# Patient Record
Sex: Female | Born: 1945
Health system: Southern US, Community
[De-identification: ages and names within clinical notes are randomized; demographics above are authoritative.]

## PROBLEM LIST (undated history)

## (undated) DIAGNOSIS — I10 Essential (primary) hypertension: Secondary | ICD-10-CM

## (undated) DIAGNOSIS — Z923 Personal history of irradiation: Secondary | ICD-10-CM

## (undated) DIAGNOSIS — K219 Gastro-esophageal reflux disease without esophagitis: Secondary | ICD-10-CM

## (undated) DIAGNOSIS — T783XXA Angioneurotic edema, initial encounter: Secondary | ICD-10-CM

## (undated) DIAGNOSIS — R7309 Other abnormal glucose: Secondary | ICD-10-CM

## (undated) DIAGNOSIS — I6529 Occlusion and stenosis of unspecified carotid artery: Secondary | ICD-10-CM

## (undated) DIAGNOSIS — C801 Malignant (primary) neoplasm, unspecified: Secondary | ICD-10-CM

## (undated) DIAGNOSIS — I739 Peripheral vascular disease, unspecified: Secondary | ICD-10-CM

## (undated) DIAGNOSIS — Z853 Personal history of malignant neoplasm of breast: Secondary | ICD-10-CM

## (undated) DIAGNOSIS — E785 Hyperlipidemia, unspecified: Secondary | ICD-10-CM

## (undated) DIAGNOSIS — N912 Amenorrhea, unspecified: Secondary | ICD-10-CM

## (undated) DIAGNOSIS — N649 Disorder of breast, unspecified: Secondary | ICD-10-CM

## (undated) DIAGNOSIS — N189 Chronic kidney disease, unspecified: Secondary | ICD-10-CM

## (undated) DIAGNOSIS — Z8679 Personal history of other diseases of the circulatory system: Secondary | ICD-10-CM

## (undated) DIAGNOSIS — J189 Pneumonia, unspecified organism: Secondary | ICD-10-CM

## (undated) DIAGNOSIS — R7303 Prediabetes: Secondary | ICD-10-CM

## (undated) DIAGNOSIS — C50919 Malignant neoplasm of unspecified site of unspecified female breast: Secondary | ICD-10-CM

## (undated) HISTORY — DX: Personal history of malignant neoplasm of breast: Z85.3

## (undated) HISTORY — DX: Other abnormal glucose: R73.09

## (undated) HISTORY — DX: Occlusion and stenosis of unspecified carotid artery: I65.29

## (undated) HISTORY — DX: Disorder of breast, unspecified: N64.9

## (undated) HISTORY — DX: Personal history of other diseases of the circulatory system: Z86.79

## (undated) HISTORY — DX: Amenorrhea, unspecified: N91.2

## (undated) HISTORY — DX: Angioneurotic edema, initial encounter: T78.3XXA

## (undated) HISTORY — DX: Hyperlipidemia, unspecified: E78.5

## (undated) HISTORY — PX: OTHER SURGICAL HISTORY: SHX169

## (undated) HISTORY — DX: Essential (primary) hypertension: I10

---

## 1970-04-09 HISTORY — PX: APPENDECTOMY: SHX54

## 1999-01-25 ENCOUNTER — Other Ambulatory Visit: Admission: RE | Admit: 1999-01-25 | Discharge: 1999-01-25 | Payer: Self-pay | Admitting: Obstetrics & Gynecology

## 2001-02-04 ENCOUNTER — Other Ambulatory Visit: Admission: RE | Admit: 2001-02-04 | Discharge: 2001-02-04 | Payer: Self-pay | Admitting: Obstetrics & Gynecology

## 2002-04-22 ENCOUNTER — Other Ambulatory Visit: Admission: RE | Admit: 2002-04-22 | Discharge: 2002-04-22 | Payer: Self-pay | Admitting: Obstetrics & Gynecology

## 2003-02-15 ENCOUNTER — Ambulatory Visit (HOSPITAL_COMMUNITY): Admission: RE | Admit: 2003-02-15 | Discharge: 2003-02-15 | Payer: Self-pay | Admitting: Gastroenterology

## 2003-04-10 HISTORY — PX: COLONOSCOPY: SHX174

## 2003-07-19 ENCOUNTER — Other Ambulatory Visit: Admission: RE | Admit: 2003-07-19 | Discharge: 2003-07-19 | Payer: Self-pay | Admitting: Obstetrics & Gynecology

## 2004-08-10 ENCOUNTER — Ambulatory Visit: Payer: Self-pay | Admitting: Internal Medicine

## 2004-08-11 ENCOUNTER — Ambulatory Visit: Payer: Self-pay | Admitting: Internal Medicine

## 2004-08-18 ENCOUNTER — Ambulatory Visit: Payer: Self-pay

## 2004-08-22 ENCOUNTER — Ambulatory Visit: Payer: Self-pay

## 2005-02-20 ENCOUNTER — Ambulatory Visit: Payer: Self-pay

## 2005-04-09 DIAGNOSIS — C801 Malignant (primary) neoplasm, unspecified: Secondary | ICD-10-CM

## 2005-04-09 DIAGNOSIS — Z923 Personal history of irradiation: Secondary | ICD-10-CM

## 2005-04-09 HISTORY — DX: Personal history of irradiation: Z92.3

## 2005-04-09 HISTORY — DX: Malignant (primary) neoplasm, unspecified: C80.1

## 2005-04-24 ENCOUNTER — Encounter: Admission: RE | Admit: 2005-04-24 | Discharge: 2005-04-24 | Payer: Self-pay | Admitting: Obstetrics and Gynecology

## 2005-08-21 ENCOUNTER — Ambulatory Visit: Payer: Self-pay

## 2005-11-30 ENCOUNTER — Encounter: Admission: RE | Admit: 2005-11-30 | Discharge: 2005-11-30 | Payer: Self-pay | Admitting: General Surgery

## 2005-11-30 ENCOUNTER — Encounter (INDEPENDENT_AMBULATORY_CARE_PROVIDER_SITE_OTHER): Payer: Self-pay | Admitting: Specialist

## 2005-12-03 ENCOUNTER — Encounter (INDEPENDENT_AMBULATORY_CARE_PROVIDER_SITE_OTHER): Payer: Self-pay | Admitting: *Deleted

## 2005-12-03 ENCOUNTER — Encounter (INDEPENDENT_AMBULATORY_CARE_PROVIDER_SITE_OTHER): Payer: Self-pay | Admitting: Diagnostic Radiology

## 2005-12-03 ENCOUNTER — Encounter: Admission: RE | Admit: 2005-12-03 | Discharge: 2005-12-03 | Payer: Self-pay | Admitting: General Surgery

## 2005-12-06 HISTORY — PX: BREAST BIOPSY: SHX20

## 2005-12-12 ENCOUNTER — Encounter: Admission: RE | Admit: 2005-12-12 | Discharge: 2005-12-12 | Payer: Self-pay | Admitting: General Surgery

## 2005-12-19 ENCOUNTER — Encounter: Admission: RE | Admit: 2005-12-19 | Discharge: 2005-12-19 | Payer: Self-pay | Admitting: General Surgery

## 2005-12-20 ENCOUNTER — Encounter: Admission: RE | Admit: 2005-12-20 | Discharge: 2005-12-20 | Payer: Self-pay | Admitting: General Surgery

## 2005-12-20 ENCOUNTER — Encounter (INDEPENDENT_AMBULATORY_CARE_PROVIDER_SITE_OTHER): Payer: Self-pay | Admitting: Specialist

## 2005-12-24 ENCOUNTER — Encounter (INDEPENDENT_AMBULATORY_CARE_PROVIDER_SITE_OTHER): Payer: Self-pay | Admitting: Specialist

## 2005-12-24 ENCOUNTER — Ambulatory Visit (HOSPITAL_BASED_OUTPATIENT_CLINIC_OR_DEPARTMENT_OTHER): Admission: RE | Admit: 2005-12-24 | Discharge: 2005-12-24 | Payer: Self-pay | Admitting: General Surgery

## 2005-12-24 ENCOUNTER — Encounter: Admission: RE | Admit: 2005-12-24 | Discharge: 2005-12-24 | Payer: Self-pay | Admitting: General Surgery

## 2005-12-26 ENCOUNTER — Ambulatory Visit: Payer: Self-pay | Admitting: Oncology

## 2006-01-04 ENCOUNTER — Ambulatory Visit: Admission: RE | Admit: 2006-01-04 | Discharge: 2006-04-04 | Payer: Self-pay | Admitting: Radiation Oncology

## 2006-01-08 HISTORY — PX: BREAST LUMPECTOMY: SHX2

## 2006-02-11 ENCOUNTER — Encounter: Admission: RE | Admit: 2006-02-11 | Discharge: 2006-02-11 | Payer: Self-pay | Admitting: Oncology

## 2006-02-19 ENCOUNTER — Ambulatory Visit: Payer: Self-pay

## 2006-02-20 ENCOUNTER — Ambulatory Visit: Payer: Self-pay | Admitting: Oncology

## 2006-02-25 LAB — CBC WITH DIFFERENTIAL/PLATELET
Basophils Absolute: 0.1 10*3/uL (ref 0.0–0.1)
EOS%: 1.1 % (ref 0.0–7.0)
HGB: 11.8 g/dL (ref 11.6–15.9)
LYMPH%: 46.2 % (ref 14.0–48.0)
MCH: 27.6 pg (ref 26.0–34.0)
MCV: 83.8 fL (ref 81.0–101.0)
MONO%: 5.2 % (ref 0.0–13.0)
RBC: 4.28 10*6/uL (ref 3.70–5.32)
RDW: 14.6 % — ABNORMAL HIGH (ref 11.3–14.5)

## 2006-03-08 LAB — COMPREHENSIVE METABOLIC PANEL
AST: 12 U/L (ref 0–37)
Albumin: 4.6 g/dL (ref 3.5–5.2)
Alkaline Phosphatase: 96 U/L (ref 39–117)
BUN: 13 mg/dL (ref 6–23)
Potassium: 4.1 mEq/L (ref 3.5–5.3)
Total Bilirubin: 0.3 mg/dL (ref 0.3–1.2)

## 2006-03-08 LAB — CANCER ANTIGEN 27.29: CA 27.29: 10 U/mL (ref 0–39)

## 2006-03-08 LAB — FOLLICLE STIMULATING HORMONE: FSH: 37.6 m[IU]/mL

## 2006-04-05 ENCOUNTER — Ambulatory Visit: Admission: RE | Admit: 2006-04-05 | Discharge: 2006-07-04 | Payer: Self-pay | Admitting: Radiation Oncology

## 2006-04-25 ENCOUNTER — Ambulatory Visit: Payer: Self-pay | Admitting: Oncology

## 2006-05-03 ENCOUNTER — Ambulatory Visit: Payer: Self-pay | Admitting: Internal Medicine

## 2006-05-30 ENCOUNTER — Ambulatory Visit: Payer: Self-pay | Admitting: Internal Medicine

## 2006-06-17 ENCOUNTER — Ambulatory Visit: Payer: Self-pay | Admitting: Oncology

## 2006-06-20 LAB — CBC WITH DIFFERENTIAL/PLATELET
Basophils Absolute: 0 10*3/uL (ref 0.0–0.1)
EOS%: 0.8 % (ref 0.0–7.0)
Eosinophils Absolute: 0.1 10*3/uL (ref 0.0–0.5)
LYMPH%: 29.8 % (ref 14.0–48.0)
MCH: 28.4 pg (ref 26.0–34.0)
MCV: 83.2 fL (ref 81.0–101.0)
MONO%: 6.1 % (ref 0.0–13.0)
NEUT#: 4.8 10*3/uL (ref 1.5–6.5)
Platelets: 292 10*3/uL (ref 145–400)
RBC: 4.48 10*6/uL (ref 3.70–5.32)

## 2006-06-20 LAB — COMPREHENSIVE METABOLIC PANEL
AST: 15 U/L (ref 0–37)
Alkaline Phosphatase: 96 U/L (ref 39–117)
BUN: 17 mg/dL (ref 6–23)
Glucose, Bld: 101 mg/dL — ABNORMAL HIGH (ref 70–99)
Sodium: 140 mEq/L (ref 135–145)
Total Bilirubin: 0.4 mg/dL (ref 0.3–1.2)

## 2006-08-08 ENCOUNTER — Ambulatory Visit: Payer: Self-pay

## 2006-09-10 ENCOUNTER — Ambulatory Visit: Payer: Self-pay | Admitting: Oncology

## 2006-09-12 LAB — COMPREHENSIVE METABOLIC PANEL
AST: 12 U/L (ref 0–37)
Albumin: 4.6 g/dL (ref 3.5–5.2)
Alkaline Phosphatase: 94 U/L (ref 39–117)
Potassium: 4.1 mEq/L (ref 3.5–5.3)
Sodium: 139 mEq/L (ref 135–145)
Total Protein: 8.1 g/dL (ref 6.0–8.3)

## 2006-09-12 LAB — CBC WITH DIFFERENTIAL/PLATELET
BASO%: 2.1 % — ABNORMAL HIGH (ref 0.0–2.0)
EOS%: 1.3 % (ref 0.0–7.0)
MCH: 28.7 pg (ref 26.0–34.0)
MCHC: 33.8 g/dL (ref 32.0–36.0)
MCV: 84.7 fL (ref 81.0–101.0)
MONO%: 7 % (ref 0.0–13.0)
NEUT#: 3.6 10*3/uL (ref 1.5–6.5)
RBC: 4.33 10*6/uL (ref 3.70–5.32)
RDW: 13.3 % (ref 11.3–14.5)

## 2006-09-19 ENCOUNTER — Encounter: Payer: Self-pay | Admitting: Internal Medicine

## 2006-11-25 ENCOUNTER — Encounter: Admission: RE | Admit: 2006-11-25 | Discharge: 2006-11-25 | Payer: Self-pay | Admitting: Radiation Oncology

## 2007-02-21 DIAGNOSIS — N912 Amenorrhea, unspecified: Secondary | ICD-10-CM | POA: Insufficient documentation

## 2007-02-21 DIAGNOSIS — Z8679 Personal history of other diseases of the circulatory system: Secondary | ICD-10-CM | POA: Insufficient documentation

## 2007-02-25 ENCOUNTER — Ambulatory Visit: Payer: Self-pay | Admitting: Internal Medicine

## 2007-02-25 DIAGNOSIS — Z853 Personal history of malignant neoplasm of breast: Secondary | ICD-10-CM | POA: Insufficient documentation

## 2007-02-25 LAB — CONVERTED CEMR LAB: HDL goal, serum: 40 mg/dL

## 2007-03-05 ENCOUNTER — Ambulatory Visit: Payer: Self-pay | Admitting: Oncology

## 2007-03-11 ENCOUNTER — Encounter: Payer: Self-pay | Admitting: Internal Medicine

## 2007-03-11 LAB — CBC WITH DIFFERENTIAL/PLATELET
Basophils Absolute: 0 10*3/uL (ref 0.0–0.1)
EOS%: 0.8 % (ref 0.0–7.0)
Eosinophils Absolute: 0.1 10*3/uL (ref 0.0–0.5)
HGB: 12.6 g/dL (ref 11.6–15.9)
LYMPH%: 40.8 % (ref 14.0–48.0)
MCH: 28.8 pg (ref 26.0–34.0)
MCV: 84.3 fL (ref 81.0–101.0)
MONO%: 4.7 % (ref 0.0–13.0)
NEUT#: 4.2 10*3/uL (ref 1.5–6.5)
Platelets: 248 10*3/uL (ref 145–400)
RBC: 4.36 10*6/uL (ref 3.70–5.32)

## 2007-03-11 LAB — COMPREHENSIVE METABOLIC PANEL
Alkaline Phosphatase: 102 U/L (ref 39–117)
BUN: 18 mg/dL (ref 6–23)
Glucose, Bld: 96 mg/dL (ref 70–99)
Total Bilirubin: 0.4 mg/dL (ref 0.3–1.2)

## 2007-03-17 ENCOUNTER — Encounter: Payer: Self-pay | Admitting: Internal Medicine

## 2007-03-17 LAB — CONVERTED CEMR LAB
BUN: 18 mg/dL (ref 6–23)
HDL: 62 mg/dL (ref >39)
LDL Cholesterol: 136 mg/dL — ABNORMAL HIGH (ref 0–99)
Total CHOL/HDL Ratio: 3.5
VLDL: 22 mg/dL (ref 0–40)

## 2007-03-20 LAB — ESTRADIOL, ULTRA SENS: Estradiol, Ultra Sensitive: 12 pg/mL

## 2007-03-31 ENCOUNTER — Telehealth (INDEPENDENT_AMBULATORY_CARE_PROVIDER_SITE_OTHER): Payer: Self-pay | Admitting: *Deleted

## 2007-06-03 ENCOUNTER — Ambulatory Visit: Payer: Self-pay | Admitting: Internal Medicine

## 2007-06-07 LAB — CONVERTED CEMR LAB
AST: 16 units/L (ref 0–37)
Cholesterol: 135 mg/dL (ref 0–200)
HDL: 52.3 mg/dL (ref >39.0)
LDL Cholesterol: 63 mg/dL (ref 0–99)

## 2007-06-10 ENCOUNTER — Encounter (INDEPENDENT_AMBULATORY_CARE_PROVIDER_SITE_OTHER): Payer: Self-pay | Admitting: *Deleted

## 2007-06-12 ENCOUNTER — Ambulatory Visit: Payer: Self-pay | Admitting: Internal Medicine

## 2007-06-12 DIAGNOSIS — I10 Essential (primary) hypertension: Secondary | ICD-10-CM | POA: Insufficient documentation

## 2007-06-12 LAB — CONVERTED CEMR LAB
Blood in Urine, dipstick: NEGATIVE
Glucose, Urine, Semiquant: NEGATIVE
Nitrite: NEGATIVE
Protein, U semiquant: 30
Specific Gravity, Urine: 1.015

## 2007-06-13 ENCOUNTER — Encounter: Payer: Self-pay | Admitting: Internal Medicine

## 2007-06-16 ENCOUNTER — Encounter (INDEPENDENT_AMBULATORY_CARE_PROVIDER_SITE_OTHER): Payer: Self-pay | Admitting: *Deleted

## 2007-06-17 ENCOUNTER — Ambulatory Visit: Payer: Self-pay | Admitting: Internal Medicine

## 2007-06-17 DIAGNOSIS — I6529 Occlusion and stenosis of unspecified carotid artery: Secondary | ICD-10-CM | POA: Insufficient documentation

## 2007-06-18 ENCOUNTER — Encounter (INDEPENDENT_AMBULATORY_CARE_PROVIDER_SITE_OTHER): Payer: Self-pay | Admitting: *Deleted

## 2007-06-27 ENCOUNTER — Ambulatory Visit: Payer: Self-pay | Admitting: Cardiovascular Disease

## 2007-07-02 ENCOUNTER — Ambulatory Visit: Payer: Self-pay

## 2007-08-08 ENCOUNTER — Ambulatory Visit: Payer: Self-pay | Admitting: Oncology

## 2007-08-12 LAB — CBC WITH DIFFERENTIAL/PLATELET
Basophils Absolute: 0 10*3/uL (ref 0.0–0.1)
Eosinophils Absolute: 0.1 10*3/uL (ref 0.0–0.5)
HCT: 34.8 % (ref 34.8–46.6)
HGB: 11.8 g/dL (ref 11.6–15.9)
LYMPH%: 32.1 % (ref 14.0–48.0)
MCV: 84.4 fL (ref 81.0–101.0)
MONO%: 5 % (ref 0.0–13.0)
NEUT#: 5.1 10*3/uL (ref 1.5–6.5)
NEUT%: 61.2 % (ref 39.6–76.8)
Platelets: 254 10*3/uL (ref 145–400)

## 2007-08-13 LAB — COMPREHENSIVE METABOLIC PANEL
ALT: 9 U/L (ref 0–35)
CO2: 27 mEq/L (ref 19–32)
Calcium: 10 mg/dL (ref 8.4–10.5)
Chloride: 104 mEq/L (ref 96–112)
Sodium: 143 mEq/L (ref 135–145)
Total Protein: 7.6 g/dL (ref 6.0–8.3)

## 2007-08-13 LAB — CANCER ANTIGEN 27.29: CA 27.29: 12 U/mL (ref 0–39)

## 2007-08-14 ENCOUNTER — Ambulatory Visit (HOSPITAL_COMMUNITY): Admission: RE | Admit: 2007-08-14 | Discharge: 2007-08-14 | Payer: Self-pay | Admitting: Oncology

## 2007-10-28 ENCOUNTER — Ambulatory Visit: Payer: Self-pay | Admitting: Internal Medicine

## 2007-10-28 LAB — CONVERTED CEMR LAB: Specific Gravity, Urine: 1.015

## 2007-10-31 ENCOUNTER — Telehealth (INDEPENDENT_AMBULATORY_CARE_PROVIDER_SITE_OTHER): Payer: Self-pay | Admitting: *Deleted

## 2007-11-26 ENCOUNTER — Encounter: Admission: RE | Admit: 2007-11-26 | Discharge: 2007-11-26 | Payer: Self-pay | Admitting: General Surgery

## 2007-12-14 IMAGING — MG MM BREAST STEREO BIOPSY*L*
2 series · 2 of 2 positions shown · non-contrast
Comparison: none

STEREO BIOPSY *L* BREAST

LEFT BREAST STEREOTACTIC BIOPSY:
CLINICAL DATA: Left breast mass.

[L CC]
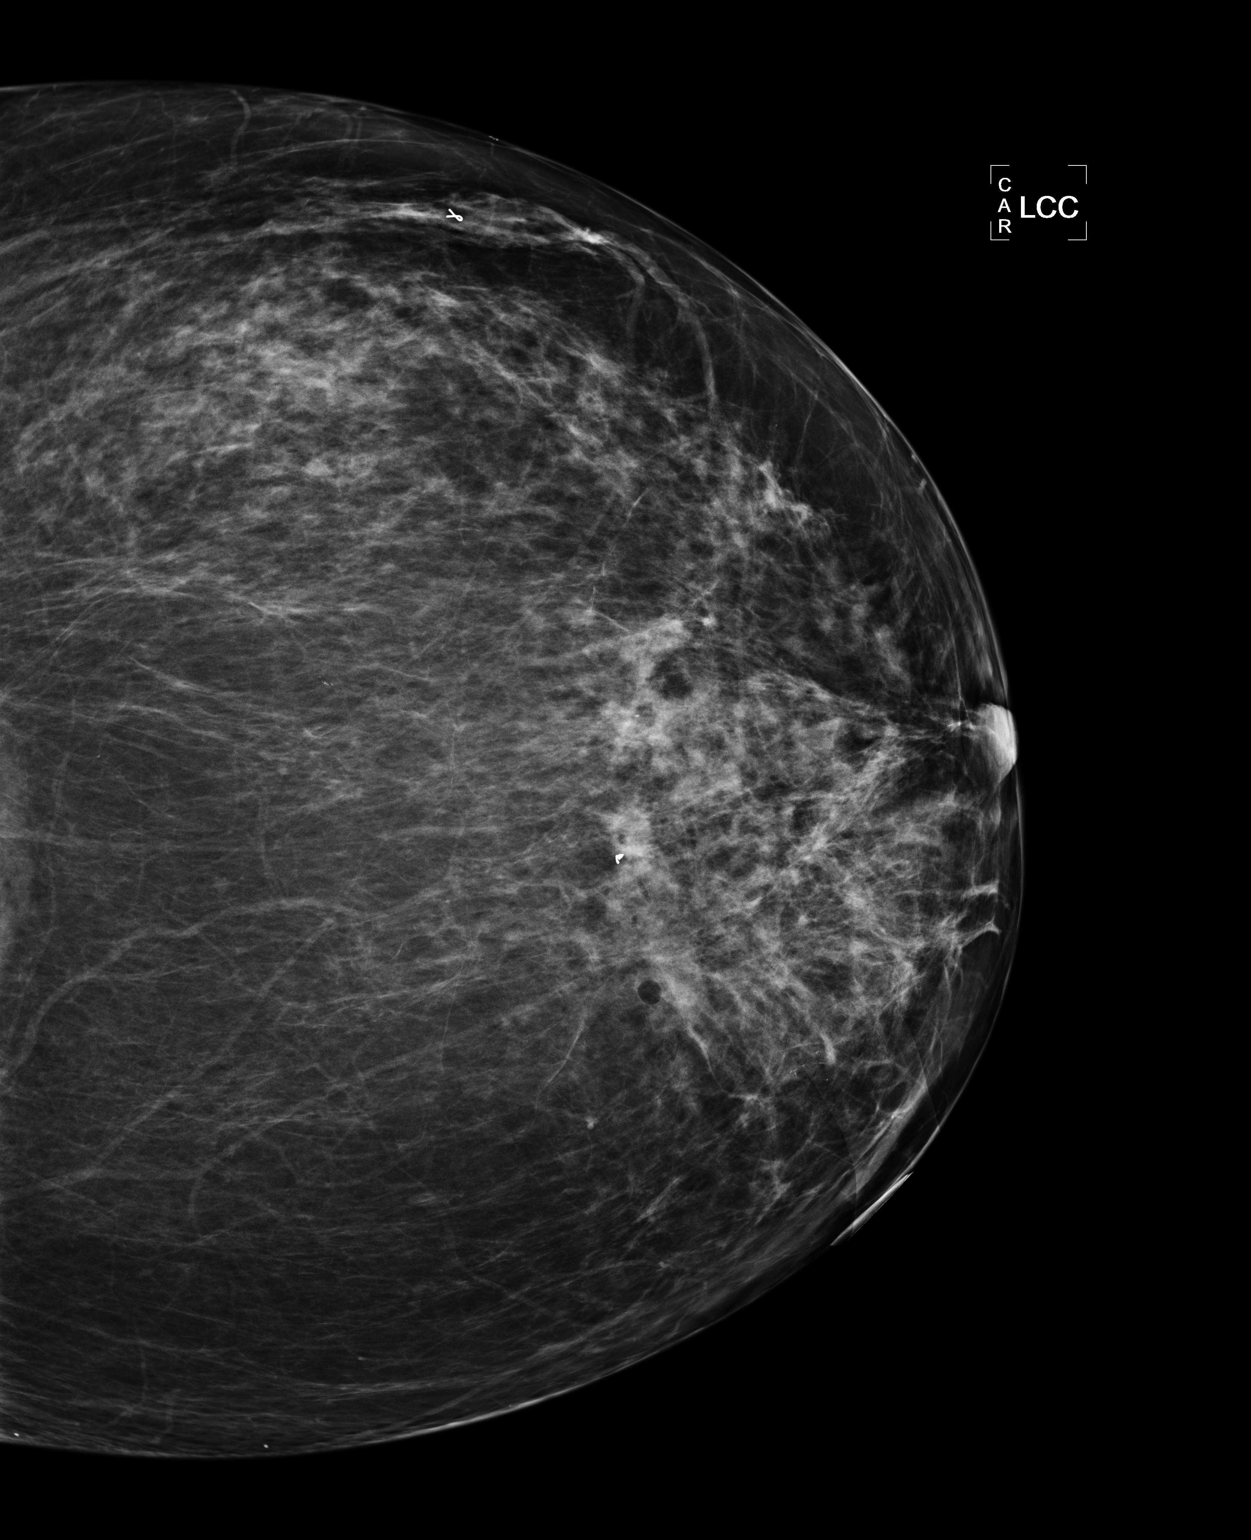

[L ML]
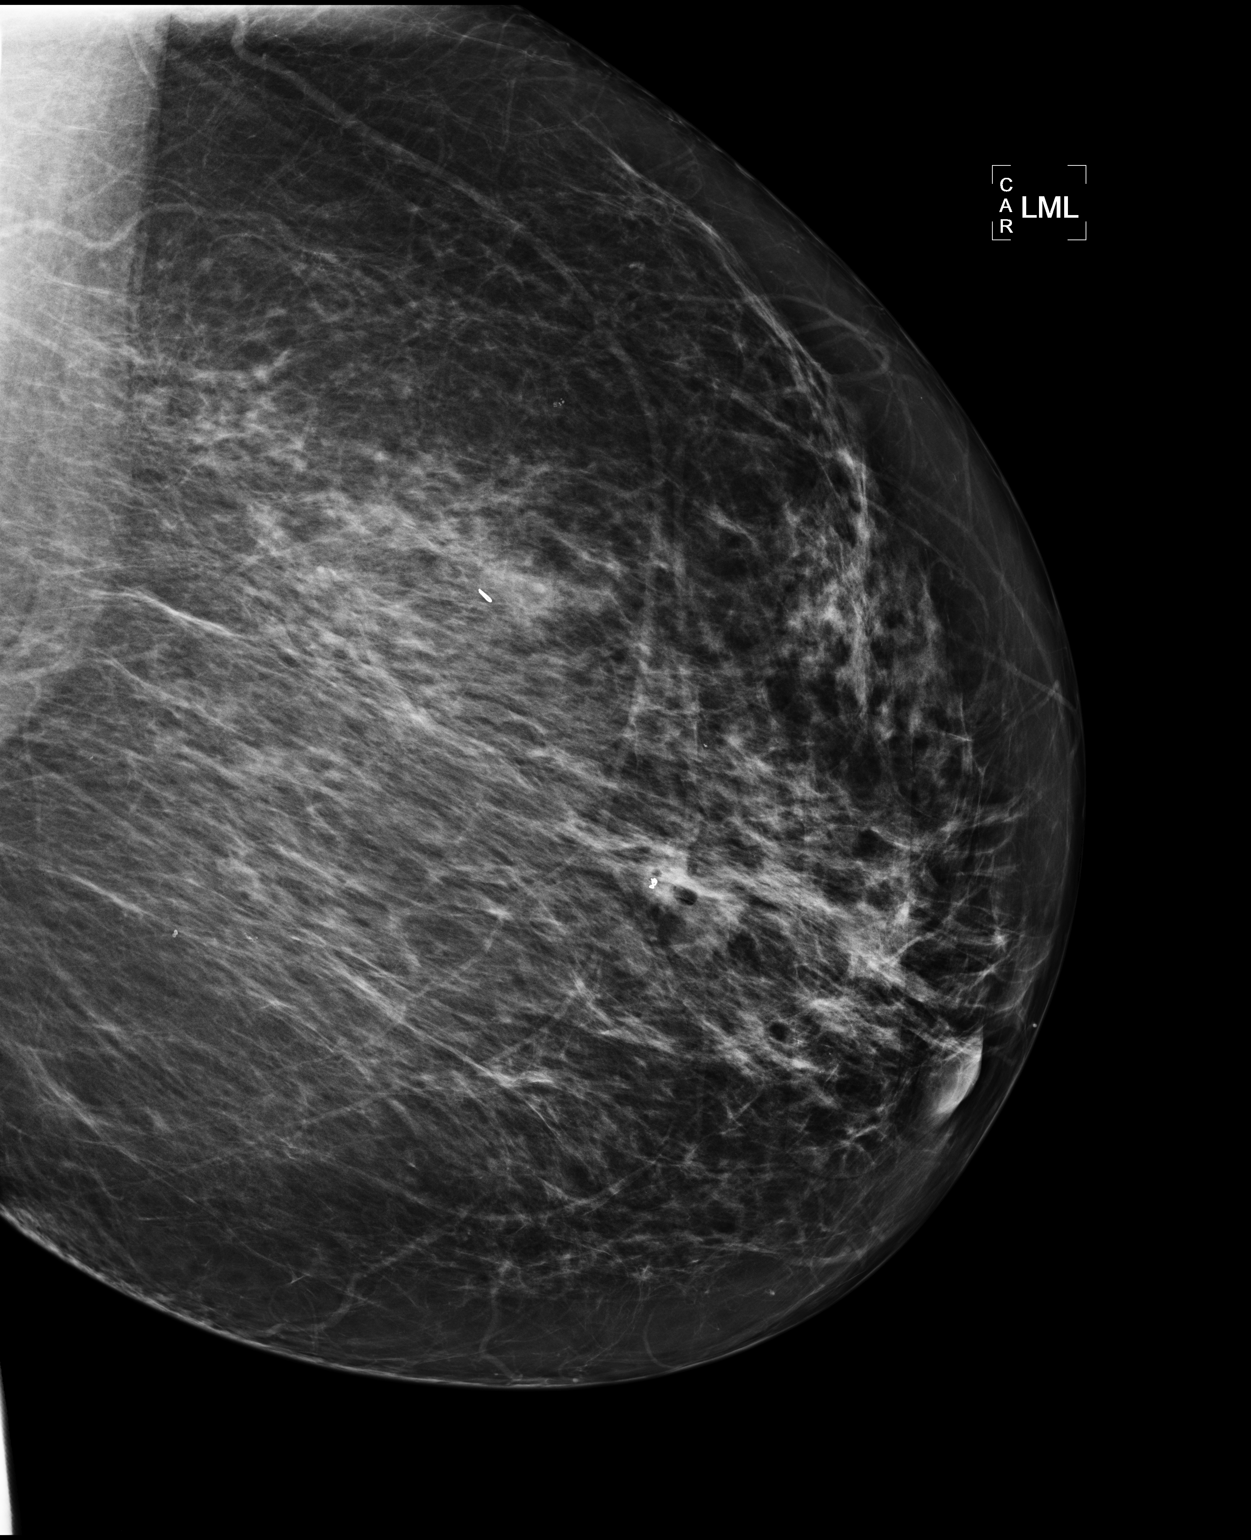

[2 of 2 positions shown; findings below may reference images not displayed]

Left breast stereotactic biopsy was described to the patient.  Alternatives and complications 
discussed, questions answered and verbal/written consent granted.  Sterile technique was utilized 
during the procedure.  10 cc of 2% Lidocaine was used for local anesthesia.  10 cc of 1% Lidocaine 
mixed with epinephrine was used for deeper anesthetic.  Using mammographic guidance the mass in 
approximately the 12 o'clock region of the left breast was  localized.  Using an 11-gauge 
vacuum-assisted core biopsy needle six tissue samples were obtained through this area.

Titanium clip was deployed for future localization purposes.  Post biopsy mammogram was obtained.  
No complications arose during the procedure.  The patient left our facility in good health.  She 
was provided with my business card should questions or concerns arise.
IMPRESSION: Left breast stereotactic biopsy, as described.

PATHOLOGY RESULTS: MALIGNANT
Malignant invasive mammary  carcinoma.

Pathology results dictated by Dr. Joemaries - 12/05/05.

Pathologic diagnosis reveals invasive mammary carcinoma most likely representing invasive ductal 
carcinoma of at least intermediate grade.  This is concordant with the imaging findings.  Results 
were discussed with the patient by telephone on 12/05/05 at [DATE] a.m.  She reports that she had some
bleeding yesterday but has no problems today.  Surgical consultation is arranged with Dr. Julian 
Morcos for today, 12/05/05 at [DATE] p.m.  Breast MRI may be helpful.

Surgical consultation of the left breast.
,

## 2008-01-08 ENCOUNTER — Telehealth (INDEPENDENT_AMBULATORY_CARE_PROVIDER_SITE_OTHER): Payer: Self-pay | Admitting: *Deleted

## 2008-01-09 ENCOUNTER — Ambulatory Visit: Payer: Self-pay | Admitting: Internal Medicine

## 2008-01-09 DIAGNOSIS — T783XXA Angioneurotic edema, initial encounter: Secondary | ICD-10-CM | POA: Insufficient documentation

## 2008-01-12 ENCOUNTER — Ambulatory Visit: Payer: Self-pay | Admitting: Internal Medicine

## 2008-01-22 ENCOUNTER — Telehealth (INDEPENDENT_AMBULATORY_CARE_PROVIDER_SITE_OTHER): Payer: Self-pay | Admitting: *Deleted

## 2008-02-10 ENCOUNTER — Telehealth (INDEPENDENT_AMBULATORY_CARE_PROVIDER_SITE_OTHER): Payer: Self-pay | Admitting: *Deleted

## 2008-02-16 ENCOUNTER — Encounter: Admission: RE | Admit: 2008-02-16 | Discharge: 2008-02-16 | Payer: Self-pay | Admitting: Oncology

## 2008-07-28 ENCOUNTER — Ambulatory Visit: Payer: Self-pay

## 2008-08-09 ENCOUNTER — Ambulatory Visit: Payer: Self-pay | Admitting: Oncology

## 2008-08-11 LAB — CBC WITH DIFFERENTIAL/PLATELET
EOS%: 1 % (ref 0.0–7.0)
Eosinophils Absolute: 0.1 10*3/uL (ref 0.0–0.5)
MCH: 28.2 pg (ref 25.1–34.0)
MCV: 84.5 fL (ref 79.5–101.0)
MONO%: 3.9 % (ref 0.0–14.0)
NEUT#: 4.1 10*3/uL (ref 1.5–6.5)
RBC: 4.26 10*6/uL (ref 3.70–5.45)
RDW: 13 % (ref 11.2–14.5)
lymph#: 3.8 10*3/uL — ABNORMAL HIGH (ref 0.9–3.3)
nRBC: 0 % (ref 0–0)

## 2008-08-12 LAB — COMPREHENSIVE METABOLIC PANEL
ALT: 8 U/L (ref 0–35)
Alkaline Phosphatase: 71 U/L (ref 39–117)
Potassium: 4 mEq/L (ref 3.5–5.3)
Sodium: 138 mEq/L (ref 135–145)
Total Bilirubin: 0.4 mg/dL (ref 0.3–1.2)
Total Protein: 7.3 g/dL (ref 6.0–8.3)

## 2008-08-19 ENCOUNTER — Encounter: Payer: Self-pay | Admitting: Internal Medicine

## 2008-11-10 ENCOUNTER — Encounter: Admission: RE | Admit: 2008-11-10 | Discharge: 2008-11-10 | Payer: Self-pay | Admitting: Internal Medicine

## 2008-11-10 ENCOUNTER — Ambulatory Visit: Payer: Self-pay | Admitting: Internal Medicine

## 2008-11-10 DIAGNOSIS — R739 Hyperglycemia, unspecified: Secondary | ICD-10-CM | POA: Insufficient documentation

## 2008-11-10 DIAGNOSIS — E7800 Pure hypercholesterolemia, unspecified: Secondary | ICD-10-CM | POA: Insufficient documentation

## 2008-11-10 DIAGNOSIS — E785 Hyperlipidemia, unspecified: Secondary | ICD-10-CM | POA: Insufficient documentation

## 2008-11-15 ENCOUNTER — Encounter (INDEPENDENT_AMBULATORY_CARE_PROVIDER_SITE_OTHER): Payer: Self-pay | Admitting: *Deleted

## 2008-11-15 LAB — CONVERTED CEMR LAB
Cholesterol: 188 mg/dL (ref 0–200)
LDL Cholesterol: 100 mg/dL — ABNORMAL HIGH (ref 0–99)
Triglycerides: 186 mg/dL — ABNORMAL HIGH (ref 0.0–149.0)

## 2008-11-26 ENCOUNTER — Encounter: Admission: RE | Admit: 2008-11-26 | Discharge: 2008-11-26 | Payer: Self-pay | Admitting: Oncology

## 2008-11-29 ENCOUNTER — Ambulatory Visit: Payer: Self-pay | Admitting: Cardiovascular Disease

## 2008-12-23 ENCOUNTER — Ambulatory Visit: Payer: Self-pay | Admitting: Cardiovascular Disease

## 2008-12-29 ENCOUNTER — Encounter (INDEPENDENT_AMBULATORY_CARE_PROVIDER_SITE_OTHER): Payer: Self-pay

## 2008-12-29 LAB — CONVERTED CEMR LAB
BUN: 18 mg/dL (ref 6–23)
Calcium: 9 mg/dL (ref 8.4–10.5)
Creatinine, Ser: 1.2 mg/dL (ref 0.4–1.2)
GFR calc non Af Amer: 58.63 mL/min (ref >60)
Glucose, Bld: 98 mg/dL (ref 70–99)
Potassium: 3.7 meq/L (ref 3.5–5.1)

## 2009-01-19 ENCOUNTER — Ambulatory Visit: Payer: Self-pay | Admitting: Oncology

## 2009-01-20 ENCOUNTER — Telehealth (INDEPENDENT_AMBULATORY_CARE_PROVIDER_SITE_OTHER): Payer: Self-pay | Admitting: *Deleted

## 2009-01-21 ENCOUNTER — Telehealth (INDEPENDENT_AMBULATORY_CARE_PROVIDER_SITE_OTHER): Payer: Self-pay | Admitting: *Deleted

## 2009-01-21 ENCOUNTER — Ambulatory Visit: Payer: Self-pay | Admitting: Family Medicine

## 2009-01-24 ENCOUNTER — Encounter: Payer: Self-pay | Admitting: Internal Medicine

## 2009-01-26 ENCOUNTER — Encounter: Payer: Self-pay | Admitting: Family Medicine

## 2009-01-27 ENCOUNTER — Telehealth (INDEPENDENT_AMBULATORY_CARE_PROVIDER_SITE_OTHER): Payer: Self-pay | Admitting: *Deleted

## 2009-02-22 ENCOUNTER — Encounter: Payer: Self-pay | Admitting: Internal Medicine

## 2009-05-31 ENCOUNTER — Telehealth (INDEPENDENT_AMBULATORY_CARE_PROVIDER_SITE_OTHER): Payer: Self-pay | Admitting: *Deleted

## 2009-06-14 ENCOUNTER — Ambulatory Visit: Payer: Self-pay | Admitting: Internal Medicine

## 2009-06-14 DIAGNOSIS — F17210 Nicotine dependence, cigarettes, uncomplicated: Secondary | ICD-10-CM | POA: Insufficient documentation

## 2009-06-14 DIAGNOSIS — Z87891 Personal history of nicotine dependence: Secondary | ICD-10-CM | POA: Insufficient documentation

## 2009-06-14 DIAGNOSIS — F17200 Nicotine dependence, unspecified, uncomplicated: Secondary | ICD-10-CM | POA: Insufficient documentation

## 2009-06-14 DIAGNOSIS — M25579 Pain in unspecified ankle and joints of unspecified foot: Secondary | ICD-10-CM | POA: Insufficient documentation

## 2009-06-23 LAB — CONVERTED CEMR LAB
ALT: 11 units/L (ref 0–35)
AST: 16 units/L (ref 0–37)
BUN: 10 mg/dL (ref 6–23)
CO2: 30 meq/L (ref 19–32)
Chloride: 104 meq/L (ref 96–112)
Cholesterol: 210 mg/dL — ABNORMAL HIGH (ref 0–200)
Potassium: 3.4 meq/L — ABNORMAL LOW (ref 3.5–5.1)
TSH: 1.36 microintl units/mL (ref 0.35–5.50)
Total Bilirubin: 0.3 mg/dL (ref 0.3–1.2)
Total Protein: 7.9 g/dL (ref 6.0–8.3)
VLDL: 31.6 mg/dL (ref 0.0–40.0)

## 2009-08-22 ENCOUNTER — Ambulatory Visit: Payer: Self-pay | Admitting: Oncology

## 2009-08-23 ENCOUNTER — Encounter: Payer: Self-pay | Admitting: Internal Medicine

## 2009-08-23 LAB — COMPREHENSIVE METABOLIC PANEL
ALT: 13 U/L (ref 0–35)
Albumin: 3.9 g/dL (ref 3.5–5.2)
Alkaline Phosphatase: 58 U/L (ref 39–117)
CO2: 25 mEq/L (ref 19–32)
Glucose, Bld: 107 mg/dL — ABNORMAL HIGH (ref 70–99)
Potassium: 3.5 mEq/L (ref 3.5–5.3)
Sodium: 139 mEq/L (ref 135–145)
Total Bilirubin: 0.4 mg/dL (ref 0.3–1.2)
Total Protein: 7.9 g/dL (ref 6.0–8.3)

## 2009-08-23 LAB — CBC WITH DIFFERENTIAL/PLATELET
Basophils Absolute: 0 10*3/uL (ref 0.0–0.1)
EOS%: 1 % (ref 0.0–7.0)
Eosinophils Absolute: 0.1 10*3/uL (ref 0.0–0.5)
HGB: 12.5 g/dL (ref 11.6–15.9)
MONO%: 4.1 % (ref 0.0–14.0)
NEUT#: 4.4 10*3/uL (ref 1.5–6.5)
RBC: 4.42 10*6/uL (ref 3.70–5.45)
RDW: 12.9 % (ref 11.2–14.5)
lymph#: 4.1 10*3/uL — ABNORMAL HIGH (ref 0.9–3.3)
nRBC: 0 % (ref 0–0)

## 2009-08-24 LAB — VITAMIN D 25 HYDROXY (VIT D DEFICIENCY, FRACTURES): Vit D, 25-Hydroxy: 18 ng/mL — ABNORMAL LOW (ref 30–89)

## 2009-11-28 ENCOUNTER — Encounter: Admission: RE | Admit: 2009-11-28 | Discharge: 2009-11-28 | Payer: Self-pay | Admitting: Oncology

## 2010-01-05 ENCOUNTER — Ambulatory Visit: Payer: Self-pay | Admitting: Cardiovascular Disease

## 2010-01-18 ENCOUNTER — Ambulatory Visit: Payer: Self-pay

## 2010-01-18 ENCOUNTER — Ambulatory Visit: Payer: Self-pay | Admitting: Cardiovascular Disease

## 2010-02-13 ENCOUNTER — Telehealth: Payer: Self-pay | Admitting: Cardiovascular Disease

## 2010-02-16 ENCOUNTER — Encounter: Admission: RE | Admit: 2010-02-16 | Discharge: 2010-02-16 | Payer: Self-pay | Admitting: Oncology

## 2010-02-21 ENCOUNTER — Ambulatory Visit: Payer: Self-pay | Admitting: Oncology

## 2010-02-21 ENCOUNTER — Ambulatory Visit: Payer: Self-pay | Admitting: Cardiovascular Disease

## 2010-03-09 ENCOUNTER — Encounter: Payer: Self-pay | Admitting: Internal Medicine

## 2010-03-27 ENCOUNTER — Ambulatory Visit: Payer: Self-pay | Admitting: Cardiovascular Disease

## 2010-04-21 ENCOUNTER — Ambulatory Visit
Admission: RE | Admit: 2010-04-21 | Discharge: 2010-04-21 | Payer: Self-pay | Source: Home / Self Care | Attending: Internal Medicine | Admitting: Internal Medicine

## 2010-04-21 DIAGNOSIS — J019 Acute sinusitis, unspecified: Secondary | ICD-10-CM | POA: Insufficient documentation

## 2010-04-29 ENCOUNTER — Other Ambulatory Visit: Payer: Self-pay | Admitting: Oncology

## 2010-04-29 DIAGNOSIS — Z9889 Other specified postprocedural states: Secondary | ICD-10-CM

## 2010-05-01 ENCOUNTER — Encounter: Payer: Self-pay | Admitting: Internal Medicine

## 2010-05-05 ENCOUNTER — Ambulatory Visit
Admission: RE | Admit: 2010-05-05 | Discharge: 2010-05-05 | Payer: Self-pay | Source: Home / Self Care | Attending: Internal Medicine | Admitting: Internal Medicine

## 2010-05-05 ENCOUNTER — Other Ambulatory Visit: Payer: Self-pay | Admitting: Internal Medicine

## 2010-05-05 DIAGNOSIS — M79609 Pain in unspecified limb: Secondary | ICD-10-CM | POA: Insufficient documentation

## 2010-05-05 LAB — URIC ACID: Uric Acid, Serum: 7.3 mg/dL — ABNORMAL HIGH (ref 2.4–7.0)

## 2010-05-07 LAB — CONVERTED CEMR LAB
ALT: 18 units/L (ref 0–40)
AST: 16 units/L (ref 0–37)
BUN: 19 mg/dL (ref 6–23)
BUN: 9 mg/dL (ref 6–23)
Chloride: 103 meq/L (ref 96–112)
Creatinine, Ser: 1.1 mg/dL (ref 0.4–1.2)
GFR calc non Af Amer: 66.69 mL/min (ref >60)
Glucose, Bld: 114 mg/dL — ABNORMAL HIGH (ref 70–99)
HDL: 49.4 mg/dL (ref >39.0)
Hgb A1c MFr Bld: 6.1 % — ABNORMAL HIGH (ref 4.6–6.0)
Potassium: 4.5 meq/L (ref 3.5–5.1)

## 2010-05-09 NOTE — Letter (Signed)
[facility]   Imported By: Lanelle Bal 09/23/2009 11:27:17  _____________________________________________________________________  External Attachment:    Type:   Image     Comment:   External Document

## 2010-05-09 NOTE — Progress Notes (Signed)
Summary: Itching from medications  Phone Note Call from Patient Call back at Home Phone [phone] Call back at [phone]   Caller: Patient Summary of Call: Pt taking Pravastatin and Spironolactone and is itching and don't know which one is making her itch Initial call taken by: Judie Grieve,  February 13, 2010 1:11 PM  Follow-up for Phone Call        02/13/10--1400pm--pt calling stating having itching on feet bottoms,hands, and all over body--states may be pravastatin or spironolactone --advised--stop each med, one at a time, wait for 3 days and see if d/cing either one stops itching--in meantime take benadryl 25mg  q 4-6 hours as needed--pt agrees--also advised to call us back and let us know which one of meds is causing the itching Follow-up by: Ledon Snare, RN,  February 13, 2010 1:56 PM     Appended Document: Itching from medications Will need followup as BP has been very high and if stopping aldactone will likely worsen  Appended Document: Itching from medications 11/10/111415 pm--spoke to ms Doering who states she has been off pravastatin and spironolactone x1 week for itching--she will restart pravastatin this weekend to see if that med was causing itching--if no itching she will restart spironolactone --at this time itching is gone--she will also come in next tues for a BP check per dr Sharay Bellissimo--nt

## 2010-05-09 NOTE — Assessment & Plan Note (Signed)
Summary: CPX with fasting labs/scm   Vital Signs:  Patient profile:   65 year old female Height:      64 inches Weight:      204.8 pounds Temp:     98.5 degrees F oral Pulse rate:   72 / minute Resp:     16 per minute BP sitting:   140 / 78  (left arm) Cuff size:   large  Vitals Entered By: Shonna Chock (June 14, 2009 11:25 AM) CC: CPX/fasting labs and discuss pain med. *EKG completed by cardiologist Dr.Cooper, Hypertension Management Comments REVIEWED MED LIST, PATIENT AGREED DOSE AND INSTRUCTION CORRECT    Primary Care Provider:  Marga Melnick MD  CC:  CPX/fasting labs and discuss pain med. *EKG completed by cardiologist Dr.Cooper and Hypertension Management.  History of Present Illness: She sees Dr Darnelle Catalan annually now; last visit 01/24/2009 reviewed. F/U in 08/2009 with him; mammograms due  in 11/2009. Dr Nash Mantis 11/2008 OV reviewed ; RICA 60-79 % stenosis stable , recheck in 07/2009. Smoking intermittently; risk in context of RICA stenosis reviewed. She is on hydrocodone , Rxed by Dr Ranell Patrick post L foot fracture in 02/2009(note reviewed). She is averaging taking it 3-4X/ month for intermittent ankle pain.  Hypertension History:      She complains of peripheral edema, but denies headache, chest pain, palpitations, dyspnea with exertion, orthopnea, PND, visual symptoms, neurologic problems, syncope, and side effects from treatment.  She notes no problems with any antihypertensive medication side effects.  BP not checked @ home; occa swelling L ankle.        Positive major cardiovascular risk factors include female age 14 years old or older, hyperlipidemia, hypertension, and current tobacco user.  Negative major cardiovascular risk factors include no history of diabetes and negative family history for ischemic heart disease.        Positive history for target organ damage include peripheral vascular disease.  Further assessment for target organ damage reveals no history of ASHD or  stroke/TIA.     Preventive Screening-Counseling & Management  Caffeine-Diet-Exercise     Does Patient Exercise: yes  Allergies: 1)  ! Benazepril Hcl (Benazepril Hcl)  Past History:  Past Medical History: CAROTID ARTERY STENOSIS, BILATERAL (ICD-433.10) HYPERLIPIDEMIA (ICD-272.4) UNSPECIFIED ESSENTIAL HYPERTENSION (ICD-401.9) FASTING HYPERGLYCEMIA (ICD-790.29) ANGIONEUROTIC EDEMA (ICD-995.1) due to ACE-I BREAST CANCER, HX OF (ICD-V10.3) AMENORRHEA (ICD-626.0) RHEUMATIC FEVER, HX OF (ICD-V12.59)  Past Surgical History: Appendectomy Colonoscopy 2005 negative  Lumpectomy G0 P0, Dr Cherly Hensen  Family History:  Her mother is alive and well.  Her father died at age  21 of noncardiac causes, stabbed.  She has four sisters and two brothers, some of  whom have hypertension, but there is no history of myocardial infarction  or stroke in the family.   Social History: The patient is single.  She does not have children.  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  She has been a half-  pack per day smoker for 25 years, now occasionally.  She drinks alcohol on rare occasion.  Regular exercise-yes: walking 2 mpd weather permitting Does Patient Exercise:  yes  Review of Systems General:  Denies fatigue and sleep disorder. Eyes:  Denies blurring, double vision, and vision loss-both eyes. ENT:  Denies difficulty swallowing and hoarseness. CV:  Denies leg cramps with exertion, lightheadness, and near fainting. Resp:  Denies cough, coughing up blood, shortness of breath, sputum productive, and wheezing. GI:  Denies abdominal pain, bloody stools, dark tarry stools, and indigestion. Derm:  Denies changes in nail beds, dryness, hair loss, lesion(s), and rash. Neuro:  Denies brief paralysis, disturbances in coordination, falling down, numbness, poor balance, sensation of room spinning, tingling, tremors, and weakness. Psych:  Denies anxiety. Endo:  Complains of cold intolerance; denies  excessive hunger, excessive thirst, excessive urination, and heat intolerance. Heme:  Denies abnormal bruising and bleeding. Allergy:  Denies hives or rash, itching eyes, and sneezing.  Physical Exam  General:  well-nourished; alert,appropriate and cooperative throughout examination Eyes:  No corneal or conjunctival inflammation noted. Perrla. Funduscopic exam benign, without hemorrhages, exudates or papilledema. Arteriolar narrowing Neck:  No deformities, masses, or tenderness noted.Minimal asymmetry of thyroid Lungs:  Normal respiratory effort, chest expands symmetrically. Lungs are clear to auscultation, no crackles or wheezes. Heart:  normal rate, regular rhythm, no gallop, no rub, no JVD, no HJR, and grade 1 /6 systolic murmur.   Abdomen:  Bowel sounds positive,abdomen soft and non-tender without masses, organomegaly or hernias noted. Pulses:  R and L carotid,radial,dorsalis pedis and posterior tibial pulses are full and equal bilaterally. No carotid bruits  Extremities:  No clubbing, cyanosis, or deformity noted with normal full range of motion of all joints.   mild crepitus of knees.1/2+ left pedal edema.   Neurologic:  alert & oriented X3, strength normal in all extremities, gait normal, and DTRs symmetrical and normal.   Skin:  Intact without suspicious lesions or rashes Cervical Nodes:  No lymphadenopathy noted Axillary Nodes:  No palpable lymphadenopathy Psych:  memory intact for recent and remote, normally interactive, good eye contact, and not anxious appearing.     Impression & Recommendations:  Problem # 1:  UNSPECIFIED ESSENTIAL HYPERTENSION (ICD-401.9)  Her updated medication list for this problem includes:    Norvasc 10 Mg Tabs (Amlodipine besylate) .Marland Kitchen... 1 by mouth qd    Hydrochlorothiazide 25 Mg Tabs (Hydrochlorothiazide) .Marland Kitchen... Take one tablet by mouth daily.  Orders: Venipuncture (08657) TLB-BMP (Basic Metabolic Panel-BMET) (80048-METABOL)  Problem # 2:   HYPERLIPIDEMIA (ICD-272.4)  Orders: Venipuncture (84696) TLB-Lipid Panel (80061-LIPID) TLB-Hepatic/Liver Function Pnl (80076-HEPATIC) TLB-TSH (Thyroid Stimulating Hormone) (84443-TSH)  Problem # 3:  FASTING HYPERGLYCEMIA (ICD-790.29)  Orders: Venipuncture (29528) TLB-A1C / Hgb A1C (Glycohemoglobin) (83036-A1C)  Problem # 4:  CAROTID ARTERY STENOSIS, BILATERAL (ICD-433.10)  Problem # 5:  ANKLE PAIN, LEFT (ICD-719.47) post fracture  Problem # 6:  CIGARETTE SMOKER (ICD-305.1) risk discussed Orders: Tobacco use cessation intermediate 3-10 minutes (99406)  Complete Medication List: 1)  Norvasc 10 Mg Tabs (Amlodipine besylate) .Marland Kitchen.. 1 by mouth qd 2)  Prilosec 40 Mg Cpdr (Omeprazole) .Marland Kitchen.. 1 by mouth once daily as needed 3)  Tamoxifen Citrate 20 Mg Tabs (Tamoxifen citrate) .... Take 1 tablet by mouth two times a day 4)  Vitamin D 400 Unit Tabs (Cholecalciferol) .... Take 1 tab once daily 5)  Hydrocodone-acetaminophen 5-500 Mg Tabs (Hydrocodone-acetaminophen) .Marland Kitchen.. 1 q 6 hrs as needed 6)  Calcium Carbonate-vitamin D 600-400 Mg-unit Tabs (Calcium carbonate-vitamin d) .... Take 1 tablet by mouth once a day 7)  Hydrochlorothiazide 25 Mg Tabs (Hydrochlorothiazide) .... Take one tablet by mouth daily.  Hypertension Assessment/Plan:      The patient's hypertensive risk group is category C: Target organ damage and/or diabetes.  Her calculated 10 year risk of coronary heart disease is 17 %.  Today's blood pressure is 140/78.    Patient Instructions: 1)  Stop Smoking Tips: Choose a Quit date. Cut down before the Quit date. decide what you will do as a substitute when you feel the urge  to smoke(gum,toothpick,exercise). 2)  Check your Blood Pressure regularly. If it is above:135/85 ON AVERAGE you should make an appointment. Prescriptions: HYDROCODONE-ACETAMINOPHEN 5-500 MG TABS (HYDROCODONE-ACETAMINOPHEN) 1 q 6 hrs as needed  #30 x 0   Entered and Authorized by:   Marga Melnick MD   Signed by:    Marga Melnick MD on 06/14/2009   Method used:   Print then Give to Patient   RxID:   [id number]

## 2010-05-09 NOTE — Progress Notes (Signed)
Summary: Discuss Refill request  Phone Note Outgoing Call Call back at Home Phone [phone]   Call placed by: Shonna Chock,  May 31, 2009 3:13 PM Call placed to: Patient Summary of Call: Left message on machine for patient to return call when avaliable, Reason for call:   Per Dr.Hopper please contact patient and see why she is requesting a refill on Hydrocodone. If patient with ongoing pain she will need another therapy, Example: referral or re-evaluation.   Chrae Malloy  May 31, 2009 3:13 PM  Follow-up for Phone Call        Left message on machine for patient to return call when avaliable, Reason for call:   Discuss refill request Follow-up by: Shonna Chock,  June 01, 2009 9:05 AM  Additional Follow-up for Phone Call Additional follow up Details #1::        Left message on VM @ work phone.  Additional Follow-up by: Shonna Chock,  June 01, 2009 3:37 PM    Additional Follow-up for Phone Call Additional follow up Details #2::    Patient called back and left message on VM stating that she probably needs to see her other doctor or get them to refill her med. Patient would like a call back to discuss.   I called patient on the number that she left [phone], I informed patient that she will need to contact her other doctor if they are seeing her for the condition which the pain med was originally prescribed for (Leg Pain- in Aug/2010), otherwise patient will need to follow-up with Dr.Hopper to get futher follow-up./Chrae [facility]  June 02, 2009 8:44 AM     Follow-up by: Shonna Chock,  June 02, 2009 8:39 AM  Additional Follow-up for Phone Call Additional follow up Details #3:: Details for Additional Follow-up Action Taken: Patient called right back and said she slipped on Ice last Month and her left ankle swells and pains when she is on her feet for a long periods of time. Patient said she didnt need the Hydrocodone, she was requesting a med that was rx'ed  when she was seen at urgent care. Patient said she will hold off on getting anything right now and take OTC meds until seen by Dr.Hopper on the 8th of March for a CPX./Chrae [facility]  June 02, 2009 8:49 AM

## 2010-05-09 NOTE — Assessment & Plan Note (Signed)
Summary: F1Y   Visit Type:  Follow-up Primary Provider:  Marga Melnick MD  CC:  none.  History of Present Illness: 65 year-old woman with moderate, asymptomatic carotid stenosis presents for follow-up. She has moderate, 60-79% RICA stenosis, stable since 2006.  She has noted increased BP of late, otherwise has no complaints. She continues to smoke cigarettes but has cut back. She denies stroke or TIA symptoms. No chest pain or dyspnea. No complaints at this time.  Current Medications (verified): 1)  Norvasc 10 Mg Tabs (Amlodipine Besylate) .Marland Kitchen.. 1 By Mouth Qd 2)  Prilosec 40 Mg Cpdr (Omeprazole) .Marland Kitchen.. 1 By Mouth Once Daily As Needed 3)  Tamoxifen Citrate 20 Mg Tabs (Tamoxifen Citrate) .... Take 1 Tablet By Mouth Once Daily 4)  Gabapentin 300 Mg Caps (Gabapentin) .... Take One Capsule At Bedtime As Needed  Allergies: 1)  ! Benazepril Hcl (Benazepril Hcl)  Past History:  Past medical history reviewed for relevance to current acute and chronic problems.  Past Medical History: Reviewed history from 06/14/2009 and no changes required. CAROTID ARTERY STENOSIS, BILATERAL (ICD-433.10) HYPERLIPIDEMIA (ICD-272.4) UNSPECIFIED ESSENTIAL HYPERTENSION (ICD-401.9) FASTING HYPERGLYCEMIA (ICD-790.29) ANGIONEUROTIC EDEMA (ICD-995.1) due to ACE-I BREAST CANCER, HX OF (ICD-V10.3) AMENORRHEA (ICD-626.0) RHEUMATIC FEVER, HX OF (ICD-V12.59)  Review of Systems       Positive for GERD symptoms, otherwise negative except as per HPI   Vital Signs:  Patient profile:   65 year old female Weight:      208.50 pounds Pulse rate:   68 / minute Resp:     14 per minute BP sitting:   180 / 84  (right arm) Cuff size:   large  Vitals Entered By: Julieta Gutting, RN, BSN (January 05, 2010 9:41 AM)  Physical Exam  General:  Pt is alert and oriented, obese African-American woman in no acute distress. HEENT: normal Neck: normal carotid upstrokes with bilateral bruits, JVP normal Lungs: CTA CV: RRR  without murmur or gallop Abd: soft, NT, positive BS, no bruit, no organomegaly Ext: no clubbing, cyanosis, or edema. peripheral pulses 2+ and equal Skin: warm and dry without rash    EKG  Procedure date:  01/05/2010  Findings:      NSR 68 bpm within normal limits  Impression & Recommendations:  Problem # 1:  CAROTID ARTERY STENOSIS, BILATERAL (ICD-433.10) She has stable, moderate ICA stenosis, and she remains asymptomatic. We again discussed the importance of tobacco cessation as well as adherence to medical therapy and exercise/dietary modification. She is overdue for carotid duplex followup and this will be scheduled.  Orders: EKG w/ Interpretation (93000) Carotid Duplex (Carotid Duplex)  Problem # 2:  UNSPECIFIED ESSENTIAL HYPERTENSION (ICD-401.9) BP is uncontrolled. Dr Alwyn Ren wrote an Rx for spironolactone when he saw her last but she has not filled it. We prescribed this again today and set her up for a followup metabolic panel in a few wks. She had hypokalemia with HCTZ in the past.  The following medications were removed from the medication list:    Spironolactone 25 Mg Tabs (Spironolactone) .Marland Kitchen... 1 once daily Her updated medication list for this problem includes:    Norvasc 10 Mg Tabs (Amlodipine besylate) .Marland Kitchen... 1 by mouth qd    Spironolactone 25 Mg Tabs (Spironolactone) .Marland Kitchen... Take one tablet by mouth daily  Orders: EKG w/ Interpretation (93000) Carotid Duplex (Carotid Duplex)  Problem # 3:  HYPERLIPIDEMIA (ICD-272.4) LDL in March was about 120 mg/dL. In setting of carotid stenosis, I advised pravastatin 40 mg daily as this is  a CAD risk equivalent. Followup lipids and lft's in 3 months.  Her updated medication list for this problem includes:    Pravastatin Sodium 40 Mg Tabs (Pravastatin sodium) .Marland Kitchen... Take one tablet by mouth daily at bedtime  Orders: EKG w/ Interpretation (93000) Carotid Duplex (Carotid Duplex)  CHOL: 210 (06/14/2009)   LDL: 100 (11/10/2008)    HDL: 74.20 (06/14/2009)   TG: 158.0 (06/14/2009) CHOL (goal): 200 (02/25/2007)   LDL (goal): 100 (11/10/2008)   HDL (goal): 50 (11/10/2008)   TG (goal): 150 (02/25/2007)  Patient Instructions: 1)  Your physician recommends that you return for a FASTING Lipid and Liver Profile in 12 WEEKS (433.10, 272.0)--Nothing to eat or drink after midnight  2)  Your physician recommends that you return for lab work in: 2 WEEKS (BMP 433.10, 272.0) 3)  Your physician has recommended you make the following change in your medication: START Spironolactone 25mg  one by mouth daily, START Pravastatin 40mg  one by mouth at bedtime  4)  Your physician wants you to follow-up in:  1 YEAR.  You will receive a reminder letter in the mail two months in advance. If you don't receive a letter, please call our office to schedule the follow-up appointment. 5)  Your physician has requested that you have a carotid duplex. This test is an ultrasound of the carotid arteries in your neck. It looks at blood flow through these arteries that supply the brain with blood. Allow one hour for this exam. There are no restrictions or special instructions. Prescriptions: SPIRONOLACTONE 25 MG TABS (SPIRONOLACTONE) Take one tablet by mouth daily  #90 x 3   Entered by:   Julieta Gutting, RN, BSN   Authorized by:   Norva Karvonen, MD   Signed by:   Julieta Gutting, RN, BSN on 01/05/2010   Method used:   Electronically to        [facility] [phone]* (retail)       [address]       [place], [place]  [address]       Ph: [phone]       Fax: [phone]   RxID:   [id number] PRAVASTATIN SODIUM 40 MG TABS (PRAVASTATIN SODIUM) Take one tablet by mouth daily at bedtime  #90 x 3   Entered by:   Julieta Gutting, RN, BSN   Authorized by:   Norva Karvonen, MD   Signed by:   Julieta Gutting, RN, BSN on 01/05/2010   Method used:   Electronically to        [facility] [phone]* (retail)       [address]       [place],  [place]  [address]       Ph: [phone]       Fax: [phone]   RxID:   [id number]

## 2010-05-09 NOTE — Assessment & Plan Note (Signed)
Summary: bp check--off aldactone x1 week  Nurse Visit   Vital Signs:  Patient profile:   65 year old female Weight:      206.25 pounds BP sitting:   138 / 82  (right arm)  Problems Prior to Update: 1)  Cigarette Smoker  (ICD-305.1) 2)  Ankle Pain, Left  (ICD-719.47) 3)  Carotid Artery Stenosis, Bilateral  (ICD-433.10) 4)  Hyperlipidemia  (ICD-272.4) 5)  Unspecified Essential Hypertension  (ICD-401.9) 6)  Fasting Hyperglycemia  (ICD-790.29) 7)  Angioneurotic Edema  (ICD-995.1) 8)  Neoplasm, Malignant, Breast, Hx of  (ICD-V10.3) 9)  Breast Cancer, Hx of  (ICD-V10.3) 10)  Amenorrhea  (ICD-626.0) 11)  Rheumatic Fever, Hx of  (ICD-V12.59)  Current Problems (verified): 1)  Cigarette Smoker  (ICD-305.1) 2)  Ankle Pain, Left  (ICD-719.47) 3)  Carotid Artery Stenosis, Bilateral  (ICD-433.10) 4)  Hyperlipidemia  (ICD-272.4) 5)  Unspecified Essential Hypertension  (ICD-401.9) 6)  Fasting Hyperglycemia  (ICD-790.29) 7)  Angioneurotic Edema  (ICD-995.1) 8)  Neoplasm, Malignant, Breast, Hx of  (ICD-V10.3) 9)  Breast Cancer, Hx of  (ICD-V10.3) 10)  Amenorrhea  (ICD-626.0) 11)  Rheumatic Fever, Hx of  (ICD-V12.59)  Medications Prior to Update: 1)  Norvasc 10 Mg Tabs (Amlodipine Besylate) .Marland Kitchen.. 1 By Mouth Qd 2)  Prilosec 40 Mg Cpdr (Omeprazole) .Marland Kitchen.. 1 By Mouth Once Daily As Needed 3)  Tamoxifen Citrate 20 Mg Tabs (Tamoxifen Citrate) .... Take 1 Tablet By Mouth Once Daily 4)  Gabapentin 300 Mg Caps (Gabapentin) .... Take One Capsule At Bedtime As Needed 5)  Pravastatin Sodium 40 Mg Tabs (Pravastatin Sodium) .... Take One Tablet By Mouth Daily At Bedtime 6)  Spironolactone 25 Mg Tabs (Spironolactone) .... Take One Tablet By Mouth Daily  Allergies: 1)  ! Benazepril Hcl (Benazepril Hcl)  Impression & Recommendations:  Problem # 1:  UNSPECIFIED ESSENTIAL HYPERTENSION (ICD-401.9)  Pt comes in today to have BP checked. Pt stated that on the 9/29 visit she was started on two new medicines  which the records show as Sprionolactone and Pravastatin. Pt stopped the two meds because of itching on hands and feet but was able to get relief with Benadryl.  She initially first stopped taking the Pravastatin  and took Sprionolactone in the am and became a little itchy. Then she stopped the Pravastatin.  Last night she took the Pravastatin and this morning has taken the Sprironolactone. She denies itching today.  Pt states that she will try meds again this week. Pt will advise if she has further problems.   Her updated medication list for this problem includes:    Norvasc 10 Mg Tabs (Amlodipine besylate) .Marland Kitchen... 1 by mouth qd    Spironolactone 25 Mg Tabs (Spironolactone) .Marland Kitchen... Take one tablet by mouth daily

## 2010-05-11 NOTE — Assessment & Plan Note (Signed)
Summary: CHEST CONGESTION FOR A WEEK--DOESNT WANT TO GET PNEUMONIA AGA...   Vital Signs:  Patient profile:   65 year old female Weight:      205.2 pounds BMI:     35.35 Temp:     98.9 degrees F oral Pulse rate:   72 / minute Resp:     15 per minute BP sitting:   112 / 78  (right arm) Cuff size:   large  Vitals Entered By: Shonna Chock CMA (April 21, 2010 10:53 AM) CC: Cough x 1 week, productive at times and discolored (brown) , URI symptoms   Primary Care Provider:  Marga Melnick MD  CC:  Cough x 1 week, productive at times and discolored (brown) , and URI symptoms.  History of Present Illness:      This is a 64 year old woman who presents with  RTI symptoms; onset 1 week ago as ST.  The patient  now reports purulent nasal discharge, sore throat, and productive cough, but denies earache.  Associated symptoms include wheezing slightly @ night.  The patient denies fever and dyspnea.  The patient also reports frontal  headache.  The patient denies the following risk factors for Strep sinusitis: unilateral facial pain, tooth pain, and tender adenopathy.  Rx: cough med Rxed previously  Current Medications (verified): 1)  Norvasc 10 Mg Tabs (Amlodipine Besylate) .Marland Kitchen.. 1 By Mouth Qd 2)  Tamoxifen Citrate 20 Mg Tabs (Tamoxifen Citrate) .... Take 1 Tablet By Mouth Once Daily 3)  Gabapentin 300 Mg Caps (Gabapentin) .... Take One Capsule At Bedtime As Needed 4)  Pravastatin Sodium 40 Mg Tabs (Pravastatin Sodium) .... Take One Tablet By Mouth Daily At Bedtime 5)  Spironolactone 25 Mg Tabs (Spironolactone) .... Take One Tablet By Mouth Daily  Allergies: 1)  ! Benazepril Hcl (Benazepril Hcl)  Physical Exam  General:  well-nourished,in no acute distress; alert,appropriate and cooperative throughout examination Ears:  External ear exam shows no significant lesions or deformities.  Otoscopic examination reveals clear canals, tympanic membranes are intact bilaterally without bulging,  retraction, inflammation or discharge. Hearing is grossly normal bilaterally. Nose:  External nasal examination shows no deformity or inflammation. Nasal mucosa are pink and moist without lesions or exudates. Mouth:  Oral mucosa and oropharynx without lesions or exudates.  Teeth in good repair. Slightly hoarse Lungs:  Normal respiratory effort, chest expands symmetrically. Lungs are clear to auscultation, no crackles or wheezes. Cervical Nodes:  No lymphadenopathy noted Axillary Nodes:  No palpable lymphadenopathy   Impression & Recommendations:  Problem # 1:  SINUSITIS- ACUTE-NOS (ICD-461.9)  Her updated medication list for this problem includes:    Amoxicillin 500 Mg Caps (Amoxicillin) .Marland Kitchen... 1 three times a day    Hydromet 5-1.5 Mg/12ml Syrp (Hydrocodone-homatropine) .Marland Kitchen... 1 tsp every 6 hrs as needed for cough  Problem # 2:  BRONCHITIS-ACUTE (ICD-466.0)  Her updated medication list for this problem includes:    Amoxicillin 500 Mg Caps (Amoxicillin) .Marland Kitchen... 1 three times a day    Hydromet 5-1.5 Mg/7ml Syrp (Hydrocodone-homatropine) .Marland Kitchen... 1 tsp every 6 hrs as needed for cough  Complete Medication List: 1)  Norvasc 10 Mg Tabs (Amlodipine besylate) .Marland Kitchen.. 1 by mouth qd 2)  Tamoxifen Citrate 20 Mg Tabs (Tamoxifen citrate) .... Take 1 tablet by mouth once daily 3)  Gabapentin 300 Mg Caps (Gabapentin) .... Take one capsule at bedtime as needed 4)  Pravastatin Sodium 40 Mg Tabs (Pravastatin sodium) .... Take one tablet by mouth daily at bedtime 5)  Spironolactone  25 Mg Tabs (Spironolactone) .... Take one tablet by mouth daily 6)  Amoxicillin 500 Mg Caps (Amoxicillin) .Marland Kitchen.. 1 three times a day 7)  Hydromet 5-1.5 Mg/57ml Syrp (Hydrocodone-homatropine) .Marland Kitchen.. 1 tsp every 6 hrs as needed for cough  Patient Instructions: 1)  Neti pot once daily - two times a day as needed for  head congestion has been Rxed by me. 2)  Drink as much  NON dairy fluid as you can tolerate for the next few  days. Prescriptions: HYDROMET 5-1.5 MG/5ML SYRP (HYDROCODONE-HOMATROPINE) 1 tsp every 6 hrs as needed for cough  #120 cc x 0   Entered and Authorized by:   Marga Melnick MD   Signed by:   Marga Melnick MD on 04/21/2010   Method used:   Print then Give to Patient   RxID:   [id number] AMOXICILLIN 500 MG CAPS (AMOXICILLIN) 1 three times a day  #30 x 0   Entered and Authorized by:   Marga Melnick MD   Signed by:   Marga Melnick MD on 04/21/2010   Method used:   Print then Give to Patient   RxID:   [id number]    Orders Added: 1)  Est. Patient Level III [84696]

## 2010-05-11 NOTE — Letter (Signed)
Summary: [facility]  [facility]   Imported By: Lanelle Bal 03/22/2010 12:16:09  _____________________________________________________________________  External Attachment:    Type:   Image     Comment:   External Document

## 2010-05-11 NOTE — Assessment & Plan Note (Signed)
Summary: rt foot pain around big toe///sph   Vital Signs:  Patient profile:   65 year old female Temp:     98.8 degrees F Pulse rate:   84 / minute Resp:     16 per minute BP sitting:   138 / 84  Primary Care Provider:  Marga Melnick MD  CC:  Lower Extremity Joint pain.  History of Present Illness:      This is a 65 year old woman who presents with R foot  pain upon awakening 05/04/2010.  The patient reports swelling, redness, and decreased ROM, but denies giving away, locking, popping, and stiffness for >1 hr.   The pain began suddenly and with no injury.  The pain is described as sharp and constant.  The patient denies the following symptoms: fever, rash, photosensitivity, eye symptoms, diarrhea, and dysuria.  Rx: none. Her mother has Gout.  Allergies: 1)  ! Benazepril Hcl (Benazepril Hcl)   Foot/Ankle Exam  General:    well-nourished ,normal body habitus; no deformities, normal grooming.    Skin:    Intact with no scars, lesions, rashes; faint erythema over R 1st MTP joint  Inspection:    Inspection reveals  prominence of R 1st MTP  Palpation:    tenderness R  1st MTP jointtenderness R-forefoot:   Vascular:    pedal pulses intact   Motor:    Motor strength RLE   Reflexes:    Normal and symmetric  knee  reflexes bilaterally.     Impression & Recommendations:  Problem # 1:  TOE PAIN (ICD-729.5)  classic Podagra suggested  Orders: Venipuncture (84132) TLB-Uric Acid, Blood (84550-URIC)  Complete Medication List: 1)  Norvasc 10 Mg Tabs (Amlodipine besylate) .Marland Kitchen.. 1 by mouth qd 2)  Tamoxifen Citrate 20 Mg Tabs (Tamoxifen citrate) .... Take 1 tablet by mouth once daily 3)  Gabapentin 300 Mg Caps (Gabapentin) .... Take one capsule at bedtime as needed 4)  Pravastatin Sodium 40 Mg Tabs (Pravastatin sodium) .... Take one tablet by mouth daily at bedtime 5)  Spironolactone 25 Mg Tabs (Spironolactone) .... Take one tablet by mouth daily 6)  Amoxicillin 500 Mg Caps  (Amoxicillin) .Marland Kitchen.. 1 three times a day 7)  Hydromet 5-1.5 Mg/22ml Syrp (Hydrocodone-homatropine) .Marland Kitchen.. 1 tsp every 6 hrs as needed for cough 8)  Indomethacin 50 Mg  .Marland Kitchen.. 1 three times a day as needed with food  Patient Instructions: 1)  Go to Web MD for information on Gout( Podagra) & purine restriction. High Fructose Corn Syrup raises Uric Acid also. Prescriptions: INDOMETHACIN  50 MG 1 three times a day as needed with food  #30 x 0   Entered and Authorized by:   Marga Melnick MD   Signed by:   Marga Melnick MD on 05/05/2010   Method used:   Print then Give to Patient   RxID:   [id number]    Orders Added: 1)  Est. Patient Level III [47425] 2)  Venipuncture [95638] 3)  TLB-Uric Acid, Blood [84550-URIC]  Appended Document: rt foot pain around big toe///sph

## 2010-06-16 ENCOUNTER — Encounter: Payer: Self-pay | Admitting: Family Medicine

## 2010-06-16 ENCOUNTER — Ambulatory Visit (INDEPENDENT_AMBULATORY_CARE_PROVIDER_SITE_OTHER): Payer: BC Managed Care – PPO | Admitting: Family Medicine

## 2010-06-16 ENCOUNTER — Ambulatory Visit: Payer: Self-pay | Admitting: Internal Medicine

## 2010-06-16 DIAGNOSIS — M722 Plantar fascial fibromatosis: Secondary | ICD-10-CM

## 2010-06-16 DIAGNOSIS — IMO0002 Reserved for concepts with insufficient information to code with codable children: Secondary | ICD-10-CM | POA: Insufficient documentation

## 2010-06-16 DIAGNOSIS — M7150 Other bursitis, not elsewhere classified, unspecified site: Secondary | ICD-10-CM

## 2010-06-20 NOTE — Assessment & Plan Note (Signed)
Summary: bottom of foot discolored/cbs   Vital Signs:  Patient profile:   65 year old female Height:      64 inches (162.56 cm) Weight:      213.13 pounds (96.88 kg) BMI:     36.72 Temp:     98.3 degrees F (36.83 degrees C) oral BP sitting:   140 / 90  (left arm) Cuff size:   large  Vitals Entered By: Lucious Groves CMA (June 16, 2010 9:53 AM) CC: C/O fott discoloration./kb Is Patient Diabetic? No Pain Assessment Patient in pain? yes     Location: foot Intensity: 5 Type: sharp Onset of pain  continued--1 month ago Comments Patient notes that she has a swollen area. Patient denies leg pain.   History of Present Illness: 65 yo woman here today for R foot pain/discoloration.  has some swelling anterior to lateral malleous.  notes 2 blisters appeared last night- 1 between 1st and 2nd toe and the other inferior to medial malleous.  no pain, no redness, no pus.  also having pain along arch on bottom of foot.  very painful to stand on after periods of inactivity.  no hx of similar.  had been taking indomethacin as needed for gout- has run out of this medicine.  Current Medications (verified): 1)  Norvasc 10 Mg Tabs (Amlodipine Besylate) .Marland Kitchen.. 1 By Mouth Qd 2)  Tamoxifen Citrate 20 Mg Tabs (Tamoxifen Citrate) .... Take 1 Tablet By Mouth Once Daily 3)  Gabapentin 300 Mg Caps (Gabapentin) .... Take One Capsule At Bedtime As Needed 4)  Pravastatin Sodium 40 Mg Tabs (Pravastatin Sodium) .... Take One Tablet By Mouth Daily At Bedtime 5)  Spironolactone 25 Mg Tabs (Spironolactone) .... Take One Tablet By Mouth Daily 6)  Amoxicillin 500 Mg Caps (Amoxicillin) .Marland Kitchen.. 1 Three Times A Day 7)  Hydromet 5-1.5 Mg/62ml Syrp (Hydrocodone-Homatropine) .Marland Kitchen.. 1 Tsp Every 6 Hrs As Needed For Cough 8)  Indomethacin 50 Mg Caps (Indomethacin) .Marland Kitchen.. 1 By Mouth Three Times A Day W/ Food As Needed For Pain. 9)  1 Pair of Cushioned Insoles .... Use As Directed For Plantar Fasciitis  Allergies (verified): 1)  !  Benazepril Hcl (Benazepril Hcl)  Past History:  Past medical, surgical, family and social histories (including risk factors) reviewed, and no changes noted (except as noted below).  Past Medical History: Reviewed history from 06/14/2009 and no changes required. CAROTID ARTERY STENOSIS, BILATERAL (ICD-433.10) HYPERLIPIDEMIA (ICD-272.4) UNSPECIFIED ESSENTIAL HYPERTENSION (ICD-401.9) FASTING HYPERGLYCEMIA (ICD-790.29) ANGIONEUROTIC EDEMA (ICD-995.1) due to ACE-I BREAST CANCER, HX OF (ICD-V10.3) AMENORRHEA (ICD-626.0) RHEUMATIC FEVER, HX OF (ICD-V12.59)  Past Surgical History: Reviewed history from 06/14/2009 and no changes required. Appendectomy Colonoscopy 2005 negative  Lumpectomy G0 P0, Dr Cherly Hensen  Family History: Reviewed history from 06/14/2009 and no changes required.  Her mother is alive and well.  Her father died at age  33 of noncardiac causes, stabbed.  She has four sisters and two brothers, some of  whom have hypertension, but there is no history of myocardial infarction  or stroke in the family.   Social History: Reviewed history from 06/14/2009 and no changes required. The patient is single.  She does not have children.  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  She has been a half-  pack per day smoker for 25 years, now occasionally.  She drinks alcohol on rare occasion.  Regular exercise-yes: walking 2 mpd weather permitting  Review of Systems      See HPI  Physical Exam  General:  well-nourished ,normal body habitus; no deformities, normal grooming.   Pulses:  +2 DP/PT Extremities:  swelling of the R lateral malleolar bursa 1 cm size thick walled, fluid filled blister between 1st and 2nd toe and inferior to medial malleolus.  no indication of infxn. + TTP over insertion of plantar fascia   Impression & Recommendations:  Problem # 1:  BURSITIS (ICD-727.3) Assessment New pt w/ apparent bursa swelling on R ankle.  refill indomethacin as this has  worked well for pt in past.  reviewed supportive care and red flags that should prompt return.  Pt expresses understanding and is in agreement w/ this plan.  Problem # 2:  FOOT&TOE BLISTER WITHOUT MENTION OF INFECTION (ICD-917.2) Assessment: New encouraged pt to switch her foot wear.  as something appears to be causing excessive friction in the 2 areas.  Problem # 3:  PLANTAR FASCIITIS (ICD-728.71) Assessment: New continue NSAIDs as needed.  reviewed ice and stretching techniques.  reviewed supportive care and red flags that should prompt return.  Pt expresses understanding and is in agreement w/ this plan. Her updated medication list for this problem includes:    Indomethacin 50 Mg Caps (Indomethacin) .Marland Kitchen... 1 by mouth three times a day w/ food as needed for pain.  Complete Medication List: 1)  Norvasc 10 Mg Tabs (Amlodipine besylate) .Marland Kitchen.. 1 by mouth qd 2)  Tamoxifen Citrate 20 Mg Tabs (Tamoxifen citrate) .... Take 1 tablet by mouth once daily 3)  Gabapentin 300 Mg Caps (Gabapentin) .... Take one capsule at bedtime as needed 4)  Pravastatin Sodium 40 Mg Tabs (Pravastatin sodium) .... Take one tablet by mouth daily at bedtime 5)  Spironolactone 25 Mg Tabs (Spironolactone) .... Take one tablet by mouth daily 6)  Amoxicillin 500 Mg Caps (Amoxicillin) .Marland Kitchen.. 1 three times a day 7)  Hydromet 5-1.5 Mg/53ml Syrp (Hydrocodone-homatropine) .Marland Kitchen.. 1 tsp every 6 hrs as needed for cough 8)  Indomethacin 50 Mg Caps (Indomethacin) .Marland Kitchen.. 1 by mouth three times a day w/ food as needed for pain. 9)  1 Pair of Cushioned Insoles  .... Use as directed for plantar fasciitis  Patient Instructions: 1)  If no better or at any time worsening- please call 2)  Elevate your feet today 3)  Get some cushioned insoles to better support and cushion your feet 4)  Take the Indomethacin as needed for pain 5)  Ice the foot over the water bottle 2-3x/day for the plantar fasciitis 6)  Do the gentle stretches I showed you at least  1-2x/day 7)  Try and avoid popping the blisters to prevent infection 8)  Call with any questions or concerns 9)  Hang in there!!! Prescriptions: 1 PAIR OF CUSHIONED INSOLES use as directed for plantar fasciitis  #1 x 0   Entered and Authorized by:   Neena Rhymes MD   Signed by:   Neena Rhymes MD on 06/16/2010   Method used:   Print then Give to Patient   RxID:   [id number] INDOMETHACIN 50 MG CAPS (INDOMETHACIN) 1 by mouth three times a day w/ food as needed for pain.  #45 x 0   Entered and Authorized by:   Neena Rhymes MD   Signed by:   Neena Rhymes MD on 06/16/2010   Method used:   Electronically to        [facility] [phone] (retail)       [address]       [place], [place]  [address]  Ph: [phone]       Fax: [phone]   RxID:   [id number]    Orders Added: 1)  Est. Patient Level IV [54270]

## 2010-07-12 ENCOUNTER — Other Ambulatory Visit: Payer: Self-pay | Admitting: Family Medicine

## 2010-07-17 NOTE — Telephone Encounter (Signed)
Last OV and filled 06/16/10 #45, no refills.

## 2010-08-22 NOTE — Letter (Signed)
July 01, 2007    Titus Dubin. Alwyn Ren, MD, FACP, FCCP  [phone] [place], [place]  [address]   RE:  [name], [name]  MRN:  [id number]  /  DOB:  [birth year]   Dear Jearld Adjutant,   It was my pleasure to see [name] for evaluation of her carotid  arterial disease on June 27, 2007.   As you know, [name] is a delightful 65 year old woman who had a  carotid ultrasound performed in May 2008 demonstrating moderate carotid  stenosis on the right and mild stenosis on the left.  By velocity  measurements, her right internal carotid artery has 60-79% stenosis in  the left, has less than 40% stenosis.  [name] has no history of  stroke or TIA.  She has a risk profile that includes hypertension and  tobacco use.   From a symptomatic standpoint, [name] feels well.  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  She is active at work  and does a good bit of walking.  She has no chest pain, dyspnea,  lightheadedness, syncope, palpitations, edema or other or other  cardiovascular complaints.   MEDICATIONS:  1. Norvasc 10 mg daily.  2. Benazepril 40 mg twice daily.  3. Arimidex 1 mg daily.  4. Pravastatin 20 mg at bedtime.  5. Hydrochlorothiazide 12.5 mg daily.  6. Calcium 600 mg one to two daily.   ALLERGIES:  NKDA.   PAST MEDICAL HISTORY:  1. Pertinent for history of breast cancer status post left breast      lumpectomy and radiation.  2. Essential hypertension.  3. Carotid stenosis as outlined above.  4. Remote appendicitis with appendectomy in 1973.  5. Dyslipidemia.  6. Rheumatic fever at age 10 with no cardiac sequelae.   SOCIAL HISTORY:  The patient is single.  She does not have children.  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  She has been a half-  pack per day smoker for 25 years.  She drinks alcohol on rare occasion.  She has not engaged in any formal exercise program.   FAMILY HISTORY:  Her mother is alive and well.  Her father died at  age  84 of noncardiac causes.  She has four sisters and two brothers, some of  whom have hypertension, but there is no history of myocardial infarction  or stroke in the family.   REVIEW OF SYSTEMS:  A Complete 12-point review of systems was performed  with no pertinent positives to report.   PHYSICAL EXAMINATION:  GENERAL:  The patient is alert and oriented.  She  is a very pleasant woman in no acute distress.  VITAL SIGNS:  Weight 193, blood pressure is 150/86, heart rate 72,  respiratory rate 16.  HEENT:  Normal.  NECK:  Normal carotid upstrokes with bilateral carotid bruits, right  greater than left.  There is no thyromegaly or thyroid nodules.  Jugular  venous pressure is normal.  LUNGS:  Clear bilaterally.  HEART:  The apex is discreet, nondisplaced.  Regular rate and rhythm  without murmurs or gallops.  ABDOMEN:  Is soft, obese, nontender, no organomegaly.  No abdominal  bruits.  BACK:  No CVA tenderness.  EXTREMITIES:  No clubbing, cyanosis or edema.  Peripheral pulses are 2+  and equal throughout.  SKIN:  Warm and dry without rash.  NEUROLOGIC:  Cranial nerves II-XII are intact.  Strength 5/5 and equal.   STUDIES REVIEWED:  A carotid  ultrasound from Aug 08, 2006, shows 60-79%  stenosis of the right internal carotid and 0-39% stenosis of the left  internal carotid.  There was moderate heterogeneous plaque on the right  and mild plaque on the left.  Vertebral arteries were patent with  antegrade flow bilaterally.  In reviewing her past ultrasounds dating  back to 2006, her carotid disease appears stable.   ASSESSMENT:  [name] has moderate right internal carotid artery  stenosis.  This is an absence of cerebrovascular symptoms.  Fortunately,  her findings appear stable over at least 3 years.  Her main issue  relates to risk factor modification and ongoing surveillance of her  carotid stenosis.  We had a long discussion today regarding smoking  cessation and some  strategies to discontinue tobacco.  She is under  treatment for her hypertension and dyslipidemia.  She is due for a  carotid ultrasound and should continue with at least yearly follow up.  In an asymptomatic patient with stable disease such as [name], I  would recommend antiplatelet therapy with aspirin which I have asked her  to begin at a dose of 81 mg.  If her carotid stenosis progresses to  greater than 80% by ultrasound, then I would recommend revascularization  at that point with carotid endarterectomy.  We will continue to follow  her on a yearly basis to follow her carotid stenosis closely.   Thanks again for asking me to see [name].  Please contact me at any  time with questions.    Sincerely,      Kimberly Fells. Excell Seltzer, MD  Electronically Signed    MDC/MedQ  DD: 07/01/2007  DT: 07/01/2007  Job #: [id number]

## 2010-08-24 ENCOUNTER — Encounter (HOSPITAL_BASED_OUTPATIENT_CLINIC_OR_DEPARTMENT_OTHER): Payer: BC Managed Care – PPO | Admitting: Oncology

## 2010-08-24 ENCOUNTER — Other Ambulatory Visit: Payer: Self-pay | Admitting: Oncology

## 2010-08-24 DIAGNOSIS — Z170 Estrogen receptor positive status [ER+]: Secondary | ICD-10-CM

## 2010-08-24 DIAGNOSIS — C50119 Malignant neoplasm of central portion of unspecified female breast: Secondary | ICD-10-CM

## 2010-08-24 DIAGNOSIS — M109 Gout, unspecified: Secondary | ICD-10-CM

## 2010-08-24 DIAGNOSIS — C50919 Malignant neoplasm of unspecified site of unspecified female breast: Secondary | ICD-10-CM

## 2010-08-24 LAB — CBC WITH DIFFERENTIAL/PLATELET
BASO%: 0.3 % (ref 0.0–2.0)
EOS%: 1.9 % (ref 0.0–7.0)
Eosinophils Absolute: 0.2 10*3/uL (ref 0.0–0.5)
LYMPH%: 36.3 % (ref 14.0–49.7)
MCH: 28.3 pg (ref 25.1–34.0)
MCHC: 33.2 g/dL (ref 31.5–36.0)
MCV: 85.5 fL (ref 79.5–101.0)
MONO%: 7.3 % (ref 0.0–14.0)
NEUT#: 4.9 10*3/uL (ref 1.5–6.5)
Platelets: 240 10*3/uL (ref 145–400)
RBC: 3.87 10*6/uL (ref 3.70–5.45)
RDW: 12.8 % (ref 11.2–14.5)

## 2010-08-24 LAB — COMPREHENSIVE METABOLIC PANEL
AST: 13 U/L (ref 0–37)
Albumin: 3.7 g/dL (ref 3.5–5.2)
Alkaline Phosphatase: 56 U/L (ref 39–117)
Glucose, Bld: 88 mg/dL (ref 70–99)
Potassium: 3.9 mEq/L (ref 3.5–5.3)
Sodium: 135 mEq/L (ref 135–145)
Total Bilirubin: 0.3 mg/dL (ref 0.3–1.2)
Total Protein: 7.4 g/dL (ref 6.0–8.3)

## 2010-08-25 NOTE — Op Note (Signed)
   [name], [name]                            ACCOUNT NO.:  [id number]   MEDICAL RECORD NO.:  [id number]                   PATIENT TYPE:  AMB   LOCATION:  ENDO                                 FACILITY:  [facility]   PHYSICIAN:  James L. Malon Kindle., M.D.          DATE OF BIRTH:  1946/03/17   DATE OF PROCEDURE:  02/15/2003  DATE OF DISCHARGE:                                 OPERATIVE REPORT   PROCEDURE:  Colonoscopy.   MEDICATIONS:  Fentanyl 25 micrograms, Versed 8.5 mg IV.   INSTRUMENT USED:  Olympus pediatric colonoscope.   INDICATIONS FOR PROCEDURE:  Heme positive stools.   DESCRIPTION OF PROCEDURE:  The procedure had been explained to the patient  and consent was obtained. The patient was placed in the left lateral  decubitus position and the Olympus scope was inserted and advanced.   The prep was excellent. We were able to reach the cecum using abdominal  pressure and position  changes.  The ileocecal valve and appendiceal orifice  were seen. The scope was withdrawn to the cecum. The ascending colon,  hepatic flexure, transverse colon, descending and sigmoid colon were seen  well. No polyps were seen. There was no significant diverticular disease.  There were internal hemorrhoids seen in the rectum. The rectum was free of  polyps.   The scope was withdrawn. The patient tolerated the procedure well. She was  hypertensive throughout with systolic pressures 200 to 110. She has Norvasc  at home but did not take it.   ASSESSMENT:  Heme positive stools, probably due to internal hemorrhoids.   PLAN:  Will go ahead and give a hemorrhoid instruction sheet and have her  take her blood pressure medications  when she returns home. Would recommend  yearly Hemoccults. Possibly repeat the procedure again in 10 years.                                               James L. Malon Kindle., M.D.    Waldron Session  D:  02/15/2003  T:  02/15/2003  Job:  [id number]   cc:   Gerrit Friends. Aldona Bar, M.D.  [address]  [place]  [place] [address]  Fax: [phone]

## 2010-08-25 NOTE — Op Note (Signed)
NAMEISLAND, DOHMEN                ACCOUNT NO.:  [id number]   MEDICAL RECORD NO.:  [id number]          PATIENT TYPE:  AMB   LOCATION:  DSC                          FACILITY:  [facility]   PHYSICIAN:  Rose Phi. Maple Hudson, M.D.   DATE OF BIRTH:  [birth year]   DATE OF PROCEDURE:  DATE OF DISCHARGE:                                 OPERATIVE REPORT   PREOPERATIVE DIAGNOSIS:  Multifocal carcinoma of the left breast.   POSTOPERATIVE DIAGNOSIS:  Multifocal carcinoma of the left breast.   OPERATION:  1. Blue dye injection.  2. Left partial mastectomy with delocalization and specimen mammogram and      additional needle localized left breast biopsy and left axillary      sentinel lymph node biopsy.   SURGEON:  Dr. Maple Hudson.   ANESTHESIA:  General.   OPERATIVE PROCEDURE:  Prior to coming to the operating room, localizing  wires had been placed.  There were 2 lesions in the upper portion of the  breast for which 2 wires were inserted, and then a third lesion that was  thought to be benign at the 3 o'clock position.  One mCi of technetium  sulfur colloid was injected in the dermal layer.   After suitable general anesthesia was induced, the patient was placed in a  supine position with the arms extended on the arm board.  Five mL of a  mixture of 2 mL of methylene blue and 3 mL of injectable saline were  injected in the subareolar tissue, and breasts were gently massaged for 3  minutes.  The 2 lesions that were known to be malignant were in the upper  portion of the left breast, so I made an incision there using the 2 wires as  a guide and did a wide excision of this area.  After excising it, the  specimen was oriented for the pathologist.  Specimen mammography confirmed  the removal of the lesions.  Touch preps reported the margins as clean.  While that was being looked at, the third lesion which is in the 3 o'clock  position and was benign on original biopsies was excised in a similar  fashion, and  specimen mammogram again showed this to be in the specimen.   While those were all being looked at by the pathologist, a transverse left  axillary incision was made with dissection through the subcutaneous tissue  to the clavipectoral fascia.  Deep to the fascia was blue and hot lymph node  which we excised.  There were no other blue or palpable or hot nodes.  That  was submitted as the sentinel node which turned out to be negative for  metastatic disease.  All the incisions were closed with subcuticular 4-0  Monocryl and Steri-Strips.  Dressing was applied.  The patient was  transferred to the recovery room in satisfactory condition, having tolerated  the procedure well.     Rose Phi. Maple Hudson, M.D.  Electronically Signed    PRY/MEDQ  D:  12/24/2005  T:  12/25/2005  Job:  [id number]

## 2010-08-31 ENCOUNTER — Other Ambulatory Visit: Payer: Self-pay | Admitting: Internal Medicine

## 2010-08-31 NOTE — Telephone Encounter (Signed)
90 cc x1 and 1 teaspoon every 6 hours as needed. Office visit needed if having fever, purulence secretions, or shortness of breath.

## 2010-08-31 NOTE — Telephone Encounter (Signed)
Dr.Hopper please advise on patient's request for cough med

## 2010-09-01 NOTE — Telephone Encounter (Signed)
Left message on voice-mail with Dr. Hopper's recommendations 

## 2010-10-04 ENCOUNTER — Other Ambulatory Visit: Payer: Self-pay | Admitting: Oncology

## 2010-10-04 ENCOUNTER — Encounter (HOSPITAL_BASED_OUTPATIENT_CLINIC_OR_DEPARTMENT_OTHER): Payer: BC Managed Care – PPO | Admitting: Oncology

## 2010-10-04 DIAGNOSIS — C50119 Malignant neoplasm of central portion of unspecified female breast: Secondary | ICD-10-CM

## 2010-10-04 LAB — CHCC SMEAR

## 2010-10-04 LAB — CBC & DIFF AND RETIC
Basophils Absolute: 0 10*3/uL (ref 0.0–0.1)
EOS%: 1 % (ref 0.0–7.0)
Eosinophils Absolute: 0.1 10*3/uL (ref 0.0–0.5)
LYMPH%: 53.5 % — ABNORMAL HIGH (ref 14.0–49.7)
MCH: 27.4 pg (ref 25.1–34.0)
MCV: 84.7 fL (ref 79.5–101.0)
MONO%: 3.8 % (ref 0.0–14.0)
NEUT#: 3.9 10*3/uL (ref 1.5–6.5)
Platelets: 212 10*3/uL (ref 145–400)
RBC: 4.13 10*6/uL (ref 3.70–5.45)
nRBC: 0 % (ref 0–0)

## 2010-10-04 LAB — VITAMIN B12: Vitamin B-12: 252 pg/mL (ref 211–911)

## 2010-10-04 LAB — MORPHOLOGY: PLT EST: ADEQUATE

## 2010-10-04 LAB — FERRITIN: Ferritin: 159 ng/mL (ref 10–291)

## 2010-10-10 ENCOUNTER — Encounter: Payer: Self-pay | Admitting: Cardiovascular Disease

## 2010-11-13 ENCOUNTER — Ambulatory Visit (INDEPENDENT_AMBULATORY_CARE_PROVIDER_SITE_OTHER): Payer: BC Managed Care – PPO | Admitting: Internal Medicine

## 2010-11-13 ENCOUNTER — Encounter: Payer: Self-pay | Admitting: Internal Medicine

## 2010-11-13 DIAGNOSIS — T783XXA Angioneurotic edema, initial encounter: Secondary | ICD-10-CM

## 2010-11-13 DIAGNOSIS — I6529 Occlusion and stenosis of unspecified carotid artery: Secondary | ICD-10-CM

## 2010-11-13 DIAGNOSIS — I10 Essential (primary) hypertension: Secondary | ICD-10-CM

## 2010-11-13 DIAGNOSIS — Z853 Personal history of malignant neoplasm of breast: Secondary | ICD-10-CM

## 2010-11-13 DIAGNOSIS — E785 Hyperlipidemia, unspecified: Secondary | ICD-10-CM

## 2010-11-13 DIAGNOSIS — R609 Edema, unspecified: Secondary | ICD-10-CM

## 2010-11-13 DIAGNOSIS — F17200 Nicotine dependence, unspecified, uncomplicated: Secondary | ICD-10-CM

## 2010-11-13 MED ORDER — SPIRONOLACTONE 25 MG PO TABS: 25.0000 mg | ORAL_TABLET | Freq: Two times a day (BID) | ORAL | Status: DC

## 2010-11-13 NOTE — Patient Instructions (Signed)
Your BP goal = AVERAGE < 135/85. Avoid ingestion of  excess salt/sodium.Cook with pepper & other spices . Use the salt substitute "No Salt"(unless your potassium has been elevated) OR the Mrs Sharilyn Sites products to season food @ the table. Avoid foods which taste salty or "vinegary" as their sodium contentet will be high. Please think about quitting smoking. Review the risks we discussed. Please call 1-800-QUIT-NOW ([phone] [phone]) for free smoking cessation counseling.  Monitor B12 level annually

## 2010-11-13 NOTE — Progress Notes (Signed)
Subjective:    Patient ID: [name], female    DOB: February 22, 1946, 65 y.o.   MRN: [id number]  HPI She is here to followup on labs done 5/17 and 10/04/2010  by her Oncologist. On May 17 the hematocrit was 33 with a normal differential. Platelet count was normal as was the white count. On June 27 hematocrit was 35. Folate, ferritin, and B12 were normal ( B12 252).    Review of Systems she denies fever, chills, sweats, or weight loss. She also denies epistaxis, hemoptysis, hematemesis, abdominal pain, dysphagia, melena, rectal bleeding, or hematuria. She denies any significant bruising or abnormal bleeding.  Her last colonoscopy in 2006 was negative.  She continues to smoke approximately 5 cigarettes a day. She does have carotid stenosis which is followed by her cardiologist. Long-term risk was discussed.  Her prior advance cholesterol test was reviewed. Her LDL goal is less than 150, ideally less than 115 based on particle number and size. She has had fasting hypoglycemia; her most recent fasting glucose was 88 on May 17. With the carotid stenosis; LDL goal should be less than 100, ideally less than 70.  She has had recurrent edema for several months. She denies excess salt intake. Colas will  increase the edema. Liver function test and renal function tests were normal May 17.     Objective:   Physical Exam Gen.: Healthy and well-nourished in appearance. Alert, appropriate and cooperative throughout exam. Eyes: No corneal or conjunctival inflammation noted. Mild Arcus senilis  Neck: No deformities, masses, or tenderness noted.  Thyroid  normal. Lungs: Normal respiratory effort; chest expands symmetrically. Lungs are clear to auscultation without rales, wheezes, or increased work of breathing. Heart: Normal rate and rhythm. Normal S1 and S2. No gallop, click, or rub. Grade 1/2 -1 systolic murmur. Abdomen: Bowel sounds normal; abdomen soft and nontender. No masses, organomegaly or hernias  noted.                                                                               Musculoskeletal/extremities: No clubbing, cyanosis,  or deformity noted. Joints normal. Nail health  Good. 1/2- 1 + edema Vascular: Carotid, radial artery, dorsalis pedis and  posterior tibial pulses are full and equal. Bilateral carotid bruits  present. Neurologic: Alert and oriented x3.         Skin: Intact without suspicious lesions or rashes. No ischemia Lymph: No cervical, axillary lymphadenopathy present. Psych: Mood and affect are normal. Normally interactive                                                                                         Assessment & Plan:  #1 anemia, essentially resolved. Low normal B12 level  #2 edema with normal hepatorenal function  #3 carotid artery stenosis with an exceptionally good NMR Lipoprofile panel pattern  #4 active smoker  Plan: See orders and recommendations.

## 2010-11-30 ENCOUNTER — Ambulatory Visit
Admission: RE | Admit: 2010-11-30 | Discharge: 2010-11-30 | Disposition: A | Discharge status: Home / Self Care | Payer: BC Managed Care – PPO | Source: Ambulatory Visit | Attending: Oncology | Admitting: Oncology

## 2010-11-30 DIAGNOSIS — Z9889 Other specified postprocedural states: Secondary | ICD-10-CM

## 2010-12-21 ENCOUNTER — Other Ambulatory Visit: Payer: Self-pay | Admitting: Internal Medicine

## 2011-01-22 ENCOUNTER — Other Ambulatory Visit: Payer: Self-pay | Admitting: Family Medicine

## 2011-03-22 ENCOUNTER — Other Ambulatory Visit: Payer: Self-pay | Admitting: Internal Medicine

## 2011-04-16 ENCOUNTER — Telehealth: Payer: Self-pay | Admitting: Oncology

## 2011-04-16 NOTE — Telephone Encounter (Signed)
lmonvm for pt re appts for 1/28. Jan schedule mailed today.

## 2011-04-23 ENCOUNTER — Other Ambulatory Visit: Payer: Self-pay | Admitting: Physician Assistant

## 2011-04-23 ENCOUNTER — Other Ambulatory Visit: Payer: Self-pay | Admitting: Cardiovascular Disease

## 2011-04-23 DIAGNOSIS — C50919 Malignant neoplasm of unspecified site of unspecified female breast: Secondary | ICD-10-CM

## 2011-05-02 ENCOUNTER — Other Ambulatory Visit: Payer: Self-pay | Admitting: Cardiovascular Disease

## 2011-05-02 ENCOUNTER — Other Ambulatory Visit: Payer: Self-pay | Admitting: Oncology

## 2011-05-02 DIAGNOSIS — Z853 Personal history of malignant neoplasm of breast: Secondary | ICD-10-CM

## 2011-05-02 DIAGNOSIS — R232 Flushing: Secondary | ICD-10-CM

## 2011-05-07 ENCOUNTER — Ambulatory Visit (HOSPITAL_BASED_OUTPATIENT_CLINIC_OR_DEPARTMENT_OTHER): Payer: BC Managed Care – PPO | Admitting: Oncology

## 2011-05-07 ENCOUNTER — Other Ambulatory Visit: Payer: BC Managed Care – PPO

## 2011-05-07 ENCOUNTER — Other Ambulatory Visit: Payer: BC Managed Care – PPO | Admitting: Lab

## 2011-05-07 VITALS — BP 127/76 | HR 80 | Temp 98.2°F | Ht 64.0 in | Wt 221.4 lb

## 2011-05-07 DIAGNOSIS — Z853 Personal history of malignant neoplasm of breast: Secondary | ICD-10-CM

## 2011-05-07 DIAGNOSIS — C50919 Malignant neoplasm of unspecified site of unspecified female breast: Secondary | ICD-10-CM

## 2011-05-07 LAB — CBC WITH DIFFERENTIAL/PLATELET
BASO%: 0.8 % (ref 0.0–2.0)
HCT: 37.8 % (ref 34.8–46.6)
HGB: 12.4 g/dL (ref 11.6–15.9)
MCHC: 32.8 g/dL (ref 31.5–36.0)
MONO#: 0.5 10*3/uL (ref 0.1–0.9)
NEUT%: 48.5 % (ref 38.4–76.8)
WBC: 11.3 10*3/uL — ABNORMAL HIGH (ref 3.9–10.3)
lymph#: 5.1 10*3/uL — ABNORMAL HIGH (ref 0.9–3.3)

## 2011-05-07 LAB — COMPREHENSIVE METABOLIC PANEL
ALT: 9 U/L (ref 0–35)
Albumin: 4.5 g/dL (ref 3.5–5.2)
CO2: 22 mEq/L (ref 19–32)
Calcium: 9.7 mg/dL (ref 8.4–10.5)
Chloride: 103 mEq/L (ref 96–112)
Creatinine, Ser: 0.98 mg/dL (ref 0.50–1.10)
Total Protein: 7.8 g/dL (ref 6.0–8.3)

## 2011-05-07 NOTE — Progress Notes (Signed)
ID: [name]  DOB: June 08, 1945  MR#: [id number]  CSN#: [id number]   Interval History:   Ms. date returns today for followup of her breast cancer. The interval history is chiefly remarkable for problems with her mother, who is in her late 33s, and just had an episode of pneumonia. This, plus a cousin who is at double amputee that she helps care for, keeps her busy in addition to her work at  [employer]  ROS:  She tolerated the tamoxifen with no side effects that she is aware of. Aside from the usual aches and pains from osteoarthritis, consistent with age, and not more intense or frequent, she tells me Dr. Alwyn Ren is having her throat looked at regularly for some of mucosal changes. She is a former smoker. She tells me she had an episode of gout, worse when she uses high heels. Otherwise a detailed review of systems was noncontributory.  Allergies  Allergen Reactions  . Benazepril Hcl     REACTION: FACE SWELLING (ANGIOEDEMA); she can not take ARBS  !!!    Current Outpatient Prescriptions  Medication Sig Dispense Refill  . amLODipine (NORVASC) 10 MG tablet TAKE ONE TABLET BY MOUTH EVERY DAY  90 tablet  2  . gabapentin (NEURONTIN) 300 MG capsule TAKE ONE CAPSULE BY MOUTH EVERY DAY IN THE EVENING  30 capsule  0  . indomethacin (INDOCIN) 50 MG capsule TAKE ONE CAPSULE BY MOUTH THREE TIMES DAILY WITH FOOD AS NEEDED FOR PAIN  45 capsule  1  . omeprazole (PRILOSEC) 40 MG capsule TAKE ONE CAPSULE BY MOUTH EVERY DAY  90 capsule  0  . pravastatin (PRAVACHOL) 40 MG tablet TAKE ONE TABLET BY MOUTH EVERY DAY AT BEDTIME  90 tablet  0  . spironolactone (ALDACTONE) 25 MG tablet Take 1 tablet (25 mg total) by mouth 2 (two) times daily.  60 tablet  5  . tamoxifen (NOLVADEX) 20 MG tablet Take 20 mg by mouth daily.        . NON FORMULARY 1 pair of cushioned insoles - UAD for plantar fasciitis          Objective:  Filed Vitals:   05/07/11 1407  BP: 127/76  Pulse: 80  Temp: 98.2 F (36.8 [name])   BMI: Body mass index is 38.00 kg/(m^2).   ECOG FS: 0  Physical Exam:   Sclerae unicteric  Oropharynx clear  No peripheral adenopathy  Lungs clear -- no rales or rhonchi  Heart regular rate and rhythm  Abdomen benign  MSK no focal spinal tenderness, no peripheral edema  Neuro nonfocal  Breast exam: Right breast no suspicious findings. Left breast status post lumpectomy and radiation. There is induration beneath the scar. This is stable, measuring about 2 x 2 centimeters. There is some hyperpigmentation. There is no evidence of disease recurrence.  Lab Results:      Chemistry      Component Value Date/Time   NA 135 08/24/2010 1550   K 3.9 08/24/2010 1550   CL 98 08/24/2010 1550   CO2 26 08/24/2010 1550   BUN 15 08/24/2010 1550   CREATININE 0.78 08/24/2010 1550      Component Value Date/Time   CALCIUM 9.0 08/24/2010 1550   ALKPHOS 56 08/24/2010 1550   AST 13 08/24/2010 1550   ALT 9 08/24/2010 1550   BILITOT 0.3 08/24/2010 1550       Lab Results  Component Value Date   WBC 11.3* 05/07/2011   HGB 12.4 05/07/2011   HCT  37.8 05/07/2011   MCV 84.3 05/07/2011   PLT 250 05/07/2011   NEUTROABS 5.5 05/07/2011    Studies/Results:  She is due for mammography next month  Assessment: :  A 66 year old [name] status post left lumpectomy and sentinel lymph node dissection September 2007 for a TIB N0, grade 2, estrogen and progesterone receptor-positive, HER2/NEU-negative invasive ductal carcinoma.  She received radiation therapy and then started Arimidex January 2008,  switched to tamoxifen May 2009.   The plan is to continue tamoxifen until December of this year.  She will see me in January 2013, and at that point I expect to release her to Dr. Frederik Pear care.    Plan: She has done very well overall and at this point I am comfortable releasing her from our care. She will stop the tamoxifen when she finishes her current supply. She will be followed by Dr. Alwyn Ren of course. She knows  to call for any problems that we may be helpful with in the future.  [name] 05/07/2011

## 2011-06-04 ENCOUNTER — Other Ambulatory Visit: Payer: Self-pay | Admitting: Internal Medicine

## 2011-06-04 ENCOUNTER — Other Ambulatory Visit: Payer: Self-pay | Admitting: Oncology

## 2011-06-04 DIAGNOSIS — C50119 Malignant neoplasm of central portion of unspecified female breast: Secondary | ICD-10-CM

## 2011-06-04 NOTE — Telephone Encounter (Signed)
Dr.Hopper please advise, last OV 11/13/10, Last uric acid check 05/05/10 7.3 (H)

## 2011-06-04 NOTE — Telephone Encounter (Signed)
Nonsteroidal anti-inflammatory agents such as Indocin and should be taken as infrequently as possible & only with food in  the stomach. These are associated with an increased risk of gastric bleeding and cardiac disease if taken on a regular basis. # 21 ; OVINB

## 2011-06-21 ENCOUNTER — Encounter: Payer: Self-pay | Admitting: Internal Medicine

## 2011-06-21 ENCOUNTER — Ambulatory Visit (INDEPENDENT_AMBULATORY_CARE_PROVIDER_SITE_OTHER): Payer: BC Managed Care – PPO | Admitting: Internal Medicine

## 2011-06-21 VITALS — BP 124/84 | HR 64 | Temp 99.0°F | Wt 220.0 lb

## 2011-06-21 DIAGNOSIS — J209 Acute bronchitis, unspecified: Secondary | ICD-10-CM

## 2011-06-21 DIAGNOSIS — Z853 Personal history of malignant neoplasm of breast: Secondary | ICD-10-CM

## 2011-06-21 DIAGNOSIS — G629 Polyneuropathy, unspecified: Secondary | ICD-10-CM

## 2011-06-21 DIAGNOSIS — G589 Mononeuropathy, unspecified: Secondary | ICD-10-CM

## 2011-06-21 MED ORDER — GABAPENTIN 300 MG PO CAPS: 300.0000 mg | ORAL_CAPSULE | Freq: Three times a day (TID) | ORAL | Status: DC

## 2011-06-21 MED ORDER — HYDROCODONE-HOMATROPINE 5-1.5 MG/5ML PO SYRP: 5.0000 mL | ORAL_SOLUTION | Freq: Four times a day (QID) | ORAL | Status: AC | PRN

## 2011-06-21 MED ORDER — SULFAMETHOXAZOLE-TRIMETHOPRIM 800-160 MG PO TABS: 1.0000 | ORAL_TABLET | Freq: Two times a day (BID) | ORAL | Status: AC

## 2011-06-21 NOTE — Patient Instructions (Signed)
Plain Mucinex for thick secretions ;force NON dairy fluids . Use a Neti pot daily as needed for sinus congestion  Please think about quitting smoking. Review the risks we discussed. Please call 1-800-QUIT-NOW [phone]) for free smoking cessation counseling.

## 2011-06-21 NOTE — Progress Notes (Signed)
  Subjective:    Patient ID: [name], female    DOB: 08/21/1945, 66 y.o.   MRN: [id number]  HPI   HPI: Onset/first symptoms: Started Sunday (4 days ago) with cough, sneezing, headache, and pain with deep inspiration. Trigger/exposures:no Progression of symptoms: still having productive cough,  sneezing, and headache Treatment and Response: took gabapentin and indomethacin - relieved pain with deep inspiration Present symptoms:  Fever/chills/seats:no Frontal headache:no Facial pain:no Nasal purulence: no Sore throat: no Dental pain:no Lymphadenopathy:no Wheezing/Shortness of breath: some wheezing but denies shortness of breath Cough/sputum/hemoptysis: productive yellow sputum especially at night   PMH (ex: seasonal allergies, asthma, smoking etc.):No seasonal allergies or asthma. Pt smokes approximately 3 cigarettes per day.   Review of Systems No abd pain or change in stools.    Objective:   Physical Exam General appearance:good health ;well nourished; no acute distress or increased work of breathing is present.  No  lymphadenopathy about the head, neck, or axilla noted.   Eyes: No conjunctival inflammation or lid edema is present.   Ears:  External ear exam shows no significant lesions or deformities.  Otoscopic examination reveals clear canals, tympanic membranes are intact bilaterally without bulging, retraction, inflammation or discharge.  Nose:  External nasal examination shows no deformity or inflammation. Nasal mucosa are dry  without lesions or exudates. No septal dislocation or deviation.No obstruction to airflow.   Oral exam: Dental hygiene is good; lips and gums are healthy appearing.There is no oropharyngeal erythema or exudate noted.     Heart:  Normal rate and regular rhythm. S1 and S2 normal without gallop, murmur, click, rub or other extra sounds.   Lungs:Chest clear to auscultation; no wheezes, rhonchi,rales ,or rubs present.No increased work of  breathing.    Extremities:  No cyanosis, edema, or clubbing  noted    Skin: Warm & dry       Assessment & Plan:  #1 acute bronchitis w/o bronchospam Plan: See orders and recommendations

## 2011-07-24 ENCOUNTER — Other Ambulatory Visit: Payer: Self-pay | Admitting: Internal Medicine

## 2011-07-24 NOTE — Telephone Encounter (Signed)
D.Hopper please advise, last request for this medication indicated OV if no better. Patient had recent OV 06/21/11 but it was not associated to this medication. Please advise

## 2011-07-24 NOTE — Telephone Encounter (Signed)
This medication should not be used routinely because of increased risk of gastritis or even ulcer. Long-term use also has  increased cardiovascular risk. Office visit recommended if having ongoing joint issues. Arthritis strength Tylenol is the safest agent to use. Use an anti-inflammatory cream such as Aspercreme or Zostrix cream twice a day to the affected joint as needed. In lieu of this warm moist compresses or  hot water bottle can be used. Do not apply ice .

## 2011-07-24 NOTE — Telephone Encounter (Signed)
Left message to call office

## 2011-07-31 NOTE — Telephone Encounter (Signed)
Left message to call office

## 2011-08-02 ENCOUNTER — Other Ambulatory Visit: Payer: Self-pay | Admitting: Physician Assistant

## 2011-08-02 NOTE — Telephone Encounter (Signed)
Spoke with patient, patient verbalized understanding of all information. Patient stated she will call back if she feels the need for an OV

## 2011-08-02 NOTE — Telephone Encounter (Signed)
Pt return call,Left message to call office.  

## 2011-08-09 ENCOUNTER — Other Ambulatory Visit: Payer: Self-pay | Admitting: Cardiovascular Disease

## 2011-08-09 ENCOUNTER — Other Ambulatory Visit: Payer: Self-pay | Admitting: Physician Assistant

## 2011-08-09 DIAGNOSIS — C50919 Malignant neoplasm of unspecified site of unspecified female breast: Secondary | ICD-10-CM

## 2011-08-09 NOTE — Telephone Encounter (Signed)
Error

## 2011-08-10 MED ORDER — OMEPRAZOLE 40 MG PO CPDR: DELAYED_RELEASE_CAPSULE | ORAL | Status: DC

## 2011-08-10 NOTE — Telephone Encounter (Signed)
Patient states she needs this medication and D.Allyson Sabal denied it. The prescription stated denied needs to be filled by Dr. Alwyn Ren. Can you review & advise if this is an option and send to  [facility] - [place], [place] - [address] If Dr.Hopper approves? Can call patient at work [phone]

## 2011-08-10 NOTE — Telephone Encounter (Signed)
Addended by: Maurice Small on: 08/10/2011 03:19 PM   Modules accepted: Orders

## 2011-10-17 ENCOUNTER — Ambulatory Visit (INDEPENDENT_AMBULATORY_CARE_PROVIDER_SITE_OTHER): Payer: BC Managed Care – PPO | Admitting: Cardiovascular Disease

## 2011-10-17 ENCOUNTER — Encounter: Payer: Self-pay | Admitting: Cardiovascular Disease

## 2011-10-17 VITALS — BP 168/90 | HR 78 | Ht 64.0 in | Wt 221.0 lb

## 2011-10-17 DIAGNOSIS — E7800 Pure hypercholesterolemia, unspecified: Secondary | ICD-10-CM

## 2011-10-17 DIAGNOSIS — I6529 Occlusion and stenosis of unspecified carotid artery: Secondary | ICD-10-CM

## 2011-10-17 MED ORDER — LABETALOL HCL 100 MG PO TABS: 100.0000 mg | ORAL_TABLET | Freq: Two times a day (BID) | ORAL | Status: DC

## 2011-10-17 NOTE — Patient Instructions (Addendum)
Your physician has recommended you make the following change in your medication: START Labetalol 100mg  take one by mouth twice a day  Your physician has requested that you have a carotid duplex. This test is an ultrasound of the carotid arteries in your neck. It looks at blood flow through these arteries that supply the brain with blood. Allow one hour for this exam. There are no restrictions or special instructions.  Your physician wants you to follow-up in: 1 YEAR.  You will receive a reminder letter in the mail two months in advance. If you don't receive a letter, please call our office to schedule the follow-up appointment.  Your physician recommends that you return for a FASTING Lipid and Liver profile--nothing to eat or drink after midnight, lab opens at 8:30.

## 2011-10-18 ENCOUNTER — Other Ambulatory Visit (INDEPENDENT_AMBULATORY_CARE_PROVIDER_SITE_OTHER): Payer: BC Managed Care – PPO

## 2011-10-18 DIAGNOSIS — E7800 Pure hypercholesterolemia, unspecified: Secondary | ICD-10-CM

## 2011-10-18 DIAGNOSIS — I6529 Occlusion and stenosis of unspecified carotid artery: Secondary | ICD-10-CM

## 2011-10-18 LAB — LIPID PANEL
LDL Cholesterol: 86 mg/dL (ref 0–99)
VLDL: 27 mg/dL (ref 0.0–40.0)

## 2011-10-18 LAB — HEPATIC FUNCTION PANEL: Albumin: 3.7 g/dL (ref 3.5–5.2)

## 2011-10-21 ENCOUNTER — Encounter: Payer: Self-pay | Admitting: Cardiovascular Disease

## 2011-10-21 NOTE — Progress Notes (Signed)
HPI:  66 year old woman presenting for followup evaluation. The patient is followed for asymptomatic carotid stenosis. She reports that she's been feeling fairly well. She continues to work at [employer]. She works 10 hour shifts. She's not been exercising regularly. She denies chest pain or pressure, dyspnea, edema, palpitations, lightheadedness, or syncope. She's had no neurologic symptoms. She's compliant with her medications.  Outpatient Encounter Prescriptions as of 10/17/2011  Medication Sig Dispense Refill  . amLODipine (NORVASC) 10 MG tablet TAKE ONE TABLET BY MOUTH EVERY DAY  90 tablet  2  . indomethacin (INDOCIN) 50 MG capsule TAKE ONE CAPSULE BY MOUTH THREE TIMES DAILY WITH FOOD AS NEEDED FOR PAIN  21 capsule  0  . omeprazole (PRILOSEC) 40 MG capsule TAKE ONE CAPSULE BY MOUTH EVERY DAY  90 capsule  0  . pravastatin (PRAVACHOL) 40 MG tablet TAKE ONE TABLET BY MOUTH EVERY DAY AT BEDTIME  90 tablet  0  . spironolactone (ALDACTONE) 25 MG tablet Take 1 tablet (25 mg total) by mouth 2 (two) times daily.  60 tablet  5  . DISCONTD: gabapentin (NEURONTIN) 300 MG capsule Take 1 capsule (300 mg total) by mouth 3 (three) times daily.  90 capsule  2  . labetalol (NORMODYNE) 100 MG tablet Take 1 tablet (100 mg total) by mouth 2 (two) times daily.  180 tablet  3  . DISCONTD: tamoxifen (NOLVADEX) 20 MG tablet Take 20 mg by mouth daily.          Allergies  Allergen Reactions  . Benazepril Hcl     REACTION: FACE SWELLING (ANGIOEDEMA); she can not take ARBS  !!!    Past Medical History  Diagnosis Date  . Occlusion and stenosis of carotid artery without mention of cerebral infarction   . HLD (hyperlipidemia)   . Unspecified essential hypertension   . Other abnormal glucose     fasting hyperglycemia  . Angioneurotic edema not elsewhere classified     due to ACE-I  . Personal history of malignant neoplasm of breast   . Absence of menstruation     amenorrhea  . H/O: rheumatic fever       ROS: Negative except as per HPI  BP 168/90  Pulse 78  Ht 5\' 4"  (1.626 m)  Wt 100.245 kg (221 lb)  BMI 37.93 kg/m2  PHYSICAL EXAM: Pt is alert and oriented, pleasant, obese woman in NAD HEENT: normal Neck: JVP - normal, carotids 2+= with soft bilateral bruits Lungs: CTA bilaterally CV: RRR without murmur or gallop Abd: soft, NT, Positive BS, no hepatomegaly Ext: no C/C/E, distal pulses intact and equal Skin: warm/dry no rash  EKG:  Normal sinus rhythm 72 beats per minute, cannot rule out anterior infarct age undetermined, otherwise within normal limits.  ASSESSMENT AND PLAN: 1. Asymptomatic carotid stenosis. The patient's last carotid duplex scan was reviewed from 2011. This demonstrated 60-79% right internal carotid artery stenosis and 40-59% left internal carotid artery stenosis. The patient has no stroke or TIA symptoms. She is due for a followup carotid duplex scan and this will be arranged. She was educated about symptoms of stroke. She is appropriately on a statin drug. I do not see any antiplatelet therapy on her medicine list will need to confirm whether she is taking aspirin. She has not, this will be started at a dose of 81 mg daily.  2. Hypertension with suboptimal control. The patient is on a combination of amlodipine and Aldactone. She will be started on labetalol as her blood  pressure does not appear to be well controlled.  3. Hyperlipidemia. Lipids were recently checked and demonstrated a cholesterol of 162, triglycerides 135, HDL 49, and LDL 86. Her LFTs were within normal limits. She's on a statin drug.  For followup, she will have a carotid duplex scan in the near future and I would like to see her back in 12 months unless problems arise in the interim.  Tonny Bollman 10/21/2011 8:38 AM

## 2011-10-23 ENCOUNTER — Encounter (INDEPENDENT_AMBULATORY_CARE_PROVIDER_SITE_OTHER): Payer: BC Managed Care – PPO

## 2011-10-23 DIAGNOSIS — E7800 Pure hypercholesterolemia, unspecified: Secondary | ICD-10-CM

## 2011-10-23 DIAGNOSIS — I6529 Occlusion and stenosis of unspecified carotid artery: Secondary | ICD-10-CM

## 2011-10-25 ENCOUNTER — Telehealth: Payer: Self-pay | Admitting: Cardiovascular Disease

## 2011-10-30 ENCOUNTER — Other Ambulatory Visit: Payer: Self-pay | Admitting: Internal Medicine

## 2011-10-31 ENCOUNTER — Other Ambulatory Visit: Payer: Self-pay | Admitting: Internal Medicine

## 2011-10-31 NOTE — Telephone Encounter (Signed)
Pt needs order for bone density scan. She goes to [facility].  Call pt back at [phone]-

## 2011-10-31 NOTE — Telephone Encounter (Signed)
Left message on voicemail for patient to return call to discuss medication ( not on med list)

## 2011-10-31 NOTE — Telephone Encounter (Signed)
NEURONTIN 300MG  CAP QTY:90 TAKE ONE CAPSULE BY MOUTH THREE TIMES DAILY

## 2011-11-01 ENCOUNTER — Telehealth: Payer: Self-pay | Admitting: Internal Medicine

## 2011-11-01 ENCOUNTER — Other Ambulatory Visit: Payer: Self-pay | Admitting: Internal Medicine

## 2011-11-01 DIAGNOSIS — Z780 Asymptomatic menopausal state: Secondary | ICD-10-CM

## 2011-11-01 DIAGNOSIS — I10 Essential (primary) hypertension: Secondary | ICD-10-CM

## 2011-11-01 DIAGNOSIS — R609 Edema, unspecified: Secondary | ICD-10-CM

## 2011-11-01 DIAGNOSIS — Z853 Personal history of malignant neoplasm of breast: Secondary | ICD-10-CM

## 2011-11-01 MED ORDER — SPIRONOLACTONE 25 MG PO TABS: 25.0000 mg | ORAL_TABLET | Freq: Two times a day (BID) | ORAL | Status: DC

## 2011-11-01 MED ORDER — GABAPENTIN 300 MG PO CAPS: 300.0000 mg | ORAL_CAPSULE | Freq: Three times a day (TID) | ORAL | Status: DC

## 2011-11-01 NOTE — Telephone Encounter (Signed)
OK # 270, 1 q 8 hrs prn

## 2011-11-01 NOTE — Telephone Encounter (Signed)
Left message on voicemail for patient to return call when available   

## 2011-11-01 NOTE — Telephone Encounter (Signed)
Spoke with patient, patient states she is taking Gabapentin(Neurontin). Patient states because we had not responded to refill request the pharmacy sent request to oncologist and they approved for a #30 pills . Patient would still like Dr.Hopper to approve a 90 day supply so that she is not running to the pharmacy every 30 days, this would be 270 pills  Dr.Hopper please advise

## 2011-11-01 NOTE — Telephone Encounter (Signed)
Spoke with patient, BMD order placed, rx for spironolactone sent over. Patient was offered appointment for stiff/achy concerns and stated she will she what BMD reveals first then schedule appointment if needed

## 2011-11-01 NOTE — Telephone Encounter (Signed)
Caller: [name]/Patient; PCP: Marga Melnick; CB#: [phone];  Call regarding Returning Call To Office Re: Meds. Orders checked in EPIC and let her know that Neurontin WAS called in. She will be having  Mamogram At [facility] on [facility] (She will be Calling today to set up appnt) - Would like Bone Density done at the same time. Sees Dr. Lesle Reek. Needs REFERRAL FOR Bone Density. PLEASE HAVE DR. HOPPER SEND ORDER TO DR. Manson Passey AND GIVE [name] CALL BACK ONCE ORDER SENT. She is concerned about her feeling stiff/ achy in the hips at times- especially in the mornings and in the evenings. PCP Call -no triage. ALSO NEEDS REFILL OF SPIROLACTONE- ONLY 5 PILLS LEFT.

## 2011-11-05 ENCOUNTER — Other Ambulatory Visit: Payer: Self-pay | Admitting: Physician Assistant

## 2011-11-05 NOTE — Telephone Encounter (Signed)
Additional refills should be through PCP

## 2011-11-19 ENCOUNTER — Other Ambulatory Visit: Payer: Self-pay | Admitting: Internal Medicine

## 2011-11-26 ENCOUNTER — Other Ambulatory Visit: Payer: Self-pay | Admitting: Internal Medicine

## 2011-11-27 NOTE — Telephone Encounter (Signed)
Refill done.  

## 2011-12-03 ENCOUNTER — Ambulatory Visit
Admission: RE | Admit: 2011-12-03 | Discharge: 2011-12-03 | Disposition: A | Discharge status: Home / Self Care | Payer: BC Managed Care – PPO | Source: Ambulatory Visit | Attending: Internal Medicine | Admitting: Internal Medicine

## 2011-12-03 DIAGNOSIS — Z853 Personal history of malignant neoplasm of breast: Secondary | ICD-10-CM

## 2012-01-08 ENCOUNTER — Other Ambulatory Visit: Payer: Self-pay | Admitting: Cardiovascular Disease

## 2012-02-11 ENCOUNTER — Telehealth: Payer: Self-pay

## 2012-02-11 NOTE — Telephone Encounter (Signed)
Message copied by Maurice Small on Mon Feb 11, 2012  9:16 AM ------      Message from: Pecola Lawless      Created: Mon Feb 11, 2012  6:46 AM       Please verify diagnosis for which gabapentin 3 mg TID is needed is taken. It will be entered into her problem list

## 2012-02-11 NOTE — Telephone Encounter (Signed)
Called home number, unable to leave message on voicemail. Called work number (mailbox full), I will try to reach patient again later

## 2012-02-12 NOTE — Telephone Encounter (Signed)
Called home number again, spoke with patient. Patient was unsure of this medication and indicated she will call me once she gets her medication bottles in front of her

## 2012-02-12 NOTE — Telephone Encounter (Signed)
Called patient again(home), VM not set up.  Called work number, Technical brewer full. I will try to reach patient again later

## 2012-02-14 ENCOUNTER — Telehealth: Payer: Self-pay

## 2012-02-14 NOTE — Telephone Encounter (Signed)
Pt called [facility] stating wanted to talk to you concerning medication you all talked about.  Plz advise pt [phone]    MW

## 2012-02-14 NOTE — Telephone Encounter (Signed)
Left message on voicemail for patient to return call when available   

## 2012-02-15 NOTE — Telephone Encounter (Signed)
Left message on voicemail for patient to return call when available   

## 2012-02-15 NOTE — Telephone Encounter (Signed)
See phone note dated 02/14/2012

## 2012-02-18 ENCOUNTER — Other Ambulatory Visit: Payer: BC Managed Care – PPO

## 2012-02-18 NOTE — Telephone Encounter (Signed)
Spoke with patient she is taking gabapentin. Med was originally rx'ed by Dr.Magrniat for irritation following breast cancer surgery.

## 2012-02-25 ENCOUNTER — Ambulatory Visit
Admission: RE | Admit: 2012-02-25 | Discharge: 2012-02-25 | Disposition: A | Discharge status: Home / Self Care | Payer: BC Managed Care – PPO | Source: Ambulatory Visit | Attending: Internal Medicine | Admitting: Internal Medicine

## 2012-02-25 DIAGNOSIS — Z780 Asymptomatic menopausal state: Secondary | ICD-10-CM

## 2012-03-16 ENCOUNTER — Other Ambulatory Visit: Payer: Self-pay | Admitting: Internal Medicine

## 2012-07-03 ENCOUNTER — Encounter (INDEPENDENT_AMBULATORY_CARE_PROVIDER_SITE_OTHER): Payer: BC Managed Care – PPO

## 2012-07-03 DIAGNOSIS — I6529 Occlusion and stenosis of unspecified carotid artery: Secondary | ICD-10-CM

## 2012-07-04 ENCOUNTER — Encounter: Payer: Self-pay | Admitting: Lab

## 2012-07-07 ENCOUNTER — Encounter: Payer: Self-pay | Admitting: Internal Medicine

## 2012-07-07 ENCOUNTER — Ambulatory Visit (INDEPENDENT_AMBULATORY_CARE_PROVIDER_SITE_OTHER): Payer: BC Managed Care – PPO | Admitting: Internal Medicine

## 2012-07-07 VITALS — BP 136/88 | HR 67 | Wt 217.0 lb

## 2012-07-07 DIAGNOSIS — L80 Vitiligo: Secondary | ICD-10-CM | POA: Insufficient documentation

## 2012-07-07 LAB — T3, FREE: T3, Free: 3.4 pg/mL (ref 2.3–4.2)

## 2012-07-07 NOTE — Progress Notes (Signed)
  Subjective:    Patient ID: [name], female    DOB: 1945/06/03, 67 y.o.   MRN: [id number]  HPI She noted perineal area vitiligo as of 03/2012. This was evaluated by her Gyn , Dr. Cherly Hensen this month. Dr. Cherly Hensen asked her to have an evaluation for possible thyroid disorder.  She denies using any topical agents on this area. There is no family history of vitiligo or thyroid disorder    Review of Systems  Constitutional: No significant change in weight; significant fatigue; sleep disorder; change in appetite. Eye: occasiona blurred vision w/o double vision or loss of vision Cardiovascular: no palpitations; racing; irregularity ENT/GI: no constipation; diarrhea;hoarseness Derm: no change in nails,hair Neuro: no numbness or tingling; tremor Psych:no anxiety; depression; panic attacks MS: no arthralgias Endo:  temperature intolerance to cold, not to heat       Objective:   Physical Exam Gen.:  well-nourished; in no acute distress Eyes: Extraocular motion intact; no lid lag or proptosis ,nystagmus.  Neck: full ROM; no masses ; thyroid normal  Heart: Normal rhythm and rate with Grade 1/2 systolic murmur. No gallop or extra heart sounds. Very faint carotid bruits Lungs: Chest clear to auscultation without rales,rales, wheezes Neuro:Deep tendon reflexes are equal and within normal limits; no tremor  Skin: Warm and dry without significant lesions or rashes; no onycholysis Lymphatic: no cervical or axillary LA Psych: Normally communicative and interactive; no abnormal mood or affect clinically.          Assessment & Plan:

## 2012-07-07 NOTE — Patient Instructions (Addendum)
Because of a history of documented adverse serious drug reaction;Medi Alert bracelet  is recommended for "ACE-Inhibitors AND Angiotenson Receptor Blockers".  If you activate the  My Chart system; lab & Xray results will be released directly  to you as soon as I review & address these through the computer. If you choose not to sign up for My Chart within 36 hours of labs being drawn; results will be reviewed & interpretation added before being copied & mailed, causing a delay in getting the results to you.If you do not receive that report within 7-10 days ,please call. Additionally you can use this system to gain direct  access to your records  if  out of town or @ an office of a  physician who is not in  the My Chart network.  This improves continuity of care & places you in control of your medical record.

## 2012-07-07 NOTE — Assessment & Plan Note (Signed)
Thyroid function test will be performed. If these are negative; dermatologic referral indicated.

## 2012-07-16 ENCOUNTER — Other Ambulatory Visit: Payer: Self-pay | Admitting: Internal Medicine

## 2012-07-31 ENCOUNTER — Encounter: Payer: Self-pay | Admitting: Internal Medicine

## 2012-07-31 ENCOUNTER — Ambulatory Visit (INDEPENDENT_AMBULATORY_CARE_PROVIDER_SITE_OTHER): Payer: BC Managed Care – PPO | Admitting: Internal Medicine

## 2012-07-31 VITALS — BP 124/80 | HR 69 | Temp 98.5°F | Resp 14 | Ht 64.08 in | Wt 221.2 lb

## 2012-07-31 DIAGNOSIS — H9222 Otorrhagia, left ear: Secondary | ICD-10-CM

## 2012-07-31 DIAGNOSIS — Z0000 Encounter for general adult medical examination without abnormal findings: Secondary | ICD-10-CM

## 2012-07-31 DIAGNOSIS — Z139 Encounter for screening, unspecified: Secondary | ICD-10-CM

## 2012-07-31 DIAGNOSIS — N912 Amenorrhea, unspecified: Secondary | ICD-10-CM

## 2012-07-31 DIAGNOSIS — H9210 Otorrhea, unspecified ear: Secondary | ICD-10-CM

## 2012-07-31 DIAGNOSIS — E785 Hyperlipidemia, unspecified: Secondary | ICD-10-CM

## 2012-07-31 DIAGNOSIS — I10 Essential (primary) hypertension: Secondary | ICD-10-CM

## 2012-07-31 NOTE — Progress Notes (Signed)
Subjective:    Patient ID: [name], female    DOB: 1945-09-28, 67 y.o.   MRN: [id number]  HPI Medicare Wellness Visit:  Psychosocial & medical history were reviewed as required by Medicare (abuse,antisocial behavioral risks,firearm risk).  Social history: caffeine: 1 cup /day , alcohol: rarely  ,  tobacco use:   4 cigs/ day Exercise :  Walking just started No home & personal  safety / fall risk Activities of daily living: no limitations  Seatbelt  and smoke alarm employed. Power of Attorney/Living Will status : needed Ophthalmology exam current Hearing evaluation not current Orientation :oriented X 3  Memory & recall :good Spelling  testing:good Mood & affect : normal . Depression / anxiety: denied Travel history :12s [place] Immunization status :Flu/ tetanus refused Transfusion history:  none  Preventive health surveillance ( colonoscopies, BMD , mammograms,PAP as per protocol/ SOC): current  Dental care:  Every 6 mos. Chart reviewed &  Updated. Active issues reviewed & addressed.      Review of Systems CHRONIC HYPERTENSION follow-up: Home blood pressure range  not monitored Patient is compliant with medications No adverse effects noted from medication Avoids excess salt .  No chest pain, palpitations, dyspnea, claudication,or paroxysmal nocturnal dyspnea described. Intermittent RLE edema. No significant lightheadedness, headache, epistaxis, or syncope  Bleeding L ear after using Q tip this am.She denies epistaxis, hemoptysis, hematuria, melena, or rectal bleeding.She has no unexplained weight loss, dysphagia, or abdominal pain. She has no abnormal bruising or bleeding. No difficulty stopping bleeding with injury.          Objective:   Physical Exam Gen.: Healthy and well-nourished in appearance. Alert, appropriate and cooperative throughout exam. Head: Normocephalic without obvious abnormalities Eyes: No corneal or conjunctival inflammation noted. Pupils  equal round reactive to light and accommodation. Fundal exam is benign without hemorrhages, exudate, papilledema. Extraocular motion intact. Vision grossly normal without lenses. Arcus senilis Ears: External  ear exam reveals no significant lesions or deformities. Blood & mucus L canal.. Hearing is grossly decreased on L. Nose: External nasal exam reveals no deformity or inflammation. Nasal mucosa are pink and moist. No lesions or exudates noted. Mouth: Oral mucosa and oropharynx reveal no lesions or exudates. Teeth in good repair. Neck: No deformities, masses, or tenderness noted. Range of motion & Thyroid normal. Lungs: Normal respiratory effort; chest expands symmetrically. Lungs are clear to auscultation without rales, wheezes, or increased work of breathing. Heart: Normal rate and rhythm. Normal S1 and S2. No gallop, click, or rub. Grade 1/2 over 6 systolic murmur . Abdomen: Bowel sounds normal; abdomen soft and nontender. No masses, organomegaly or hernias noted. Genitalia: As per Gyn                                  Musculoskeletal/extremities:Slightly accentuated curvature of upper thoracic  Spine. No clubbing, cyanosis, edema, or significant extremity  deformity noted. Range of motion normal .Tone & strength  Normal. Joints normal. Nail health good. Able to lie down & sit up w/o help. Negative SLR bilaterally Vascular: Carotid, radial artery, dorsalis pedis and  posterior tibial pulses are full and equal. Faint L carotid bruit present. Neurologic: Alert and oriented x3. Deep tendon reflexes symmetrical and normal.  Gait normal  including heel & toe walking .        Skin: Intact without suspicious lesions or rashes. Lymph: No cervical, axillary lymphadenopathy present. Psych: Mood and affect are normal. Normally  interactive                                                                                      Assessment & Plan:  #1 Medicare Wellness Exam; criteria met ; data  entered #2 Problem List reviewed ; Assessment/ Recommendations made #3 otic bleeding , probably post taumatic Plan: see Orders

## 2012-07-31 NOTE — Patient Instructions (Addendum)
Preventive Health Care: Exercise  30-45  minutes a day, 3-4 days a week. Walking is especially valuable in preventing Osteoporosis. Eat a low-fat diet with lots of fruits and vegetables, up to 7-9 servings per day. This would eliminate need for vitamin supplements for most individuals. Consume less than 30 grams of sugar per day from foods & drinks with High Fructose Corn Syrup as #2,3 or #4 on label. The legal document "Health Care Power of Attorney & Living Will " verifies decisions concerning your health care. Plain Mucinex (NOT D) for thick secretions ;force NON dairy fluids .   Nasal cleansing in the shower as discussed with lather of mild shampoo.After 10 seconds wash off lather while  exhaling through nostrils. Make sure that all residual soap is removed to prevent irritation.  Nasonex 1 spray in each nostril twice a day as needed. Use the "crossover" technique into opposite nostril spraying toward opposite ear @ 45 degree angle, not straight up into nostril.  Use a Neti pot daily only  as needed for significant sinus congestion; going from open side to congested side . Plain Allegra (NOT D )  160 daily , Loratidine 10 mg , OR Zyrtec 10 mg @ bedtime  as needed for itchy eyes & sneezing. Zicam Melts or Zinc lozenges as per package label for scratchy throat . Complementary options include  vitamin C 2000 mg daily; & Echinacea for 4-7 days. Report persistent or progressive fever; discolored nasal or chest secretions; or frontal headache or facial  pain.  Please review the record and make any corrections; share this with all medical staff seen. If you note change or progression in symptoms please contact us through My Chart ASAP. This will allow [place] to respond as quickly as possible and schedule appropriate studies (blood test or imaging).

## 2012-08-01 LAB — LIPID PANEL
Cholesterol: 208 mg/dL — ABNORMAL HIGH (ref 0–200)
HDL: 43.8 mg/dL (ref >39.00)
Total CHOL/HDL Ratio: 5
VLDL: 30.8 mg/dL (ref 0.0–40.0)

## 2012-08-01 LAB — HEPATIC FUNCTION PANEL
AST: 14 U/L (ref 0–37)
Albumin: 4.1 g/dL (ref 3.5–5.2)
Alkaline Phosphatase: 88 U/L (ref 39–117)
Total Bilirubin: 0.3 mg/dL (ref 0.3–1.2)

## 2012-08-01 LAB — CBC WITH DIFFERENTIAL/PLATELET
Basophils Absolute: 0.1 10*3/uL (ref 0.0–0.1)
Eosinophils Absolute: 0.2 10*3/uL (ref 0.0–0.7)
Hemoglobin: 13.1 g/dL (ref 12.0–15.0)
Lymphocytes Relative: 43.5 % (ref 12.0–46.0)
MCHC: 33 g/dL (ref 30.0–36.0)
Monocytes Relative: 4.6 % (ref 3.0–12.0)
Neutro Abs: 4.7 10*3/uL (ref 1.4–7.7)
Platelets: 244 10*3/uL (ref 150.0–400.0)
RDW: 14 % (ref 11.5–14.6)

## 2012-08-01 LAB — BASIC METABOLIC PANEL
BUN: 11 mg/dL (ref 6–23)
CO2: 30 mEq/L (ref 19–32)
Calcium: 9 mg/dL (ref 8.4–10.5)
Creatinine, Ser: 0.8 mg/dL (ref 0.4–1.2)
GFR: 91.22 mL/min (ref >60.00)
Glucose, Bld: 84 mg/dL (ref 70–99)
Sodium: 137 mEq/L (ref 135–145)

## 2012-08-06 ENCOUNTER — Encounter: Payer: BC Managed Care – PPO | Admitting: Internal Medicine

## 2012-08-07 ENCOUNTER — Encounter: Payer: Self-pay | Admitting: Internal Medicine

## 2012-10-28 ENCOUNTER — Other Ambulatory Visit: Payer: Self-pay | Admitting: Internal Medicine

## 2012-10-28 DIAGNOSIS — Z853 Personal history of malignant neoplasm of breast: Secondary | ICD-10-CM

## 2012-10-28 DIAGNOSIS — Z9889 Other specified postprocedural states: Secondary | ICD-10-CM

## 2012-10-30 ENCOUNTER — Encounter: Payer: Self-pay | Admitting: Internal Medicine

## 2012-10-30 ENCOUNTER — Other Ambulatory Visit: Payer: Self-pay | Admitting: Internal Medicine

## 2012-10-30 DIAGNOSIS — L80 Vitiligo: Secondary | ICD-10-CM

## 2012-11-20 ENCOUNTER — Other Ambulatory Visit: Payer: Self-pay | Admitting: Internal Medicine

## 2012-11-28 ENCOUNTER — Ambulatory Visit (INDEPENDENT_AMBULATORY_CARE_PROVIDER_SITE_OTHER): Payer: BC Managed Care – PPO | Admitting: Cardiovascular Disease

## 2012-11-28 ENCOUNTER — Encounter: Payer: Self-pay | Admitting: Cardiovascular Disease

## 2012-11-28 VITALS — BP 146/82 | HR 54 | Ht 64.0 in | Wt 220.0 lb

## 2012-11-28 DIAGNOSIS — E785 Hyperlipidemia, unspecified: Secondary | ICD-10-CM

## 2012-11-28 DIAGNOSIS — I6529 Occlusion and stenosis of unspecified carotid artery: Secondary | ICD-10-CM

## 2012-11-28 DIAGNOSIS — I10 Essential (primary) hypertension: Secondary | ICD-10-CM

## 2012-11-28 MED ORDER — ASPIRIN EC 81 MG PO TBEC: 81.0000 mg | DELAYED_RELEASE_TABLET | Freq: Every day | ORAL | Status: AC

## 2012-11-28 MED ORDER — ATORVASTATIN CALCIUM 20 MG PO TABS: 20.0000 mg | ORAL_TABLET | Freq: Every day | ORAL | Status: DC

## 2012-11-28 NOTE — Patient Instructions (Addendum)
Your physician has recommended you make the following change in your medication: START Aspirin 81mg  take one by mouth daily, START Atorvastatin 20mg  take one by mouth every evening  Your physician recommends that you return for a FASTING LIPID and LIVER profile in 3 MONTHS--nothing to eat or drink after midnight, lab opens at 7:30  Your physician has requested that you have a carotid duplex in [facility] 2015. This test is an ultrasound of the carotid arteries in your neck. It looks at blood flow through these arteries that supply the brain with blood. Allow one hour for this exam. There are no restrictions or special instructions.  Your physician wants you to follow-up in: 1 YEAR with Dr Excell Seltzer.  You will receive a reminder letter in the mail two months in advance. If you don't receive a letter, please call our office to schedule the follow-up appointment.

## 2012-11-28 NOTE — Progress Notes (Signed)
   HPI:  67 year old woman presenting for followup evaluation.she's followed for asymptomatic carotid stenosis.she also has hypertension hyperlipidemia. She's previously been on pravastatin. She had cholesterol checked in April of this year and demonstrated a total cholesterol 208, triglycerides 154, HDL 44, and LDL 142.last carotid duplex in April 2014 showed 60-79% stenosis on the right and 40-59% stenosis on the left.  From a symptomatic perspective she is doing fine. She denies chest pain or dyspnea. She's not engaged in regular exercise. She still smokes about 4-5 cigarettes daily. She denies any focal neurologic deficits. She denies amaurosis or expressive aphasia.  Outpatient Encounter Prescriptions as of 11/28/2012  Medication Sig Dispense Refill  . amLODipine (NORVASC) 10 MG tablet TAKE ONE TABLET BY MOUTH EVERY DAY  90 tablet  1  . CITRACAL PETITES/VITAMIN D 200-250 MG-UNIT TABS TAKE TWO TO THREE TABLETS BY MOUTH TWICE DAILY  600 each  0  . labetalol (NORMODYNE) 100 MG tablet Take 1 tablet (100 mg total) by mouth 2 (two) times daily.  180 tablet  3  . omeprazole (PRILOSEC) 40 MG capsule TAKE ONE CAPSULE BY MOUTH EVERY DAY  90 capsule  1  . spironolactone (ALDACTONE) 25 MG tablet TAKE ONE TABLET BY MOUTH TWICE DAILY  60 tablet  11   No facility-administered encounter medications on file as of 11/28/2012.    Allergies  Allergen Reactions  . Benazepril Hcl     REACTION: FACE SWELLING (ANGIOEDEMA); she can not take ARBS  !!!    Past Medical History  Diagnosis Date  . Occlusion and stenosis of carotid artery without mention of cerebral infarction   . HLD (hyperlipidemia)   . Unspecified essential hypertension   . Other abnormal glucose     fasting hyperglycemia  . Angioneurotic edema not elsewhere classified     due to ACE-I  . Personal history of malignant neoplasm of breast   . Absence of menstruation     amenorrhea  . H/O: rheumatic fever     ROS: Negative except as per  HPI  BP 146/82  Pulse 54  Ht 5\' 4"  (1.626 m)  Wt 220 lb (99.791 kg)  BMI 37.74 kg/m2  SpO2 93%  PHYSICAL EXAM: Pt is alert and oriented, NAD HEENT: normal Neck: JVP - normal, carotids 2+= withBilateral bruits right greater than left Lungs: CTA bilaterally CV: RRR without murmur or gallop Abd: soft, NT, Positive BS, obese Ext: no C/C/E, distal pulses intact and equal Skin: warm/dry no rash  EKG:  Sinus bradycardia 54 beats per minute, within normal limits otherwise.  ASSESSMENT AND PLAN: 1. Carotid artery stenosis, asymptomatic. Meds were reviewed. She should be on aspirin 81 mg daily and we asked her to start this is a routine medication. Also should be on a statin drug. Her LDL was 142 in April. She does not recall any adverse reaction to statins. She thinks she just forgot to refill it last year.  2. Hypertension. Blood pressure control is reasonable on the combination of amlodipine, labetalol, and spironolactone.  3. Hyperlipidemia. Discussion as above. Recommend start atorvastatin 20 mg daily. Repeat lipids and LFTs in 3 months.  4. Tobacco use. Cessation counseling done today.  For followup I will see her back in one year. She will be due for a repeat carotid duplex scan next spring.  Tonny Bollman 11/28/2012 12:45 PM

## 2012-12-03 ENCOUNTER — Ambulatory Visit
Admission: RE | Admit: 2012-12-03 | Discharge: 2012-12-03 | Disposition: A | Discharge status: Home / Self Care | Payer: Medicare Other | Source: Ambulatory Visit | Attending: Internal Medicine | Admitting: Internal Medicine

## 2012-12-03 DIAGNOSIS — Z853 Personal history of malignant neoplasm of breast: Secondary | ICD-10-CM

## 2012-12-03 DIAGNOSIS — Z9889 Other specified postprocedural states: Secondary | ICD-10-CM

## 2012-12-19 ENCOUNTER — Encounter: Payer: Self-pay | Admitting: Internal Medicine

## 2012-12-19 ENCOUNTER — Other Ambulatory Visit: Payer: Self-pay | Admitting: General Practice

## 2012-12-19 MED ORDER — OMEPRAZOLE 40 MG PO CPDR: DELAYED_RELEASE_CAPSULE | ORAL | Status: DC

## 2013-01-01 ENCOUNTER — Other Ambulatory Visit: Payer: Self-pay | Admitting: Cardiovascular Disease

## 2013-01-01 ENCOUNTER — Encounter: Payer: Self-pay | Admitting: Internal Medicine

## 2013-01-01 NOTE — Telephone Encounter (Signed)
Please advise 

## 2013-01-02 ENCOUNTER — Ambulatory Visit (INDEPENDENT_AMBULATORY_CARE_PROVIDER_SITE_OTHER): Payer: Medicare Other | Admitting: Internal Medicine

## 2013-01-02 ENCOUNTER — Encounter: Payer: Self-pay | Admitting: Internal Medicine

## 2013-01-02 VITALS — BP 179/80 | HR 72 | Temp 98.5°F | Wt 227.2 lb

## 2013-01-02 DIAGNOSIS — M25569 Pain in unspecified knee: Secondary | ICD-10-CM

## 2013-01-02 DIAGNOSIS — M79662 Pain in left lower leg: Secondary | ICD-10-CM

## 2013-01-02 DIAGNOSIS — M79609 Pain in unspecified limb: Secondary | ICD-10-CM

## 2013-01-02 DIAGNOSIS — M25562 Pain in left knee: Secondary | ICD-10-CM

## 2013-01-02 LAB — D-DIMER, QUANTITATIVE: D-Dimer, Quant: 0.27 ug/mL-FEU (ref 0.00–0.48)

## 2013-01-02 NOTE — Patient Instructions (Addendum)
Use an anti-inflammatory cream such as Aspercreme or Zostrix cream twice a day to the affected area as needed. In lieu of this warm moist compresses or  hot water bottle can be used. Do not apply ice .  Please monitor My Chart to learn if Xarelto should be started. Celebrex twice a day as needed for pain.

## 2013-01-02 NOTE — Progress Notes (Signed)
  Subjective:    Patient ID: [name], female    DOB: 26-Oct-1945, 67 y.o.   MRN: [id number]  HPI  Symptoms began as pain in the left knee after she struck it against her desk at work  12/29/12.  Since then she has had  sharp discomfort @ knee w/o radiation. She's also had swelling and discomfort in the left calf. In the morning this is most notable; with ambulation it gradually improves.Armond Hang is only been of partial benefit  She has no past history of deep venous thrombosis   Review of Systems  She denies any chest pain, pleuritic discomfort, palpitations, dyspnea, or hemoptysis.     Objective:   Physical Exam Gen.:  well-nourished in appearance. Alert, appropriate and cooperative throughout exam.Appears younger than stated age   Lungs: Normal respiratory effort; chest expands symmetrically. Lungs are clear to auscultation without rales, wheezes, or increased work of breathing. Heart: Normal rate and rhythm. Normal S1 and S2. No gallop, click, or rub. No murmur.                                 Musculoskeletal/extremities:  No clubbing, cyanosis,  or significant extremity  deformity noted. Range of motion normal .Tone & strength  Normal. Trace edema at sock line. There is slight lateral instability of the left knee with flexion. There is blood ability of the left patella. Obvious effusion noted. No tenderness in the popliteal space Joints normal . Nail health good. Able to lie down & sit up w/o help. Negative Homan's bilaterally Vascular:  dorsalis pedis and  posterior tibial pulses are full and equal. No bruits present. Neurologic: Alert and oriented x3.  Gait with slight limp on R .        Skin: Intact without suspicious lesions or rashes.  Psych: Mood and affect are normal. Normally interactive                                                                                        Assessment & Plan:  #1 knee pain , post traumatic; effusion left knee. #2 calf pain;  clinically no evidence of deep venous thrombosis or popliteal cyst See Orders  Orthopedic referral if pain effusion did not respond to the samples of Celebrex , topical anti-inflammatory agents and elevation over the weekend.

## 2013-01-05 ENCOUNTER — Telehealth: Payer: Self-pay | Admitting: *Deleted

## 2013-01-05 NOTE — Telephone Encounter (Signed)
[facility] Triage Call Report Triage Record Num: [id number] Operator: Frederico Hamman Patient Name: [name] Call Date & Time: 01/02/2013 7:45:55PM Patient Phone: [phone] PCP: Marga Melnick Caller Name: [name] Relationship to Patient: Unknown Patient Gender: Female PCP Fax : [phone] Patient DOB: Aug 19, 1945 Practice Name: [facility] Reason for Call: Shanda Bumps from [place] [facility] calling results of Stat lab work. [name] had a STAT D Dimer drawn at 16:54. Ordered by Dr. Alwyn Ren. D Dimer was normal at 0.27 ( 0.00 to 0.48 normal). Was seen in office due to left calf and knee pain and swelling. Hit her left knee at work on 12/29/12. Results called to Dr.Bedsole. Verbal order given to notify patient. [name] notified. [name] states she was given samples of Xarelto by Dr. Alwyn Ren. Dr. Alwyn Ren instructed her not to take meds if her lab work was normal or until she sees results in my chart. States she will not take Xarelto and will monitor MY Chart. Protocol(s) Used: PCP Calls, No Triage (Adult) Recommended Outcome per Protocol: Call Provider within 24 Hours Reason for Outcome: Lab calling with test results Care Advice: ~ 09/

## 2013-02-12 ENCOUNTER — Other Ambulatory Visit: Payer: Self-pay

## 2013-02-26 ENCOUNTER — Other Ambulatory Visit (INDEPENDENT_AMBULATORY_CARE_PROVIDER_SITE_OTHER): Payer: Medicare Other

## 2013-02-26 DIAGNOSIS — I6529 Occlusion and stenosis of unspecified carotid artery: Secondary | ICD-10-CM

## 2013-02-26 DIAGNOSIS — I10 Essential (primary) hypertension: Secondary | ICD-10-CM

## 2013-02-26 DIAGNOSIS — E785 Hyperlipidemia, unspecified: Secondary | ICD-10-CM

## 2013-02-26 LAB — HEPATIC FUNCTION PANEL
ALT: 12 U/L (ref 0–35)
AST: 16 U/L (ref 0–37)
Albumin: 3.9 g/dL (ref 3.5–5.2)
Alkaline Phosphatase: 74 U/L (ref 39–117)
Bilirubin, Direct: 0.3 mg/dL (ref 0.0–0.3)
Total Bilirubin: 0.9 mg/dL (ref 0.3–1.2)
Total Protein: 7.6 g/dL (ref 6.0–8.3)

## 2013-02-26 LAB — LIPID PANEL
LDL Cholesterol: 72 mg/dL (ref 0–99)
VLDL: 20 mg/dL (ref 0.0–40.0)

## 2013-03-19 ENCOUNTER — Encounter: Payer: Self-pay | Admitting: Internal Medicine

## 2013-03-19 ENCOUNTER — Ambulatory Visit (INDEPENDENT_AMBULATORY_CARE_PROVIDER_SITE_OTHER): Payer: Medicare Other | Admitting: Internal Medicine

## 2013-03-19 VITALS — BP 155/84 | HR 68 | Temp 98.0°F | Resp 13 | Wt 226.4 lb

## 2013-03-19 DIAGNOSIS — T783XXA Angioneurotic edema, initial encounter: Secondary | ICD-10-CM

## 2013-03-19 MED ORDER — AMLODIPINE BESYLATE 10 MG PO TABS: ORAL_TABLET | ORAL | Status: DC

## 2013-03-19 MED ORDER — EPINEPHRINE 0.3 MG/0.3ML IJ SOAJ: 0.3000 mg | Freq: Once | INTRAMUSCULAR | Status: DC

## 2013-03-19 NOTE — Progress Notes (Signed)
   Subjective:    Patient ID: [name], female    DOB: 1945/07/20, 67 y.o.   MRN: [id number]  HPI  Last week she had hives and facial swelling with lemon Kool-Aid; this resolved with Benadryl. The facial swelling is described as some induration of the chin. She experienced hives eating peanuts and mixed nuts on 12/5; this resolved with Benadryl.  She ate several shrimp @ approximately 4 PM 03/15/13 and had a similar reaction but also sensation of throat closing. She was treated in the emergency room with parenteral steroids and prescribed oral prednisone and Famotidine.  Significantly she has a past medical history of angioedema with ACE inhibitors.   Review of Systems  She did not have itchy, watery eyes or sneezing with the event or shortness of breath or wheezing.     Objective:   Physical Exam General appearance:good health ;well nourished; no acute distress or increased work of breathing is present.  No  lymphadenopathy about the head, neck, or axilla noted.   Eyes: No conjunctival inflammation or lid edema is present. There is no scleral icterus. Arcus present  Ears:  External ear exam shows no significant lesions or deformities.  Otoscopic examination reveals clear canals, tympanic membranes are intact bilaterally without bulging, retraction, inflammation or discharge.  Nose:  External nasal examination shows no deformity or inflammation. Nasal mucosa are pink and moist without lesions or exudates. No septal dislocation or deviation.No obstruction to airflow.   Oral exam: Dental hygiene is good; lips and gums are healthy appearing.There is no oropharyngeal erythema or exudate noted.   Neck:  No deformities, thyromegaly, masses, or tenderness noted.    Heart:  Normal rate and regular rhythm. S1 and S2 normal without gallop, murmur, click, rub or other extra sounds.   Lungs:Chest clear to auscultation; no wheezes, rhonchi,rales ,or rubs present.No increased work of breathing.      Extremities:  No cyanosis, edema, or clubbing  noted    Skin: Warm & dry w/o hives         Assessment & Plan:  #1 urticaria with lemon Kool-Aid; peanuts/mixed nuts; and shrimp. The Kool-Aid and shrimp ingestion were also associated with some induration of the chin suggesting angioedema. The Kool-Aid reaction may be related to yellow dye.  #2 she was prescribed generic Pepcid in addition to prednisone the emergency room. To stop Prilosec while on Famotidine Plan: Allergy evaluation. She will be instructed in avoiding hypoallergenic foods. EpiPen prescribed

## 2013-03-19 NOTE — Patient Instructions (Addendum)
Restrict hyperallergenic foods at this time: Nuts, strawberries, seafood (shellfish) , chocolate, and tomatoes.Avoid perfumes and cosmetics which are not hypoallergenic.  Because of a history of documented adverse serious drug reaction;Medi Alert bracelet  is recommended. This should include angioedema & hives with  ACE inhibitors, angiotensin receptor blockers, shellfish, yellow dye, and peanuts/mixed nuts. Minimal Blood Pressure Goal= AVERAGE < 140/90;  Ideal is an AVERAGE < 135/85. This AVERAGE should be calculated from @ least 5-7 BP readings taken @ different times of day on different days of week. You should not respond to isolated BP readings , but rather the AVERAGE for that week .Please bring your  blood pressure cuff to office visits to verify that it is reliable.It  can also be checked against the blood pressure device at the pharmacy. Finger or wrist cuffs are not dependable; an arm cuff is.

## 2013-03-19 NOTE — Assessment & Plan Note (Signed)
EpiPen  Allergy referral

## 2013-06-02 ENCOUNTER — Other Ambulatory Visit: Payer: Self-pay | Admitting: Internal Medicine

## 2013-06-02 ENCOUNTER — Encounter: Payer: Self-pay | Admitting: Internal Medicine

## 2013-06-02 DIAGNOSIS — L80 Vitiligo: Secondary | ICD-10-CM

## 2013-09-07 ENCOUNTER — Other Ambulatory Visit (INDEPENDENT_AMBULATORY_CARE_PROVIDER_SITE_OTHER): Payer: Medicare Other

## 2013-09-07 DIAGNOSIS — L80 Vitiligo: Secondary | ICD-10-CM

## 2013-09-07 LAB — TSH: TSH: 1.75 u[IU]/mL (ref 0.35–4.50)

## 2013-09-11 ENCOUNTER — Other Ambulatory Visit: Payer: Self-pay | Admitting: Internal Medicine

## 2013-09-14 ENCOUNTER — Other Ambulatory Visit: Payer: Self-pay

## 2013-09-14 MED ORDER — GABAPENTIN 300 MG PO CAPS: 300.0000 mg | ORAL_CAPSULE | Freq: Three times a day (TID) | ORAL | Status: DC

## 2013-09-14 NOTE — Telephone Encounter (Signed)
OK X1;R X 1 

## 2013-09-14 NOTE — Telephone Encounter (Signed)
Received a refill request from [facility] [phone] for Gabapentin 300 mg take one cap by mouth three times daily #270. I do not see this on present medication list.

## 2013-10-22 NOTE — Telephone Encounter (Signed)
Close Encounter 

## 2013-11-02 ENCOUNTER — Other Ambulatory Visit: Payer: Self-pay

## 2013-11-02 DIAGNOSIS — Z1231 Encounter for screening mammogram for malignant neoplasm of breast: Secondary | ICD-10-CM

## 2013-11-26 ENCOUNTER — Ambulatory Visit: Payer: Medicare Other | Admitting: Physician Assistant

## 2013-11-30 ENCOUNTER — Ambulatory Visit (INDEPENDENT_AMBULATORY_CARE_PROVIDER_SITE_OTHER): Payer: Medicare Other | Admitting: Physician Assistant

## 2013-11-30 ENCOUNTER — Encounter: Payer: Self-pay | Admitting: Physician Assistant

## 2013-11-30 VITALS — BP 150/78 | HR 69 | Ht 64.0 in | Wt 212.0 lb

## 2013-11-30 DIAGNOSIS — F17200 Nicotine dependence, unspecified, uncomplicated: Secondary | ICD-10-CM

## 2013-11-30 DIAGNOSIS — I10 Essential (primary) hypertension: Secondary | ICD-10-CM

## 2013-11-30 DIAGNOSIS — E785 Hyperlipidemia, unspecified: Secondary | ICD-10-CM

## 2013-11-30 DIAGNOSIS — I6529 Occlusion and stenosis of unspecified carotid artery: Secondary | ICD-10-CM

## 2013-11-30 MED ORDER — ATORVASTATIN CALCIUM 20 MG PO TABS: 20.0000 mg | ORAL_TABLET | Freq: Every day | ORAL | Status: DC

## 2013-11-30 MED ORDER — ASPIRIN EC 81 MG PO TBEC: 81.0000 mg | DELAYED_RELEASE_TABLET | Freq: Every day | ORAL | Status: DC

## 2013-11-30 MED ORDER — SPIRONOLACTONE 25 MG PO TABS: 25.0000 mg | ORAL_TABLET | Freq: Two times a day (BID) | ORAL | Status: DC

## 2013-11-30 MED ORDER — LABETALOL HCL 100 MG PO TABS: 100.0000 mg | ORAL_TABLET | Freq: Two times a day (BID) | ORAL | Status: DC

## 2013-11-30 NOTE — Patient Instructions (Signed)
Your physician recommends that you continue on your current medications as directed. Please refer to the Current Medication list given to you today.  Your physician recommends that you return for lab work on 1. BMET on 12/07/13 2. Lipid and Hepatic on 03/01/14  Your physician has requested that you have a carotid duplex. This test is an ultrasound of the carotid arteries in your neck. It looks at blood flow through these arteries that supply the brain with blood. Allow one hour for this exam. There are no restrictions or special instructions.  Your physician wants you to follow-up in: 1 year with Dr Emelda Fear will receive a reminder letter in the mail two months in advance. If you don't receive a letter, please call our office to schedule the follow-up appointment.

## 2013-11-30 NOTE — Progress Notes (Signed)
Cardiology Office Note    Date:  11/30/2013   ID:  Kimberly, [name] 05/07/1945, MRN [id number]  PCP:  Unice Cobble, MD  Cardiologist:  Dr. Sherren Mocha      History of Present Illness: [name] is a 68 y.o. female with a hx of asymptomatic carotid stenosis, HTN, HL.  Last seen in 11/2012.  She returns for FU.  The patient denies chest pain, shortness of breath, syncope, orthopnea, PND or significant pedal edema.  She denies symptoms c/w TIA or AFugax.  She has been out of her Lipitor, Spironolactone, Labetalol for ~ 1 month.    Studies:  - Carotid US (3/14):  RICA 07-86%; LICA 75-44% >> FU 1 year   Recent Labs/Images: 02/26/2013: ALT 12; HDL Cholesterol by NMR 56.80; LDL (calc) 72  09/07/2013: TSH 1.75   Wt Readings from Last 3 Encounters:  03/19/13 226 lb 6.4 oz (102.694 kg)  01/02/13 227 lb 3.2 oz (103.057 kg)  11/28/12 220 lb (99.791 kg)     Past Medical History  Diagnosis Date  . Occlusion and stenosis of carotid artery without mention of cerebral infarction   . HLD (hyperlipidemia)   . Unspecified essential hypertension   . Other abnormal glucose     fasting hyperglycemia  . Angioneurotic edema not elsewhere classified     due to ACE-I  . Personal history of malignant neoplasm of breast   . Absence of menstruation     amenorrhea  . H/O: rheumatic fever     Current Outpatient Prescriptions  Medication Sig Dispense Refill  . amLODipine (NORVASC) 10 MG tablet TAKE ONE TABLET BY MOUTH EVERY DAY  90 tablet  1  . atorvastatin (LIPITOR) 20 MG tablet Take 1 tablet (20 mg total) by mouth daily.  90 tablet  3  . CITRACAL PETITES/VITAMIN D 200-250 MG-UNIT TABS TAKE TWO TO THREE TABLETS BY MOUTH TWICE DAILY  600 each  0  . EPINEPHrine (EPI-PEN) 0.3 mg/0.3 mL SOAJ injection Inject 0.3 mLs (0.3 mg total) into the muscle once.  1 Device  1  . famotidine (PEPCID) 20 MG tablet Take 20 mg by mouth 2 (two) times daily.      Marland Kitchen gabapentin (NEURONTIN) 300 MG capsule Take 1  capsule (300 mg total) by mouth 3 (three) times daily.  270 capsule  1  . labetalol (NORMODYNE) 100 MG tablet TAKE ONE TABLET BY MOUTH TWICE DAILY  180 tablet  3  . omeprazole (PRILOSEC) 40 MG capsule TAKE ONE CAPSULE BY MOUTH ONCE DAILY  90 capsule  3  . predniSONE (DELTASONE) 20 MG tablet Take 20 mg by mouth 2 (two) times daily.      Marland Kitchen spironolactone (ALDACTONE) 25 MG tablet TAKE ONE TABLET BY MOUTH TWICE DAILY  60 tablet  11   No current facility-administered medications for this visit.     Allergies:   Benazepril hcl; Peanut-containing drug products; Shellfish allergy; and Yellow dyes (non-tartrazine)   Social History:  The patient  reports that she has been smoking.  She does not have any smokeless tobacco history on file. She reports that she drinks alcohol. She reports that she does not use illicit drugs.   Family History:  The patient's family history includes Breast cancer in her sister; Hypertension in an other family member. There is no history of Heart attack, Stroke, Diabetes, or Heart disease.   ROS:  Please see the history of present illness.      All other systems reviewed and negative.  PHYSICAL EXAM: VS:  BP 150/78  Pulse 69  Ht 5\' 4"  (1.626 m)  Wt 212 lb (96.163 kg)  BMI 36.37 kg/m2 Well nourished, well developed, in no acute distress HEENT: normal Neck: no JVD Cardiac:  normal S1, S2; RRR; no murmur Lungs:  clear to auscultation bilaterally, no wheezing, rhonchi or rales Abd: soft, nontender, no hepatomegaly Ext: no edema Skin: warm and dry Neuro:  CNs 2-12 intact, no focal abnormalities noted  EKG:  NSR, HR 69, normal axis, PRWP, NSSTTW changes, no change from prior tracing      ASSESSMENT AND PLAN:  CAROTID ARTERY STENOSIS, BILATERAL:  Asymptomatic.  She is past due for carotid dopplers.  I will arrange Carotid US.  Continue ASA.  Resume statin.   Unspecified essential hypertension:  BP elevated.  Will continue Amlodipine.  Resume Labetalol,  Spironolactone.  Check BMET in 1 week.  HYPERLIPIDEMIA:  Resume Lipitor 20 QD.  Check Lipids and LFTs in 3 mos.   CIGARETTE SMOKER:  We discussed the importance of stopping smoking.  Disposition:  FU with Dr. Sherren Mocha in 1 year.    Signed, Versie Starks, MHS 11/30/2013 1:43 PM    [facility] [address], [place], [place]  [address] Phone: [phone]; Fax: [phone]

## 2013-12-04 ENCOUNTER — Ambulatory Visit
Admission: RE | Admit: 2013-12-04 | Discharge: 2013-12-04 | Disposition: A | Discharge status: Home / Self Care | Payer: Medicare Other | Source: Ambulatory Visit

## 2013-12-04 DIAGNOSIS — Z1231 Encounter for screening mammogram for malignant neoplasm of breast: Secondary | ICD-10-CM

## 2013-12-07 ENCOUNTER — Other Ambulatory Visit (INDEPENDENT_AMBULATORY_CARE_PROVIDER_SITE_OTHER): Payer: Medicare Other

## 2013-12-07 ENCOUNTER — Ambulatory Visit (HOSPITAL_COMMUNITY): Payer: Medicare Other | Attending: Physician Assistant | Admitting: Radiology

## 2013-12-07 DIAGNOSIS — I6529 Occlusion and stenosis of unspecified carotid artery: Secondary | ICD-10-CM | POA: Insufficient documentation

## 2013-12-07 DIAGNOSIS — F17200 Nicotine dependence, unspecified, uncomplicated: Secondary | ICD-10-CM | POA: Diagnosis not present

## 2013-12-07 DIAGNOSIS — I10 Essential (primary) hypertension: Secondary | ICD-10-CM | POA: Insufficient documentation

## 2013-12-07 DIAGNOSIS — E785 Hyperlipidemia, unspecified: Secondary | ICD-10-CM | POA: Diagnosis not present

## 2013-12-07 LAB — BASIC METABOLIC PANEL
BUN: 12 mg/dL (ref 6–23)
CHLORIDE: 103 meq/L (ref 96–112)
CO2: 24 meq/L (ref 19–32)
Calcium: 9.4 mg/dL (ref 8.4–10.5)
Creatinine, Ser: 0.9 mg/dL (ref 0.4–1.2)
GFR: 81.48 mL/min (ref >60.00)
Glucose, Bld: 100 mg/dL — ABNORMAL HIGH (ref 70–99)
Potassium: 4.3 mEq/L (ref 3.5–5.1)
Sodium: 139 mEq/L (ref 135–145)

## 2013-12-07 NOTE — Progress Notes (Signed)
Carotid Duplex performed. 

## 2013-12-08 ENCOUNTER — Encounter: Payer: Self-pay | Admitting: Physician Assistant

## 2013-12-08 ENCOUNTER — Telehealth: Payer: Self-pay | Admitting: *Deleted

## 2013-12-08 NOTE — Telephone Encounter (Signed)
pt notified about lab results with verbal understanding  

## 2013-12-09 ENCOUNTER — Telehealth: Payer: Self-pay | Admitting: *Deleted

## 2013-12-09 NOTE — Telephone Encounter (Signed)
pt notified about carotid u/s results with verbal understanding

## 2013-12-29 ENCOUNTER — Other Ambulatory Visit: Payer: Self-pay

## 2013-12-29 MED ORDER — AMLODIPINE BESYLATE 10 MG PO TABS: ORAL_TABLET | ORAL | Status: DC

## 2014-01-11 ENCOUNTER — Encounter: Payer: Medicare Other | Admitting: Internal Medicine

## 2014-01-25 ENCOUNTER — Encounter: Payer: Medicare Other | Admitting: Internal Medicine

## 2014-02-11 ENCOUNTER — Ambulatory Visit (INDEPENDENT_AMBULATORY_CARE_PROVIDER_SITE_OTHER): Payer: Medicare Other | Admitting: Internal Medicine

## 2014-02-11 ENCOUNTER — Other Ambulatory Visit: Payer: Medicare Other

## 2014-02-11 ENCOUNTER — Other Ambulatory Visit (INDEPENDENT_AMBULATORY_CARE_PROVIDER_SITE_OTHER): Payer: Medicare Other

## 2014-02-11 ENCOUNTER — Encounter: Payer: Self-pay | Admitting: Internal Medicine

## 2014-02-11 VITALS — BP 180/96 | HR 73 | Temp 98.7°F | Resp 13 | Ht 64.0 in | Wt 212.0 lb

## 2014-02-11 DIAGNOSIS — I10 Essential (primary) hypertension: Secondary | ICD-10-CM

## 2014-02-11 DIAGNOSIS — I6529 Occlusion and stenosis of unspecified carotid artery: Secondary | ICD-10-CM

## 2014-02-11 DIAGNOSIS — E785 Hyperlipidemia, unspecified: Secondary | ICD-10-CM

## 2014-02-11 DIAGNOSIS — R739 Hyperglycemia, unspecified: Secondary | ICD-10-CM | POA: Diagnosis not present

## 2014-02-11 DIAGNOSIS — F17200 Nicotine dependence, unspecified, uncomplicated: Secondary | ICD-10-CM

## 2014-02-11 DIAGNOSIS — Z720 Tobacco use: Secondary | ICD-10-CM

## 2014-02-11 LAB — BASIC METABOLIC PANEL
BUN: 15 mg/dL (ref 6–23)
CHLORIDE: 105 meq/L (ref 96–112)
CO2: 23 meq/L (ref 19–32)
CREATININE: 0.9 mg/dL (ref 0.4–1.2)
Calcium: 9.1 mg/dL (ref 8.4–10.5)
GFR: 85.88 mL/min (ref >60.00)
GLUCOSE: 94 mg/dL (ref 70–99)
Potassium: 3.7 mEq/L (ref 3.5–5.1)
Sodium: 140 mEq/L (ref 135–145)

## 2014-02-11 LAB — LIPID PANEL
CHOL/HDL RATIO: 5
CHOLESTEROL: 216 mg/dL — AB (ref 0–200)
HDL: 41.6 mg/dL (ref >39.00)
LDL Cholesterol: 152 mg/dL — ABNORMAL HIGH (ref 0–99)
NonHDL: 174.4
Triglycerides: 114 mg/dL (ref 0.0–149.0)
VLDL: 22.8 mg/dL (ref 0.0–40.0)

## 2014-02-11 LAB — HEPATIC FUNCTION PANEL
ALT: 8 U/L (ref 0–35)
AST: 12 U/L (ref 0–37)
Albumin: 3.5 g/dL (ref 3.5–5.2)
Alkaline Phosphatase: 77 U/L (ref 39–117)
Bilirubin, Direct: 0.1 mg/dL (ref 0.0–0.3)
TOTAL PROTEIN: 7.7 g/dL (ref 6.0–8.3)
Total Bilirubin: 0.8 mg/dL (ref 0.2–1.2)

## 2014-02-11 LAB — TSH: TSH: 2.25 u[IU]/mL (ref 0.35–4.50)

## 2014-02-11 LAB — HEMOGLOBIN A1C: Hgb A1c MFr Bld: 5.7 % (ref 4.6–6.5)

## 2014-02-11 NOTE — Assessment & Plan Note (Addendum)
OFF meds since mid Oct 2015 Lipids, LFTs, TSH ,CK

## 2014-02-11 NOTE — Assessment & Plan Note (Signed)
A1c

## 2014-02-11 NOTE — Progress Notes (Signed)
Subjective:    Patient ID: [name], female    DOB: 1945-12-20, 68 y.o.   MRN: [id number]  HPI She is here to assess active health issues & conditions. PMH, FH, & Social history verified & updated   She is not on a heart healthy diet specifically but she does ingest increased amounts of fruits.  She continues to smoke roughly half pack a day.  She does not have a  regular exercise program but does climb stairs repeatedly and has no cardiac symptoms  She is not monitoring her blood pressure at home. In fact she's been off all medications for a month due to issues with her mail order supplier. Apparently she was not receiving bills for the medicines and the supplier interrupted service in approximately mid-October.Prescriptions are now ready for pick up  @ her pharmacy.  She has received a notice that her colonoscopy is due. She will schedule that. She has no active GI symptoms      Review of Systems   Chest pain, palpitations, tachycardia, exertional dyspnea, paroxysmal nocturnal dyspnea, claudication or edema are absent.  Unexplained weight loss, abdominal pain, significant dyspepsia, dysphagia, melena, rectal bleeding, or persistently small caliber stools are denied.       Objective:   Physical Exam  Gen.: Healthy and well-nourished in appearance. Alert, appropriate and cooperative throughout exam. Appears younger than stated age  Head: Normocephalic without obvious abnormalities  Eyes: Arcus senilis.No corneal or conjunctival inflammation noted. Pupils equal round reactive to light and accommodation. Extraocular motion intact.  Ears: External  ear exam reveals no significant lesions or deformities. Canals clear .TMs normal. Hearing is grossly normal bilaterally. Nose: External nasal exam reveals no deformity or inflammation. Nasal mucosa are pink and moist. No lesions or exudates noted.   Mouth: Oral mucosa and oropharynx reveal no lesions or exudates. Teeth in good  repair. Neck: No deformities, masses, or tenderness noted. Range of motion & Thyroid normal Lungs: Normal respiratory effort; chest expands symmetrically. Lungs are clear to auscultation without rales, wheezes, or increased work of breathing. Heart: Normal rate and rhythm. Normal S1 and S2. No gallop, click, or rub. No murmur. Abdomen: Bowel sounds normal; abdomen soft and nontender. No masses, organomegaly or hernias noted. Genitalia: as per Gyn                                  Musculoskeletal/extremities: No deformity or scoliosis noted of  the thoracic or lumbar spine.  No clubbing, cyanosis, edema, or significant extremity  deformity noted. Range of motion normal .Tone & strength normal. Hand joints normal  Fingernail / toenail health good. Valgus knee changes. Instability L knee with ROM . Able to lie down & sit up w/o help. Negative SLR bilaterally Vascular: Carotid, radial artery, dorsalis pedis and  posterior tibial pulses are full and equal. No bruits present. Neurologic: Alert and oriented x3. Deep tendon reflexes symmetrical and normal.  Gait normal.    Skin: Intact without suspicious lesions or rashes. Lymph: No cervical, axillary lymphadenopathy present. Psych: Mood and affect are normal. Normally interactive  Assessment & Plan:  See Current Assessment & Plan in Problem List under specific Diagnosis

## 2014-02-11 NOTE — Patient Instructions (Addendum)
Your next office appointment will be determined based upon review of your pending labs . Those instructions will be transmitted to you through My Chart  Please think about quitting smoking. Review the risks we discussed. Please call 1-800-QUIT-NOW 517-605-1128) for free smoking cessation counseling. There are multiple options are to help you stop smoking. These include nicotine patches or nicotine gum.  RESTART your medications ASAP. Minimal Blood Pressure Goal= AVERAGE < 140/90;  Ideal is an AVERAGE < 135/85. This AVERAGE should be calculated from @ least 5-7 BP readings taken @ different times of day on different days of week. You should not respond to isolated BP readings , but rather the AVERAGE for that week .Please bring your  blood pressure cuff to office visits to verify that it is reliable.It  can also be checked against the blood pressure device at the pharmacy. Finger or wrist cuffs are not dependable; an arm cuff is.

## 2014-02-11 NOTE — Progress Notes (Signed)
Pre visit review using our clinic review tool, if applicable. No additional management support is needed unless otherwise documented below in the visit note. 

## 2014-02-11 NOTE — Assessment & Plan Note (Signed)
OFF meds since mid Oct 2015 due to problems with mail order supplier (she was not getting bills & supply interrupted). To start meds 02/11/14 BMET BP goals discussed

## 2014-02-12 ENCOUNTER — Telehealth: Payer: Self-pay | Admitting: Internal Medicine

## 2014-02-12 NOTE — Telephone Encounter (Signed)
emmi emailed °

## 2014-02-15 ENCOUNTER — Other Ambulatory Visit: Payer: Self-pay | Admitting: Internal Medicine

## 2014-02-15 DIAGNOSIS — E785 Hyperlipidemia, unspecified: Secondary | ICD-10-CM

## 2014-03-01 ENCOUNTER — Other Ambulatory Visit: Payer: Self-pay

## 2014-10-14 ENCOUNTER — Other Ambulatory Visit: Payer: Self-pay | Admitting: Internal Medicine

## 2014-10-29 ENCOUNTER — Other Ambulatory Visit: Payer: Self-pay

## 2014-10-29 DIAGNOSIS — Z1231 Encounter for screening mammogram for malignant neoplasm of breast: Secondary | ICD-10-CM

## 2014-11-18 ENCOUNTER — Ambulatory Visit (INDEPENDENT_AMBULATORY_CARE_PROVIDER_SITE_OTHER)
Admission: RE | Admit: 2014-11-18 | Discharge: 2014-11-18 | Disposition: A | Discharge status: Home / Self Care | Payer: Medicare Other | Source: Ambulatory Visit | Attending: Internal Medicine | Admitting: Internal Medicine

## 2014-11-18 ENCOUNTER — Ambulatory Visit (INDEPENDENT_AMBULATORY_CARE_PROVIDER_SITE_OTHER): Payer: Medicare Other | Admitting: Internal Medicine

## 2014-11-18 ENCOUNTER — Encounter: Payer: Self-pay | Admitting: Internal Medicine

## 2014-11-18 VITALS — BP 142/84 | HR 67 | Temp 98.2°F | Resp 16 | Wt 212.0 lb

## 2014-11-18 DIAGNOSIS — S99922A Unspecified injury of left foot, initial encounter: Secondary | ICD-10-CM | POA: Diagnosis not present

## 2014-11-18 DIAGNOSIS — M79672 Pain in left foot: Secondary | ICD-10-CM | POA: Diagnosis not present

## 2014-11-18 MED ORDER — TRAMADOL HCL 50 MG PO TABS: 50.0000 mg | ORAL_TABLET | Freq: Three times a day (TID) | ORAL | Status: DC | PRN

## 2014-11-18 NOTE — Patient Instructions (Signed)
A wooden sandal will prevent excessive mobilization of the foot and help decrease pain. Buddy tape the involved toes to the non involved 3rd toe if having pain as discussed.

## 2014-11-18 NOTE — Progress Notes (Signed)
Pre visit review using our clinic review tool, if applicable. No additional management support is needed unless otherwise documented below in the visit note. 

## 2014-11-18 NOTE — Progress Notes (Signed)
   Subjective:    Patient ID: [name], female    DOB: 01/08/1946, 69 y.o.   MRN: [id number]  HPI She struck her left foot against the chair leg 11/13/14. There was bruising as well as swelling. She's had persistent pain in the fourth and fifth left toes as well as the dorsum of the foot at the base of the toes. The pain is constant and sharp. Advil has been of minimal benefit.  Review of Systems  She denies  numbness, tingling, or weakness in the extremity.     Objective:   Physical Exam  The great toenail on the left foot is thickened. The posterior tibial pulse is slightly decreased. The second toe slightly overlaps the third. There is tenderness with flexion or palpation of the fourth and fifth toes. There is also tenderness to palpation at the base the toes and dorsum of the foot. No ischemic changes are present.      Assessment & Plan:  #1 foot pain, post trauma  Plan see orders

## 2014-11-29 ENCOUNTER — Other Ambulatory Visit (HOSPITAL_COMMUNITY): Payer: Self-pay | Admitting: Cardiovascular Disease

## 2014-11-29 DIAGNOSIS — I6523 Occlusion and stenosis of bilateral carotid arteries: Secondary | ICD-10-CM

## 2014-12-03 ENCOUNTER — Ambulatory Visit (HOSPITAL_COMMUNITY)
Admission: RE | Admit: 2014-12-03 | Discharge: 2014-12-03 | Disposition: A | Discharge status: Home / Self Care | Payer: Medicare Other | Source: Ambulatory Visit | Attending: Cardiology | Admitting: Cardiology

## 2014-12-03 DIAGNOSIS — I6523 Occlusion and stenosis of bilateral carotid arteries: Secondary | ICD-10-CM | POA: Diagnosis not present

## 2014-12-06 ENCOUNTER — Telehealth: Payer: Self-pay | Admitting: Cardiovascular Disease

## 2014-12-06 NOTE — Telephone Encounter (Signed)
F/u ° ° ° °Pt returning Lauren's phone call. °

## 2014-12-06 NOTE — Telephone Encounter (Signed)
Left message on machine for pt to contact the office.   

## 2014-12-07 ENCOUNTER — Encounter: Payer: Self-pay | Admitting: Cardiovascular Disease

## 2014-12-07 ENCOUNTER — Ambulatory Visit (INDEPENDENT_AMBULATORY_CARE_PROVIDER_SITE_OTHER): Payer: Medicare Other | Admitting: Cardiovascular Disease

## 2014-12-07 ENCOUNTER — Ambulatory Visit
Admission: RE | Admit: 2014-12-07 | Discharge: 2014-12-07 | Disposition: A | Discharge status: Home / Self Care | Payer: Medicare Other | Source: Ambulatory Visit

## 2014-12-07 VITALS — BP 130/80 | HR 60 | Wt 212.0 lb

## 2014-12-07 DIAGNOSIS — I10 Essential (primary) hypertension: Secondary | ICD-10-CM | POA: Diagnosis not present

## 2014-12-07 DIAGNOSIS — E785 Hyperlipidemia, unspecified: Secondary | ICD-10-CM

## 2014-12-07 DIAGNOSIS — Z1231 Encounter for screening mammogram for malignant neoplasm of breast: Secondary | ICD-10-CM

## 2014-12-07 NOTE — Progress Notes (Signed)
Cardiology Office Note Date:  12/07/2014   ID:  Vanissa, Strength 11/20/1945, MRN [id number]  PCP:  Unice Cobble, MD  Cardiologist:  Sherren Mocha, MD    Chief Complaint  Patient presents with  . Follow-up   History of Present Illness: [name] is a 69 y.o. female who presents for follow-up evaluation.    the patient has been followed for asymptomatic carotid stenosis, hypertension, and hyperlipidemia. She continues to smoke cigarettes, about one half pack per day. She has had no chest pain, shortness of breath, edema, orthopnea, PND, or heart palpitations. She's had no stroke or TIA symptoms. When she was seen last year, she had stopped taking many of her medicines. She now is back on all of her maintenance medications including her antihypertensives and her statin drug.  Past Medical History  Diagnosis Date  . Occlusion and stenosis of carotid artery without mention of cerebral infarction   . HLD (hyperlipidemia)   . Unspecified essential hypertension   . Other abnormal glucose     fasting hyperglycemia  . Angioneurotic edema not elsewhere classified     due to ACE-I  . Personal history of malignant neoplasm of breast   . Absence of menstruation     amenorrhea  . H/O: rheumatic fever   . Carotid stenosis     Carotid US (8/15):  RICA 03-49%; LICA 17-91% >>> FU 1 year    Past Surgical History  Procedure Laterality Date  . Appendectomy    . Colonoscopy  2005    negative; Dr Collene Mares  . Breast lumpectomy    . G0 p0      Dr. Maudry Diego    Current Outpatient Prescriptions  Medication Sig Dispense Refill  . amLODipine (NORVASC) 10 MG tablet Take 1 tablet (10 mg total) by mouth daily. --patient needs office visit before any further refills 90 tablet 0  . aspirin EC 81 MG tablet Take 1 tablet (81 mg total) by mouth daily.    Marland Kitchen atorvastatin (LIPITOR) 20 MG tablet Take 1 tablet (20 mg total) by mouth daily. 90 tablet 3  . labetalol (NORMODYNE) 100 MG tablet Take 1 tablet  (100 mg total) by mouth 2 (two) times daily. 180 tablet 3  . omeprazole (PRILOSEC) 40 MG capsule TAKE ONE CAPSULE BY MOUTH ONCE DAILY 90 capsule 3  . spironolactone (ALDACTONE) 25 MG tablet Take 1 tablet (25 mg total) by mouth 2 (two) times daily. 180 tablet 3  . traMADol (ULTRAM) 50 MG tablet Take 1 tablet (50 mg total) by mouth every 8 (eight) hours as needed. 30 tablet 0   No current facility-administered medications for this visit.    Allergies:   Benazepril hcl; Peanut-containing drug products; Shellfish allergy; and Yellow dyes (non-tartrazine)   Social History:  The patient  reports that she has been smoking.  She does not have any smokeless tobacco history on file. She reports that she drinks alcohol. She reports that she does not use illicit drugs.   Family History:  The patient's family history includes Breast cancer in her sister; Hypertension in an other family member. There is no history of Heart attack, Stroke, Diabetes, or Heart disease.    ROS:  Please see the history of present illness.  All other systems are reviewed and negative.    PHYSICAL EXAM: VS:  BP 130/80 mmHg  Pulse 60  Wt 212 lb (96.163 kg) , BMI Body mass index is 36.37 kg/(m^2). GEN: Well nourished, well developed, in no acute distress  HEENT: normal Neck: no JVD, no masses.  There are bilateral carotid bruits Cardiac: RRR without murmur or gallop                Respiratory:  clear to auscultation bilaterally, normal work of breathing GI: soft, nontender, nondistended, + BS MS: no deformity or atrophy Ext: no pretibial edema, pedal pulses 2+= bilaterally Skin: warm and dry, no rash Neuro:  Strength and sensation are intact Psych: euthymic mood, full affect  EKG:  EKG is ordered today. The ekg ordered today shows  Sinus rhythm with sinus arrhythmia heart rate 61 bpm. No significant ST-T wave changes  Recent Labs: 02/11/2014: ALT 8; BUN 15; Creatinine, Ser 0.9; Potassium 3.7; Sodium 140; TSH 2.25    Lipid Panel     Component Value Date/Time   CHOL 216* 02/11/2014 1539   TRIG 114.0 02/11/2014 1539   HDL 41.60 02/11/2014 1539   CHOLHDL 5 02/11/2014 1539   VLDL 22.8 02/11/2014 1539   LDLCALC 152* 02/11/2014 1539   LDLDIRECT 141.8 07/31/2012 1537      Wt Readings from Last 3 Encounters:  12/07/14 212 lb (96.163 kg)  11/18/14 212 lb (96.163 kg)  02/11/14 212 lb (96.163 kg)    Cardiac Studies Reviewed:  carotid duplex scan 12/03/2014: there is heterogenous plaque in both carotid bifurcations with 40-59% ICA stenoses bilaterally , stable from previous studies.  ASSESSMENT AND PLAN: 1.   Carotid stenosis, asymptomatic: the importance of smoking cessation was reviewed. She is on anti-platelet therapy with aspirin. Her risk reduction program includes a statin drug. Will repeat a duplex scan next year prior to her office visit.  2. Essential hypertension: Blood pressure is controlled on a combination of amlodipine, labetalol, and Aldactone. Will update a metabolic panel.  3. Hyperlipidemia: continues on atorvastatin.   4. Tobacco: cessation advised. She understands the importance of this.   Current medicines are reviewed with the patient today.  The patient does not have concerns regarding medicines.  Labs/ tests ordered today include:  No orders of the defined types were placed in this encounter.    Disposition:   FU one year  Signed, Sherren Mocha, MD  12/07/2014 4:52 PM    [facility] [place], [place], [place]  [address] Phone: [phone]; Fax: [phone]

## 2014-12-07 NOTE — Patient Instructions (Signed)
Medication Instructions:  Your physician recommends that you continue on your current medications as directed. Please refer to the Current Medication list given to you today.  Labwork: Your physician recommends that you return for a FASTING LIPID, LIVER and BMP--nothing to eat or drink after midnight, lab opens at 7:30 AM  Testing/Procedures: Your physician has requested that you have a carotid duplex in 1 YEAR. This test is an ultrasound of the carotid arteries in your neck. It looks at blood flow through these arteries that supply the brain with blood. Allow one hour for this exam. There are no restrictions or special instructions.  Follow-Up: Your physician wants you to follow-up in: 1 YEAR with Dr Burt Knack.  You will receive a reminder letter in the mail two months in advance. If you don't receive a letter, please call our office to schedule the follow-up appointment.   Any Other Special Instructions Will Be Listed Below (If Applicable).

## 2014-12-07 NOTE — Telephone Encounter (Signed)
I spoke with the pt and made her aware of carotid result. The pt would like to see Dr Burt Knack today for [place]. Appointment scheduled.

## 2014-12-08 NOTE — Addendum Note (Signed)
Addended by: Thompson Grayer on: 12/08/2014 05:55 PM   Modules accepted: Orders

## 2014-12-09 ENCOUNTER — Other Ambulatory Visit: Payer: Medicare Other

## 2014-12-09 ENCOUNTER — Other Ambulatory Visit: Payer: Self-pay | Admitting: Obstetrics and Gynecology

## 2014-12-09 DIAGNOSIS — R928 Other abnormal and inconclusive findings on diagnostic imaging of breast: Secondary | ICD-10-CM

## 2014-12-10 ENCOUNTER — Other Ambulatory Visit: Payer: Medicare Other

## 2014-12-20 ENCOUNTER — Ambulatory Visit
Admission: RE | Admit: 2014-12-20 | Discharge: 2014-12-20 | Disposition: A | Discharge status: Home / Self Care | Payer: Medicare Other | Source: Ambulatory Visit | Attending: Obstetrics and Gynecology | Admitting: Obstetrics and Gynecology

## 2014-12-20 ENCOUNTER — Other Ambulatory Visit (INDEPENDENT_AMBULATORY_CARE_PROVIDER_SITE_OTHER): Payer: Medicare Other | Admitting: *Deleted

## 2014-12-20 DIAGNOSIS — E785 Hyperlipidemia, unspecified: Secondary | ICD-10-CM | POA: Diagnosis not present

## 2014-12-20 DIAGNOSIS — R928 Other abnormal and inconclusive findings on diagnostic imaging of breast: Secondary | ICD-10-CM

## 2014-12-20 DIAGNOSIS — I10 Essential (primary) hypertension: Secondary | ICD-10-CM

## 2014-12-20 LAB — HEPATIC FUNCTION PANEL
ALT: 8 U/L (ref 0–35)
AST: 11 U/L (ref 0–37)
Albumin: 4.1 g/dL (ref 3.5–5.2)
Alkaline Phosphatase: 85 U/L (ref 39–117)
BILIRUBIN DIRECT: 0.1 mg/dL (ref 0.0–0.3)
BILIRUBIN TOTAL: 0.5 mg/dL (ref 0.2–1.2)
TOTAL PROTEIN: 7.6 g/dL (ref 6.0–8.3)

## 2014-12-20 LAB — BASIC METABOLIC PANEL
BUN: 12 mg/dL (ref 6–23)
CHLORIDE: 104 meq/L (ref 96–112)
CO2: 24 mEq/L (ref 19–32)
Calcium: 9.4 mg/dL (ref 8.4–10.5)
Creatinine, Ser: 0.76 mg/dL (ref 0.40–1.20)
GFR: 97.46 mL/min (ref >60.00)
Glucose, Bld: 91 mg/dL (ref 70–99)
POTASSIUM: 3.8 meq/L (ref 3.5–5.1)
SODIUM: 138 meq/L (ref 135–145)

## 2014-12-20 LAB — LIPID PANEL
CHOL/HDL RATIO: 3
Cholesterol: 137 mg/dL (ref 0–200)
HDL: 43 mg/dL (ref >39.00)
LDL CALC: 69 mg/dL (ref 0–99)
NonHDL: 94.18
TRIGLYCERIDES: 127 mg/dL (ref 0.0–149.0)
VLDL: 25.4 mg/dL (ref 0.0–40.0)

## 2014-12-20 NOTE — Addendum Note (Signed)
Addended by: Eulis Foster on: 12/20/2014 10:58 AM   Modules accepted: Orders

## 2014-12-28 ENCOUNTER — Other Ambulatory Visit: Payer: Self-pay | Admitting: Obstetrics and Gynecology

## 2014-12-28 DIAGNOSIS — R921 Mammographic calcification found on diagnostic imaging of breast: Secondary | ICD-10-CM

## 2015-01-12 ENCOUNTER — Inpatient Hospital Stay: Admission: RE | Admit: 2015-01-12 | Payer: Medicare Other | Source: Ambulatory Visit

## 2015-01-14 ENCOUNTER — Telehealth: Payer: Self-pay | Admitting: Diagnostic Radiology

## 2015-01-14 NOTE — Telephone Encounter (Signed)
I spoke with [name] today. She had some questions about whether to proceed with breast biopsy or 6 month follow-up.  I have reviewed her images from recent diagnostic mammogram and concur that calcifications are likely benign, likely related to fat necrosis.  We discussed the option of biopsy for a definitive answer or close follow-up.  She chooses close follow-up at this time. I told her that a biopsy can be performed if she changes her mind.  Otherwise we will see her in 6 months.

## 2015-02-02 ENCOUNTER — Other Ambulatory Visit: Payer: Self-pay | Admitting: Internal Medicine

## 2015-03-09 ENCOUNTER — Other Ambulatory Visit (INDEPENDENT_AMBULATORY_CARE_PROVIDER_SITE_OTHER): Payer: Medicare Other

## 2015-03-09 ENCOUNTER — Ambulatory Visit (INDEPENDENT_AMBULATORY_CARE_PROVIDER_SITE_OTHER): Payer: Medicare Other | Admitting: Internal Medicine

## 2015-03-09 ENCOUNTER — Encounter: Payer: Self-pay | Admitting: Internal Medicine

## 2015-03-09 VITALS — BP 188/80 | HR 71 | Temp 98.6°F | Resp 12 | Ht 64.0 in | Wt 214.0 lb

## 2015-03-09 DIAGNOSIS — E785 Hyperlipidemia, unspecified: Secondary | ICD-10-CM

## 2015-03-09 DIAGNOSIS — I10 Essential (primary) hypertension: Secondary | ICD-10-CM

## 2015-03-09 DIAGNOSIS — F17200 Nicotine dependence, unspecified, uncomplicated: Secondary | ICD-10-CM | POA: Diagnosis not present

## 2015-03-09 LAB — TSH: TSH: 1.43 u[IU]/mL (ref 0.35–4.50)

## 2015-03-09 MED ORDER — LABETALOL HCL 200 MG PO TABS: ORAL_TABLET | ORAL | Status: DC

## 2015-03-09 NOTE — Assessment & Plan Note (Addendum)
Lipids reviewed TSH ordered

## 2015-03-09 NOTE — Progress Notes (Signed)
Pre visit review using our clinic review tool, if applicable. No additional management support is needed unless otherwise documented below in the visit note. 

## 2015-03-09 NOTE — Assessment & Plan Note (Signed)
Risks discussed and options reviewed

## 2015-03-09 NOTE — Assessment & Plan Note (Addendum)
Blood pressure goals reviewed; risk 8 times normal with present systolic blood pressure discussed. Labetalol will be increased to 200 mg twice a day. She'll be asked to follow-up in 4-6 weeks. BMET reviewed

## 2015-03-09 NOTE — Patient Instructions (Addendum)
Minimal Blood Pressure Goal= AVERAGE < 140/90;  Ideal is an AVERAGE < 135/85. This AVERAGE should be calculated from @ least 5-7 BP readings taken @ different times of day on different days of week. You should not respond to isolated BP readings , but rather the AVERAGE for that week .Please bring your  blood pressure cuff to office visits to verify that it is reliable.It  can also be checked against the blood pressure device at the pharmacy. Finger or wrist cuffs are not dependable; an arm cuff is.  Please think about quitting smoking. Review the risks we discussed. Please call 1-800-QUIT-NOW [phone]) for free smoking cessation counseling. There are multiple options are to help you stop smoking. These include nicotine patches, nicotine gum, and the new "E cigarette".  As per the Standard of Care , screening Colonoscopy recommended @ 50 & every 5-10 years thereafter . More frequent monitor would be dictated by family history or findings @ Colonoscopy. Check with Dr Lorie Apley office as to when to repeat this.

## 2015-03-09 NOTE — Progress Notes (Signed)
   Subjective:    Patient ID: [name], female    DOB: [birth year], 69 y.o.   MRN: [id number]  HPI The patient is here to assess status of active health conditions.  PMH, FH, & Social History reviewed & updated.No change in [place] as recorded.  She is not on a heart healthy diet as she eats fried foods, red meat, and salt without restriction. She has no regular exercise program but climbs 21 steps 5-6 times per day without associated symptoms. She rarely checks her blood pressure and has no average.  She continues to smoke 10 cigarettes a day. She rarely has alcohol.  Colonoscopy has been performed twice and was negative both times. Most recently this was done by Dr.Mann. There is no family history colon cancer. She has no GI symptoms.  LDL 69 ; HDL 43; liver function tests; chemistries, and renal function all normal 12/20/14.  Review of Systems  Chest pain, palpitations, tachycardia, exertional dyspnea, paroxysmal nocturnal dyspnea, claudication or edema are absent. No unexplained weight loss, abdominal pain, significant dyspepsia, dysphagia, melena, rectal bleeding, or persistently small caliber stools. Dysuria, pyuria, hematuria, frequency, nocturia or polyuria are denied. Change in hair, skin, nails denied. No bowel changes of constipation or diarrhea. No intolerance to heat. She is intolerant to cold..     Objective:   Physical Exam Pertinent or positive findings include: Arcus senilis present. She has significant caries in the left posterior mandibular molar. She has mild crepitus of the knees.  General appearance :adequately nourished; in no distress.  Eyes: No conjunctival inflammation or scleral icterus is present.  Oral exam:  Lips and gums are healthy appearing.There is no oropharyngeal erythema or exudate noted.   Heart:  Normal rate and regular rhythm. S1 and S2 normal without gallop, murmur, click, rub or other extra sounds    Lungs:Chest clear to auscultation; no  wheezes, rhonchi,rales ,or rubs present.No increased work of breathing.   Abdomen: bowel sounds normal, soft and non-tender without masses, organomegaly or hernias noted.  No guarding or rebound.   Vascular : all pulses equal ; no bruits present.  Skin:Warm & dry.  Intact without suspicious lesions or rashes ; no tenting .   Lymphatic: No lymphadenopathy is noted about the head, neck, axilla.   Neuro: Strength, tone & DTRs normal.      Assessment & Plan:  See Current Assessment & Plan in Problem List under specific Diagnosis

## 2015-04-18 ENCOUNTER — Ambulatory Visit: Payer: Medicare Other | Admitting: Internal Medicine

## 2015-05-17 ENCOUNTER — Telehealth: Payer: Self-pay

## 2015-05-17 ENCOUNTER — Other Ambulatory Visit: Payer: Self-pay | Admitting: Internal Medicine

## 2015-05-17 NOTE — Telephone Encounter (Signed)
Left message advising patient that she did not keep prior appt with dr burns to get established---i am getting rx refill request for her bp med, i have sent a 30 day supply to her pharm until she can call back to reschedule appt---patient must keep next appt before any further refills can be sent---any questions, can talk with Aileana Hodder

## 2015-06-01 ENCOUNTER — Other Ambulatory Visit: Payer: Self-pay | Admitting: Internal Medicine

## 2015-06-20 ENCOUNTER — Other Ambulatory Visit: Payer: Self-pay | Admitting: Obstetrics and Gynecology

## 2015-06-20 ENCOUNTER — Ambulatory Visit
Admission: RE | Admit: 2015-06-20 | Discharge: 2015-06-20 | Disposition: A | Discharge status: Home / Self Care | Payer: Medicare Other | Source: Ambulatory Visit | Attending: Obstetrics and Gynecology | Admitting: Obstetrics and Gynecology

## 2015-06-20 DIAGNOSIS — R921 Mammographic calcification found on diagnostic imaging of breast: Secondary | ICD-10-CM

## 2015-06-28 ENCOUNTER — Other Ambulatory Visit: Payer: Self-pay | Admitting: Adult Health

## 2015-07-13 ENCOUNTER — Telehealth: Payer: Self-pay | Admitting: *Deleted

## 2015-07-13 ENCOUNTER — Encounter: Payer: Self-pay | Admitting: Adult Health

## 2015-07-13 ENCOUNTER — Other Ambulatory Visit (INDEPENDENT_AMBULATORY_CARE_PROVIDER_SITE_OTHER): Payer: Medicare Other | Admitting: Adult Health

## 2015-07-13 ENCOUNTER — Other Ambulatory Visit (HOSPITAL_COMMUNITY)
Admission: RE | Admit: 2015-07-13 | Discharge: 2015-07-13 | Disposition: A | Discharge status: Home / Self Care | Payer: Medicare Other | Source: Ambulatory Visit | Attending: Adult Health | Admitting: Adult Health

## 2015-07-13 ENCOUNTER — Ambulatory Visit (INDEPENDENT_AMBULATORY_CARE_PROVIDER_SITE_OTHER): Payer: Medicare Other | Admitting: Adult Health

## 2015-07-13 VITALS — BP 180/90 | HR 70 | Ht 64.0 in | Wt 220.0 lb

## 2015-07-13 DIAGNOSIS — Z1151 Encounter for screening for human papillomavirus (HPV): Secondary | ICD-10-CM | POA: Insufficient documentation

## 2015-07-13 DIAGNOSIS — Z853 Personal history of malignant neoplasm of breast: Secondary | ICD-10-CM | POA: Diagnosis not present

## 2015-07-13 DIAGNOSIS — L80 Vitiligo: Secondary | ICD-10-CM | POA: Diagnosis not present

## 2015-07-13 DIAGNOSIS — Z1212 Encounter for screening for malignant neoplasm of rectum: Secondary | ICD-10-CM | POA: Diagnosis not present

## 2015-07-13 DIAGNOSIS — Z124 Encounter for screening for malignant neoplasm of cervix: Secondary | ICD-10-CM

## 2015-07-13 DIAGNOSIS — Z01419 Encounter for gynecological examination (general) (routine) without abnormal findings: Secondary | ICD-10-CM | POA: Diagnosis present

## 2015-07-13 DIAGNOSIS — Z139 Encounter for screening, unspecified: Secondary | ICD-10-CM

## 2015-07-13 HISTORY — DX: Personal history of malignant neoplasm of breast: Z85.3

## 2015-07-13 LAB — HEMOCCULT GUIAC POC 1CARD (OFFICE): FECAL OCCULT BLD: NEGATIVE

## 2015-07-13 NOTE — Patient Instructions (Signed)
Pap in 2 years Referred to Dr Oneida Alar Mammogram yearly

## 2015-07-13 NOTE — Progress Notes (Signed)
Subjective:     Patient ID: [name], female   DOB: 08-Mar-1946, 70 y.o.   MRN: [id number]  HPI [name] is a 70 year old black female, single, G0P0, new to this practice in for a pap smear and says she is irritated when wipes at times, has vitiligo.She had breast cancer in 2007.She has a boyfriend and has no complaints with intercourse.  PCP is Dr Drema Dallas in [place], Dr Huey Bienenstock retired.  Review of Systems Patient denies any headaches, hearing loss, fatigue, blurred vision, shortness of breath, chest pain, abdominal pain, problems with bowel movements, urination, or intercourse. No joint pain or mood swings.Has irritation when wipes, she thinks it is her vitiligo   Reviewed past medical,surgical, social and family history. Reviewed medications and allergies.     Objective:   Physical Exam BP 180/90 mmHg  Pulse 70  Ht 5\' 4"  (1.626 m)  Wt 220 lb (99.791 kg)  BMI 37.74 kg/m2   Skin warm and dry.Pelvic: external genitalia is normal in appearance for age, has vitiligo,  vagina: pale pink, with good moisture, and loss of rugae,urethra has no lesions or masses noted, cervix:smooth, pap with HPV performed, uterus: normal size, shape and contour, non tender, no masses felt, adnexa: no masses or tenderness noted. Bladder is non tender and no masses felt. On rectal exam has good tone, no hemorrhoids and hemoccult was negative.She has used cream in past for vitiligo, will call me with name.Discussed that can stop paps at 72. She wants referral for colonoscopy in [place].   Assessment:     Routine pap smear Vitiligo History of breast cancer    Plan:    Pap in 2 years if desired Referred to Dr Oneida Alar for colonoscopy Mammogram yearly Call  Me with name of cream used in past for vitiligo

## 2015-07-13 NOTE — Telephone Encounter (Signed)
She has some Elidel cream at home, use bid x 1-2 weeks and see if better then can just use 2-3 x weekly

## 2015-07-15 LAB — CYTOLOGY - PAP

## 2015-07-18 ENCOUNTER — Ambulatory Visit: Payer: Medicare Other | Admitting: Internal Medicine

## 2015-07-20 ENCOUNTER — Other Ambulatory Visit (INDEPENDENT_AMBULATORY_CARE_PROVIDER_SITE_OTHER): Payer: Medicare Other

## 2015-07-20 ENCOUNTER — Encounter: Payer: Self-pay | Admitting: Internal Medicine

## 2015-07-20 ENCOUNTER — Ambulatory Visit (INDEPENDENT_AMBULATORY_CARE_PROVIDER_SITE_OTHER): Payer: Medicare Other | Admitting: Internal Medicine

## 2015-07-20 VITALS — BP 132/80 | HR 58 | Temp 98.5°F | Resp 16 | Wt 217.0 lb

## 2015-07-20 DIAGNOSIS — F17200 Nicotine dependence, unspecified, uncomplicated: Secondary | ICD-10-CM

## 2015-07-20 DIAGNOSIS — I10 Essential (primary) hypertension: Secondary | ICD-10-CM

## 2015-07-20 DIAGNOSIS — E785 Hyperlipidemia, unspecified: Secondary | ICD-10-CM | POA: Diagnosis not present

## 2015-07-20 DIAGNOSIS — Z1159 Encounter for screening for other viral diseases: Secondary | ICD-10-CM

## 2015-07-20 DIAGNOSIS — K219 Gastro-esophageal reflux disease without esophagitis: Secondary | ICD-10-CM | POA: Diagnosis not present

## 2015-07-20 DIAGNOSIS — R739 Hyperglycemia, unspecified: Secondary | ICD-10-CM

## 2015-07-20 LAB — LIPID PANEL
CHOL/HDL RATIO: 5
Cholesterol: 216 mg/dL — ABNORMAL HIGH (ref 0–200)
HDL: 45.9 mg/dL (ref >39.00)
LDL Cholesterol: 140 mg/dL — ABNORMAL HIGH (ref 0–99)
NONHDL: 169.81
Triglycerides: 148 mg/dL (ref 0.0–149.0)
VLDL: 29.6 mg/dL (ref 0.0–40.0)

## 2015-07-20 LAB — COMPREHENSIVE METABOLIC PANEL
ALBUMIN: 4.1 g/dL (ref 3.5–5.2)
ALK PHOS: 90 U/L (ref 39–117)
ALT: 8 U/L (ref 0–35)
AST: 10 U/L (ref 0–37)
BILIRUBIN TOTAL: 0.5 mg/dL (ref 0.2–1.2)
BUN: 16 mg/dL (ref 6–23)
CO2: 29 mEq/L (ref 19–32)
Calcium: 9.5 mg/dL (ref 8.4–10.5)
Chloride: 104 mEq/L (ref 96–112)
Creatinine, Ser: 0.86 mg/dL (ref 0.40–1.20)
GFR: 84.36 mL/min (ref >60.00)
GLUCOSE: 114 mg/dL — AB (ref 70–99)
POTASSIUM: 4.3 meq/L (ref 3.5–5.1)
SODIUM: 139 meq/L (ref 135–145)
TOTAL PROTEIN: 7.7 g/dL (ref 6.0–8.3)

## 2015-07-20 LAB — HEMOGLOBIN A1C: HEMOGLOBIN A1C: 6.1 % (ref 4.6–6.5)

## 2015-07-20 LAB — HEPATITIS C ANTIBODY: HCV AB: NEGATIVE

## 2015-07-20 MED ORDER — LABETALOL HCL 200 MG PO TABS: 200.0000 mg | ORAL_TABLET | Freq: Two times a day (BID) | ORAL | Status: DC

## 2015-07-20 MED ORDER — AMLODIPINE BESYLATE 10 MG PO TABS: 10.0000 mg | ORAL_TABLET | Freq: Every day | ORAL | Status: DC

## 2015-07-20 MED ORDER — OMEPRAZOLE 40 MG PO CPDR: DELAYED_RELEASE_CAPSULE | ORAL | Status: DC

## 2015-07-20 NOTE — Patient Instructions (Signed)
Work on quitting smoking and increasing your activity.  Test(s) ordered today. Your results will be released to [place] (or called to you) after review, usually within 72hours after test completion. If any changes need to be made, you will be notified at that same time.  All other Health Maintenance issues reviewed.   All recommended immunizations and age-appropriate screenings are up-to-date or discussed.  No immunizations administered today.   Medications reviewed and updated.  No changes recommended at this time.  Your prescription(s) have been submitted to your pharmacy. Please take as directed and contact our office if you believe you are having problem(s) with the medication(s).    Please followup in one year with me and later this year with susan for a wellness visit

## 2015-07-20 NOTE — Progress Notes (Signed)
Pre visit review using our clinic review tool, if applicable. No additional management support is needed unless otherwise documented below in the visit note. 

## 2015-07-20 NOTE — Progress Notes (Signed)
Subjective:    Patient ID: [name], female    DOB: 10-Jul-1945, 70 y.o.   MRN: [id number]  HPI She is here to establish with a new pcp.  She is here for follow up.  Hypertension: She is taking her medication daily. She is compliant with a low sodium diet.  She denies chest pain, palpitations, edema, frequent shortness of breath and regular headaches. She is not exercising regularly.  She does not monitor her blood pressure at home.    Hyperlipidemia: She is taking her medication daily. She is compliant with a low fat/cholesterol diet. She is not exercising regularly. She denies myalgias.   GERD:  She is taking her medication daily as prescribed.  She denies any GERD symptoms and feels her GERD is well controlled.   Right foot pain:  She takes tramadol as needed only.  She fell on ice years ago and that is how it got injured.    She does smoke and knows she should quit. She is not 100% committed to quitting.   Medications and allergies reviewed with patient and updated if appropriate.  Patient Active Problem List   Diagnosis Date Noted  . History of breast cancer 07/13/2015  . Vitiligo 07/07/2012  . PLANTAR FASCIITIS 06/16/2010  . CIGARETTE SMOKER 06/14/2009  . Hyperlipidemia 11/10/2008  . Hyperglycemia 11/10/2008  . ANGIONEUROTIC EDEMA 01/09/2008  . CAROTID ARTERY STENOSIS, BILATERAL 06/17/2007  . Essential hypertension 06/12/2007  . Personal history of malignant neoplasm of breast 02/25/2007  . AMENORRHEA 02/21/2007  . RHEUMATIC FEVER, HX OF 02/21/2007    Current Outpatient Prescriptions on File Prior to Visit  Medication Sig Dispense Refill  . amLODipine (NORVASC) 10 MG tablet TAKE ONE TABLET BY MOUTH DAILY. 90 tablet 1  . aspirin EC 81 MG tablet Take 1 tablet (81 mg total) by mouth daily.    Marland Kitchen atorvastatin (LIPITOR) 20 MG tablet Take 1 tablet (20 mg total) by mouth daily. 90 tablet 3  . labetalol (NORMODYNE) 200 MG tablet Take 1 tablet (200 mg total) by mouth 2  (two) times daily. ---patient needs to schedule office visit with dr burns before any further refills 60 tablet 0  . omeprazole (PRILOSEC) 40 MG capsule TAKE ONE CAPSULE BY MOUTH ONCE DAILY. 90 capsule 0  . spironolactone (ALDACTONE) 25 MG tablet Take 1 tablet (25 mg total) by mouth 2 (two) times daily. 180 tablet 3  . traMADol (ULTRAM) 50 MG tablet Take 1 tablet (50 mg total) by mouth every 8 (eight) hours as needed. 30 tablet 0   No current facility-administered medications on file prior to visit.    Past Medical History  Diagnosis Date  . Occlusion and stenosis of carotid artery without mention of cerebral infarction   . HLD (hyperlipidemia)   . Unspecified essential hypertension   . Other abnormal glucose     fasting hyperglycemia  . Angioneurotic edema not elsewhere classified     due to ACE-I  . Personal history of malignant neoplasm of breast   . Absence of menstruation     amenorrhea  . H/O: rheumatic fever   . Carotid stenosis     Carotid US (8/15):  RICA 123456; LICA 123456 >>> FU 1 year  . History of breast cancer 07/13/2015  . Breast disorder     breast cancer 2007    Past Surgical History  Procedure Laterality Date  . Appendectomy    . Colonoscopy  2005    negative; Dr Collene Mares  . Breast  lumpectomy    . G0 p0      Dr. Maudry Diego    Social History   Social History  . Marital Status: Single    Spouse Name: N/A  . Number of Children: N/A  . Years of Education: N/A   Social History Main Topics  . Smoking status: Current Some Day Smoker -- 0.50 packs/day for 48 years    Types: Cigarettes  . Smokeless tobacco: Never Used  . Alcohol Use: Yes     Comment: beer occ  . Drug Use: No  . Sexual Activity: Yes    Birth Control/ Protection: Post-menopausal   Other Topics Concern  . Not on file   Social History Narrative   Single, no children.   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    exercises regularly - walks 2 mpd weather permtting    Family History    Problem Relation Age of Onset  . Hypertension      some of Pts brothers and sisters  . Heart attack Neg Hx   . Stroke Neg Hx   . Diabetes Neg Hx   . Heart disease Neg Hx   . Breast cancer Sister     Review of Systems  Constitutional: Negative for fever.  Respiratory: Positive for shortness of breath (occasionally) and wheezing. Negative for cough.   Cardiovascular: Negative for chest pain, palpitations and leg swelling.  Neurological: Negative for dizziness, light-headedness and headaches.       Objective:   Filed Vitals:   07/20/15 0913  BP: 132/80  Pulse: 58  Temp: 98.5 F (36.9 C)  Resp: 16   Filed Weights   07/20/15 0913  Weight: 217 lb (98.431 kg)   Body mass index is 37.23 kg/(m^2).   Physical Exam Constitutional: Appears well-developed and well-nourished. No distress.  Neck: Neck supple. No tracheal deviation present. No thyromegaly present.  right carotid bruit. No cervical adenopathy.   Cardiovascular: Normal rate, regular rhythm and normal heart sounds.   No murmur heard.  No edema Pulmonary/Chest: Effort normal and breath sounds normal. No respiratory distress. No wheezes.       Assessment & Plan:   See Problem List for Assessment and Plan of chronic medical problems.   F/u with me in one year, later this year with susan for a wellness visit Follow up annually

## 2015-07-21 NOTE — Assessment & Plan Note (Signed)
BP Readings from Last 3 Encounters:  07/20/15 132/80  07/13/15 180/90  03/09/15 188/80   Controlled today Increase exercise, work on weight loss  Continue current meds Check cmp

## 2015-07-21 NOTE — Assessment & Plan Note (Signed)
Check lipid panel, cmp Taking lipitor 20 mg daily Work on weight loss and increasing exercise

## 2015-07-21 NOTE — Assessment & Plan Note (Signed)
stressed smoking cessation Discussed options to help her quit - may consider wellbutrin

## 2015-07-21 NOTE — Assessment & Plan Note (Signed)
Check a1c Exercise regularly, work on weight loss

## 2015-07-21 NOTE — Assessment & Plan Note (Signed)
GERD controlled Work on weight loss Continue daily omeprazole - discussed trying to decrease to 20 mg daily Smoking cessation discussed

## 2015-07-24 ENCOUNTER — Encounter: Payer: Self-pay | Admitting: Internal Medicine

## 2015-07-24 DIAGNOSIS — R7303 Prediabetes: Secondary | ICD-10-CM | POA: Insufficient documentation

## 2015-07-26 ENCOUNTER — Telehealth: Payer: Self-pay | Admitting: Emergency Medicine

## 2015-07-26 ENCOUNTER — Other Ambulatory Visit: Payer: Self-pay | Admitting: Obstetrics and Gynecology

## 2015-07-26 ENCOUNTER — Other Ambulatory Visit: Payer: Self-pay | Admitting: Obstetrics & Gynecology

## 2015-07-26 DIAGNOSIS — M79672 Pain in left foot: Secondary | ICD-10-CM

## 2015-07-26 DIAGNOSIS — Z853 Personal history of malignant neoplasm of breast: Secondary | ICD-10-CM

## 2015-07-26 MED ORDER — TRAMADOL HCL 50 MG PO TABS: 50.0000 mg | ORAL_TABLET | Freq: Three times a day (TID) | ORAL | Status: DC | PRN

## 2015-07-26 NOTE — Telephone Encounter (Signed)
Refill request from pts pharmacy for Tramadol. Please advise if okay to fill

## 2015-07-26 NOTE — Telephone Encounter (Signed)
printed

## 2015-07-27 ENCOUNTER — Other Ambulatory Visit: Payer: Self-pay | Admitting: Obstetrics and Gynecology

## 2015-07-27 DIAGNOSIS — Z853 Personal history of malignant neoplasm of breast: Secondary | ICD-10-CM

## 2015-07-27 NOTE — Telephone Encounter (Signed)
RX faxed to pharmacy.

## 2015-07-28 ENCOUNTER — Telehealth: Payer: Self-pay

## 2015-07-28 NOTE — Telephone Encounter (Signed)
Pt received a triage letter from [facility]. Please call [phone]. She works part time and will be home around 230pm

## 2015-08-16 NOTE — Addendum Note (Signed)
Addended by: Marlou Porch on: 08/16/2015 04:17 PM   Modules accepted: Medications

## 2015-08-16 NOTE — Telephone Encounter (Signed)
Gastroenterology Pre-Procedure Review  Request Date: Requesting Physician:   PATIENT REVIEW QUESTIONS: The patient responded to the following health history questions as indicated:    1. Diabetes Melitis: NO 2. Joint replacements in the past 12 months: NO 3. Major health problems in the past 3 months: NO 4. Has an artificial valve or MVP: NO 5. Has a defibrillator: NO 6. Has been advised in past to take antibiotics in advance of a procedure like teeth cleaning: NO 7. Family history of colon cancer: NO  8. Alcohol Use: YES- 2-3 TIMES OUT OF THE MONTH 9. History of sleep apnea: NO    MEDICATIONS & ALLERGIES:    Patient reports the following regarding taking any blood thinners:   Plavix? NO Aspirin? YES ASP 81 MG Coumadin? NO  Patient confirms/reports the following medications:  Current Outpatient Prescriptions  Medication Sig Dispense Refill  . amLODipine (NORVASC) 10 MG tablet Take 1 tablet (10 mg total) by mouth daily. 90 tablet 3  . atorvastatin (LIPITOR) 20 MG tablet Take 1 tablet (20 mg total) by mouth daily. 90 tablet 3  . labetalol (NORMODYNE) 200 MG tablet Take 1 tablet (200 mg total) by mouth 2 (two) times daily. 180 tablet 3  . omeprazole (PRILOSEC) 40 MG capsule TAKE ONE CAPSULE BY MOUTH ONCE DAILY. 90 capsule 3  . traMADol (ULTRAM) 50 MG tablet Take 1 tablet (50 mg total) by mouth every 8 (eight) hours as needed. (Patient taking differently: Take 50 mg by mouth every 8 (eight) hours as needed. Has not taken in 2 weeks only PRN) 30 tablet 0  . aspirin EC 81 MG tablet Take 1 tablet (81 mg total) by mouth daily. (Patient not taking: Reported on 08/16/2015)    . spironolactone (ALDACTONE) 25 MG tablet Take 1 tablet (25 mg total) by mouth 2 (two) times daily. (Patient not taking: Reported on 08/16/2015) 180 tablet 3   No current facility-administered medications for this visit.    Patient confirms/reports the following allergies:  Allergies  Allergen Reactions  . Benazepril  Hcl     REACTION: FACE SWELLING (ANGIOEDEMA); she can not take ARBS  !!! Because of a history of documented adverse serious drug reaction;Medi Alert bracelet  is recommended  . Peanut-Containing Drug Products     03/13/13 hivesw/o facial swelling  . Shellfish Allergy     03/15/13 hives & facial swelling  . Yellow Dyes (Non-Tartrazine)     12/14 hives  & facial swelling with Christene Lye Aid    No orders of the defined types were placed in this encounter.    AUTHORIZATION INFORMATION Primary Insurance: [place],  [place] #: [id number],  Group #: [id number] Pre-Cert / Josem Kaufmann required:  Pre-Cert / Auth #:   Secondary Insurance: MEDICARE,  ID #[id number],  Group #:  Pre-Cert / Auth required:  Pre-Cert / Auth #:   SCHEDULE INFORMATION: Procedure has been scheduled as follows:  Date: , Time:  Location:   This Gastroenterology Pre-Precedure Review Form is being routed to the following provider(s): [facility]

## 2015-08-17 ENCOUNTER — Other Ambulatory Visit: Payer: Self-pay

## 2015-08-17 DIAGNOSIS — Z1211 Encounter for screening for malignant neoplasm of colon: Secondary | ICD-10-CM

## 2015-08-17 MED ORDER — PEG 3350-KCL-NA BICARB-NACL 420 G PO SOLR: 4000.0000 mL | ORAL | Status: DC

## 2015-08-17 NOTE — Telephone Encounter (Signed)
Ok to schedule.

## 2015-08-17 NOTE — Telephone Encounter (Signed)
Tried to call with no answer  

## 2015-08-17 NOTE — Telephone Encounter (Signed)
Pt is set up for TCS on 09/02/15 @ 12:15 pm. She is aware of date and time. I have mailed her instructions and sent in her Rx to the pharmacy

## 2015-09-01 ENCOUNTER — Telehealth: Payer: Self-pay

## 2015-09-01 NOTE — Telephone Encounter (Signed)
Called pt to change her time for her TCS in the morning. We have moved her for 1115 to be there at 10:30.I left a message for her and she can call back if she has any questions.

## 2015-09-02 ENCOUNTER — Ambulatory Visit (HOSPITAL_COMMUNITY)
Admission: RE | Admit: 2015-09-02 | Discharge: 2015-09-02 | Disposition: A | Discharge status: Home / Self Care | Payer: Medicare Other | Source: Ambulatory Visit | Attending: Gastroenterology | Admitting: Gastroenterology

## 2015-09-02 ENCOUNTER — Encounter (HOSPITAL_COMMUNITY): Payer: Self-pay | Admitting: *Deleted

## 2015-09-02 ENCOUNTER — Encounter (HOSPITAL_COMMUNITY)
Admission: RE | Disposition: A | Discharge status: Home / Self Care | Payer: Self-pay | Source: Ambulatory Visit | Attending: Gastroenterology

## 2015-09-02 DIAGNOSIS — D122 Benign neoplasm of ascending colon: Secondary | ICD-10-CM | POA: Diagnosis not present

## 2015-09-02 DIAGNOSIS — Z91041 Radiographic dye allergy status: Secondary | ICD-10-CM | POA: Diagnosis not present

## 2015-09-02 DIAGNOSIS — Z91010 Allergy to peanuts: Secondary | ICD-10-CM | POA: Insufficient documentation

## 2015-09-02 DIAGNOSIS — F17210 Nicotine dependence, cigarettes, uncomplicated: Secondary | ICD-10-CM | POA: Diagnosis not present

## 2015-09-02 DIAGNOSIS — K621 Rectal polyp: Secondary | ICD-10-CM | POA: Diagnosis not present

## 2015-09-02 DIAGNOSIS — Z853 Personal history of malignant neoplasm of breast: Secondary | ICD-10-CM | POA: Insufficient documentation

## 2015-09-02 DIAGNOSIS — Z1211 Encounter for screening for malignant neoplasm of colon: Secondary | ICD-10-CM | POA: Insufficient documentation

## 2015-09-02 DIAGNOSIS — Q438 Other specified congenital malformations of intestine: Secondary | ICD-10-CM | POA: Diagnosis not present

## 2015-09-02 DIAGNOSIS — D128 Benign neoplasm of rectum: Secondary | ICD-10-CM | POA: Diagnosis not present

## 2015-09-02 DIAGNOSIS — E669 Obesity, unspecified: Secondary | ICD-10-CM | POA: Diagnosis not present

## 2015-09-02 DIAGNOSIS — Z6837 Body mass index (BMI) 37.0-37.9, adult: Secondary | ICD-10-CM | POA: Diagnosis not present

## 2015-09-02 DIAGNOSIS — E785 Hyperlipidemia, unspecified: Secondary | ICD-10-CM | POA: Diagnosis not present

## 2015-09-02 DIAGNOSIS — I10 Essential (primary) hypertension: Secondary | ICD-10-CM | POA: Diagnosis not present

## 2015-09-02 DIAGNOSIS — Z888 Allergy status to other drugs, medicaments and biological substances status: Secondary | ICD-10-CM | POA: Insufficient documentation

## 2015-09-02 DIAGNOSIS — D123 Benign neoplasm of transverse colon: Secondary | ICD-10-CM | POA: Insufficient documentation

## 2015-09-02 DIAGNOSIS — I6523 Occlusion and stenosis of bilateral carotid arteries: Secondary | ICD-10-CM | POA: Insufficient documentation

## 2015-09-02 HISTORY — PX: COLONOSCOPY: SHX5424

## 2015-09-02 HISTORY — DX: Malignant (primary) neoplasm, unspecified: C80.1

## 2015-09-02 SURGERY — COLONOSCOPY
Anesthesia: Moderate Sedation

## 2015-09-02 MED ORDER — MEPERIDINE HCL 100 MG/ML IJ SOLN
INTRAMUSCULAR | Status: DC | PRN
Start: 2015-09-02 — End: 2015-09-02
  Administered 2015-09-02 (×3): 25 mg via INTRAVENOUS

## 2015-09-02 MED ORDER — MEPERIDINE HCL 100 MG/ML IJ SOLN
INTRAMUSCULAR | Status: AC
  Filled 2015-09-02: qty 2

## 2015-09-02 MED ORDER — STERILE WATER FOR IRRIGATION IR SOLN
Status: DC | PRN
  Administered 2015-09-02: 12:00:00

## 2015-09-02 MED ORDER — SODIUM CHLORIDE 0.9 % IV SOLN
INTRAVENOUS | Status: DC
Start: 2015-09-02 — End: 2015-09-02
  Administered 2015-09-02: 1000 mL via INTRAVENOUS

## 2015-09-02 MED ORDER — MIDAZOLAM HCL 5 MG/5ML IJ SOLN
INTRAMUSCULAR | Status: AC
  Filled 2015-09-02: qty 10

## 2015-09-02 MED ORDER — MIDAZOLAM HCL 5 MG/5ML IJ SOLN
INTRAMUSCULAR | Status: DC | PRN
  Administered 2015-09-02 (×3): 2 mg via INTRAVENOUS

## 2015-09-02 NOTE — H&P (Signed)
Primary Care Physician:  Binnie Rail, MD Primary Gastroenterologist:  Dr. Oneida Alar  Pre-Procedure History & Physical: HPI:  [name] is a 70 y.o. female here for [place].  Past Medical History  Diagnosis Date  . Occlusion and stenosis of carotid artery without mention of cerebral infarction   . HLD (hyperlipidemia)   . Unspecified essential hypertension   . Other abnormal glucose     fasting hyperglycemia  . Angioneurotic edema not elsewhere classified     due to ACE-I  . Personal history of malignant neoplasm of breast   . Absence of menstruation     amenorrhea  . H/O: rheumatic fever   . Carotid stenosis     Carotid US (8/15):  RICA 123456; LICA 123456 >>> FU 1 year  . History of breast cancer 07/13/2015  . Breast disorder     breast cancer 2007  . Cancer [facility]) 2007    Left Breast Cancer    Past Surgical History  Procedure Laterality Date  . Appendectomy    . Colonoscopy  2005    negative; Dr Collene Mares  . Breast lumpectomy    . G0 p0      Dr. Maudry Diego    Prior to Admission medications   Medication Sig Start Date End Date Taking? Authorizing Provider  amLODipine (NORVASC) 10 MG tablet Take 1 tablet (10 mg total) by mouth daily. 07/20/15  Yes Binnie Rail, MD  labetalol (NORMODYNE) 200 MG tablet Take 1 tablet (200 mg total) by mouth 2 (two) times daily. Patient taking differently: Take 200 mg by mouth daily.  07/20/15  Yes Binnie Rail, MD  omeprazole (PRILOSEC) 40 MG capsule TAKE ONE CAPSULE BY MOUTH ONCE DAILY. 07/20/15  Yes Binnie Rail, MD  polyethylene glycol-electrolytes (TRILYTE) 420 g solution Take 4,000 mLs by mouth as directed. 08/17/15  Yes Danie Binder, MD  traMADol (ULTRAM) 50 MG tablet Take 1 tablet (50 mg total) by mouth every 8 (eight) hours as needed. Patient taking differently: Take 50 mg by mouth every 8 (eight) hours as needed for moderate pain.  07/26/15  Yes Binnie Rail, MD    Allergies as of 08/17/2015 - Review Complete 07/20/2015   Allergen Reaction Noted  . Benazepril hcl    . Peanut-containing drug products  03/19/2013  . Shellfish allergy  03/19/2013  . Yellow dyes (non-tartrazine)  03/19/2013    Family History  Problem Relation Age of Onset  . Hypertension      some of Pts brothers and sisters  . Heart attack Neg Hx   . Stroke Neg Hx   . Diabetes Neg Hx   . Heart disease Neg Hx   . Breast cancer Sister     Social History   Social History  . Marital Status: Single    Spouse Name: N/A  . Number of Children: N/A  . Years of Education: N/A   Occupational History  . Not on file.   Social History Main Topics  . Smoking status: Current Some Day Smoker -- 0.50 packs/day for 48 years    Types: Cigarettes  . Smokeless tobacco: Never Used  . Alcohol Use: No  . Drug Use: No  . Sexual Activity: Yes    Birth Control/ Protection: Post-menopausal   Other Topics Concern  . Not on file   Social History Narrative   Single, no children.   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    exercises regularly - walks 2 mpd  weather permtting    Review of Systems: See HPI, otherwise negative ROS   Physical Exam: BP 164/65 mmHg  Pulse 64  Temp(Src) 98.4 F (36.9 C) (Oral)  Resp 13  Ht 5\' 4"  (1.626 m)  Wt 217 lb (98.431 kg)  BMI 37.23 kg/m2  SpO2 100% General:   Alert,  pleasant and cooperative in NAD Head:  Normocephalic and atraumatic. Neck:  Supple; Lungs:  Clear throughout to auscultation.    Heart:  Regular rate and rhythm. Abdomen:  Soft, nontender and nondistended. Normal bowel sounds, without guarding, and without rebound.   Neurologic:  Alert and  oriented x4;  grossly normal neurologically.  Impression/Plan:     SCREENING  Plan:  1. TCS TODAY

## 2015-09-02 NOTE — Op Note (Signed)
[facility] Patient Name: [name] Procedure Date: 09/02/2015 11:50 AM MRN: [id number] Date of Birth: 23-Apr-1945 Attending MD: Barney Drain , MD CSN: [id number] Age: 70 Admit Type: Outpatient Procedure:                Colonoscopy WITH COLD FORCEPS/SNARE CAUTERY                            POLYPECTOMY Indications:              Screening for colorectal malignant neoplasm Providers:                Barney Drain, MD, Hinton Rao, RN, Georgeann Oppenheim,                            Technician Referring MD:             Binnie Rail, MD Medicines:                Meperidine 75 mg IV, Midazolam 6 mg IV Complications:            No immediate complications. Estimated Blood Loss:     Estimated blood loss was minimal. Procedure:                Pre-Anesthesia Assessment:                           - Prior to the procedure, a History and Physical                            was performed, and patient medications and                            allergies were reviewed. The patient's tolerance of                            previous anesthesia was also reviewed. The risks                            and benefits of the procedure and the sedation                            options and risks were discussed with the patient.                            All questions were answered, and informed consent                            was obtained. Prior Anticoagulants: The patient has                            taken no previous anticoagulant or antiplatelet                            agents. ASA Grade Assessment: II - A patient with  mild systemic disease. After reviewing the risks                            and benefits, the patient was deemed in                            satisfactory condition to undergo the procedure.                           After obtaining informed consent, the colonoscope                            was passed under direct vision. Throughout the               procedure, the patient's blood pressure, pulse, and                            oxygen saturations were monitored continuously. The                            [id number] scope was introduced through                            the anus and advanced to the the cecum, identified                            by appendiceal orifice and ileocecal valve. The                            ileocecal valve, appendiceal orifice, and rectum                            were photographed. The colonoscopy was performed                            without difficulty. The patient tolerated the                            procedure well. The quality of the bowel                            preparation was good. SLIGHTLY REDUNDANT COLON. Scope In: 12:07:44 PM Scope Out: 12:25:48 PM Scope Withdrawal Time: 0 hours 14 minutes 42 seconds  Total Procedure Duration: 0 hours 18 minutes 4 seconds  Findings:      Two sessile polyps were found in the splenic flexure and ascending       colon. The polyps were 6 to 7 mm in size. These polyps were removed with       a hot snare. Resection and retrieval were complete.      Three sessile polyps were found in the rectum. The polyps were 2 to 3 mm       in size. These polyps were removed with a cold biopsy forceps. Resection       and retrieval were complete. Impression:               -  Two 6 to 7 mm polyps at the splenic flexure and                            in the ascending colon, removed with a hot snare.                            Resected and retrieved.                           - Three 2 to 3 mm polyps in the rectum, removed                            with a cold biopsy forceps. Resected and retrieved. Moderate Sedation:      Moderate (conscious) sedation was administered by the endoscopy nurse       and supervised by the endoscopist. The following parameters were       monitored: oxygen saturation, heart rate, blood pressure, and response       to care.  Total physician intraservice time was 35 minutes. Recommendation:           - High fiber diet.                           - Continue present medications.                           - Await pathology results.                           - Repeat colonoscopy in 5-10 years for surveillance.                           DRINK WATER TO KEEP YOUR URINE LIGHT YELLOW.                           CONTINUE YOUR WEIGHT LOSS EFFORTS. BODY MASS INDEX                            IS OVER 30 WHICH MEANS YOU ARE OBESE. OBESITY IS                            ASSOCIATED WITH AN INCREASE FOR ALL CANCERS,                            INCLUDING ESOPHAGEAL, BREAST, AND COLON CANCER.                            LOSE 10 POUNDS.                           FOLLOW A HIGH FIBER DIET. AVOID ITEMS THAT CAUSE                            BLOATING & GAS.                           -  Patient has a contact number available for                            emergencies. The signs and symptoms of potential                            delayed complications were discussed with the                            patient. Return to normal activities tomorrow.                            Written discharge instructions were provided to the                            patient. Procedure Code(s):        --- Professional ---                           [id number], Colonoscopy, flexible; with removal of                            tumor(s), polyp(s), or other lesion(s) by snare                            technique                           45380, 59, Colonoscopy, flexible; with biopsy,                            single or multiple                           99153, Moderate sedation services; each additional                            15 minutes intraservice time                           G0500, Moderate sedation services provided by the                            same physician or other qualified health care                            professional performing a  gastrointestinal                            endoscopic service that sedation supports,                            requiring the presence of an independent trained                            observer to assist in the monitoring of the  patient's level of consciousness and physiological                            status; initial 15 minutes of intra-service time;                            patient age 20 years or older (additional time may                            be reported with [phone], as appropriate) Diagnosis Code(s):        --- Professional ---                           Z12.11, Encounter for screening for malignant                            neoplasm of colon                           D12.3, Benign neoplasm of transverse colon (hepatic                            flexure or splenic flexure)                           D12.2, Benign neoplasm of ascending colon                           K62.1, Rectal polyp CPT copyright 2016 American Medical Association. All rights reserved. The codes documented in this report are preliminary and upon coder review may  be revised to meet current compliance requirements. Barney Drain, MD Barney Drain, MD 09/02/2015 3:22:16 PM This report has been signed electronically. Number of Addenda: 0

## 2015-09-02 NOTE — Discharge Instructions (Signed)
You had 5 polyps removed. You have SMALL internal hemorrhoids.   DRINK WATER TO KEEP YOUR URINE LIGHT YELLOW.  CONTINUE YOUR WEIGHT LOSS EFFORTS. YOUR BODY MASS INDEX IS OVER 30 WHICH MEANS YOU ARE OBESE. OBESITY IS ASSOCIATED WITH AN INCREASE FOR ALL CANCERS, INCLUDING ESOPHAGEAL, BREAST, AND COLON CANCER. LOSE 10 POUNDS.  FOLLOW A HIGH FIBER DIET. AVOID ITEMS THAT CAUSE BLOATING & GAS. SEE INFO BELOW.  YOUR BIOPSY RESULTS WILL BE AVAILABLE IN MY CHART MAY 31 AND MY OFFICE WILL CONTACT YOU IN 10-14 DAYS WITH YOUR RESULTS.   Next colonoscopy in 5-10 years.    Colonoscopy Care After Read the instructions outlined below and refer to this sheet in the next week. These discharge instructions provide you with general information on caring for yourself after you leave the hospital. While your treatment has been planned according to the most current medical practices available, unavoidable complications occasionally occur. If you have any problems or questions after discharge, call DR. Aaryn Sermon, [phone].  ACTIVITY  You may resume your regular activity, but move at a slower pace for the next 24 hours.   Take frequent rest periods for the next 24 hours.   Walking will help get rid of the air and reduce the bloated feeling in your belly (abdomen).   No driving for 24 hours (because of the medicine (anesthesia) used during the test).   You may shower.   Do not sign any important legal documents or operate any machinery for 24 hours (because of the anesthesia used during the test).    NUTRITION  Drink plenty of fluids.   You may resume your normal diet as instructed by your doctor.   Begin with a light meal and progress to your normal diet. Heavy or fried foods are harder to digest and may make you feel sick to your stomach (nauseated).   Avoid alcoholic beverages for 24 hours or as instructed.    MEDICATIONS  You may resume your normal medications.   WHAT YOU CAN EXPECT  TODAY  Some feelings of bloating in the abdomen.   Passage of more gas than usual.   Spotting of blood in your stool or on the toilet paper  .  IF YOU HAD POLYPS REMOVED DURING THE COLONOSCOPY:  Eat a soft diet IF YOU HAVE NAUSEA, BLOATING, ABDOMINAL PAIN, OR VOMITING.    FINDING OUT THE RESULTS OF YOUR TEST Not all test results are available during your visit. DR. Oneida Alar WILL CALL YOU WITHIN 14 DAYS OF YOUR PROCEDUE WITH YOUR RESULTS. Do not assume everything is normal if you have not heard from DR. Tylan Briguglio, CALL HER OFFICE AT [phone].  SEEK IMMEDIATE MEDICAL ATTENTION AND CALL THE OFFICE: [phone] IF:  You have more than a spotting of blood in your stool.   Your belly is swollen (abdominal distention).   You are nauseated or vomiting.   You have a temperature over 101F.   You have abdominal pain or discomfort that is severe or gets worse throughout the day.   High-Fiber Diet A high-fiber diet changes your normal diet to include more whole grains, legumes, fruits, and vegetables. Changes in the diet involve replacing refined carbohydrates with unrefined foods. The calorie level of the diet is essentially unchanged. The Dietary Reference Intake (recommended amount) for adult males is 38 grams per day. For adult females, it is 25 grams per day. Pregnant and lactating women should consume 28 grams of fiber per day. Fiber is the intact part of a  plant that is not broken down during digestion. Functional fiber is fiber that has been isolated from the plant to provide a beneficial effect in the body. PURPOSE  Increase stool bulk.   Ease and regulate bowel movements.   Lower cholesterol.  REDUCE RISK OF COLON CANCER  INDICATIONS THAT YOU NEED MORE FIBER  Constipation and hemorrhoids.   Uncomplicated diverticulosis (intestine condition) and irritable bowel syndrome.   Weight management.   As a protective measure against hardening of the arteries (atherosclerosis),  diabetes, and cancer.   GUIDELINES FOR INCREASING FIBER IN THE DIET  Start adding fiber to the diet slowly. A gradual increase of about 5 more grams (2 slices of whole-wheat bread, 2 servings of most fruits or vegetables, or 1 bowl of high-fiber cereal) per day is best. Too rapid an increase in fiber may result in constipation, flatulence, and bloating.   Drink enough water and fluids to keep your urine clear or pale yellow. Water, juice, or caffeine-free drinks are recommended. Not drinking enough fluid may cause constipation.   Eat a variety of high-fiber foods rather than one type of fiber.   Try to increase your intake of fiber through using high-fiber foods rather than fiber pills or supplements that contain small amounts of fiber.   The goal is to change the types of food eaten. Do not supplement your present diet with high-fiber foods, but replace foods in your present diet.   INCLUDE A VARIETY OF FIBER SOURCES  Replace refined and processed grains with whole grains, canned fruits with fresh fruits, and incorporate other fiber sources. White rice, white breads, and most bakery goods contain little or no fiber.   Brown whole-grain rice, buckwheat oats, and many fruits and vegetables are all good sources of fiber. These include: broccoli, Brussels sprouts, cabbage, cauliflower, beets, sweet potatoes, white potatoes (skin on), carrots, tomatoes, eggplant, squash, berries, fresh fruits, and dried fruits.   Cereals appear to be the richest source of fiber. Cereal fiber is found in whole grains and bran. Bran is the fiber-rich outer coat of cereal grain, which is largely removed in refining. In whole-grain cereals, the bran remains. In breakfast cereals, the largest amount of fiber is found in those with "bran" in their names. The fiber content is sometimes indicated on the label.   You may need to include additional fruits and vegetables each day.   In baking, for 1 cup white flour, you may  use the following substitutions:   1 cup whole-wheat flour minus 2 tablespoons.   1/2 cup white flour plus 1/2 cup whole-wheat flour.   Polyps, Colon  A polyp is extra tissue that grows inside your body. Colon polyps grow in the large intestine. The large intestine, also called the colon, is part of your digestive system. It is a long, hollow tube at the end of your digestive tract where your body makes and stores stool. Most polyps are not dangerous. They are benign. This means they are not cancerous. But over time, some types of polyps can turn into cancer. Polyps that are smaller than a pea are usually not harmful. But larger polyps could someday become or may already be cancerous. To be safe, doctors remove all polyps and test them.   PREVENTION There is not one sure way to prevent polyps. You might be able to lower your risk of getting them if you:  Eat more fruits and vegetables and less fatty food.   Do not smoke.   Avoid alcohol.  Exercise every day.   Lose weight if you are overweight.   Eating more calcium and folate can also lower your risk of getting polyps. Some foods that are rich in calcium are milk, cheese, and broccoli. Some foods that are rich in folate are chickpeas, kidney beans, and spinach.   Hemorrhoids Hemorrhoids are dilated (enlarged) veins around the rectum. Sometimes clots will form in the veins. This makes them swollen and painful. These are called thrombosed hemorrhoids. Causes of hemorrhoids include:  Constipation.   Straining to have a bowel movement.   HEAVY LIFTING  HOME CARE INSTRUCTIONS  Eat a well balanced diet and drink 6 to 8 glasses of water every day to avoid constipation. You may also use a bulk laxative.   Avoid straining to have bowel movements.   Keep anal area dry and clean.   Do not use a donut shaped pillow or sit on the toilet for long periods. This increases blood pooling and pain.   Move your bowels when your body has  the urge; this will require less straining and will decrease pain and pressure.

## 2015-09-09 ENCOUNTER — Encounter (HOSPITAL_COMMUNITY): Payer: Self-pay | Admitting: Gastroenterology

## 2015-09-09 ENCOUNTER — Telehealth: Payer: Self-pay | Admitting: Gastroenterology

## 2015-09-09 NOTE — Telephone Encounter (Signed)
Reminder in epic °

## 2015-09-09 NOTE — Telephone Encounter (Signed)
Please call pt. She had two simple adenomas and 3 hyperplastic polyps removed.   DRINK WATER TO KEEP YOUR URINE LIGHT YELLOW.  CONTINUE YOUR WEIGHT LOSS EFFORTS. LOSE 10 POUNDS.  YOUR BODY MASS INDEX IS OVER 30 WHICH MEANS YOU ARE OBESE. OBESITY IS ASSOCIATED WITH AN INCREASE FOR ALL CANCERS, INCLUDING ESOPHAGEAL, BREAST, AND COLON CANCER.  FOLLOW A HIGH FIBER DIET. AVOID ITEMS THAT CAUSE BLOATING & GAS.   Next colonoscopy in 5-10 years.

## 2015-09-09 NOTE — Telephone Encounter (Signed)
Pt is aware.  

## 2015-12-08 ENCOUNTER — Ambulatory Visit
Admission: RE | Admit: 2015-12-08 | Discharge: 2015-12-08 | Disposition: A | Discharge status: Home / Self Care | Payer: Medicare Other | Source: Ambulatory Visit | Attending: Obstetrics & Gynecology | Admitting: Obstetrics & Gynecology

## 2015-12-08 DIAGNOSIS — Z853 Personal history of malignant neoplasm of breast: Secondary | ICD-10-CM

## 2016-02-21 ENCOUNTER — Encounter: Payer: Self-pay | Admitting: Cardiovascular Disease

## 2016-02-22 ENCOUNTER — Telehealth: Payer: Self-pay | Admitting: Cardiovascular Disease

## 2016-02-22 NOTE — Telephone Encounter (Signed)
New message  Pt is returning call from [place]

## 2016-02-22 NOTE — Telephone Encounter (Signed)
Attempted to call pt. Unable to leave a message, mailbox is full.

## 2016-02-23 ENCOUNTER — Ambulatory Visit (INDEPENDENT_AMBULATORY_CARE_PROVIDER_SITE_OTHER): Payer: Medicare Other | Admitting: Cardiovascular Disease

## 2016-02-23 ENCOUNTER — Encounter: Payer: Self-pay | Admitting: Cardiovascular Disease

## 2016-02-23 VITALS — BP 170/90 | HR 64 | Ht 64.0 in | Wt 217.8 lb

## 2016-02-23 DIAGNOSIS — E785 Hyperlipidemia, unspecified: Secondary | ICD-10-CM

## 2016-02-23 DIAGNOSIS — I6523 Occlusion and stenosis of bilateral carotid arteries: Secondary | ICD-10-CM

## 2016-02-23 DIAGNOSIS — I10 Essential (primary) hypertension: Secondary | ICD-10-CM

## 2016-02-23 MED ORDER — LABETALOL HCL 200 MG PO TABS: 200.0000 mg | ORAL_TABLET | Freq: Two times a day (BID) | ORAL | 3 refills | Status: DC

## 2016-02-23 MED ORDER — LABETALOL HCL 200 MG PO TABS
200.0000 mg | ORAL_TABLET | Freq: Two times a day (BID) | ORAL | 3 refills | Status: DC
Start: 2016-02-23 — End: 2016-02-23

## 2016-02-23 MED ORDER — VARENICLINE TARTRATE 1 MG PO TABS: 1.0000 mg | ORAL_TABLET | Freq: Two times a day (BID) | ORAL | 2 refills | Status: DC

## 2016-02-23 MED ORDER — ATORVASTATIN CALCIUM 20 MG PO TABS: 20.0000 mg | ORAL_TABLET | Freq: Every day | ORAL | 3 refills | Status: DC

## 2016-02-23 MED ORDER — LABETALOL HCL 200 MG PO TABS
200.0000 mg | ORAL_TABLET | Freq: Two times a day (BID) | ORAL | 3 refills | Status: DC
Start: 2016-02-23 — End: 2017-05-23

## 2016-02-23 MED ORDER — VARENICLINE TARTRATE 0.5 MG X 11 & 1 MG X 42 PO MISC: ORAL | 0 refills | Status: DC

## 2016-02-23 NOTE — Progress Notes (Signed)
Cardiology Office Note Date:  02/23/2016   ID:  [name], Mole [birth year], MRN [id number]  PCP:  Binnie Rail, MD  Cardiologist:  Sherren Mocha, MD    Chief Complaint  Patient presents with  . Chest Pain   History of Present Illness: [name] is a 70 y.o. female who presents for follow-up of asymptomatic carotid stenosis, hypertension, and hyperlipidemia.  The patient is doing okay. She has run out of her cholesterol medicine and also her labetalol. She continues to smoke cigarettes but wants to quit. She denies chest pain, chest pressure, shortness of breath, leg swelling, or heart palpitations.    Past Medical History:  Diagnosis Date  . Absence of menstruation    amenorrhea  . Angioneurotic edema not elsewhere classified    due to ACE-I  . Breast disorder    breast cancer 2007  . Cancer [facility]) 2007   Left Breast Cancer  . Carotid stenosis    Carotid US (8/15):  RICA 123456; LICA 123456 >>> FU 1 year  . H/O: rheumatic fever   . History of breast cancer 07/13/2015  . HLD (hyperlipidemia)   . Occlusion and stenosis of carotid artery without mention of cerebral infarction   . Other abnormal glucose    fasting hyperglycemia  . Personal history of malignant neoplasm of breast   . Unspecified essential hypertension     Past Surgical History:  Procedure Laterality Date  . APPENDECTOMY    . BREAST LUMPECTOMY    . COLONOSCOPY  2005   negative; Dr Collene Mares  . COLONOSCOPY N/A 09/02/2015   Procedure: COLONOSCOPY;  Surgeon: Danie Binder, MD;  [facility];  Service: Endoscopy;  Laterality: N/A;  1215-moved to 1130 Ginger to notify pt  . G0 P0     Dr. Maudry Diego    Current Outpatient Prescriptions  Medication Sig Dispense Refill  . amLODipine (NORVASC) 10 MG tablet Take 1 tablet (10 mg total) by mouth daily. 90 tablet 3  . labetalol (NORMODYNE) 200 MG tablet Take 1 tablet (200 mg total) by mouth 2 (two) times daily. 180 tablet 3  . omeprazole (PRILOSEC) 40 MG  capsule TAKE ONE CAPSULE BY MOUTH ONCE DAILY. 90 capsule 3  . traMADol (ULTRAM) 50 MG tablet Take 1 tablet (50 mg total) by mouth every 8 (eight) hours as needed. (Patient taking differently: Take 50 mg by mouth every 8 (eight) hours as needed for moderate pain. ) 30 tablet 0  . atorvastatin (LIPITOR) 20 MG tablet Take 1 tablet (20 mg total) by mouth daily. 90 tablet 3  . varenicline ([name] CONTINUING MONTH PAK) 1 MG tablet Take 1 tablet (1 mg total) by mouth 2 (two) times daily. 60 tablet 2  . varenicline ([name] STARTING MONTH PAK) 0.5 MG X 11 & 1 MG X 42 tablet Take one 0.5 mg tablet by mouth once daily for 3 days, then increase to one 0.5 mg tablet twice daily for 4 days, then increase to one 1 mg tablet twice daily. 53 tablet 0   No current facility-administered medications for this visit.     Allergies:   Benazepril hcl; Peanut-containing drug products; Shellfish allergy; and Yellow dyes (non-tartrazine)   Social History:  The patient  reports that she has been smoking Cigarettes.  She has a 24.00 pack-year smoking history. She has never used smokeless tobacco. She reports that she does not drink alcohol or use drugs.   Family History:  The patient's  family history includes Breast cancer  in her sister.    ROS:  Please see the history of present illness.  All other systems are reviewed and negative.    PHYSICAL EXAM: VS:  BP (!) 170/90 (BP Location: Right Arm, Patient Position: Sitting, Cuff Size: Large)   Pulse 64   Ht 5\' 4"  (1.626 m)   Wt 217 lb 12.8 oz (98.8 kg)   BMI 37.39 kg/m  , BMI Body mass index is 37.39 kg/m. GEN: Well nourished, well developed, overweight woman in no acute distress  HEENT: normal  Neck: no JVD, no masses. No carotid bruits Cardiac: RRR without murmur or gallop                Respiratory:  clear to auscultation bilaterally, normal work of breathing GI: soft, nontender, nondistended, + BS MS: no deformity or atrophy  Ext: no pretibial edema, pedal  pulses 2+= bilaterally Skin: warm and dry, no rash Neuro:  Strength and sensation are intact Psych: euthymic mood, full affect  EKG:  EKG is ordered today. The ekg ordered today shows NSR 64 bpm, within normal limits  Recent Labs: 03/09/2015: TSH 1.43 07/20/2015: ALT 8; BUN 16; Creatinine, Ser 0.86; Potassium 4.3; Sodium 139   Lipid Panel     Component Value Date/Time   CHOL 216 (H) 07/20/2015 1006   TRIG 148.0 07/20/2015 1006   HDL 45.90 07/20/2015 1006   CHOLHDL 5 07/20/2015 1006   VLDL 29.6 07/20/2015 1006   LDLCALC 140 (H) 07/20/2015 1006   LDLDIRECT 141.8 07/31/2012 1537      Wt Readings from Last 3 Encounters:  02/23/16 217 lb 12.8 oz (98.8 kg)  09/02/15 217 lb (98.4 kg)  07/20/15 217 lb (98.4 kg)     ASSESSMENT AND PLAN: 1.  Carotid stenosis, asymptomatic: last carotid duplex reviewed 12-03-2014. 40-59% bilateral ICA stenosis. Will repeat duplex. Continue ASA/restart statin/discussed tobacco cessation.  2. Hyperlipidemia: last lipids with LDL cholesterol well above goal. I think she has been off of atorvastatin for some time. We'll start her back on her previous dose and repeat lipids and LFTs in 3 months.   3. Hypertension: importance of medication compliance reviewed. Resume labetalol. Continue amlodipine. Work on lifestyle modification.   4. Tobacco: motivated to quit. Counseled today. Interested in [name] - reviewed pros and cons, potential side effects including cardiovascular risk and depression. Benefit clearly outweighs risks and will write Rx for her.   Current medicines are reviewed with the patient today.  The patient does not have concerns regarding medicines.  Labs/ tests ordered today include:   Orders Placed This Encounter  Procedures  . Lipid panel  . Hepatic function panel  . EKG 12-Lead    Disposition:   FU one year  Signed, Sherren Mocha, MD  02/23/2016 1:56 PM    [facility] [place], [place], [place]   [address] Phone: [phone]; Fax: [phone]

## 2016-02-23 NOTE — Addendum Note (Signed)
Addended by: Barkley Boards on: 02/23/2016 02:05 PM   Modules accepted: Orders

## 2016-02-23 NOTE — Patient Instructions (Addendum)
Medication Instructions:  Your physician has recommended you make the following change in your medication:  1. RESTART Atorvastatin 20mg  take one tablet by mouth every evening 2. Prescription sent in for Labetalol 3. START [name]   Labwork: Your physician recommends that you return for a FASTING LIPID and LIVER in 3 MONTHS--nothing to eat or drink after midnight, lab opens at 7:30 AM  Testing/Procedures: Your physician has requested that you have a carotid duplex. This test is an ultrasound of the carotid arteries in your neck. It looks at blood flow through these arteries that supply the brain with blood. Allow one hour for this exam. There are no restrictions or special instructions.  Follow-Up: Your physician wants you to follow-up in: 1 YEAR with Dr Burt Knack.  You will receive a reminder letter in the mail two months in advance. If you don't receive a letter, please call our office to schedule the follow-up appointment.   Any Other Special Instructions Will Be Listed Below (If Applicable).     If you need a refill on your cardiac medications before your next appointment, please call your pharmacy.

## 2016-02-23 NOTE — Telephone Encounter (Signed)
[name] had contacted the pt in regards to follow-up appointment.  This has been rescheduled to today.

## 2016-02-27 ENCOUNTER — Ambulatory Visit: Payer: Medicare Other | Admitting: Cardiovascular Disease

## 2016-03-15 ENCOUNTER — Inpatient Hospital Stay (HOSPITAL_COMMUNITY): Admission: RE | Admit: 2016-03-15 | Payer: Medicare Other | Source: Ambulatory Visit

## 2016-03-15 ENCOUNTER — Other Ambulatory Visit: Payer: Self-pay | Admitting: Cardiovascular Disease

## 2016-03-15 DIAGNOSIS — I6523 Occlusion and stenosis of bilateral carotid arteries: Secondary | ICD-10-CM

## 2016-04-11 ENCOUNTER — Ambulatory Visit (HOSPITAL_COMMUNITY)
Admission: RE | Admit: 2016-04-11 | Discharge: 2016-04-11 | Disposition: A | Discharge status: Home / Self Care | Payer: Medicare HMO | Source: Ambulatory Visit | Attending: Cardiology | Admitting: Cardiology

## 2016-04-11 DIAGNOSIS — E119 Type 2 diabetes mellitus without complications: Secondary | ICD-10-CM | POA: Diagnosis not present

## 2016-04-11 DIAGNOSIS — I10 Essential (primary) hypertension: Secondary | ICD-10-CM | POA: Diagnosis not present

## 2016-04-11 DIAGNOSIS — Z720 Tobacco use: Secondary | ICD-10-CM | POA: Diagnosis not present

## 2016-04-11 DIAGNOSIS — E785 Hyperlipidemia, unspecified: Secondary | ICD-10-CM | POA: Diagnosis not present

## 2016-04-11 DIAGNOSIS — I6523 Occlusion and stenosis of bilateral carotid arteries: Secondary | ICD-10-CM

## 2016-05-18 ENCOUNTER — Other Ambulatory Visit: Payer: Medicare HMO | Admitting: *Deleted

## 2016-05-18 DIAGNOSIS — E785 Hyperlipidemia, unspecified: Secondary | ICD-10-CM | POA: Diagnosis not present

## 2016-05-18 DIAGNOSIS — I6523 Occlusion and stenosis of bilateral carotid arteries: Secondary | ICD-10-CM | POA: Diagnosis not present

## 2016-05-18 DIAGNOSIS — I10 Essential (primary) hypertension: Secondary | ICD-10-CM | POA: Diagnosis not present

## 2016-05-18 LAB — LIPID PANEL
Chol/HDL Ratio: 3.4 ratio units (ref 0.0–4.4)
Cholesterol, Total: 168 mg/dL (ref 100–199)
HDL: 49 mg/dL (ref >39)
LDL Calculated: 100 mg/dL — ABNORMAL HIGH (ref 0–99)
Triglycerides: 94 mg/dL (ref 0–149)
VLDL CHOLESTEROL CAL: 19 mg/dL (ref 5–40)

## 2016-05-18 LAB — HEPATIC FUNCTION PANEL
ALBUMIN: 4.1 g/dL (ref 3.6–4.8)
ALK PHOS: 102 IU/L (ref 39–117)
ALT: 9 IU/L (ref 0–32)
AST: 10 IU/L (ref 0–40)
BILIRUBIN, DIRECT: 0.12 mg/dL (ref 0.00–0.40)
Bilirubin Total: 0.5 mg/dL (ref 0.0–1.2)
TOTAL PROTEIN: 7.3 g/dL (ref 6.0–8.5)

## 2016-05-18 NOTE — Addendum Note (Signed)
Addended by: Eulis Foster on: 05/18/2016 08:44 AM   Modules accepted: Orders

## 2016-06-29 ENCOUNTER — Encounter: Payer: Self-pay | Admitting: Cardiovascular Disease

## 2016-06-29 ENCOUNTER — Encounter: Payer: Self-pay | Admitting: Internal Medicine

## 2016-07-31 ENCOUNTER — Encounter: Payer: Self-pay | Admitting: Internal Medicine

## 2016-07-31 ENCOUNTER — Ambulatory Visit (INDEPENDENT_AMBULATORY_CARE_PROVIDER_SITE_OTHER): Payer: Medicare HMO | Admitting: Internal Medicine

## 2016-07-31 VITALS — BP 130/80 | HR 62 | Temp 98.1°F | Ht 64.0 in | Wt 216.2 lb

## 2016-07-31 DIAGNOSIS — J069 Acute upper respiratory infection, unspecified: Secondary | ICD-10-CM | POA: Diagnosis not present

## 2016-07-31 MED ORDER — HYDROCODONE-HOMATROPINE 5-1.5 MG/5ML PO SYRP: 5.0000 mL | ORAL_SOLUTION | Freq: Three times a day (TID) | ORAL | 0 refills | Status: DC | PRN

## 2016-07-31 NOTE — Progress Notes (Signed)
Subjective:    Patient ID: [name], female    DOB: 19-May-1945, 71 y.o.   MRN: [id number]  HPI She is here for an acute visit for cold symptoms.   Her symptoms started about one week ago.    She is experiencing a lot of mucus in her throat that is worse at night.  She has been cough, heard wheezing at night, mild SOB, subjective fever at one point, PND, mild nasal congestion.    Her manager was sick recently and she thinks that is who she got sick from.   She has tried cough medicine, cough drops with some improvement in symptoms.  She has had pneumonia twice.    Medications and allergies reviewed with patient and updated if appropriate.  Patient Active Problem List   Diagnosis Date Noted  . Special screening for malignant neoplasms, colon   . Prediabetes 07/24/2015  . GERD (gastroesophageal reflux disease) 07/20/2015  . History of breast cancer 07/13/2015  . Vitiligo 07/07/2012  . PLANTAR FASCIITIS 06/16/2010  . CIGARETTE SMOKER 06/14/2009  . Hyperlipidemia 11/10/2008  . Hyperglycemia 11/10/2008  . ANGIONEUROTIC EDEMA 01/09/2008  . CAROTID ARTERY STENOSIS, BILATERAL 06/17/2007  . Essential hypertension 06/12/2007  . Personal history of malignant neoplasm of breast 02/25/2007  . RHEUMATIC FEVER, HX OF 02/21/2007    Current Outpatient Prescriptions on File Prior to Visit  Medication Sig Dispense Refill  . amLODipine (NORVASC) 10 MG tablet Take 1 tablet (10 mg total) by mouth daily. 90 tablet 3  . labetalol (NORMODYNE) 200 MG tablet Take 1 tablet (200 mg total) by mouth 2 (two) times daily. 180 tablet 3  . omeprazole (PRILOSEC) 40 MG capsule TAKE ONE CAPSULE BY MOUTH ONCE DAILY. 90 capsule 3  . traMADol (ULTRAM) 50 MG tablet Take 1 tablet (50 mg total) by mouth every 8 (eight) hours as needed. (Patient taking differently: Take 50 mg by mouth every 8 (eight) hours as needed for moderate pain. ) 30 tablet 0  . atorvastatin (LIPITOR) 20 MG tablet Take 1 tablet (20 mg  total) by mouth daily. 90 tablet 3   No current facility-administered medications on file prior to visit.     Past Medical History:  Diagnosis Date  . Absence of menstruation    amenorrhea  . Angioneurotic edema not elsewhere classified    due to ACE-I  . Breast disorder    breast cancer 2007  . Cancer [facility]) 2007   Left Breast Cancer  . Carotid stenosis    Carotid US (8/15):  RICA 70-26%; LICA 37-85% >>> FU 1 year  . H/O: rheumatic fever   . History of breast cancer 07/13/2015  . HLD (hyperlipidemia)   . Occlusion and stenosis of carotid artery without mention of cerebral infarction   . Other abnormal glucose    fasting hyperglycemia  . Personal history of malignant neoplasm of breast   . Unspecified essential hypertension     Past Surgical History:  Procedure Laterality Date  . APPENDECTOMY    . BREAST LUMPECTOMY    . COLONOSCOPY  2005   negative; Dr Collene Mares  . COLONOSCOPY N/A 09/02/2015   Procedure: COLONOSCOPY;  Surgeon: Danie Binder, MD;  Location[facility];  Service: Endoscopy;  Laterality: N/A;  1215-moved to 1130 Ginger to notify pt  . G0 P0     Dr. Maudry Diego    Social History   Social History  . Marital status: Single    Spouse name: N/A  . Number of  children: N/A  . Years of education: N/A   Social History Main Topics  . Smoking status: Current Some Day Smoker    Packs/day: 0.50    Years: 48.00    Types: Cigarettes  . Smokeless tobacco: Never Used  . Alcohol use No  . Drug use: No  . Sexual activity: Yes    Birth control/ protection: Post-menopausal   Other Topics Concern  . Not on file   Social History Narrative   Single, no children.   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    exercises regularly - walks 2 mpd weather permtting    Family History  Problem Relation Age of Onset  . Hypertension      some of Pts brothers and sisters  . Heart attack Neg Hx   . Stroke Neg Hx   . Diabetes Neg Hx   . Heart disease Neg Hx   . Breast  cancer Sister     Review of Systems  Constitutional: Positive for chills and diaphoresis. Negative for fever.  HENT: Positive for congestion (mild) and postnasal drip. Negative for ear pain, rhinorrhea, sinus pain, sinus pressure and sore throat.   Respiratory: Positive for cough, shortness of breath (mild) and wheezing (at night).   Cardiovascular: Negative for chest pain.  Gastrointestinal: Positive for diarrhea (resolved). Negative for nausea.  Neurological: Negative for light-headedness and headaches.       Objective:   Vitals:   07/31/16 1331  BP: 130/80  Pulse: 62  Temp: 98.1 F (36.7 C)   Filed Weights   07/31/16 1331  Weight: 216 lb 3.2 oz (98.1 kg)   Body mass index is 37.11 kg/m.  Wt Readings from Last 3 Encounters:  07/31/16 216 lb 3.2 oz (98.1 kg)  02/23/16 217 lb 12.8 oz (98.8 kg)  09/02/15 217 lb (98.4 kg)     Physical Exam GENERAL APPEARANCE: Appears stated age, well appearing, NAD EYES: conjunctiva clear, no icterus HEENT: bilateral tympanic membranes and ear canals normal, oropharynx with mild erythema, no thyromegaly, trachea midline, no cervical or supraclavicular lymphadenopathy LUNGS: Clear to auscultation without wheeze or crackles, unlabored breathing, good air entry bilaterally HEART: Normal S1,S2 without murmurs EXTREMITIES: Without clubbing, cyanosis, or edema        Assessment & Plan:   See Problem List for Assessment and Plan of chronic medical problems.

## 2016-07-31 NOTE — Patient Instructions (Signed)
Use the cough syrup at night if needed.  You can try mucinex, allergy medication (flonase or claritin).    Rest and fluids are the most important.   Call if no improvement   Stop smoking!!!!!!   Follow up with me for your routine medical problems

## 2016-07-31 NOTE — Assessment & Plan Note (Signed)
Viral - no need for antibiotics Hycodan cough syrup mucinex, claritin, coricdan cold products Stop smoking Rest,fluids Call if no improvement

## 2016-08-07 DIAGNOSIS — H43391 Other vitreous opacities, right eye: Secondary | ICD-10-CM | POA: Diagnosis not present

## 2016-08-07 DIAGNOSIS — H43811 Vitreous degeneration, right eye: Secondary | ICD-10-CM | POA: Diagnosis not present

## 2016-08-13 ENCOUNTER — Encounter: Payer: Self-pay | Admitting: Internal Medicine

## 2016-08-13 ENCOUNTER — Ambulatory Visit (INDEPENDENT_AMBULATORY_CARE_PROVIDER_SITE_OTHER): Payer: Medicare HMO | Admitting: Internal Medicine

## 2016-08-13 VITALS — BP 164/82 | HR 71 | Temp 98.4°F | Resp 16 | Wt 217.0 lb

## 2016-08-13 DIAGNOSIS — E010 Iodine-deficiency related diffuse (endemic) goiter: Secondary | ICD-10-CM

## 2016-08-13 DIAGNOSIS — M542 Cervicalgia: Secondary | ICD-10-CM

## 2016-08-13 NOTE — Progress Notes (Signed)
Pre visit review using our clinic review tool, if applicable. No additional management support is needed unless otherwise documented below in the visit note. 

## 2016-08-13 NOTE — Progress Notes (Signed)
Subjective:    Patient ID: [name], female    DOB: 03/17/1946, 71 y.o.   MRN: [id number]  HPI She is here for an acute visit.   She has a lump on the inside of her throat and can feel it when she swallows.  It is irritating/sore.  She denies pain.  She took ibuprofen and it helped.  She has difficulty sleeping due to feeling like her throat is closing in on her. She cough and brings up some yellow phlegm which is all day and night.    She denies GERD,SOB, wheeze.  She may have some PND.  She has been taking mucinex and it has helped.    She is still smoking.    Medications and allergies reviewed with patient and updated if appropriate.  Patient Active Problem List   Diagnosis Date Noted  . Viral upper respiratory tract infection 07/31/2016  . Special screening for malignant neoplasms, colon   . Prediabetes 07/24/2015  . GERD (gastroesophageal reflux disease) 07/20/2015  . History of breast cancer 07/13/2015  . Vitiligo 07/07/2012  . PLANTAR FASCIITIS 06/16/2010  . CIGARETTE SMOKER 06/14/2009  . Hyperlipidemia 11/10/2008  . Hyperglycemia 11/10/2008  . ANGIONEUROTIC EDEMA 01/09/2008  . CAROTID ARTERY STENOSIS, BILATERAL 06/17/2007  . Essential hypertension 06/12/2007  . Personal history of malignant neoplasm of breast 02/25/2007  . RHEUMATIC FEVER, HX OF 02/21/2007    Current Outpatient Prescriptions on File Prior to Visit  Medication Sig Dispense Refill  . amLODipine (NORVASC) 10 MG tablet Take 1 tablet (10 mg total) by mouth daily. 90 tablet 3  . HYDROcodone-homatropine (HYCODAN) 5-1.5 MG/5ML syrup Take 5 mLs by mouth every 8 (eight) hours as needed for cough. 180 mL 0  . labetalol (NORMODYNE) 200 MG tablet Take 1 tablet (200 mg total) by mouth 2 (two) times daily. 180 tablet 3  . omeprazole (PRILOSEC) 40 MG capsule TAKE ONE CAPSULE BY MOUTH ONCE DAILY. 90 capsule 3  . traMADol (ULTRAM) 50 MG tablet Take 1 tablet (50 mg total) by mouth every 8 (eight) hours as needed.  (Patient taking differently: Take 50 mg by mouth every 8 (eight) hours as needed for moderate pain. ) 30 tablet 0  . atorvastatin (LIPITOR) 20 MG tablet Take 1 tablet (20 mg total) by mouth daily. 90 tablet 3   No current facility-administered medications on file prior to visit.     Past Medical History:  Diagnosis Date  . Absence of menstruation    amenorrhea  . Angioneurotic edema not elsewhere classified    due to ACE-I  . Breast disorder    breast cancer 2007  . Cancer [facility]) 2007   Left Breast Cancer  . Carotid stenosis    Carotid US (8/15):  RICA 50-53%; LICA 97-67% >>> FU 1 year  . H/O: rheumatic fever   . History of breast cancer 07/13/2015  . HLD (hyperlipidemia)   . Occlusion and stenosis of carotid artery without mention of cerebral infarction   . Other abnormal glucose    fasting hyperglycemia  . Personal history of malignant neoplasm of breast   . Unspecified essential hypertension     Past Surgical History:  Procedure Laterality Date  . APPENDECTOMY    . BREAST LUMPECTOMY    . COLONOSCOPY  2005   negative; Dr Collene Mares  . COLONOSCOPY N/A 09/02/2015   Procedure: COLONOSCOPY;  Surgeon: Danie Binder, MD;  [facility];  Service: Endoscopy;  Laterality: N/A;  1215-moved to 1130 Ginger  to notify pt  . G0 P0     Dr. Maudry Diego    Social History   Social History  . Marital status: Single    Spouse name: N/A  . Number of children: N/A  . Years of education: N/A   Social History Main Topics  . Smoking status: Current Some Day Smoker    Packs/day: 0.50    Years: 48.00    Types: Cigarettes  . Smokeless tobacco: Never Used  . Alcohol use No  . Drug use: No  . Sexual activity: Yes    Birth control/ protection: Post-menopausal   Other Topics Concern  . Not on file   Social History Narrative   Single, no children.   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    exercises regularly - walks 2 mpd weather permtting    Family History  Problem Relation  Age of Onset  . Hypertension      some of Pts brothers and sisters  . Heart attack Neg Hx   . Stroke Neg Hx   . Diabetes Neg Hx   . Heart disease Neg Hx   . Breast cancer Sister     Review of Systems  Constitutional: Negative for chills and fever.  HENT: Positive for postnasal drip and sore throat. Negative for congestion, ear pain (left ear pressure), sinus pain, sinus pressure and trouble swallowing.   Respiratory: Positive for cough (brings up yellow phlegm). Negative for shortness of breath and wheezing.   Gastrointestinal:       No GERD  Neurological: Negative for dizziness, light-headedness and headaches.       Objective:   Vitals:   08/13/16 1529  BP: (!) 164/82  Pulse: 71  Resp: 16  Temp: 98.4 F (36.9 C)   Filed Weights   08/13/16 1529  Weight: 217 lb (98.4 kg)   Body mass index is 37.25 kg/m.  Wt Readings from Last 3 Encounters:  08/13/16 217 lb (98.4 kg)  07/31/16 216 lb 3.2 oz (98.1 kg)  02/23/16 217 lb 12.8 oz (98.8 kg)     Physical Exam GENERAL APPEARANCE: Appears stated age, well appearing, NAD EYES: conjunctiva clear, no icterus HEENT: bilateral tympanic membranes normal, right ear canal normal, left ear canal with minimal blood - no active bleeding.   oropharynx with no erythema, thyromegaly, trachea midline, no cervical or supraclavicular lymphadenopathy LUNGS: Clear to auscultation without wheeze or crackles, unlabored breathing, good air entry bilaterally HEART: Normal S1,S2 without murmurs EXTREMITIES: Without clubbing, cyanosis, or edema        Assessment & Plan:

## 2016-08-13 NOTE — Assessment & Plan Note (Signed)
Discomfort/soreness - no pain in left anterior neck with swallowing - very bothersome at night Stressed smoking cessation r/o thyromegaly, neck lump/mass, PND - can try flonase, GERD

## 2016-08-13 NOTE — Patient Instructions (Signed)
  Medications reviewed and updated.  No changes recommended at this time.  An ultrasound of your thyroid was ordered.   A referral was ordered for ENT was ordered.

## 2016-08-13 NOTE — Assessment & Plan Note (Signed)
?   Mild thyromegaly on exam  Will check [place], especially given discomfort with swallowing

## 2016-08-23 ENCOUNTER — Ambulatory Visit
Admission: RE | Admit: 2016-08-23 | Discharge: 2016-08-23 | Disposition: A | Discharge status: Home / Self Care | Payer: Medicare HMO | Source: Ambulatory Visit | Attending: Internal Medicine | Admitting: Internal Medicine

## 2016-08-23 DIAGNOSIS — E010 Iodine-deficiency related diffuse (endemic) goiter: Secondary | ICD-10-CM

## 2016-08-23 DIAGNOSIS — R1310 Dysphagia, unspecified: Secondary | ICD-10-CM | POA: Diagnosis not present

## 2016-10-02 DIAGNOSIS — H2513 Age-related nuclear cataract, bilateral: Secondary | ICD-10-CM | POA: Diagnosis not present

## 2016-10-02 DIAGNOSIS — H25013 Cortical age-related cataract, bilateral: Secondary | ICD-10-CM | POA: Diagnosis not present

## 2016-10-02 DIAGNOSIS — H35033 Hypertensive retinopathy, bilateral: Secondary | ICD-10-CM | POA: Diagnosis not present

## 2016-10-02 DIAGNOSIS — H43391 Other vitreous opacities, right eye: Secondary | ICD-10-CM | POA: Diagnosis not present

## 2016-10-02 DIAGNOSIS — H43811 Vitreous degeneration, right eye: Secondary | ICD-10-CM | POA: Diagnosis not present

## 2016-10-02 DIAGNOSIS — H35363 Drusen (degenerative) of macula, bilateral: Secondary | ICD-10-CM | POA: Diagnosis not present

## 2016-10-23 ENCOUNTER — Other Ambulatory Visit: Payer: Self-pay | Admitting: Obstetrics and Gynecology

## 2016-10-23 ENCOUNTER — Other Ambulatory Visit: Payer: Self-pay | Admitting: Internal Medicine

## 2016-10-23 DIAGNOSIS — R921 Mammographic calcification found on diagnostic imaging of breast: Secondary | ICD-10-CM

## 2016-11-28 IMAGING — CR DG FOOT COMPLETE 3+V*L*
3 series · 3 of 3 positions shown · non-contrast
Comparison: None.

CLINICAL DATA: Trauma.  Fourth and fifth digit pain.

EXAM:
LEFT FOOT - COMPLETE 3+ VIEW

[view not recorded (1 of 3)]
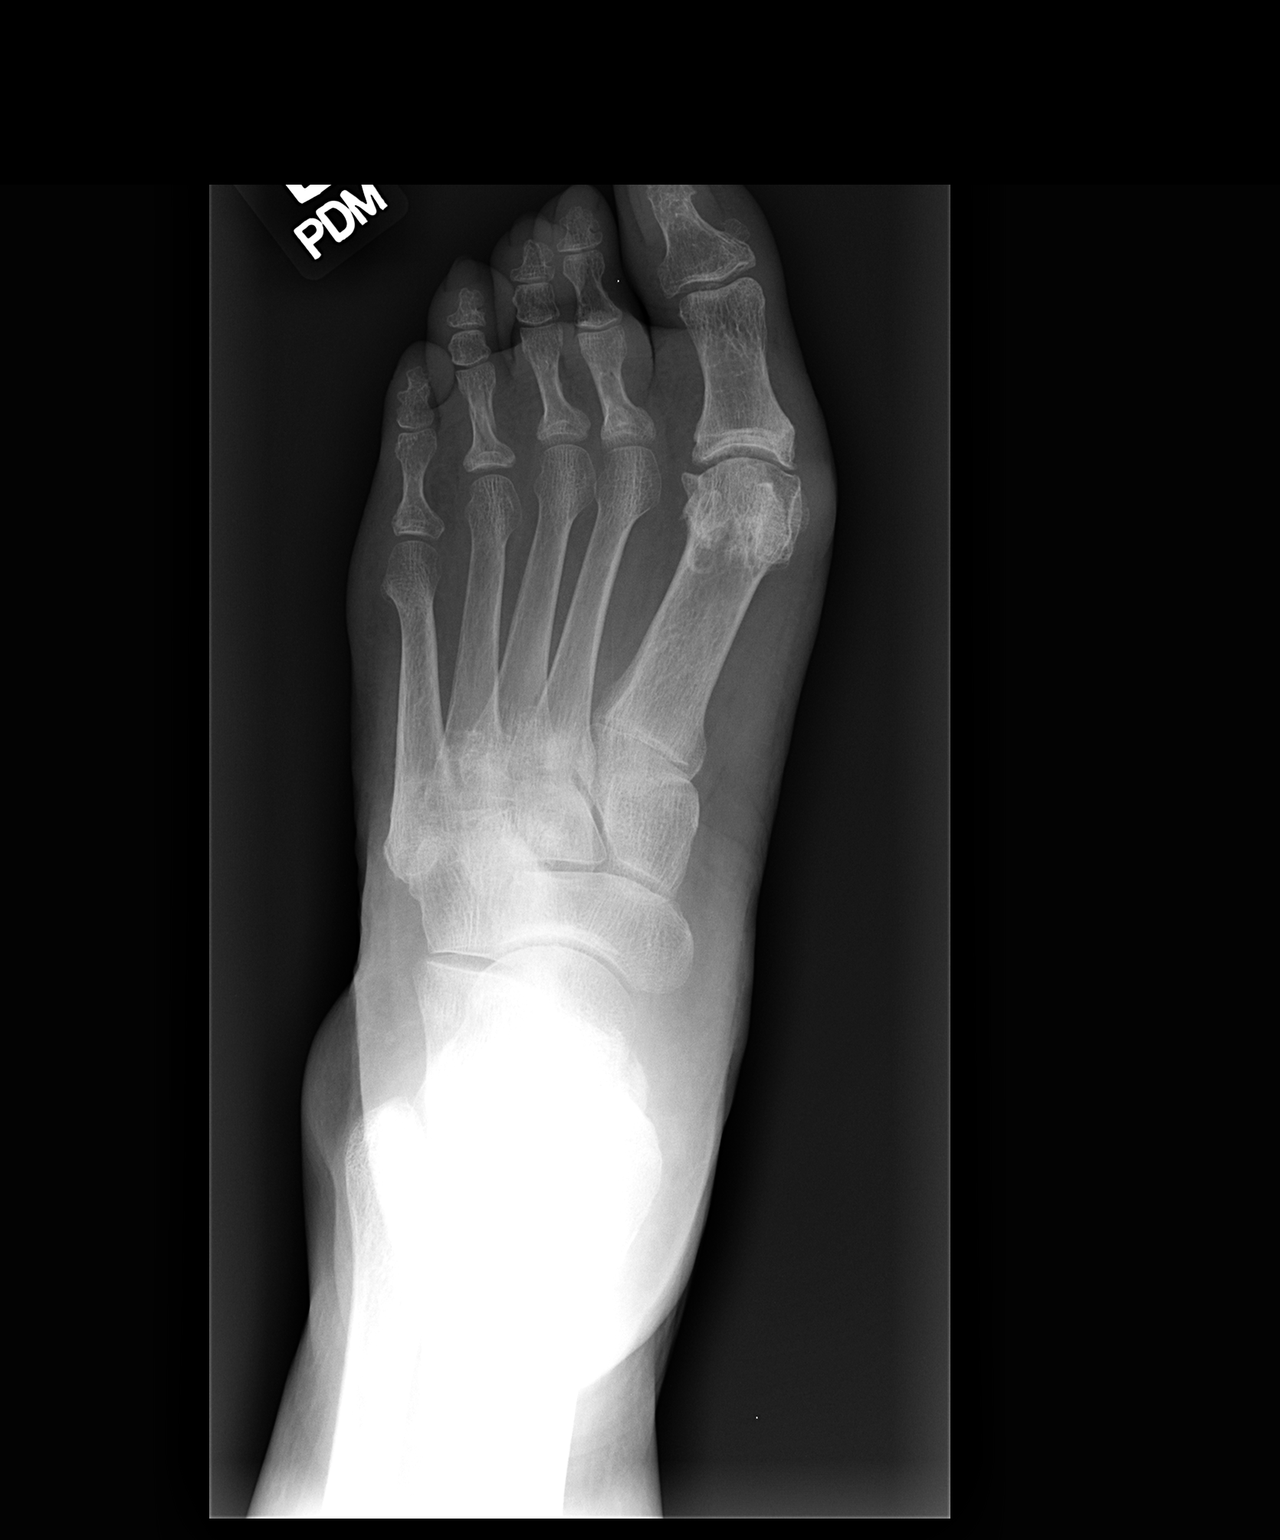

[view not recorded (2 of 3)]
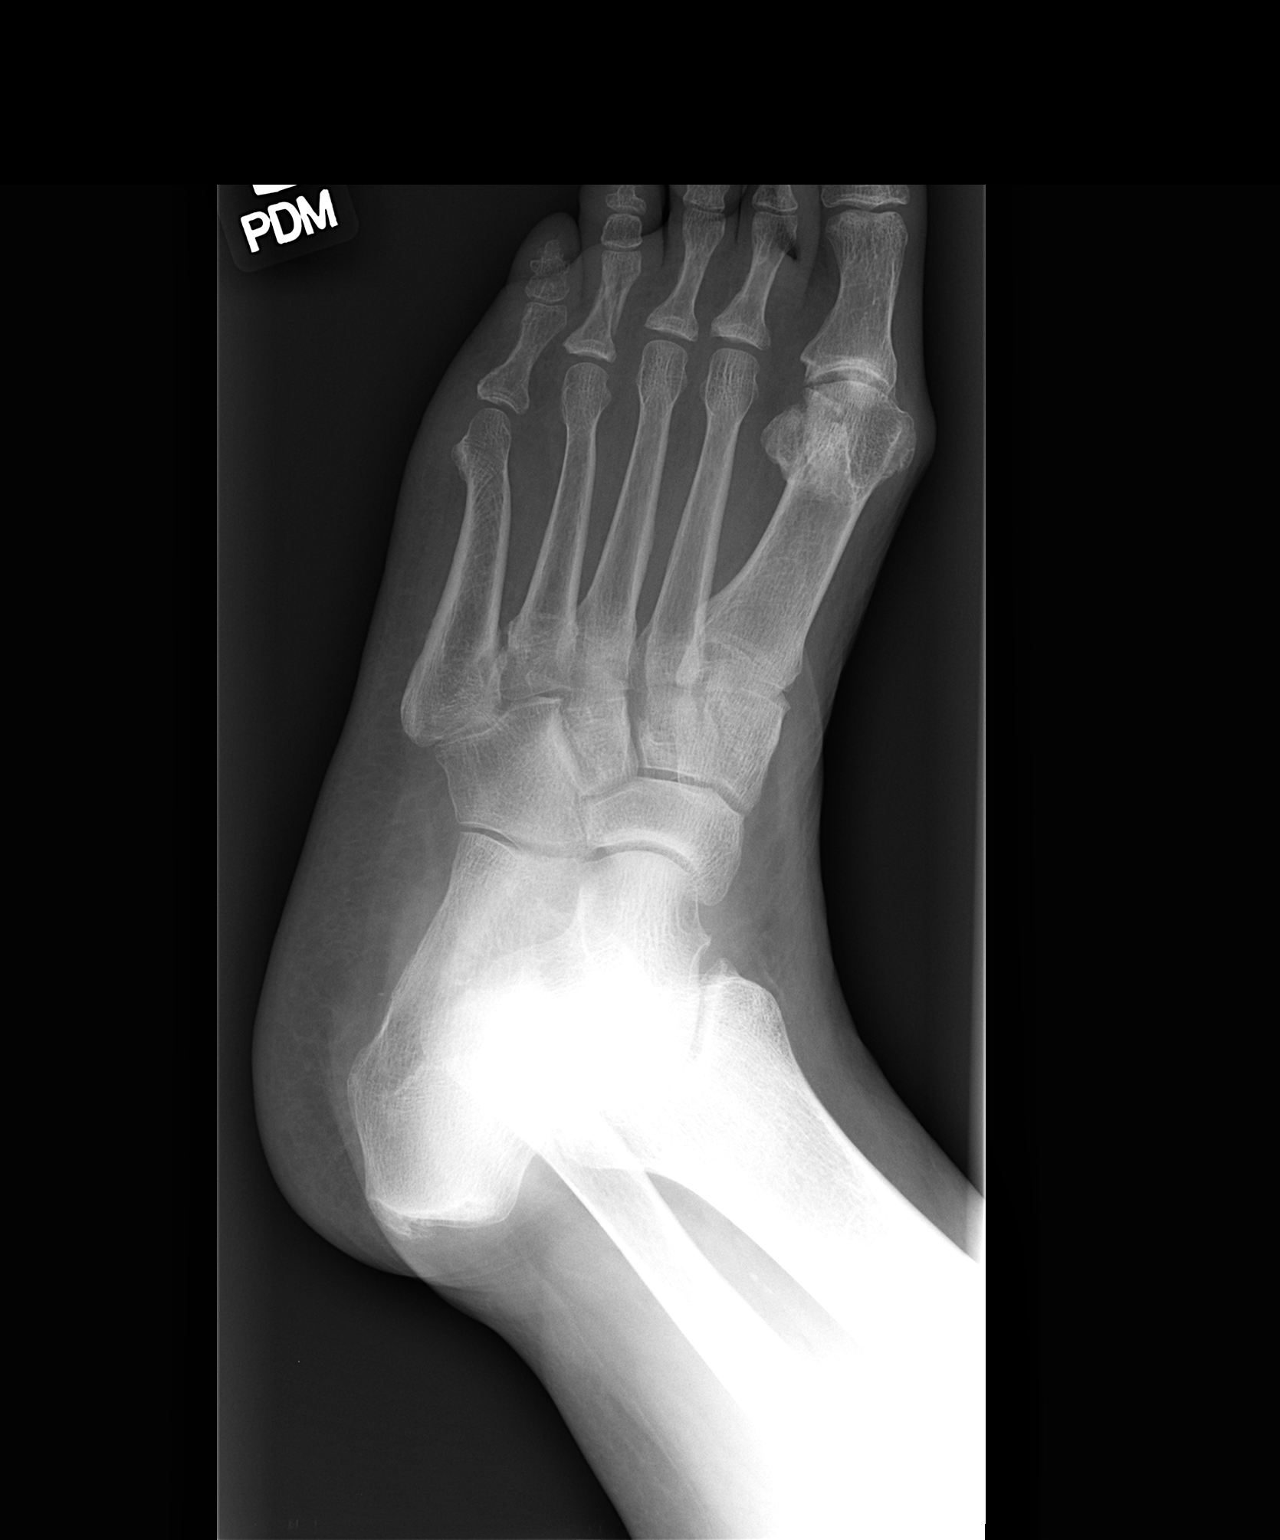

[view not recorded (3 of 3)]
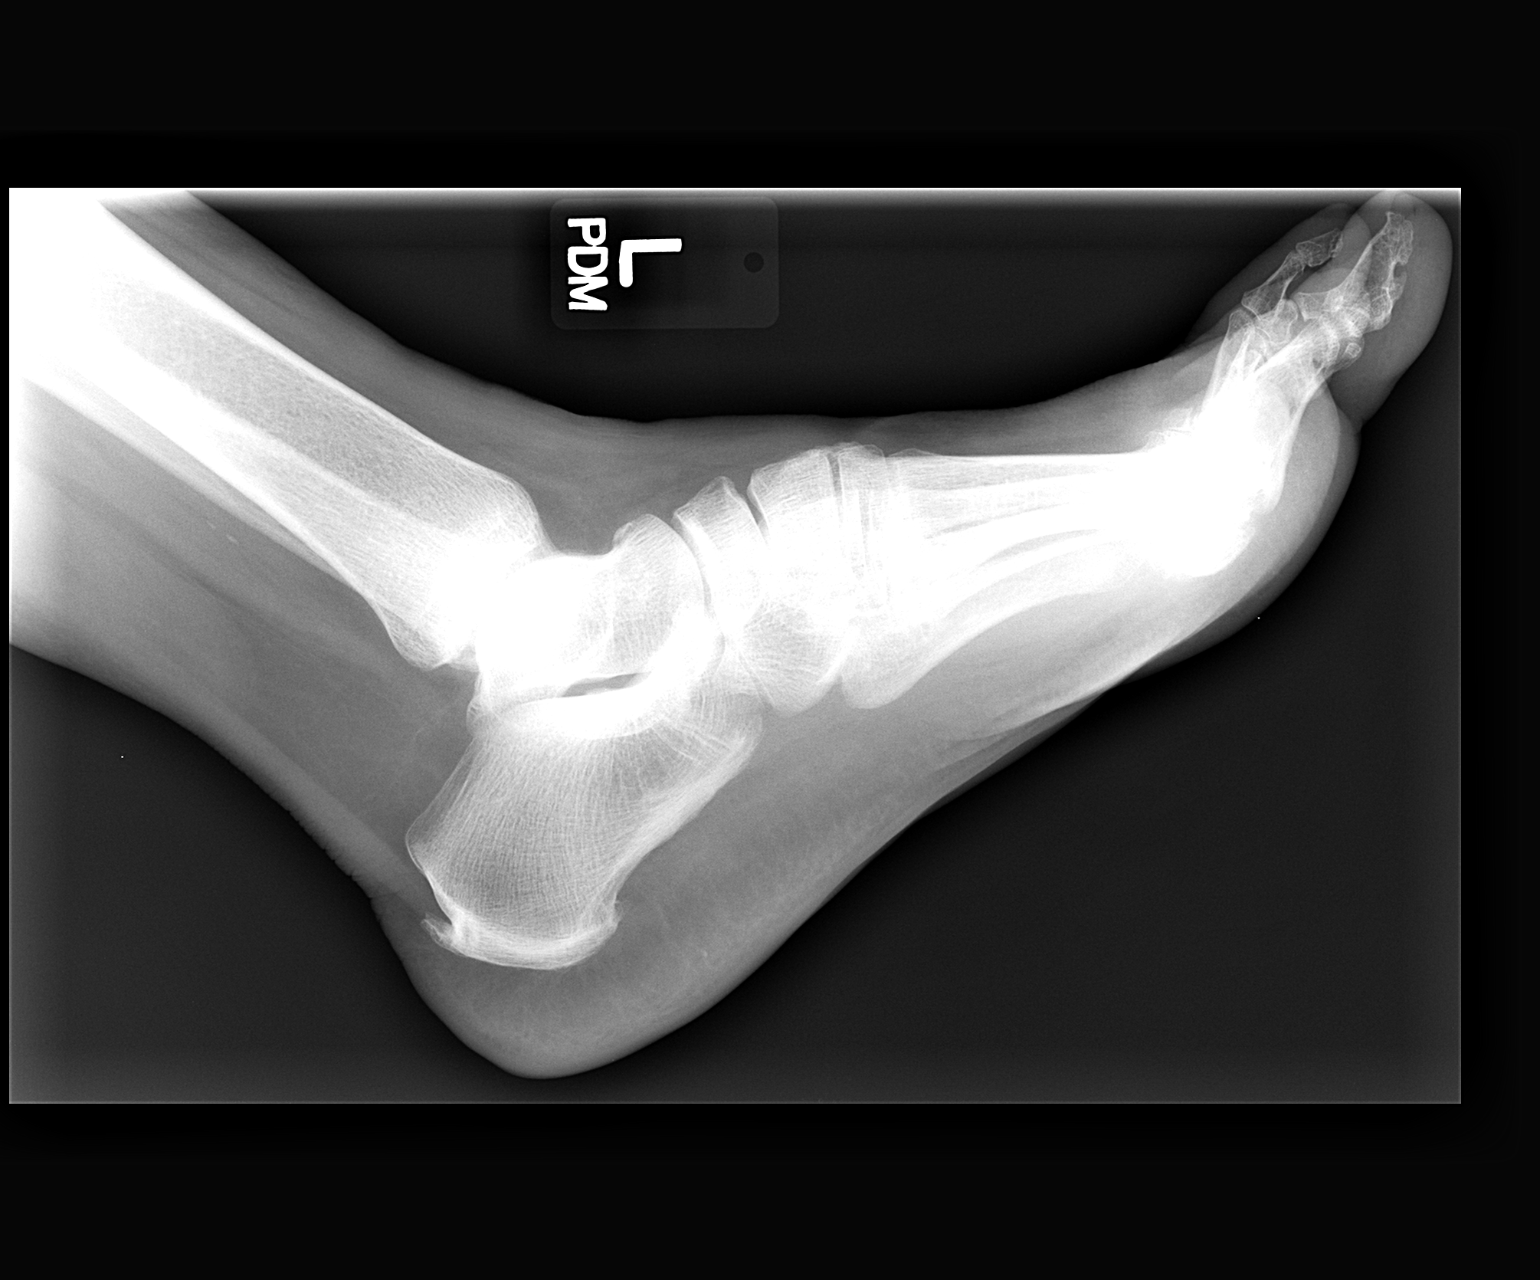

[3 of 3 positions shown; findings below may reference images not displayed]

FINDINGS: No acute bony or joint abnormality identified. No evidence of
fracture dislocation. Degenerative changes noted first MTP joint.
IMPRESSION: Severe degenerative changes first MTP joint.  No acute abnormality.

## 2016-12-05 ENCOUNTER — Encounter: Payer: Self-pay | Admitting: Internal Medicine

## 2016-12-06 MED ORDER — GABAPENTIN 300 MG PO CAPS: 300.0000 mg | ORAL_CAPSULE | Freq: Every day | ORAL | 0 refills | Status: DC

## 2016-12-11 ENCOUNTER — Ambulatory Visit
Admission: RE | Admit: 2016-12-11 | Discharge: 2016-12-11 | Disposition: A | Discharge status: Home / Self Care | Payer: Medicare HMO | Source: Ambulatory Visit | Attending: Internal Medicine | Admitting: Internal Medicine

## 2016-12-11 DIAGNOSIS — R921 Mammographic calcification found on diagnostic imaging of breast: Secondary | ICD-10-CM

## 2016-12-11 HISTORY — DX: Personal history of irradiation: Z92.3

## 2017-01-28 DIAGNOSIS — G629 Polyneuropathy, unspecified: Secondary | ICD-10-CM | POA: Insufficient documentation

## 2017-01-28 NOTE — Patient Instructions (Addendum)

## 2017-01-28 NOTE — Progress Notes (Signed)
Subjective:    Patient ID: [name], female    DOB: 18-Sep-1945, 71 y.o.   MRN: [id number]  HPI The patient is here for follow up.  Right achilles pain:  It started the end of last month, 3 weeks ago.  She denies injuries or new activities.  It does not bother her at rest.  It hurts when she walks or touches it.  She denies swelling.  She has been taking advil 800 mg as needed.  It does help.  She is still working and does a lot of walking at work.  Prediabetes:  She is compliant with a low sugar/carbohydrate diet.  She is active at work, but not exercising regularly.  Hyperlipidemia: She is taking her medication daily. She is compliant with a low fat/cholesterol diet. She is not exercising regularly. She denies myalgias.   Hyperlipidemia: She is taking her medication daily. She is compliant with a low fat/cholesterol diet. She is not exercising regularly. She denies myalgias.   GERD:  She is taking her medication daily as prescribed.  She denies any GERD symptoms and feels her GERD is well controlled.   Neuropathy at site of breast surgery:  She is taking gabapentin daily at night.  Her pain is well controlled.    Medications and allergies reviewed with patient and updated if appropriate.  Patient Active Problem List   Diagnosis Date Noted  . Neuropathy 01/28/2017  . Thyromegaly 08/13/2016  . Neck discomfort 08/13/2016  . Special screening for malignant neoplasms, colon   . Prediabetes 07/24/2015  . GERD (gastroesophageal reflux disease) 07/20/2015  . History of breast cancer 07/13/2015  . Vitiligo 07/07/2012  . PLANTAR FASCIITIS 06/16/2010  . Tobacco dependence 06/14/2009  . Hyperlipidemia 11/10/2008  . ANGIONEUROTIC EDEMA 01/09/2008  . CAROTID ARTERY STENOSIS, BILATERAL 06/17/2007  . Essential hypertension 06/12/2007  . Personal history of malignant neoplasm of breast 02/25/2007  . RHEUMATIC FEVER, HX OF 02/21/2007    Current Outpatient Prescriptions on File Prior  to Visit  Medication Sig Dispense Refill  . amLODipine (NORVASC) 10 MG tablet Take 1 tablet (10 mg total) by mouth daily. 90 tablet 3  . labetalol (NORMODYNE) 200 MG tablet Take 1 tablet (200 mg total) by mouth 2 (two) times daily. 180 tablet 3  . omeprazole (PRILOSEC) 40 MG capsule TAKE ONE CAPSULE BY MOUTH ONCE DAILY. 90 capsule 3  . atorvastatin (LIPITOR) 20 MG tablet Take 1 tablet (20 mg total) by mouth daily. 90 tablet 3   No current facility-administered medications on file prior to visit.     Past Medical History:  Diagnosis Date  . Absence of menstruation    amenorrhea  . Angioneurotic edema not elsewhere classified    due to ACE-I  . Breast disorder    breast cancer 2007  . Cancer [facility]) 2007   Left Breast Cancer  . Carotid stenosis    Carotid US (8/15):  RICA 40-98%; LICA 11-91% >>> FU 1 year  . H/O: rheumatic fever   . History of breast cancer 07/13/2015  . HLD (hyperlipidemia)   . Occlusion and stenosis of carotid artery without mention of cerebral infarction   . Other abnormal glucose    fasting hyperglycemia  . Personal history of malignant neoplasm of breast   . Personal history of radiation therapy 2007  . Unspecified essential hypertension     Past Surgical History:  Procedure Laterality Date  . APPENDECTOMY    . BREAST BIOPSY Left 12/06/2005   malignant  .  BREAST LUMPECTOMY Left 01/08/2006  . COLONOSCOPY  2005   negative; Dr Collene Mares  . COLONOSCOPY N/A 09/02/2015   Procedure: COLONOSCOPY;  Surgeon: Danie Binder, MD;  Location: [facility];  Service: Endoscopy;  Laterality: N/A;  1215-moved to 1130 Ginger to notify pt  . G0 P0     Dr. Maudry Diego    Social History   Social History  . Marital status: Single    Spouse name: N/A  . Number of children: N/A  . Years of education: N/A   Social History Main Topics  . Smoking status: Current Some Day Smoker    Packs/day: 0.50    Years: 48.00    Types: Cigarettes  . Smokeless tobacco: Never Used  . Alcohol  use No  . Drug use: No  . Sexual activity: Yes    Birth control/ protection: Post-menopausal   Other Topics Concern  . Not on file   Social History Narrative   Single, no children.   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    exercises regularly - walks 2 mpd weather permtting    Family History  Problem Relation Age of Onset  . Breast cancer Sister   . Hypertension Unknown        some of Pts brothers and sisters  . Heart attack Neg Hx   . Stroke Neg Hx   . Diabetes Neg Hx   . Heart disease Neg Hx     Review of Systems  Constitutional: Negative for chills and fever.  Respiratory: Negative for cough, shortness of breath and wheezing.   Cardiovascular: Negative for chest pain, palpitations and leg swelling.  Gastrointestinal: Negative for abdominal pain.  Musculoskeletal: Negative for myalgias.  Neurological: Negative for light-headedness and headaches.       Objective:   Vitals:   01/29/17 1430  BP: (!) 144/78  Pulse: 70  Resp: 16  Temp: 98.3 F (36.8 C)  SpO2: 97%   Wt Readings from Last 3 Encounters:  01/29/17 214 lb (97.1 kg)  08/13/16 217 lb (98.4 kg)  07/31/16 216 lb 3.2 oz (98.1 kg)   Body mass index is 36.73 kg/m.   Physical Exam    Constitutional: Appears well-developed and well-nourished. No distress.  HENT:  Head: Normocephalic and atraumatic.  Neck: Neck supple. No tracheal deviation present. No thyromegaly present.  No cervical lymphadenopathy Cardiovascular: Normal rate, regular rhythm and normal heart sounds.   No murmur heard. No carotid bruit .  No edema Pulmonary/Chest: Effort normal and breath sounds normal. No respiratory distress. No has no wheezes. No rales.  Musculoskeletal: Mild swelling at base of right Achilles, nontender to palpation, no skin  changes Skin: Skin is warm and dry. Not diaphoretic.  Psychiatric: Normal mood and affect. Behavior is normal.      Assessment & Plan:    See Problem List for Assessment and  Plan of chronic medical problems.

## 2017-01-28 NOTE — Assessment & Plan Note (Addendum)
Taking gabapentin Well controlled Continue current dose

## 2017-01-29 ENCOUNTER — Other Ambulatory Visit (INDEPENDENT_AMBULATORY_CARE_PROVIDER_SITE_OTHER): Payer: Medicare HMO

## 2017-01-29 ENCOUNTER — Encounter: Payer: Self-pay | Admitting: Internal Medicine

## 2017-01-29 ENCOUNTER — Other Ambulatory Visit: Payer: Self-pay | Admitting: *Deleted

## 2017-01-29 ENCOUNTER — Ambulatory Visit (INDEPENDENT_AMBULATORY_CARE_PROVIDER_SITE_OTHER): Payer: Medicare HMO | Admitting: Internal Medicine

## 2017-01-29 VITALS — BP 144/78 | HR 70 | Temp 98.3°F | Resp 16 | Wt 214.0 lb

## 2017-01-29 DIAGNOSIS — E785 Hyperlipidemia, unspecified: Secondary | ICD-10-CM

## 2017-01-29 DIAGNOSIS — K219 Gastro-esophageal reflux disease without esophagitis: Secondary | ICD-10-CM

## 2017-01-29 DIAGNOSIS — M7660 Achilles tendinitis, unspecified leg: Secondary | ICD-10-CM | POA: Diagnosis not present

## 2017-01-29 DIAGNOSIS — R69 Illness, unspecified: Secondary | ICD-10-CM | POA: Diagnosis not present

## 2017-01-29 DIAGNOSIS — F17200 Nicotine dependence, unspecified, uncomplicated: Secondary | ICD-10-CM | POA: Diagnosis not present

## 2017-01-29 DIAGNOSIS — G629 Polyneuropathy, unspecified: Secondary | ICD-10-CM | POA: Diagnosis not present

## 2017-01-29 DIAGNOSIS — R7303 Prediabetes: Secondary | ICD-10-CM

## 2017-01-29 DIAGNOSIS — I6523 Occlusion and stenosis of bilateral carotid arteries: Secondary | ICD-10-CM | POA: Insufficient documentation

## 2017-01-29 DIAGNOSIS — I10 Essential (primary) hypertension: Secondary | ICD-10-CM | POA: Diagnosis not present

## 2017-01-29 LAB — COMPREHENSIVE METABOLIC PANEL
ALK PHOS: 78 U/L (ref 39–117)
ALT: 8 U/L (ref 0–35)
AST: 15 U/L (ref 0–37)
Albumin: 4.3 g/dL (ref 3.5–5.2)
BILIRUBIN TOTAL: 0.7 mg/dL (ref 0.2–1.2)
BUN: 16 mg/dL (ref 6–23)
CALCIUM: 9.4 mg/dL (ref 8.4–10.5)
CO2: 27 meq/L (ref 19–32)
CREATININE: 0.77 mg/dL (ref 0.40–1.20)
Chloride: 101 mEq/L (ref 96–112)
GFR: 95.41 mL/min (ref >60.00)
GLUCOSE: 105 mg/dL — AB (ref 70–99)
Potassium: 4.8 mEq/L (ref 3.5–5.1)
Sodium: 137 mEq/L (ref 135–145)
TOTAL PROTEIN: 8.3 g/dL (ref 6.0–8.3)

## 2017-01-29 LAB — HEMOGLOBIN A1C: Hgb A1c MFr Bld: 6 % (ref 4.6–6.5)

## 2017-01-29 MED ORDER — GABAPENTIN 300 MG PO CAPS: 300.0000 mg | ORAL_CAPSULE | Freq: Every day | ORAL | 0 refills | Status: DC

## 2017-01-29 NOTE — Assessment & Plan Note (Signed)
GERD controlled Continue daily medication  

## 2017-01-29 NOTE — Assessment & Plan Note (Signed)
Blood pressure slightly elevated today She does not monitor her blood pressure regularly, but can have it checked by family members Advised her to start monitoring regularly It does look controlled in the recent past Continue current medications at current doses Work on weight loss CMP today

## 2017-01-29 NOTE — Assessment & Plan Note (Addendum)
Has tried nicotine replacement chantix is too expensive  She will let me know if she would like to try the [name]-she is saving money-she thinks this is the only thing that will work

## 2017-01-29 NOTE — Assessment & Plan Note (Addendum)
Right achilles pain Pain only with activity Mild swelling evident on exam We will refer to sports medicine Continue Advil for now

## 2017-01-29 NOTE — Assessment & Plan Note (Signed)
Check a1c Low sugar / carb diet Stressed regular exercise, weight loss  

## 2017-01-29 NOTE — Assessment & Plan Note (Signed)
Lipid panel has been well controlled Continue current dose of statin

## 2017-01-30 ENCOUNTER — Ambulatory Visit (INDEPENDENT_AMBULATORY_CARE_PROVIDER_SITE_OTHER): Payer: Medicare HMO | Admitting: Family Medicine

## 2017-01-30 ENCOUNTER — Encounter: Payer: Self-pay | Admitting: Family Medicine

## 2017-01-30 ENCOUNTER — Encounter: Payer: Self-pay | Admitting: Internal Medicine

## 2017-01-30 DIAGNOSIS — M7661 Achilles tendinitis, right leg: Secondary | ICD-10-CM

## 2017-01-30 MED ORDER — MELOXICAM 7.5 MG PO TABS: 7.5000 mg | ORAL_TABLET | Freq: Every day | ORAL | 1 refills | Status: DC

## 2017-01-30 NOTE — Assessment & Plan Note (Signed)
Findings are normal on ultrasound. This is likely a tendinitis. She does have a calcific change at the insertion but there is no pain over this area. - Counseled on home exercise therapy. - Initiate meloxicam - Counseled on obtaining an using a heel lift - If no improvement can consider nitroglycerin therapy or physical therapy

## 2017-01-30 NOTE — Progress Notes (Signed)
[name] - 71 y.o. female MRN [id number]  Date of birth: [birth year]  SUBJECTIVE:  Including CC & ROS.  Chief Complaint  Patient presents with  . Right achilles pain    present for 3 weeks, denies injury to the area. She has been walking a lot more recently. She has been taking ibuprofen.to help with the pain. She describes the pain as tender.     [name] is a 71 y.o. female that is  presenting with right Achilles pain. The pain has been present for 3 weeks. She denies any injury. 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 The pain is occurring at the mid substance of the Achilles tendon itself. Denies any significant swelling. Denies any improvement. Pain is worse with stepping off. There is no radiation of pain. Has been taking Advil with some improvement. Denies any history of similar pain. Denies any surgery. The pain is moderate in nature and tender to touch in that area.  Was seen on 10/23 for right Achilles pain and was advised to continue Advil.   Review of Systems  Constitutional: Negative for fever.  Musculoskeletal: Negative for gait problem and joint swelling.  Skin: Negative for color change.  Neurological: Negative for weakness and numbness.    HISTORY: Past Medical, Surgical, Social, and Family History Reviewed & Updated per EMR.   Pertinent Historical Findings include:  Past Medical History:  Diagnosis Date  . Absence of menstruation    amenorrhea  . Angioneurotic edema not elsewhere classified    due to ACE-I  . Breast disorder    breast cancer 2007  . Cancer [facility]) 2007   Left Breast Cancer  . Carotid stenosis    Carotid US (8/15):  RICA 78-58%; LICA 85-02% >>> FU 1 year  . H/O: rheumatic fever   . History of breast cancer 07/13/2015  . HLD (hyperlipidemia)   . Occlusion and stenosis of carotid artery without mention of cerebral infarction   . Other abnormal glucose    fasting hyperglycemia  .  Personal history of malignant neoplasm of breast   . Personal history of radiation therapy 2007  . Unspecified essential hypertension     Past Surgical History:  Procedure Laterality Date  . APPENDECTOMY    . BREAST BIOPSY Left 12/06/2005   malignant  . BREAST LUMPECTOMY Left 01/08/2006  . COLONOSCOPY  2005   negative; Dr Collene Mares  . COLONOSCOPY N/A 09/02/2015   Procedure: COLONOSCOPY;  Surgeon: Danie Binder, MD;  Location: [facility];  Service: Endoscopy;  Laterality: N/A;  1215-moved to 1130 Ginger to notify pt  . G0 P0     Dr. Maudry Diego    Allergies  Allergen Reactions  . Benazepril Hcl     REACTION: FACE SWELLING (ANGIOEDEMA); she can not take ARBS  !!! Because of a history of documented adverse serious drug reaction;Medi Alert bracelet  is recommended  . Peanut-Containing Drug Products     03/13/13 hivesw/o facial swelling  . Shellfish Allergy     03/15/13 hives & facial swelling  . Yellow Dyes (Non-Tartrazine)     12/14 hives  & facial swelling with Christene Lye Aid    Family History  Problem Relation Age of Onset  . Breast cancer Sister   . Hypertension Unknown        some of Pts brothers and sisters  . Heart attack Neg Hx   . Stroke Neg Hx   .  Diabetes Neg Hx   . Heart disease Neg Hx      Social History   Social History  . Marital status: Single    Spouse name: N/A  . Number of children: N/A  . Years of education: N/A   Occupational History  . Not on file.   Social History Main Topics  . Smoking status: Current Some Day Smoker    Packs/day: 0.50    Years: 48.00    Types: Cigarettes  . Smokeless tobacco: Never Used  . Alcohol use No  . Drug use: No  . Sexual activity: Yes    Birth control/ protection: Post-menopausal   Other Topics Concern  . Not on file   Social History Narrative   Single, no children.   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    exercises regularly - walks 2 mpd weather permtting     PHYSICAL EXAM:  VS: BP (!) 142/78  (BP Location: Left Arm, Patient Position: Sitting, Cuff Size: Normal)   Pulse 82   Temp 98.7 F (37.1 C) (Oral)   Ht 5\' 4"  (1.626 m)   Wt 215 lb (97.5 kg)   SpO2 99%   BMI 36.90 kg/m  Physical Exam Gen: NAD, alert, cooperative with exam, well-appearing ENT: normal lips, normal nasal mucosa,  Eye: normal EOM, normal conjunctiva and lids CV:  no edema, +2 pedal pulses   Resp: no accessory muscle use, non-labored,   Skin: no rashes, no areas of induration  Neuro: normal tone, normal sensation to touch Psych:  normal insight, alert and oriented MSK:  Right ankle: Some tenderness to palpation at the mid substance of the Achilles tendon. Normal ankle range of motion. Normal strength resistance. No obvious swelling or ecchymosis of the Achilles. Pain with step off. Neurovascularly intact  Limited ultrasound: Right Achilles:  Normal-appearing thickness and no mucoid changes within the intracellular matrix There is a calcific changes at the insertion of the Achilles No suggestions of a retrocalcaneal bursitis  Summary: Normal-appearing mid substance Achilles. Findings of a calcific insertion  Ultrasound and interpretation by Clearance Coots, MD             ASSESSMENT & PLAN:   Achilles tendinitis Findings are normal on ultrasound. This is likely a tendinitis. She does have a calcific change at the insertion but there is no pain over this area. - Counseled on home exercise therapy. - Initiate meloxicam - Counseled on obtaining an using a heel lift - If no improvement can consider nitroglycerin therapy or physical therapy

## 2017-01-30 NOTE — Patient Instructions (Addendum)
Thank you for coming in,   Please try the meloxicam. You can take this 1 to 2 times daily. Please take this for 10 days straight.   Please try to get a heel lift.   Please try to wear a compression on the ankle.   Please follow up with me if there is no improvement of your symptoms.    Please feel free to call with any questions or concerns at any time, at [phone]. --Dr. Raeford Razor

## 2017-03-09 ENCOUNTER — Other Ambulatory Visit: Payer: Self-pay | Admitting: Internal Medicine

## 2017-05-07 ENCOUNTER — Ambulatory Visit (HOSPITAL_COMMUNITY)
Admission: RE | Admit: 2017-05-07 | Discharge: 2017-05-07 | Disposition: A | Discharge status: Home / Self Care | Payer: Medicare HMO | Source: Ambulatory Visit | Attending: Internal Medicine | Admitting: Internal Medicine

## 2017-05-07 DIAGNOSIS — I6523 Occlusion and stenosis of bilateral carotid arteries: Secondary | ICD-10-CM | POA: Diagnosis not present

## 2017-05-07 DIAGNOSIS — I10 Essential (primary) hypertension: Secondary | ICD-10-CM | POA: Diagnosis not present

## 2017-05-07 DIAGNOSIS — E119 Type 2 diabetes mellitus without complications: Secondary | ICD-10-CM | POA: Diagnosis not present

## 2017-05-07 DIAGNOSIS — F17200 Nicotine dependence, unspecified, uncomplicated: Secondary | ICD-10-CM | POA: Diagnosis not present

## 2017-05-07 DIAGNOSIS — E785 Hyperlipidemia, unspecified: Secondary | ICD-10-CM | POA: Insufficient documentation

## 2017-05-07 DIAGNOSIS — Z01419 Encounter for gynecological examination (general) (routine) without abnormal findings: Secondary | ICD-10-CM | POA: Diagnosis not present

## 2017-05-07 DIAGNOSIS — R69 Illness, unspecified: Secondary | ICD-10-CM | POA: Diagnosis not present

## 2017-05-23 ENCOUNTER — Ambulatory Visit (INDEPENDENT_AMBULATORY_CARE_PROVIDER_SITE_OTHER): Payer: Medicare HMO | Admitting: Cardiovascular Disease

## 2017-05-23 ENCOUNTER — Encounter: Payer: Self-pay | Admitting: Cardiovascular Disease

## 2017-05-23 VITALS — BP 156/88 | HR 62 | Ht 64.0 in | Wt 217.0 lb

## 2017-05-23 DIAGNOSIS — E785 Hyperlipidemia, unspecified: Secondary | ICD-10-CM

## 2017-05-23 DIAGNOSIS — I10 Essential (primary) hypertension: Secondary | ICD-10-CM

## 2017-05-23 DIAGNOSIS — I6523 Occlusion and stenosis of bilateral carotid arteries: Secondary | ICD-10-CM | POA: Diagnosis not present

## 2017-05-23 MED ORDER — LABETALOL HCL 200 MG PO TABS: 200.0000 mg | ORAL_TABLET | Freq: Two times a day (BID) | ORAL | 3 refills | Status: DC

## 2017-05-23 MED ORDER — ASPIRIN EC 81 MG PO TBEC: 81.0000 mg | DELAYED_RELEASE_TABLET | Freq: Every day | ORAL | 3 refills | Status: DC

## 2017-05-23 MED ORDER — VARENICLINE TARTRATE 1 MG PO TABS: 1.0000 mg | ORAL_TABLET | Freq: Two times a day (BID) | ORAL | 1 refills | Status: AC

## 2017-05-23 MED ORDER — VARENICLINE TARTRATE 0.5 MG X 11 & 1 MG X 42 PO MISC: ORAL | 0 refills | Status: DC

## 2017-05-23 NOTE — Progress Notes (Signed)
Cardiology Office Note Date:  05/23/2017   ID:  Ivanell, Deshotel 10/02/1945, MRN [id number]  PCP:  Binnie Rail, MD  Cardiologist:  Sherren Mocha, MD    Chief Complaint  Patient presents with  . Follow-up    Carotid Stenosis     History of Present Illness: [name] is a 72 y.o. female who presents for follow-up of HTN and carotid stenosis. She has had mild bilateral carotid atherosclerosis without flow-limiting stenosis by serial duplex exams.   She's here alone today. Feeling well. Still smoking - never tried [name] because it was too expensive ($200). BP is high but states she didn't take amlodipine this morning. Also ran out of labetalol yesterday and only takes it once daily. She has no complaints today - Today, she denies symptoms of palpitations, chest pain, shortness of breath, orthopnea, PND, lower extremity edema, dizziness, or syncope.  She's still working no longer at [employer] but at another car [employer] in [place].   Past Medical History:  Diagnosis Date  . Absence of menstruation    amenorrhea  . Angioneurotic edema not elsewhere classified    due to ACE-I  . Breast disorder    breast cancer 2007  . Cancer [facility]) 2007   Left Breast Cancer  . Carotid stenosis    Carotid US (8/15):  RICA 24-40%; LICA 10-27% >>> FU 1 year  . H/O: rheumatic fever   . History of breast cancer 07/13/2015  . HLD (hyperlipidemia)   . Occlusion and stenosis of carotid artery without mention of cerebral infarction   . Other abnormal glucose    fasting hyperglycemia  . Personal history of malignant neoplasm of breast   . Personal history of radiation therapy 2007  . Unspecified essential hypertension     Past Surgical History:  Procedure Laterality Date  . APPENDECTOMY    . BREAST BIOPSY Left 12/06/2005   malignant  . BREAST LUMPECTOMY Left 01/08/2006  . COLONOSCOPY  2005   negative; Dr Collene Mares  . COLONOSCOPY N/A 09/02/2015   Procedure: COLONOSCOPY;  Surgeon: Danie Binder, MD;  [facility];  Service: Endoscopy;  Laterality: N/A;  1215-moved to 1130 Ginger to notify pt  . G0 P0     Dr. Maudry Diego    Current Outpatient Medications  Medication Sig Dispense Refill  . amLODipine (NORVASC) 10 MG tablet Take 1 tablet (10 mg total) by mouth daily. 90 tablet 3  . atorvastatin (LIPITOR) 20 MG tablet Take 20 mg by mouth daily.    Marland Kitchen labetalol (NORMODYNE) 200 MG tablet Take 1 tablet (200 mg total) by mouth 2 (two) times daily. 180 tablet 3  . omeprazole (PRILOSEC) 40 MG capsule TAKE ONE CAPSULE BY MOUTH ONCE DAILY. 90 capsule 3   No current facility-administered medications for this visit.     Allergies:   Benazepril hcl; Peanut-containing drug products; Shellfish allergy; and Yellow dyes (non-tartrazine)   Social History:  The patient  reports that she has been smoking cigarettes.  She has a 24.00 pack-year smoking history. she has never used smokeless tobacco. She reports that she does not drink alcohol or use drugs.   Family History:  The patient's  family history includes Breast cancer in her sister; Hypertension in her unknown relative.    ROS:  Please see the history of present illness. All other systems are reviewed and negative.    PHYSICAL EXAM: VS:  BP (!) 156/88   Pulse 62   Ht  5\' 4"  (1.626 m)   Wt 217 lb (98.4 kg)   BMI 37.25 kg/m  , BMI Body mass index is 37.25 kg/m. GEN: Well nourished, well developed, pleasant overweight woman in no acute distress  HEENT: normal  Neck: no JVD, no masses. BL carotid bruits Cardiac: RRR without murmur or gallop                Respiratory:  clear to auscultation bilaterally, normal work of breathing GI: soft, nontender, nondistended, + BS MS: no deformity or atrophy  Ext: no pretibial edema, pedal pulses 2+= bilaterally Skin: warm and dry, no rash Neuro:  Strength and sensation are intact Psych: euthymic mood, full affect  EKG:  EKG is ordered  today. The ekg ordered today shows NSR 62 bpm, within normal limits  Recent Labs: 01/29/2017: ALT 8; BUN 16; Creatinine, Ser 0.77; Potassium 4.8; Sodium 137   Lipid Panel     Component Value Date/Time   CHOL 168 05/18/2016 0844   TRIG 94 05/18/2016 0844   HDL 49 05/18/2016 0844   CHOLHDL 3.4 05/18/2016 0844   CHOLHDL 5 07/20/2015 1006   VLDL 29.6 07/20/2015 1006   LDLCALC 100 (H) 05/18/2016 0844   LDLDIRECT 141.8 07/31/2012 1537      Wt Readings from Last 3 Encounters:  05/23/17 217 lb (98.4 kg)  01/30/17 215 lb (97.5 kg)  01/29/17 214 lb (97.1 kg)     Cardiac Studies Reviewed: Carotid Duplex: <40% BL ICA stenosis  ASSESSMENT AND PLAN: 1.  Carotid atherosclerosis: clinical FU in one year. Repeat imaging in 2 years. Add ASA 81 mg daily.  2. HTN, uncontrolled. Medication adherence is an issue. We discussed the importance of taking labetalol twice daily and also amlodipine. Discussed a low sodium diet.   3. Hyperlipidemia: continue statin drug. Lifestyle counseling done.   4. Tobacco abuse: lengthy discussion today. Strategies for cessation reviewed. Discussed pros and cons of [name]. Rx written.   Current medicines are reviewed with the patient today.  The patient does not have concerns regarding medicines.  Labs/ tests ordered today include:  No orders of the defined types were placed in this encounter.   Disposition:   FU one year  Signed, Sherren Mocha, MD  05/23/2017 4:19 PM    [facility] [address], [place], [place]  [address] Phone: [phone]; Fax: [phone]

## 2017-05-23 NOTE — Patient Instructions (Signed)
Medication Instructions:  1) START ASPIRIN 81 mg daily 2) TAKE [name] as directed  Labwork: None  Testing/Procedures: None  Follow-Up: Your provider wants you to follow-up in: 1 year with Dr. Burt Knack. You will receive a reminder letter in the mail two months in advance. If you don't receive a letter, please call our office to schedule the follow-up appointment.    Any Other Special Instructions Will Be Listed Below (If Applicable).     If you need a refill on your cardiac medications before your next appointment, please call your pharmacy.

## 2017-05-27 ENCOUNTER — Telehealth: Payer: Self-pay

## 2017-05-27 NOTE — Telephone Encounter (Signed)
**Note De-Identified [name] Obfuscation** I have done a [name] PA through covermymeds.

## 2017-05-29 NOTE — Telephone Encounter (Signed)
**Note De-Identified [name] Obfuscation** Approval received on [name] PA Starr Engel fax from [facility]. Approval good from 04/07/2017 until 04/08/2018.  I have notified the pts pharmacy.

## 2017-07-07 ENCOUNTER — Other Ambulatory Visit: Payer: Self-pay | Admitting: Internal Medicine

## 2017-07-30 ENCOUNTER — Ambulatory Visit: Payer: Medicare HMO | Admitting: Internal Medicine

## 2017-08-06 NOTE — Progress Notes (Signed)
Subjective:    Patient ID: [name], female    DOB: June 09, 1945, 72 y.o.   MRN: [id number]  HPI    Medications and allergies reviewed with patient and updated if appropriate.  Patient Active Problem List   Diagnosis Date Noted  . Achilles tendinitis 01/29/2017  . Bilateral carotid artery occlusion 01/29/2017  . Neuropathy 01/28/2017  . Thyromegaly 08/13/2016  . Neck discomfort 08/13/2016  . Special screening for malignant neoplasms, colon   . Prediabetes 07/24/2015  . GERD (gastroesophageal reflux disease) 07/20/2015  . History of breast cancer 07/13/2015  . Vitiligo 07/07/2012  . PLANTAR FASCIITIS 06/16/2010  . Tobacco dependence 06/14/2009  . Hyperlipidemia 11/10/2008  . ANGIONEUROTIC EDEMA 01/09/2008  . CAROTID ARTERY STENOSIS, BILATERAL 06/17/2007  . Essential hypertension 06/12/2007  . Personal history of malignant neoplasm of breast 02/25/2007  . RHEUMATIC FEVER, HX OF 02/21/2007    Current Outpatient Medications on File Prior to Visit  Medication Sig Dispense Refill  . amLODipine (NORVASC) 10 MG tablet TAKE 1 TABLET BY MOUTH EVERY DAY 90 tablet 0  . aspirin EC 81 MG tablet Take 1 tablet (81 mg total) by mouth daily. 90 tablet 3  . atorvastatin (LIPITOR) 20 MG tablet Take 20 mg by mouth daily.    Marland Kitchen labetalol (NORMODYNE) 200 MG tablet Take 1 tablet (200 mg total) by mouth 2 (two) times daily. 180 tablet 3  . omeprazole (PRILOSEC) 40 MG capsule TAKE ONE CAPSULE BY MOUTH ONCE DAILY. 90 capsule 3  . varenicline ([name] STARTING MONTH PAK) 0.5 MG X 11 & 1 MG X 42 tablet Take one 0.5 mg tablet by mouth once daily for 3 days, then increase to one 0.5 mg tablet twice daily for 4 days, then increase to one 1 mg tablet twice daily. 53 tablet 0   No current facility-administered medications on file prior to visit.     Past Medical History:  Diagnosis Date  . Absence of menstruation    amenorrhea  . Angioneurotic edema not elsewhere classified    due to ACE-I  .  Breast disorder    breast cancer 2007  . Cancer [facility]) 2007   Left Breast Cancer  . Carotid stenosis    Carotid US (8/15):  RICA 62-13%; LICA 08-65% >>> FU 1 year  . H/O: rheumatic fever   . History of breast cancer 07/13/2015  . HLD (hyperlipidemia)   . Occlusion and stenosis of carotid artery without mention of cerebral infarction   . Other abnormal glucose    fasting hyperglycemia  . Personal history of malignant neoplasm of breast   . Personal history of radiation therapy 2007  . Unspecified essential hypertension     Past Surgical History:  Procedure Laterality Date  . APPENDECTOMY    . BREAST BIOPSY Left 12/06/2005   malignant  . BREAST LUMPECTOMY Left 01/08/2006  . COLONOSCOPY  2005   negative; Dr Collene Mares  . COLONOSCOPY N/A 09/02/2015   Procedure: COLONOSCOPY;  Surgeon: Danie Binder, MD;  Location[facility];  Service: Endoscopy;  Laterality: N/A;  1215-moved to 1130 Ginger to notify pt  . G0 P0     Dr. Maudry Diego    Social History   Socioeconomic History  . Marital status: Single    Spouse name: Not on file  . Number of children: Not on file  . Years of education: Not on file  . Highest education level: Not on file  Occupational History  . Not on file  Social Needs  .  Financial resource strain: Not on file  . Food insecurity:    Worry: Not on file    Inability: Not on file  . Transportation needs:    Medical: Not on file    Non-medical: Not on file  Tobacco Use  . Smoking status: Current Some Day Smoker    Packs/day: 0.50    Years: 48.00    Pack years: 24.00    Types: Cigarettes  . Smokeless tobacco: Never Used  Substance and Sexual Activity  . Alcohol use: No  . Drug use: No  . Sexual activity: Yes    Birth control/protection: Post-menopausal  Lifestyle  . Physical activity:    Days per week: Not on file    Minutes per session: Not on file  . Stress: Not on file  Relationships  . Social connections:    Talks on phone: Not on file    Gets  together: Not on file    Attends religious service: Not on file    Active member of club or organization: Not on file    Attends meetings of clubs or organizations: Not on file    Relationship status: Not on file  Other Topics Concern  . Not on file  Social History Narrative   Single, no children.   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    exercises regularly - walks 2 mpd weather permtting    Family History  Problem Relation Age of Onset  . Breast cancer Sister   . Hypertension Unknown        some of Pts brothers and sisters  . Heart attack Neg Hx   . Stroke Neg Hx   . Diabetes Neg Hx   . Heart disease Neg Hx     Review of Systems     Objective:  There were no vitals filed for this visit. BP Readings from Last 3 Encounters:  05/23/17 (!) 156/88  01/30/17 (!) 142/78  01/29/17 (!) 144/78   Wt Readings from Last 3 Encounters:  05/23/17 217 lb (98.4 kg)  01/30/17 215 lb (97.5 kg)  01/29/17 214 lb (97.1 kg)   There is no height or weight on file to calculate BMI.   Physical Exam         Assessment & Plan:    See Problem List for Assessment and Plan of chronic medical problems.   This encounter was created in error - please disregard.

## 2017-08-07 ENCOUNTER — Encounter: Payer: Medicare HMO | Admitting: Internal Medicine

## 2017-08-08 ENCOUNTER — Ambulatory Visit: Payer: Medicare HMO

## 2017-08-18 ENCOUNTER — Other Ambulatory Visit: Payer: Self-pay | Admitting: Cardiovascular Disease

## 2017-08-19 NOTE — Patient Instructions (Addendum)
  Test(s) ordered today. Your results will be released to [place] (or called to you) after review, usually within 72hours after test completion. If any changes need to be made, you will be notified at that same time.  Medications reviewed and updated.  Changes include start wellbutrin for smoking cessation and increasing labetalol to 300 mg twice a day.   Your prescription(s) have been submitted to your pharmacy. Please take as directed and contact our office if you believe you are having problem(s) with the medication(s).   Please followup in 3 months for follow up of your blood pressure.

## 2017-08-19 NOTE — Progress Notes (Signed)
Subjective:    Patient ID: [name], female    DOB: June 10, 1945, 72 y.o.   MRN: [id number]  HPI The patient is here for follow up.  Hypertension: She is taking her medication daily. She is not compliant with a low sodium diet.  She denies chest pain, palpitations, shortness of breath and regular headaches. She is not exercising regularly.  She does not monitor her blood pressure at home.    Hyperlipidemia: She is taking her medication daily. She is compliant with a low fat/cholesterol diet. She is not exercising regularly. She denies myalgias.   Prediabetes:  She is compliant with a low sugar/carbohydrate diet.  She is not exercising regularly.  GERD:  She is taking her medication daily as prescribed.  She denies any GERD symptoms and feels her GERD is well controlled.   Vitiligo:  She has vitiligo of her external genitalia.  She was evaluated by derm and gyn and she was prescribed a steroid cream which worked.  This comes and goes.  It is flared up now and would like to restart the cream - it is a steroid cream.  Tobacco abuse:  She is still smoking.  She could not afford chantix.  She wonders about nicotine patches.  She has never heard about Wellbutrin.  Medications and allergies reviewed with patient and updated if appropriate.  Patient Active Problem List   Diagnosis Date Noted  . Achilles tendinitis 01/29/2017  . Bilateral carotid artery occlusion 01/29/2017  . Neuropathy 01/28/2017  . Thyromegaly 08/13/2016  . Neck discomfort 08/13/2016  . Special screening for malignant neoplasms, colon   . Prediabetes 07/24/2015  . GERD (gastroesophageal reflux disease) 07/20/2015  . History of breast cancer 07/13/2015  . Vitiligo 07/07/2012  . PLANTAR FASCIITIS 06/16/2010  . Tobacco dependence 06/14/2009  . Hyperlipidemia 11/10/2008  . ANGIONEUROTIC EDEMA 01/09/2008  . CAROTID ARTERY STENOSIS, BILATERAL 06/17/2007  . Essential hypertension 06/12/2007  . Personal history of  malignant neoplasm of breast 02/25/2007  . RHEUMATIC FEVER, HX OF 02/21/2007    Current Outpatient Medications on File Prior to Visit  Medication Sig Dispense Refill  . amLODipine (NORVASC) 10 MG tablet TAKE 1 TABLET BY MOUTH EVERY DAY 90 tablet 0  . aspirin EC 81 MG tablet Take 1 tablet (81 mg total) by mouth daily. 90 tablet 3  . atorvastatin (LIPITOR) 20 MG tablet TAKE 1 TABLET BY MOUTH EVERY DAY 90 tablet 3  . labetalol (NORMODYNE) 200 MG tablet Take 1 tablet (200 mg total) by mouth 2 (two) times daily. 180 tablet 3  . omeprazole (PRILOSEC) 40 MG capsule TAKE ONE CAPSULE BY MOUTH ONCE DAILY. 90 capsule 3  . varenicline ([name] STARTING MONTH PAK) 0.5 MG X 11 & 1 MG X 42 tablet Take one 0.5 mg tablet by mouth once daily for 3 days, then increase to one 0.5 mg tablet twice daily for 4 days, then increase to one 1 mg tablet twice daily. 53 tablet 0   No current facility-administered medications on file prior to visit.     Past Medical History:  Diagnosis Date  . Absence of menstruation    amenorrhea  . Angioneurotic edema not elsewhere classified    due to ACE-I  . Breast disorder    breast cancer 2007  . Cancer [facility]) 2007   Left Breast Cancer  . Carotid stenosis    Carotid US (8/15):  RICA 40-08%; LICA 67-61% >>> FU 1 year  . H/O: rheumatic fever   . History  of breast cancer 07/13/2015  . HLD (hyperlipidemia)   . Occlusion and stenosis of carotid artery without mention of cerebral infarction   . Other abnormal glucose    fasting hyperglycemia  . Personal history of malignant neoplasm of breast   . Personal history of radiation therapy 2007  . Unspecified essential hypertension     Past Surgical History:  Procedure Laterality Date  . APPENDECTOMY    . BREAST BIOPSY Left 12/06/2005   malignant  . BREAST LUMPECTOMY Left 01/08/2006  . COLONOSCOPY  2005   negative; Dr Collene Mares  . COLONOSCOPY N/A 09/02/2015   Procedure: COLONOSCOPY;  Surgeon: Danie Binder, MD;  Location[facility];  Service: Endoscopy;  Laterality: N/A;  1215-moved to 1130 Ginger to notify pt  . G0 P0     Dr. Maudry Diego    Social History   Socioeconomic History  . Marital status: Single    Spouse name: Not on file  . Number of children: Not on file  . Years of education: Not on file  . Highest education level: Not on file  Occupational History  . Not on file  Social Needs  . Financial resource strain: Not on file  . Food insecurity:    Worry: Not on file    Inability: Not on file  . Transportation needs:    Medical: Not on file    Non-medical: Not on file  Tobacco Use  . Smoking status: Current Some Day Smoker    Packs/day: 0.50    Years: 48.00    Pack years: 24.00    Types: Cigarettes  . Smokeless tobacco: Never Used  Substance and Sexual Activity  . Alcohol use: No  . Drug use: No  . Sexual activity: Yes    Birth control/protection: Post-menopausal  Lifestyle  . Physical activity:    Days per week: Not on file    Minutes per session: Not on file  . Stress: Not on file  Relationships  . Social connections:    Talks on phone: Not on file    Gets together: Not on file    Attends religious service: Not on file    Active member of club or organization: Not on file    Attends meetings of clubs or organizations: Not on file    Relationship status: Not on file  Other Topics Concern  . Not on file  Social History Narrative   Single, no children.   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    exercises regularly - walks 2 mpd weather permtting    Family History  Problem Relation Age of Onset  . Breast cancer Sister   . Hypertension Unknown        some of Pts brothers and sisters  . Heart attack Neg Hx   . Stroke Neg Hx   . Diabetes Neg Hx   . Heart disease Neg Hx     Review of Systems  Constitutional: Negative for chills and fever.  Respiratory: Positive for wheezing. Negative for cough and shortness of breath.   Cardiovascular: Positive for leg swelling  (right - chronic, mild). Negative for chest pain and palpitations.  Neurological: Negative for light-headedness and headaches.       Objective:   Vitals:   08/20/17 1324  BP: (!) 180/74  Pulse: 75  Resp: 16  Temp: 98.5 F (36.9 C)  SpO2: 93%   BP Readings from Last 3 Encounters:  08/20/17 (!) 180/74  05/23/17 (!) 156/88  01/30/17 (!) 142/78   Wt Readings from Last 3 Encounters:  08/20/17 213 lb (96.6 kg)  05/23/17 217 lb (98.4 kg)  01/30/17 215 lb (97.5 kg)   Body mass index is 36.56 kg/m.   Physical Exam    Constitutional: Appears well-developed and well-nourished. No distress.  HENT:  Head: Normocephalic and atraumatic.  Neck: Neck supple. No tracheal deviation present. No thyromegaly present.  No cervical lymphadenopathy Cardiovascular: Normal rate, regular rhythm and normal heart sounds.   No murmur heard. No carotid bruit .  No edema Pulmonary/Chest: Effort normal and breath sounds normal. No respiratory distress. No has no wheezes. No rales.  Skin: Skin is warm and dry. Not diaphoretic.  Psychiatric: Normal mood and affect. Behavior is normal.      Assessment & Plan:    See Problem List for Assessment and Plan of chronic medical problems.

## 2017-08-20 ENCOUNTER — Ambulatory Visit (INDEPENDENT_AMBULATORY_CARE_PROVIDER_SITE_OTHER): Payer: Medicare HMO | Admitting: Internal Medicine

## 2017-08-20 ENCOUNTER — Encounter: Payer: Self-pay | Admitting: Internal Medicine

## 2017-08-20 ENCOUNTER — Other Ambulatory Visit (INDEPENDENT_AMBULATORY_CARE_PROVIDER_SITE_OTHER): Payer: Medicare HMO

## 2017-08-20 ENCOUNTER — Ambulatory Visit (INDEPENDENT_AMBULATORY_CARE_PROVIDER_SITE_OTHER): Payer: Medicare HMO | Admitting: *Deleted

## 2017-08-20 VITALS — BP 180/74 | HR 75 | Resp 16 | Ht 64.0 in | Wt 213.0 lb

## 2017-08-20 VITALS — BP 180/74 | HR 75 | Temp 98.5°F | Resp 16 | Wt 213.0 lb

## 2017-08-20 DIAGNOSIS — F17210 Nicotine dependence, cigarettes, uncomplicated: Secondary | ICD-10-CM

## 2017-08-20 DIAGNOSIS — Z0000 Encounter for general adult medical examination without abnormal findings: Secondary | ICD-10-CM | POA: Diagnosis not present

## 2017-08-20 DIAGNOSIS — E785 Hyperlipidemia, unspecified: Secondary | ICD-10-CM

## 2017-08-20 DIAGNOSIS — L80 Vitiligo: Secondary | ICD-10-CM

## 2017-08-20 DIAGNOSIS — R7303 Prediabetes: Secondary | ICD-10-CM | POA: Diagnosis not present

## 2017-08-20 DIAGNOSIS — K219 Gastro-esophageal reflux disease without esophagitis: Secondary | ICD-10-CM

## 2017-08-20 DIAGNOSIS — I10 Essential (primary) hypertension: Secondary | ICD-10-CM

## 2017-08-20 DIAGNOSIS — F17200 Nicotine dependence, unspecified, uncomplicated: Secondary | ICD-10-CM

## 2017-08-20 DIAGNOSIS — R69 Illness, unspecified: Secondary | ICD-10-CM | POA: Diagnosis not present

## 2017-08-20 LAB — COMPREHENSIVE METABOLIC PANEL
ALBUMIN: 4.1 g/dL (ref 3.5–5.2)
ALK PHOS: 96 U/L (ref 39–117)
ALT: 6 U/L (ref 0–35)
AST: 10 U/L (ref 0–37)
BILIRUBIN TOTAL: 0.6 mg/dL (ref 0.2–1.2)
BUN: 15 mg/dL (ref 6–23)
CALCIUM: 9.5 mg/dL (ref 8.4–10.5)
CO2: 26 mEq/L (ref 19–32)
Chloride: 103 mEq/L (ref 96–112)
Creatinine, Ser: 0.8 mg/dL (ref 0.40–1.20)
GFR: 91.14 mL/min (ref >60.00)
GLUCOSE: 97 mg/dL (ref 70–99)
Potassium: 4.1 mEq/L (ref 3.5–5.1)
Sodium: 143 mEq/L (ref 135–145)
TOTAL PROTEIN: 8.2 g/dL (ref 6.0–8.3)

## 2017-08-20 LAB — LIPID PANEL
CHOL/HDL RATIO: 3
CHOLESTEROL: 172 mg/dL (ref 0–200)
HDL: 66 mg/dL (ref >39.00)
LDL Cholesterol: 81 mg/dL (ref 0–99)
NonHDL: 106.38
TRIGLYCERIDES: 126 mg/dL (ref 0.0–149.0)
VLDL: 25.2 mg/dL (ref 0.0–40.0)

## 2017-08-20 LAB — TSH: TSH: 1.67 u[IU]/mL (ref 0.35–4.50)

## 2017-08-20 LAB — HEMOGLOBIN A1C: Hgb A1c MFr Bld: 6 % (ref 4.6–6.5)

## 2017-08-20 MED ORDER — BUPROPION HCL ER (SR) 150 MG PO TB12: 150.0000 mg | ORAL_TABLET | Freq: Two times a day (BID) | ORAL | 5 refills | Status: DC

## 2017-08-20 MED ORDER — HYDROCORTISONE VALERATE 0.2 % EX CREA: 1.0000 "application " | TOPICAL_CREAM | Freq: Two times a day (BID) | CUTANEOUS | 3 refills | Status: DC

## 2017-08-20 MED ORDER — LABETALOL HCL 300 MG PO TABS: 300.0000 mg | ORAL_TABLET | Freq: Two times a day (BID) | ORAL | 1 refills | Status: DC

## 2017-08-20 NOTE — Progress Notes (Addendum)
Subjective:   [name] is a 72 y.o. female who presents for an Initial Medicare Annual Wellness Visit.  Review of Systems    No ROS.  Medicare Wellness Visit. Additional risk factors are reflected in the social history.   Cardiac Risk Factors include: advanced age (>85men, >37 women);dyslipidemia;hypertension;obesity (BMI >30kg/m2)  Sleep patterns: feels rested on waking, gets up 1 times nightly to void and sleeps 7 hours nightly.    Home Safety/Smoke Alarms: Feels safe in home. Smoke alarms in place.  Living environment; residence and Firearm Safety: 1-story house/ trailer, no firearms.Lives alone, no needs for DME, good support system Seat Belt Safety/Bike Helmet: Wears seat belt.     Objective:    Today's Vitals   08/20/17 1324  BP: (!) 180/74  Pulse: 75  Resp: 16  SpO2: 93%  Weight: 213 lb (96.6 kg)  Height: 5\' 4"  (1.626 m)   Body mass index is 36.56 kg/m.  Advanced Directives 08/20/2017 09/02/2015  Does Patient Have a Medical Advance Directive? No No  Would patient like information on creating a medical advance directive? Yes (ED - Information included in AVS) No - patient declined information    Current Medications (verified) Outpatient Encounter Medications as of 08/20/2017  Medication Sig  . amLODipine (NORVASC) 10 MG tablet TAKE 1 TABLET BY MOUTH EVERY DAY  . aspirin EC 81 MG tablet Take 1 tablet (81 mg total) by mouth daily.  Marland Kitchen atorvastatin (LIPITOR) 20 MG tablet TAKE 1 TABLET BY MOUTH EVERY DAY  . buPROPion (WELLBUTRIN SR) 150 MG 12 hr tablet Take 1 tablet (150 mg total) by mouth 2 (two) times daily.  Marland Kitchen labetalol (NORMODYNE) 300 MG tablet Take 1 tablet (300 mg total) by mouth 2 (two) times daily.  Marland Kitchen omeprazole (PRILOSEC) 40 MG capsule TAKE ONE CAPSULE BY MOUTH ONCE DAILY.  . [DISCONTINUED] labetalol (NORMODYNE) 200 MG tablet Take 1 tablet (200 mg total) by mouth 2 (two) times daily.  . [DISCONTINUED] varenicline ([name] STARTING MONTH PAK) 0.5 MG X 11 &  1 MG X 42 tablet Take one 0.5 mg tablet by mouth once daily for 3 days, then increase to one 0.5 mg tablet twice daily for 4 days, then increase to one 1 mg tablet twice daily.   No facility-administered encounter medications on file as of 08/20/2017.     Allergies (verified) Benazepril hcl; Peanut-containing drug products; Shellfish allergy; and Yellow dyes (non-tartrazine)   History: Past Medical History:  Diagnosis Date  . Absence of menstruation    amenorrhea  . Angioneurotic edema not elsewhere classified    due to ACE-I  . Breast disorder    breast cancer 2007  . [facility]   Left Breast Cancer  . Carotid stenosis    Carotid US (8/15):  RICA 35-46%; LICA 56-81% >>> FU 1 year  . H/O: rheumatic fever   . History of breast cancer 07/13/2015  . HLD (hyperlipidemia)   . Occlusion and stenosis of carotid artery without mention of cerebral infarction   . Other abnormal glucose    fasting hyperglycemia  . Personal history of malignant neoplasm of breast   . Personal history of radiation therapy 2007  . Unspecified essential hypertension    Past Surgical History:  Procedure Laterality Date  . APPENDECTOMY    . BREAST BIOPSY Left 12/06/2005   malignant  . BREAST LUMPECTOMY Left 01/08/2006  . COLONOSCOPY  2005   negative; Dr Collene Mares  . COLONOSCOPY N/A 09/02/2015   Procedure: COLONOSCOPY;  Surgeon:  Danie Binder, MD;  Location: [facility];  Service: Endoscopy;  Laterality: N/A;  1215-moved to 1130 Ginger to notify pt  . G0 P0     Dr. Maudry Diego   Family History  Problem Relation Age of Onset  . Breast cancer Sister   . Hypertension Unknown        some of Pts brothers and sisters  . Heart attack Neg Hx   . Stroke Neg Hx   . Diabetes Neg Hx   . Heart disease Neg Hx    Social History   Socioeconomic History  . Marital status: Single    Spouse name: Not on file  . Number of children: Not on file  . Years of education: Not on file  . Highest education level: Not on  file  Occupational History  . Not on file  Social Needs  . Financial resource strain: Not hard at all  . Food insecurity:    Worry: Never true    Inability: Never true  . Transportation needs:    Medical: No    Non-medical: No  Tobacco Use  . Smoking status: Current Some Day Smoker    Packs/day: 0.50    Years: 48.00    Pack years: 24.00    Types: Cigarettes  . Smokeless tobacco: Never Used  Substance and Sexual Activity  . Alcohol use: No  . Drug use: No  . Sexual activity: Yes    Birth control/protection: Post-menopausal  Lifestyle  . Physical activity:    Days per week: 0 days    Minutes per session: 0 min  . Stress: Not at all  Relationships  . Social connections:    Talks on phone: More than three times a week    Gets together: More than three times a week    Attends religious service: More than 4 times per year    Active member of club or organization: Yes    Attends meetings of clubs or organizations: More than 4 times per year    Relationship status: Not on file  Other Topics Concern  . Not on file  Social History Narrative   Single, no children.   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     Tobacco Counseling Ready to quit: Not Answered Counseling given: Not Answered  Activities of Daily Living In your present state of health, do you have any difficulty performing the following activities: 08/20/2017  Hearing? N  Vision? N  Difficulty concentrating or making decisions? N  Walking or climbing stairs? N  Dressing or bathing? N  Doing errands, shopping? N  Preparing Food and eating ? N  Using the Toilet? N  In the past six months, have you accidently leaked urine? N  Do you have problems with loss of bowel control? N  Managing your Medications? N  Managing your Finances? N  Housekeeping or managing your Housekeeping? N  Some recent data might be hidden     Immunizations and Health Maintenance  There is no immunization history on file for  this patient. Health Maintenance Due  Topic Date Due  . TETANUS/TDAP  06/04/1966    Patient Care Team: Binnie Rail, MD as PCP - General (Internal Medicine) Sherren Mocha, MD as PCP - Cardiology (Cardiology)  Indicate any recent Medical Services you may have received from other than [facility] providers in the past year (date may be approximate).     Assessment:   This is a routine wellness examination for Guillermina.  Physical assessment deferred to PCP.   Hearing/Vision screen Hearing Screening Comments: Able to hear conversational tones w/o difficulty. No issues reported.   Passed whisper test Vision Screening Comments: appointment yearly   Dietary issues and exercise activities discussed: Current Exercise Habits: The patient does not participate in regular exercise at present(chair exercises provided), Exercise limited by: None identified  Diet (meal preparation, eat out, water intake, caffeinated beverages, dairy products, fruits and vegetables): in general, an "unhealthy" diet, on average, 1 meals per day   Reviewed heart healthy and diabetic diet, encouraged patient to increase daily water intake. Discussed weight loss strategies.  Diet education was attached to patient's AVS. Relevant patient education assigned to patient using Emmi.  Goals    . Patient Stated     I want to start to go to the [facility] 2-3 times weekly on Mon. Wed. Fri.  Monitor my diet for sugar, carbohydrates and fat. Decrease the amount of cigarettes I smoke daily until I eventually quit.       Depression Screen PHQ 2/9 Scores 08/20/2017 03/09/2015 07/31/2012  PHQ - 2 Score 0 0 0  PHQ- 9 Score 0 - -    Fall Risk Fall Risk  08/20/2017 03/09/2015 07/31/2012  Falls in the past year? No No No   Cognitive Function:       Ad8 score reviewed for issues:  Issues making decisions: no  Less interest in hobbies / activities: no  Repeats questions, stories (family complaining): no  Trouble using ordinary  gadgets (microwave, computer, phone):no  Forgets the month or year: no  Mismanaging finances: no  Remembering appts: no  Daily problems with thinking and/or memory: no Ad8 score is= 0 Screening Tests Health Maintenance  Topic Date Due  . TETANUS/TDAP  06/04/1964  . PNA vac Low Risk Adult (1 of 2 - PCV13) 08/21/2018 (Originally 06/04/2012)  . INFLUENZA VACCINE  11/07/2017  . MAMMOGRAM  12/12/2018  . COLONOSCOPY  09/01/2025  . DEXA SCAN  Completed  . Hepatitis C Screening  Completed     Plan:   [facility] smoking cessation class schedule and information provided.   Continue doing brain stimulating activities (puzzles, reading, adult coloring books, staying active) to keep memory sharp.   Continue to eat heart healthy diet (full of fruits, vegetables, whole grains, lean protein, water--limit salt, fat, and sugar intake) and increase physical activity as tolerated.   I have personally reviewed and noted the following in the patient's chart:   . Medical and social history . Use of alcohol, tobacco or illicit drugs  . Current medications and supplements . Functional ability and status . Nutritional status . Physical activity . Advanced directives . List of other physicians . Vitals . Screenings to include cognitive, depression, and falls . Referrals and appointments  In addition, I have reviewed and discussed with patient certain preventive protocols, quality metrics, and best practice recommendations. A written personalized care plan for preventive services as well as general preventive health recommendations were provided to patient.     Michiel Cowboy, RN   08/20/2017     Medical screening examination/treatment/procedure(s) were performed by non-physician practitioner and as supervising physician I was immediately available for consultation/collaboration. I agree with above. Binnie Rail, MD

## 2017-08-20 NOTE — Patient Instructions (Addendum)
Continue doing brain stimulating activities (puzzles, reading, adult coloring books, staying active) to keep memory sharp.   Continue to eat heart healthy diet (full of fruits, vegetables, whole grains, lean protein, water--limit salt, fat, and sugar intake) and increase physical activity as tolerated.   [name] , Thank you for taking time to come for your Medicare Wellness Visit. I appreciate your ongoing commitment to your health goals. Please review the following plan we discussed and let me know if I can assist you in the future.   These are the goals we discussed: Goals    . Patient Stated     I want to start to go to the [facility] 2-3 times weekly on Mon. Wed. Fri.  Monitor my diet for sugar, carbohydrates and fat. Decrease the amount of cigarettes I smoke daily until I eventually quit.        This is a list of the screening recommended for you and due dates:  Health Maintenance  Topic Date Due  . Tetanus Vaccine  06/04/1966  . Pneumonia vaccines (1 of 2 - PCV13) 06/04/2012  . Flu Shot  11/07/2017  . Mammogram  12/12/2018  . Colon Cancer Screening  09/01/2025  . DEXA scan (bone density measurement)  Completed  .  Hepatitis C: One time screening is recommended by Center for Disease Control  (CDC) for  adults born from [birth year] through 1963.   Completed     Fat and Cholesterol Restricted Diet Getting too much fat and cholesterol in your diet may cause health problems. Following this diet helps keep your fat and cholesterol at normal levels. This can keep you from getting sick. What types of fat should I choose?  Choose monosaturated and polyunsaturated fats. These are found in foods such as olive oil, canola oil, flaxseeds, walnuts, almonds, and seeds.  Eat more omega-3 fats. Good choices include salmon, mackerel, sardines, tuna, flaxseed oil, and ground flaxseeds.  Limit saturated fats. These are in animal products such as meats, butter, and cream. They can also be in plant products  such as palm oil, palm kernel oil, and coconut oil.  Avoid foods with partially hydrogenated oils in them. These contain trans fats. Examples of foods that have trans fats are stick margarine, some tub margarines, cookies, crackers, and other baked goods. What general guidelines do I need to follow?  Check food labels. Look for the words "trans fat" and "saturated fat."  When preparing a meal: ? Fill half of your plate with vegetables and green salads. ? Fill one fourth of your plate with whole grains. Look for the word "whole" as the first word in the ingredient list. ? Fill one fourth of your plate with lean protein foods.  Eat more foods that have fiber, like apples, carrots, beans, peas, and barley.  Eat more home-cooked foods. Eat less at restaurants and buffets.  Limit or avoid alcohol.  Limit foods high in starch and sugar.  Limit fried foods.  Cook foods without frying them. Baking, boiling, grilling, and broiling are all great options.  Lose weight if you are overweight. Losing even a small amount of weight can help your overall health. It can also help prevent diseases such as diabetes and heart disease. What foods can I eat? Grains Whole grains, such as whole wheat or whole grain breads, crackers, cereals, and pasta. Unsweetened oatmeal, bulgur, barley, quinoa, or brown rice. Corn or whole wheat flour tortillas. Vegetables Fresh or frozen vegetables (raw, steamed, roasted, or grilled). Green salads.  Fruits All fresh, canned (in natural juice), or frozen fruits. Meat and Other Protein Products Ground beef (85% or leaner), grass-fed beef, or beef trimmed of fat. Skinless chicken or [place]. Ground chicken or [place]. Pork trimmed of fat. All fish and seafood. Eggs. Dried beans, peas, or lentils. Unsalted nuts or seeds. Unsalted canned or dry beans. Dairy Low-fat dairy products, such as skim or 1% milk, 2% or reduced-fat cheeses, low-fat ricotta or cottage cheese, or plain  low-fat yogurt. Fats and Oils Tub margarines without trans fats. Light or reduced-fat mayonnaise and salad dressings. Avocado. Olive, canola, sesame, or safflower oils. Natural peanut or almond butter (choose ones without added sugar and oil). The items listed above may not be a complete list of recommended foods or beverages. Contact your dietitian for more options. What foods are not recommended? Grains White bread. White pasta. White rice. Cornbread. Bagels, pastries, and croissants. Crackers that contain trans fat. Vegetables White potatoes. Corn. Creamed or fried vegetables. Vegetables in a cheese sauce. Fruits Dried fruits. Canned fruit in light or heavy syrup. Fruit juice. Meat and Other Protein Products Fatty cuts of meat. Ribs, chicken wings, bacon, sausage, bologna, salami, chitterlings, fatback, hot dogs, bratwurst, and packaged luncheon meats. Liver and organ meats. Dairy Whole or 2% milk, cream, half-and-half, and cream cheese. Whole milk cheeses. Whole-fat or sweetened yogurt. Full-fat cheeses. Nondairy creamers and whipped toppings. Processed cheese, cheese spreads, or cheese curds. Sweets and Desserts Corn syrup, sugars, honey, and molasses. Candy. Jam and jelly. Syrup. Sweetened cereals. Cookies, pies, cakes, donuts, muffins, and ice cream. Fats and Oils Butter, stick margarine, lard, shortening, ghee, or bacon fat. Coconut, palm kernel, or palm oils. Beverages Alcohol. Sweetened drinks (such as sodas, lemonade, and fruit drinks or punches). The items listed above may not be a complete list of foods and beverages to avoid. Contact your dietitian for more information. This information is not intended to replace advice given to you by your health care provider. Make sure you discuss any questions you have with your health care provider. Document Released: 09/25/2011 Document Revised: 12/01/2015 Document Reviewed: 06/25/2013 Elsevier Interactive Patient Education  2018 [place].  1-800-quit-smoking  Smoking Cessation Program Deneise Lever [place] - Day) Registration Open Jun 4 Tue To Jun 25 Tue Jun 4-25, 2019 Tuesday & Thursday 12:00 PM - 1:00 PM [place], [place] 1 Alternative Date Available expand/collapse The program is available for anyone 61 and older who currently smokes.  Fees & Payment There is no payment for this class.  10 Openings Available.  Description Schedule & Location Related Events Feel healthier, breathe easier and have more energy when you quit smoking!  Free four-class series includes a QuitSmart kit, which has been proven effective to help participants stop smoking.  The program is available for anyone 19 and older who currently smokes.  Participants should plan to attend all four dates and may choose from two convenient times.  Each class will be approximately 1 hour.  Classes will be held at [facility]  Address: [address], [name] [address]  Contact [employer] at [phone]  Carbohydrate Counting for Diabetes Mellitus, Adult Carbohydrate counting is a method for keeping track of how many carbohydrates you eat. Eating carbohydrates naturally increases the amount of sugar (glucose) in the blood. Counting how many carbohydrates you eat helps keep your blood glucose within normal limits,  which helps you manage your diabetes (diabetes mellitus). It is important to know how many carbohydrates you can safely have in each meal. This is different for every person. A diet and nutrition specialist (registered dietitian) can help you make a meal plan and calculate how many carbohydrates you should have at each meal and snack. Carbohydrates are found in the following foods:  Grains, such as breads and cereals.  Dried beans and soy products.  Starchy vegetables, such as potatoes, peas, and corn.  Fruit and fruit juices.  Milk and  yogurt.  Sweets and snack foods, such as cake, cookies, candy, chips, and soft drinks.  How do I count carbohydrates? There are two ways to count carbohydrates in food. You can use either of the methods or a combination of both. Reading "Nutrition Facts" on packaged food The "Nutrition Facts" list is included on the labels of almost all packaged foods and beverages in the [place] It includes:  The serving size.  Information about nutrients in each serving, including the grams (g) of carbohydrate per serving.  To use the "Nutrition Facts":  Decide how many servings you will have.  Multiply the number of servings by the number of carbohydrates per serving.  The resulting number is the total amount of carbohydrates that you will be having.  Learning standard serving sizes of other foods When you eat foods containing carbohydrates that are not packaged or do not include "Nutrition Facts" on the label, you need to measure the servings in order to count the amount of carbohydrates:  Measure the foods that you will eat with a food scale or measuring cup, if needed.  Decide how many standard-size servings you will eat.  Multiply the number of servings by 15. Most carbohydrate-rich foods have about 15 g of carbohydrates per serving. ? For example, if you eat 8 oz (170 g) of strawberries, you will have eaten 2 servings and 30 g of carbohydrates (2 servings x 15 g = 30 g).  For foods that have more than one food mixed, such as soups and casseroles, you must count the carbohydrates in each food that is included.  The following list contains standard serving sizes of common carbohydrate-rich foods. Each of these servings has about 15 g of carbohydrates:   hamburger bun or  English muffin.   oz (15 mL) syrup.   oz (14 g) jelly.  1 slice of bread.  1 six-inch tortilla.  3 oz (85 g) cooked rice or pasta.  4 oz (113 g) cooked dried beans.  4 oz (113 g) starchy vegetable, such as  peas, corn, or potatoes.  4 oz (113 g) hot cereal.  4 oz (113 g) mashed potatoes or  of a large baked potato.  4 oz (113 g) canned or frozen fruit.  4 oz (120 mL) fruit juice.  4-6 crackers.  6 chicken nuggets.  6 oz (170 g) unsweetened dry cereal.  6 oz (170 g) plain fat-free yogurt or yogurt sweetened with artificial sweeteners.  8 oz (240 mL) milk.  8 oz (170 g) fresh fruit or one small piece of fruit.  24 oz (680 g) popped popcorn.  Example of carbohydrate counting Sample meal  3 oz (85 g) chicken breast.  6 oz (170 g) brown rice.  4 oz (113 g) corn.  8 oz (240 mL) milk.  8 oz (170 g) strawberries with sugar-free whipped topping. Carbohydrate calculation 1. Identify the foods that contain carbohydrates: ? Rice. ? Corn. ? Milk. ?  Strawberries. 2. Calculate how many servings you have of each food: ? 2 servings rice. ? 1 serving corn. ? 1 serving milk. ? 1 serving strawberries. 3. Multiply each number of servings by 15 g: ? 2 servings rice x 15 g = 30 g. ? 1 serving corn x 15 g = 15 g. ? 1 serving milk x 15 g = 15 g. ? 1 serving strawberries x 15 g = 15 g. 4. Add together all of the amounts to find the total grams of carbohydrates eaten: ? 30 g + 15 g + 15 g + 15 g = 75 g of carbohydrates total. This information is not intended to replace advice given to you by your health care provider. Make sure you discuss any questions you have with your health care provider. Document Released: 03/26/2005 Document Revised: 10/14/2015 Document Reviewed: 09/07/2015 Elsevier Interactive Patient Education  2018 [place] Eating Plan DASH stands for "Dietary Approaches to Stop Hypertension." The DASH eating plan is a healthy eating plan that has been shown to reduce high blood pressure (hypertension). It may also reduce your risk for type 2 diabetes, heart disease, and stroke. The DASH eating plan may also help with weight loss. What are tips for following this  plan? General guidelines  Avoid eating more than 2,300 mg (milligrams) of salt (sodium) a day. If you have hypertension, you may need to reduce your sodium intake to 1,500 mg a day.  Limit alcohol intake to no more than 1 drink a day for nonpregnant women and 2 drinks a day for men. One drink equals 12 oz of beer, 5 oz of wine, or 1 oz of hard liquor.  Work with your health care provider to maintain a healthy body weight or to lose weight. Ask what an ideal weight is for you.  Get at least 30 minutes of exercise that causes your heart to beat faster (aerobic exercise) most days of the week. Activities may include walking, swimming, or biking.  Work with your health care provider or diet and nutrition specialist (dietitian) to adjust your eating plan to your individual calorie needs. Reading food labels  Check food labels for the amount of sodium per serving. Choose foods with less than 5 percent of the Daily Value of sodium. Generally, foods with less than 300 mg of sodium per serving fit into this eating plan.  To find whole grains, look for the word "whole" as the first word in the ingredient list. Shopping  Buy products labeled as "low-sodium" or "no salt added."  Buy fresh foods. Avoid canned foods and premade or frozen meals. Cooking  Avoid adding salt when cooking. Use salt-free seasonings or herbs instead of table salt or sea salt. Check with your health care provider or pharmacist before using salt substitutes.  Do not fry foods. Cook foods using healthy methods such as baking, boiling, grilling, and broiling instead.  Cook with heart-healthy oils, such as olive, canola, soybean, or sunflower oil. Meal planning   Eat a balanced diet that includes: ? 5 or more servings of fruits and vegetables each day. At each meal, try to fill half of your plate with fruits and vegetables. ? Up to 6-8 servings of whole grains each day. ? Less than 6 oz of lean meat, poultry, or fish each  day. A 3-oz serving of meat is about the same size as a deck of cards. One egg equals 1 oz. ? 2 servings of low-fat dairy each day. ?  A serving of nuts, seeds, or beans 5 times each week. ? Heart-healthy fats. Healthy fats called Omega-3 fatty acids are found in foods such as flaxseeds and coldwater fish, like sardines, salmon, and mackerel.  Limit how much you eat of the following: ? Canned or prepackaged foods. ? Food that is high in trans fat, such as fried foods. ? Food that is high in saturated fat, such as fatty meat. ? Sweets, desserts, sugary drinks, and other foods with added sugar. ? Full-fat dairy products.  Do not salt foods before eating.  Try to eat at least 2 vegetarian meals each week.  Eat more home-cooked food and less restaurant, buffet, and fast food.  When eating at a restaurant, ask that your food be prepared with less salt or no salt, if possible. What foods are recommended? The items listed may not be a complete list. Talk with your dietitian about what dietary choices are best for you. Grains Whole-grain or whole-wheat bread. Whole-grain or whole-wheat pasta. Brown rice. [place]. Bulgur. Whole-grain and low-sodium cereals. Pita bread. Low-fat, low-sodium crackers. Whole-wheat flour tortillas. Vegetables Fresh or frozen vegetables (raw, steamed, roasted, or grilled). Low-sodium or reduced-sodium tomato and vegetable juice. Low-sodium or reduced-sodium tomato sauce and tomato paste. Low-sodium or reduced-sodium canned vegetables. Fruits All fresh, dried, or frozen fruit. Canned fruit in natural juice (without added sugar). Meat and other protein foods Skinless chicken or [place]. Ground chicken or [place]. Pork with fat trimmed off. Fish and seafood. Egg whites. Dried beans, peas, or lentils. Unsalted nuts, nut butters, and seeds. Unsalted canned beans. Lean cuts of beef with fat trimmed off. Low-sodium, lean deli meat. Dairy Low-fat (1%) or fat-free (skim)  milk. Fat-free, low-fat, or reduced-fat cheeses. Nonfat, low-sodium ricotta or cottage cheese. Low-fat or nonfat yogurt. Low-fat, low-sodium cheese. Fats and oils Soft margarine without trans fats. Vegetable oil. Low-fat, reduced-fat, or light mayonnaise and salad dressings (reduced-sodium). Canola, safflower, olive, soybean, and sunflower oils. Avocado. Seasoning and other foods Herbs. Spices. Seasoning mixes without salt. Unsalted popcorn and pretzels. Fat-free sweets. What foods are not recommended? The items listed may not be a complete list. Talk with your dietitian about what dietary choices are best for you. Grains Baked goods made with fat, such as croissants, muffins, or some breads. Dry pasta or rice meal packs. Vegetables Creamed or fried vegetables. Vegetables in a cheese sauce. Regular canned vegetables (not low-sodium or reduced-sodium). Regular canned tomato sauce and paste (not low-sodium or reduced-sodium). Regular tomato and vegetable juice (not low-sodium or reduced-sodium). Angie Fava. Olives. Fruits Canned fruit in a light or heavy syrup. Fried fruit. Fruit in cream or butter sauce. Meat and other protein foods Fatty cuts of meat. Ribs. Fried meat. Berniece Salines. Sausage. Bologna and other processed lunch meats. Salami. Fatback. Hotdogs. Bratwurst. Salted nuts and seeds. Canned beans with added salt. Canned or smoked fish. Whole eggs or egg yolks. Chicken or [place] with skin. Dairy Whole or 2% milk, cream, and half-and-half. Whole or full-fat cream cheese. Whole-fat or sweetened yogurt. Full-fat cheese. Nondairy creamers. Whipped toppings. Processed cheese and cheese spreads. Fats and oils Butter. Stick margarine. Lard. Shortening. Ghee. Bacon fat. Tropical oils, such as coconut, palm kernel, or palm oil. Seasoning and other foods Salted popcorn and pretzels. Onion salt, garlic salt, seasoned salt, table salt, and sea salt. Worcestershire sauce. Tartar sauce. Barbecue sauce. Teriyaki  sauce. Soy sauce, including reduced-sodium. Steak sauce. Canned and packaged gravies. Fish sauce. Oyster sauce. Cocktail sauce. Horseradish that you find on the shelf. Ketchup.  Mustard. Meat flavorings and tenderizers. Bouillon cubes. Hot sauce and Tabasco sauce. Premade or packaged marinades. Premade or packaged taco seasonings. Relishes. Regular salad dressings. Where to find more information:  National Heart, Lung, and [place]: https://wilson-eaton.com/  American Heart Association: www.heart.org Summary  The DASH eating plan is a healthy eating plan that has been shown to reduce high blood pressure (hypertension). It may also reduce your risk for type 2 diabetes, heart disease, and stroke.  With the DASH eating plan, you should limit salt (sodium) intake to 2,300 mg a day. If you have hypertension, you may need to reduce your sodium intake to 1,500 mg a day.  When on the DASH eating plan, aim to eat more fresh fruits and vegetables, whole grains, lean proteins, low-fat dairy, and heart-healthy fats.  Work with your health care provider or diet and nutrition specialist (dietitian) to adjust your eating plan to your individual calorie needs. This information is not intended to replace advice given to you by your health care provider. Make sure you discuss any questions you have with your health care provider. Document Released: 03/15/2011 Document Revised: 03/19/2016 Document Reviewed: 03/19/2016 Elsevier Interactive Patient Education  [employer].

## 2017-08-20 NOTE — Assessment & Plan Note (Signed)
Blood pressure elevated here today She sometimes forgets 1 of the dose of labetalol Increase labetalol to 300 mg twice daily-stressed the importance of taking this as prescribed Continue amlodipine 10 mg daily We will schedule a follow-up soon and if blood pressure is not better controlled we will need to add a third medication Start regular exercise Low-sodium diet CMP, TSH

## 2017-08-20 NOTE — Assessment & Plan Note (Addendum)
Smoking cessation was discussed for more than 3 minutes.  The patient was counseled on the dangers of tobacco use, and was advised to quit and ready to quit.  Reviewed ways of quitting smoking including nicotine replacement, vapping/e-cigarettes, cold [place], weaning off cigarettes, and pharmacotherapy (wellbutrin and chantix).  She does want to quit, we will try Wellbutrin.  [name] is too expensive.  We will also try to hook her up with nicotine patches.  Discussed that she needs to change some of her habits.  If this is one way that she relieve stress she will need to find another way to relieve her stress.  Follow-up in 3 months sooner if needed

## 2017-08-20 NOTE — Assessment & Plan Note (Signed)
Check lipid panel  Continue daily statin Regular exercise and healthy diet encouraged  

## 2017-08-20 NOTE — Assessment & Plan Note (Signed)
Flared up Will refill hydrocortisone cream, which has been effective Seen gyn, derm if needed

## 2017-08-20 NOTE — Assessment & Plan Note (Signed)
Check A1c Low sugar/carbohydrate diet stressed Start regular exercise Work on weight loss

## 2017-08-20 NOTE — Assessment & Plan Note (Signed)
GERD controlled Continue daily medication  

## 2017-08-21 ENCOUNTER — Encounter: Payer: Self-pay | Admitting: Internal Medicine

## 2017-10-03 DIAGNOSIS — H25013 Cortical age-related cataract, bilateral: Secondary | ICD-10-CM | POA: Diagnosis not present

## 2017-10-03 DIAGNOSIS — H43811 Vitreous degeneration, right eye: Secondary | ICD-10-CM | POA: Diagnosis not present

## 2017-10-03 DIAGNOSIS — H2513 Age-related nuclear cataract, bilateral: Secondary | ICD-10-CM | POA: Diagnosis not present

## 2017-10-03 DIAGNOSIS — H35371 Puckering of macula, right eye: Secondary | ICD-10-CM | POA: Diagnosis not present

## 2017-10-14 ENCOUNTER — Other Ambulatory Visit: Payer: Self-pay | Admitting: Internal Medicine

## 2017-10-30 ENCOUNTER — Other Ambulatory Visit: Payer: Self-pay | Admitting: Internal Medicine

## 2017-10-30 DIAGNOSIS — Z1231 Encounter for screening mammogram for malignant neoplasm of breast: Secondary | ICD-10-CM

## 2017-11-19 NOTE — Progress Notes (Signed)
Subjective:    Patient ID: [name], female    DOB: March 09, 1946, 72 y.o.   MRN: [id number]  HPI The patient is here for follow up.  Two nights ago: she got a stiff neck and took some ibuprofen.  It is better.  She is not concerned about it because it is getting better.  She only typically eats twice a day, but she is now drinking ensure in the morning.  She has lost some weight.    Hypertension: we increased her labetalol three months ago.  She is taking her medication daily. She is compliant with a low sodium diet.  She denies chest pain, palpitations, edema, shortness of breath and regular headaches. She is very active at work, but not exercising regularly.  She does not monitor her blood pressure at home.    Prediabetes:  She is compliant with a low sugar/carbohydrate diet.  She is very active at work, but not exercising regularly.  GERD:  She is taking her medication daily as prescribed.  She denies any GERD symptoms and feels her GERD is well controlled.   Hyperlipidemia: She is taking her medication daily. She is compliant with a low fat/cholesterol diet. She is active but not exercising regularly. She denies myalgias.   Tobacco abuse:  Three months ago we started Wellbutrin, but she took it only for a month - she smoked more with it.  She is still smoking.  She does not want to try any nicotine replacement.  She cannot afford [name].  Medications and allergies reviewed with patient and updated if appropriate.  Patient Active Problem List   Diagnosis Date Noted  . Achilles tendinitis 01/29/2017  . Bilateral carotid artery occlusion 01/29/2017  . Neuropathy 01/28/2017  . Thyromegaly 08/13/2016  . Prediabetes 07/24/2015  . GERD (gastroesophageal reflux disease) 07/20/2015  . History of breast cancer 07/13/2015  . Vitiligo 07/07/2012  . PLANTAR FASCIITIS 06/16/2010  . Tobacco dependence 06/14/2009  . Hyperlipidemia 11/10/2008  . ANGIONEUROTIC EDEMA 01/09/2008  .  CAROTID ARTERY STENOSIS, BILATERAL 06/17/2007  . Essential hypertension 06/12/2007  . Personal history of malignant neoplasm of breast 02/25/2007  . RHEUMATIC FEVER, HX OF 02/21/2007    Current Outpatient Medications on File Prior to Visit  Medication Sig Dispense Refill  . amLODipine (NORVASC) 10 MG tablet TAKE 1 TABLET BY MOUTH EVERY DAY 90 tablet 1  . aspirin EC 81 MG tablet Take 1 tablet (81 mg total) by mouth daily. 90 tablet 3  . atorvastatin (LIPITOR) 20 MG tablet TAKE 1 TABLET BY MOUTH EVERY DAY 90 tablet 3  . hydrocortisone valerate cream (WESTCORT) 0.2 % Apply 1 application topically 2 (two) times daily. 45 g 3  . labetalol (NORMODYNE) 300 MG tablet Take 1 tablet (300 mg total) by mouth 2 (two) times daily. 180 tablet 1  . omeprazole (PRILOSEC) 40 MG capsule TAKE 1 CAPSULE BY MOUTH EVERY DAY 90 capsule 3   No current facility-administered medications on file prior to visit.     Past Medical History:  Diagnosis Date  . Absence of menstruation    amenorrhea  . Angioneurotic edema not elsewhere classified    due to ACE-I  . Breast disorder    breast cancer 2007  . Cancer [facility]) 2007   Left Breast Cancer  . Carotid stenosis    Carotid US (8/15):  RICA 68-34%; LICA 19-62% >>> FU 1 year  . H/O: rheumatic fever   . History of breast cancer 07/13/2015  . HLD (hyperlipidemia)   .  Occlusion and stenosis of carotid artery without mention of cerebral infarction   . Other abnormal glucose    fasting hyperglycemia  . Personal history of malignant neoplasm of breast   . Personal history of radiation therapy 2007  . Unspecified essential hypertension     Past Surgical History:  Procedure Laterality Date  . APPENDECTOMY    . BREAST BIOPSY Left 12/06/2005   malignant  . BREAST LUMPECTOMY Left 01/08/2006  . COLONOSCOPY  2005   negative; Dr Collene Mares  . COLONOSCOPY N/A 09/02/2015   Procedure: COLONOSCOPY;  Surgeon: Danie Binder, MD;  Location: [facility];  Service: Endoscopy;   Laterality: N/A;  1215-moved to 1130 Ginger to notify pt  . G0 P0     Dr. Maudry Diego    Social History   Socioeconomic History  . Marital status: Single    Spouse name: Not on file  . Number of children: Not on file  . Years of education: Not on file  . Highest education level: Not on file  Occupational History  . Not on file  Social Needs  . Financial resource strain: Not hard at all  . Food insecurity:    Worry: Never true    Inability: Never true  . Transportation needs:    Medical: No    Non-medical: No  Tobacco Use  . Smoking status: Current Some Day Smoker    Packs/day: 0.50    Years: 48.00    Pack years: 24.00    Types: Cigarettes  . Smokeless tobacco: Never Used  Substance and Sexual Activity  . Alcohol use: No  . Drug use: No  . Sexual activity: Yes    Birth control/protection: Post-menopausal  Lifestyle  . Physical activity:    Days per week: 0 days    Minutes per session: 0 min  . Stress: Not at all  Relationships  . Social connections:    Talks on phone: More than three times a week    Gets together: More than three times a week    Attends religious service: More than 4 times per year    Active member of club or organization: Yes    Attends meetings of clubs or organizations: More than 4 times per year    Relationship status: Not on file  Other Topics Concern  . Not on file  Social History Narrative   Single, no children.   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     Family History  Problem Relation Age of Onset  . Breast cancer Sister   . Hypertension Unknown        some of Pts brothers and sisters  . Heart attack Neg Hx   . Stroke Neg Hx   . Diabetes Neg Hx   . Heart disease Neg Hx     Review of Systems  Constitutional: Negative for chills and fever.  Respiratory: Positive for wheezing (occ, chronic). Negative for cough and shortness of breath.   Cardiovascular: Negative for chest pain, palpitations and leg swelling.  Neurological:  Negative for light-headedness and headaches.       Objective:   Vitals:   11/20/17 1343  BP: (!) 146/68  Pulse: (!) 58  Resp: 16  Temp: 98.6 F (37 C)  SpO2: 96%   BP Readings from Last 3 Encounters:  11/20/17 (!) 146/68  08/20/17 (!) 180/74  08/20/17 (!) 180/74   Wt Readings from Last 3 Encounters:  11/20/17 203 lb (92.1 kg)  08/20/17 213 lb (  96.6 kg)  08/20/17 213 lb (96.6 kg)   Body mass index is 34.84 kg/m.   Physical Exam    Constitutional: Appears well-developed and well-nourished. No distress.  HENT:  Head: Normocephalic and atraumatic.  Neck: Neck supple. No tracheal deviation present. No thyromegaly present.  No cervical lymphadenopathy Cardiovascular: Normal rate, regular rhythm and normal heart sounds.   1/6 sys murmur heard. No carotid bruit .  No edema Pulmonary/Chest: Effort normal and breath sounds normal. No respiratory distress. No has no wheezes. No rales.  Skin: Skin is warm and dry. Not diaphoretic.  Psychiatric: Normal mood and affect. Behavior is normal.      Assessment & Plan:    See Problem List for Assessment and Plan of chronic medical problems.

## 2017-11-20 ENCOUNTER — Ambulatory Visit (INDEPENDENT_AMBULATORY_CARE_PROVIDER_SITE_OTHER): Payer: Medicare HMO | Admitting: Internal Medicine

## 2017-11-20 ENCOUNTER — Other Ambulatory Visit (INDEPENDENT_AMBULATORY_CARE_PROVIDER_SITE_OTHER): Payer: Medicare HMO

## 2017-11-20 ENCOUNTER — Encounter: Payer: Self-pay | Admitting: Internal Medicine

## 2017-11-20 VITALS — BP 146/68 | HR 58 | Temp 98.6°F | Resp 16 | Wt 203.0 lb

## 2017-11-20 DIAGNOSIS — R7303 Prediabetes: Secondary | ICD-10-CM

## 2017-11-20 DIAGNOSIS — F17200 Nicotine dependence, unspecified, uncomplicated: Secondary | ICD-10-CM

## 2017-11-20 DIAGNOSIS — E785 Hyperlipidemia, unspecified: Secondary | ICD-10-CM | POA: Diagnosis not present

## 2017-11-20 DIAGNOSIS — I10 Essential (primary) hypertension: Secondary | ICD-10-CM | POA: Diagnosis not present

## 2017-11-20 DIAGNOSIS — K219 Gastro-esophageal reflux disease without esophagitis: Secondary | ICD-10-CM | POA: Diagnosis not present

## 2017-11-20 DIAGNOSIS — R69 Illness, unspecified: Secondary | ICD-10-CM | POA: Diagnosis not present

## 2017-11-20 LAB — COMPREHENSIVE METABOLIC PANEL
ALT: 8 U/L (ref 0–35)
AST: 12 U/L (ref 0–37)
Albumin: 4 g/dL (ref 3.5–5.2)
Alkaline Phosphatase: 77 U/L (ref 39–117)
BILIRUBIN TOTAL: 0.7 mg/dL (ref 0.2–1.2)
BUN: 11 mg/dL (ref 6–23)
CALCIUM: 9.4 mg/dL (ref 8.4–10.5)
CO2: 28 meq/L (ref 19–32)
CREATININE: 1.13 mg/dL (ref 0.40–1.20)
Chloride: 103 mEq/L (ref 96–112)
GFR: 61.14 mL/min (ref >60.00)
Glucose, Bld: 102 mg/dL — ABNORMAL HIGH (ref 70–99)
Potassium: 4 mEq/L (ref 3.5–5.1)
Sodium: 138 mEq/L (ref 135–145)
Total Protein: 7.7 g/dL (ref 6.0–8.3)

## 2017-11-20 LAB — LIPID PANEL
Cholesterol: 143 mg/dL (ref 0–200)
HDL: 52.6 mg/dL (ref >39.00)
LDL Cholesterol: 70 mg/dL (ref 0–99)
NonHDL: 90.58
TRIGLYCERIDES: 104 mg/dL (ref 0.0–149.0)
Total CHOL/HDL Ratio: 3
VLDL: 20.8 mg/dL (ref 0.0–40.0)

## 2017-11-20 LAB — HEMOGLOBIN A1C: Hgb A1c MFr Bld: 6.1 % (ref 4.6–6.5)

## 2017-11-20 NOTE — Assessment & Plan Note (Signed)
Check a1c Low sugar / carb diet Stressed regular exercise, weight loss  

## 2017-11-20 NOTE — Assessment & Plan Note (Signed)
Check lipid panel, CMP Continue statin Encouraged regular exercise and healthy diet

## 2017-11-20 NOTE — Assessment & Plan Note (Addendum)
Wellbutrin - made her smoke more - she stopped it after one month Still smoking - discussed other others-cannot afford [name] and does not want to do nicotine replacement Consider cold [place]

## 2017-11-20 NOTE — Assessment & Plan Note (Signed)
GERD controlled Continue daily medication  

## 2017-11-20 NOTE — Assessment & Plan Note (Signed)
BP much better Continue current medications for now cmp

## 2017-11-20 NOTE — Patient Instructions (Signed)
  Test(s) ordered today. Your results will be released to MyChart (or called to you) after review, usually within 72hours after test completion. If any changes need to be made, you will be notified at that same time.  Medications reviewed and updated.  No changes recommended at this time.    Please followup in 6 months   

## 2017-11-21 ENCOUNTER — Encounter: Payer: Self-pay | Admitting: Internal Medicine

## 2017-11-28 ENCOUNTER — Other Ambulatory Visit: Payer: Self-pay

## 2017-11-28 ENCOUNTER — Emergency Department (HOSPITAL_COMMUNITY)
Admission: EM | Admit: 2017-11-28 | Discharge: 2017-11-28 | Disposition: A | Discharge status: Home / Self Care | Payer: Medicare HMO | Attending: Emergency Medicine | Admitting: Emergency Medicine

## 2017-11-28 ENCOUNTER — Encounter (HOSPITAL_COMMUNITY): Payer: Self-pay | Admitting: Emergency Medicine

## 2017-11-28 DIAGNOSIS — Z923 Personal history of irradiation: Secondary | ICD-10-CM | POA: Diagnosis not present

## 2017-11-28 DIAGNOSIS — F17210 Nicotine dependence, cigarettes, uncomplicated: Secondary | ICD-10-CM | POA: Diagnosis not present

## 2017-11-28 DIAGNOSIS — Z853 Personal history of malignant neoplasm of breast: Secondary | ICD-10-CM | POA: Diagnosis not present

## 2017-11-28 DIAGNOSIS — S50862A Insect bite (nonvenomous) of left forearm, initial encounter: Secondary | ICD-10-CM | POA: Insufficient documentation

## 2017-11-28 DIAGNOSIS — R69 Illness, unspecified: Secondary | ICD-10-CM | POA: Diagnosis not present

## 2017-11-28 DIAGNOSIS — W57XXXA Bitten or stung by nonvenomous insect and other nonvenomous arthropods, initial encounter: Secondary | ICD-10-CM | POA: Insufficient documentation

## 2017-11-28 DIAGNOSIS — Y9389 Activity, other specified: Secondary | ICD-10-CM | POA: Insufficient documentation

## 2017-11-28 DIAGNOSIS — Z7982 Long term (current) use of aspirin: Secondary | ICD-10-CM | POA: Insufficient documentation

## 2017-11-28 DIAGNOSIS — L299 Pruritus, unspecified: Secondary | ICD-10-CM | POA: Diagnosis not present

## 2017-11-28 DIAGNOSIS — Y929 Unspecified place or not applicable: Secondary | ICD-10-CM | POA: Insufficient documentation

## 2017-11-28 DIAGNOSIS — R21 Rash and other nonspecific skin eruption: Secondary | ICD-10-CM | POA: Diagnosis not present

## 2017-11-28 DIAGNOSIS — S70261A Insect bite (nonvenomous), right hip, initial encounter: Secondary | ICD-10-CM | POA: Diagnosis not present

## 2017-11-28 DIAGNOSIS — Y998 Other external cause status: Secondary | ICD-10-CM | POA: Diagnosis not present

## 2017-11-28 DIAGNOSIS — Z91010 Allergy to peanuts: Secondary | ICD-10-CM | POA: Diagnosis not present

## 2017-11-28 MED ORDER — PREDNISONE 10 MG PO TABS: ORAL_TABLET | ORAL | 0 refills | Status: DC

## 2017-11-28 NOTE — ED Triage Notes (Signed)
Itching x a week to lt arm and rt buttock.  Does not recall using anything new or new meds.

## 2017-11-28 NOTE — Discharge Instructions (Addendum)
Take the prednisone prescription as directed.  You may apply clear calamine lotion as needed for itching.  You may also take over-the-counter Benadryl 25 mg every 4-6 hours as needed for itching.  Follow-up with your primary care provider or return to the ER for any worsening symptoms such as facial swelling, difficulty swallowing or breathing.

## 2017-11-29 NOTE — ED Provider Notes (Signed)
[facility] EMERGENCY DEPARTMENT Provider Note   CSN: [id number] Arrival date & time: 11/28/17  1508     History   Chief Complaint No chief complaint on file.   HPI [name] is a 72 y.o. female.  HPI   [name] is a 72 y.o. female who presents to the Emergency Department complaining of itching and rash to the left forearm right hip.  Symptoms have been present for 1 week.  She states she noticed a few red "bumps" to her left arm and right hip after standing outside talking to her neighbor.  She denies known insect bite.  She has been applying over-the-counter hydrocortisone cream without relief.  She is also taken Benadryl with minimal relief of the itching.  She denies any new medications, exposures to chemicals or new foods, facial swelling, and swelling of her lips tongue or throat.  No shortness of breath.  Past Medical History:  Diagnosis Date  . Absence of menstruation    amenorrhea  . Angioneurotic edema not elsewhere classified    due to ACE-I  . Breast disorder    breast cancer 2007  . Cancer [facility]) 2007   Left Breast Cancer  . Carotid stenosis    Carotid US (8/15):  RICA 02-58%; LICA 52-77% >>> FU 1 year  . H/O: rheumatic fever   . History of breast cancer 07/13/2015  . HLD (hyperlipidemia)   . Occlusion and stenosis of carotid artery without mention of cerebral infarction   . Other abnormal glucose    fasting hyperglycemia  . Personal history of malignant neoplasm of breast   . Personal history of radiation therapy 2007  . Unspecified essential hypertension     Patient Active Problem List   Diagnosis Date Noted  . Achilles tendinitis 01/29/2017  . Bilateral carotid artery occlusion 01/29/2017  . Neuropathy 01/28/2017  . Thyromegaly 08/13/2016  . Prediabetes 07/24/2015  . GERD (gastroesophageal reflux disease) 07/20/2015  . History of breast cancer 07/13/2015  . Vitiligo 07/07/2012  . PLANTAR FASCIITIS 06/16/2010  . Tobacco dependence 06/14/2009    . Hyperlipidemia 11/10/2008  . ANGIONEUROTIC EDEMA 01/09/2008  . CAROTID ARTERY STENOSIS, BILATERAL 06/17/2007  . Essential hypertension 06/12/2007  . Personal history of malignant neoplasm of breast 02/25/2007  . RHEUMATIC FEVER, HX OF 02/21/2007    Past Surgical History:  Procedure Laterality Date  . APPENDECTOMY    . BREAST BIOPSY Left 12/06/2005   malignant  . BREAST LUMPECTOMY Left 01/08/2006  . COLONOSCOPY  2005   negative; Dr Collene Mares  . COLONOSCOPY N/A 09/02/2015   Procedure: COLONOSCOPY;  Surgeon: Danie Binder, MD;  Location: [facility];  Service: Endoscopy;  Laterality: N/A;  1215-moved to 1130 Ginger to notify pt  . G0 P0     Dr. Maudry Diego     OB History    Gravida  0   Para  0   Term  0   Preterm  0   AB  0   Living  0     SAB  0   TAB  0   Ectopic  0   Multiple  0   Live Births               Home Medications    Prior to Admission medications   Medication Sig Start Date End Date Taking? Authorizing Provider  amLODipine (NORVASC) 10 MG tablet TAKE 1 TABLET BY MOUTH EVERY DAY 10/14/17   Binnie Rail, MD  aspirin EC 81 MG tablet  Take 1 tablet (81 mg total) by mouth daily. 05/23/17   Sherren Mocha, MD  atorvastatin (LIPITOR) 20 MG tablet TAKE 1 TABLET BY MOUTH EVERY DAY 08/19/17   Sherren Mocha, MD  hydrocortisone valerate cream (WESTCORT) 0.2 % Apply 1 application topically 2 (two) times daily. 08/20/17   Binnie Rail, MD  labetalol (NORMODYNE) 300 MG tablet Take 1 tablet (300 mg total) by mouth 2 (two) times daily. 08/20/17   Binnie Rail, MD  omeprazole (PRILOSEC) 40 MG capsule TAKE 1 CAPSULE BY MOUTH EVERY DAY 10/14/17   Binnie Rail, MD  predniSONE (DELTASONE) 10 MG tablet Take 6 tablets day one, 5 tablets day two, 4 tablets day three, 3 tablets day four, 2 tablets day five, then 1 tablet day six 11/28/17   Kem Parkinson, PA-C    Family History Family History  Problem Relation Age of Onset  . Breast cancer Sister   . Hypertension  Unknown        some of Pts brothers and sisters  . Heart attack Neg Hx   . Stroke Neg Hx   . Diabetes Neg Hx   . Heart disease Neg Hx     Social History Social History   Tobacco Use  . Smoking status: Current Some Day Smoker    Packs/day: 0.50    Years: 48.00    Pack years: 24.00    Types: Cigarettes  . Smokeless tobacco: Never Used  Substance Use Topics  . Alcohol use: Yes    Comment: occ  . Drug use: No     Allergies   Benazepril hcl; Peanut-containing drug products; Shellfish allergy; and Yellow dyes (non-tartrazine)   Review of Systems Review of Systems  Constitutional: Negative for activity change, appetite change, chills and fever.  HENT: Negative for facial swelling, sore throat and trouble swallowing.   Respiratory: Negative for chest tightness, shortness of breath and wheezing.   Musculoskeletal: Negative for neck pain and neck stiffness.  Skin: Positive for rash. Negative for wound.  Neurological: Negative for dizziness, weakness, numbness and headaches.     Physical Exam Updated Vital Signs BP (!) 189/75 (BP Location: Right Arm)   Pulse 64   Temp 97.8 F (36.6 C) (Oral)   Resp 18   Ht 5\' 4"  (1.626 m)   Wt 92.1 kg   SpO2 97%   BMI 34.84 kg/m   Physical Exam  Constitutional: She appears well-developed and well-nourished. No distress.  HENT:  Head: Normocephalic and atraumatic.  Mouth/Throat: Uvula is midline, oropharynx is clear and moist and mucous membranes are normal. No oral lesions. No uvula swelling.  Airways patent.  No edema or erythema of the oropharynx.  Uvula is midline and nonedematous.  Neck: Normal range of motion. Neck supple.  Cardiovascular: Normal rate, regular rhythm and intact distal pulses.  No murmur heard. Pulmonary/Chest: Effort normal and breath sounds normal. No respiratory distress.  Musculoskeletal: She exhibits no edema or tenderness.  Lymphadenopathy:    She has no cervical adenopathy.  Neurological: She is  alert. No sensory deficit.  Skin: Skin is warm. Capillary refill takes less than 2 seconds. Rash noted. There is erythema.  Erythematous, slightly raised papules to the mid left forearm and right buttock.  No vesicles or pustules.  No induration.  Psychiatric: She has a normal mood and affect.  Nursing note and vitals reviewed.    ED Treatments / Results  Labs (all labs ordered are listed, but only abnormal results are displayed) Labs Reviewed -  No data to display  EKG None  Radiology No results found.  Procedures Procedures (including critical care time)  Medications Ordered in ED Medications - No data to display   Initial Impression / Assessment and Plan / ED Course  I have reviewed the triage vital signs and the nursing notes.  Pertinent labs & imaging results that were available during my care of the patient were reviewed by me and considered in my medical decision making (see chart for details).     Patient well-appearing.  Focal papules to the forearm and buttock that appear consistent with insect bites.  No concerning symptoms for angioedema.  Doubt infectious process or hives  Patient also seen by Dr. Lacinda Axon  Patient agrees to treatment plan with steroid and will continue Benadryl as needed for itching.  Return precautions discussed.  Final Clinical Impressions(s) / ED Diagnoses   Final diagnoses:  Multiple insect bites    ED Discharge Orders         Ordered    predniSONE (DELTASONE) 10 MG tablet     11/28/17 1620           Kem Parkinson, PA-C 11/29/17 1837    Nat Christen, MD 11/30/17 1109

## 2017-12-13 ENCOUNTER — Ambulatory Visit: Payer: Medicare HMO

## 2017-12-18 ENCOUNTER — Ambulatory Visit
Admission: RE | Admit: 2017-12-18 | Discharge: 2017-12-18 | Disposition: A | Discharge status: Home / Self Care | Payer: Medicare HMO | Source: Ambulatory Visit | Attending: Internal Medicine | Admitting: Internal Medicine

## 2017-12-18 DIAGNOSIS — Z1231 Encounter for screening mammogram for malignant neoplasm of breast: Secondary | ICD-10-CM

## 2018-03-08 ENCOUNTER — Other Ambulatory Visit: Payer: Self-pay | Admitting: Cardiovascular Disease

## 2018-03-08 DIAGNOSIS — I6523 Occlusion and stenosis of bilateral carotid arteries: Secondary | ICD-10-CM

## 2018-03-08 DIAGNOSIS — E785 Hyperlipidemia, unspecified: Secondary | ICD-10-CM

## 2018-03-08 DIAGNOSIS — I10 Essential (primary) hypertension: Secondary | ICD-10-CM

## 2018-03-14 ENCOUNTER — Other Ambulatory Visit: Payer: Self-pay | Admitting: Cardiovascular Disease

## 2018-03-17 NOTE — Telephone Encounter (Signed)
Outpatient Medication Detail    Disp Refills Start End   atorvastatin (LIPITOR) 20 MG tablet 90 tablet 3 08/19/2017    Sig: TAKE 1 TABLET BY MOUTH EVERY DAY   Sent to pharmacy as: atorvastatin (LIPITOR) 20 MG tablet   E-Prescribing Status: Receipt confirmed by pharmacy (08/19/2017 5:42 PM EDT)   Pharmacy   [id number] - [place], [place]

## 2018-05-20 NOTE — Patient Instructions (Addendum)
Monitor your BP at home - keep a log and bring the log with you when you see Dr Burt Knack.   Tests ordered today. Your results will be released to [place] (or called to you) after review, usually within 72hours after test completion. If any changes need to be made, you will be notified at that same time.  All other Health Maintenance issues reviewed.   All recommended immunizations and age-appropriate screenings are up-to-date or discussed.  No immunizations administered today.   Medications reviewed and updated.  Changes include :   none  Your prescription(s) have been submitted to your pharmacy. Please take as directed and contact our office if you believe you are having problem(s) with the medication(s).   Please followup in 6 months   Health Maintenance, Female Adopting a healthy lifestyle and getting preventive care can go a long way to promote health and wellness. Talk with your health care provider about what schedule of regular examinations is right for you. This is a good chance for you to check in with your provider about disease prevention and staying healthy. In between checkups, there are plenty of things you can do on your own. Experts have done a lot of research about which lifestyle changes and preventive measures are most likely to keep you healthy. Ask your health care provider for more information. Weight and diet Eat a healthy diet  Be sure to include plenty of vegetables, fruits, low-fat dairy products, and lean protein.  Do not eat a lot of foods high in solid fats, added sugars, or salt.  Get regular exercise. This is one of the most important things you can do for your health. ? Most adults should exercise for at least 150 minutes each week. The exercise should increase your heart rate and make you sweat (moderate-intensity exercise). ? Most adults should also do strengthening exercises at least twice a week. This is in addition to the moderate-intensity  exercise. Maintain a healthy weight  Body mass index (BMI) is a measurement that can be used to identify possible weight problems. It estimates body fat based on height and weight. Your health care provider can help determine your BMI and help you achieve or maintain a healthy weight.  For females 63 years of age and older: ? A BMI below 18.5 is considered underweight. ? A BMI of 18.5 to 24.9 is normal. ? A BMI of 25 to 29.9 is considered overweight. ? A BMI of 30 and above is considered obese. Watch levels of cholesterol and blood lipids  You should start having your blood tested for lipids and cholesterol at 71 years of age, then have this test every 5 years.  You may need to have your cholesterol levels checked more often if: ? Your lipid or cholesterol levels are high. ? You are older than 71 years of age. ? You are at high risk for heart disease. Cancer screening Lung Cancer  Lung cancer screening is recommended for adults 14-18 years old who are at high risk for lung cancer because of a history of smoking.  A yearly low-dose CT scan of the lungs is recommended for people who: ? Currently smoke. ? Have quit within the past 15 years. ? Have at least a 30-pack-year history of smoking. A pack year is smoking an average of one pack of cigarettes a day for 1 year.  Yearly screening should continue until it has been 15 years since you quit.  Yearly screening should stop if you develop  a health problem that would prevent you from having lung cancer treatment. Breast Cancer  Practice breast self-awareness. This means understanding how your breasts normally appear and feel.  It also means doing regular breast self-exams. Let your health care provider know about any changes, no matter how small.  If you are in your 20s or 30s, you should have a clinical breast exam (CBE) by a health care provider every 1-3 years as part of a regular health exam.  If you are 1 or older, have a CBE  every year. Also consider having a breast X-ray (mammogram) every year.  If you have a family history of breast cancer, talk to your health care provider about genetic screening.  If you are at high risk for breast cancer, talk to your health care provider about having an MRI and a mammogram every year.  Breast cancer gene (BRCA) assessment is recommended for women who have family members with BRCA-related cancers. BRCA-related cancers include: ? Breast. ? Ovarian. ? Tubal. ? Peritoneal cancers.  Results of the assessment will determine the need for genetic counseling and BRCA1 and BRCA2 testing. Cervical Cancer Your health care provider may recommend that you be screened regularly for cancer of the pelvic organs (ovaries, uterus, and vagina). This screening involves a pelvic examination, including checking for microscopic changes to the surface of your cervix (Pap test). You may be encouraged to have this screening done every 3 years, beginning at age 35.  For women ages 76-65, health care providers may recommend pelvic exams and Pap testing every 3 years, or they may recommend the Pap and pelvic exam, combined with testing for human papilloma virus (HPV), every 5 years. Some types of HPV increase your risk of cervical cancer. Testing for HPV may also be done on women of any age with unclear Pap test results.  Other health care providers may not recommend any screening for nonpregnant women who are considered low risk for pelvic cancer and who do not have symptoms. Ask your health care provider if a screening pelvic exam is right for you.  If you have had past treatment for cervical cancer or a condition that could lead to cancer, you need Pap tests and screening for cancer for at least 20 years after your treatment. If Pap tests have been discontinued, your risk factors (such as having a new sexual partner) need to be reassessed to determine if screening should resume. Some women have medical  problems that increase the chance of getting cervical cancer. In these cases, your health care provider may recommend more frequent screening and Pap tests. Colorectal Cancer  This type of cancer can be detected and often prevented.  Routine colorectal cancer screening usually begins at 71 years of age and continues through 71 years of age.  Your health care provider may recommend screening at an earlier age if you have risk factors for colon cancer.  Your health care provider may also recommend using home test kits to check for hidden blood in the stool.  A small camera at the end of a tube can be used to examine your colon directly (sigmoidoscopy or colonoscopy). This is done to check for the earliest forms of colorectal cancer.  Routine screening usually begins at age 47.  Direct examination of the colon should be repeated every 5-10 years through 71 years of age. However, you may need to be screened more often if early forms of precancerous polyps or small growths are found. Skin Cancer  Check  your skin from head to toe regularly.  Tell your health care provider about any new moles or changes in moles, especially if there is a change in a mole's shape or color.  Also tell your health care provider if you have a mole that is larger than the size of a pencil eraser.  Always use sunscreen. Apply sunscreen liberally and repeatedly throughout the day.  Protect yourself by wearing long sleeves, pants, a wide-brimmed hat, and sunglasses whenever you are outside. Heart disease, diabetes, and high blood pressure  High blood pressure causes heart disease and increases the risk of stroke. High blood pressure is more likely to develop in: ? People who have blood pressure in the high end of the normal range (130-139/85-89 mm Hg). ? People who are overweight or obese. ? People who are African American.  If you are 12-25 years of age, have your blood pressure checked every 3-5 years. If you  are 2 years of age or older, have your blood pressure checked every year. You should have your blood pressure measured twice-once when you are at a hospital or clinic, and once when you are not at a hospital or clinic. Record the average of the two measurements. To check your blood pressure when you are not at a hospital or clinic, you can use: ? An automated blood pressure machine at a pharmacy. ? A home blood pressure monitor.  If you are between 70 years and 13 years old, ask your health care provider if you should take aspirin to prevent strokes.  Have regular diabetes screenings. This involves taking a blood sample to check your fasting blood sugar level. ? If you are at a normal weight and have a low risk for diabetes, have this test once every three years after 71 years of age. ? If you are overweight and have a high risk for diabetes, consider being tested at a younger age or more often. Preventing infection Hepatitis B  If you have a higher risk for hepatitis B, you should be screened for this virus. You are considered at high risk for hepatitis B if: ? You were born in a country where hepatitis B is common. Ask your health care provider which countries are considered high risk. ? Your parents were born in a high-risk country, and you have not been immunized against hepatitis B (hepatitis B vaccine). ? You have HIV or AIDS. ? You use needles to inject street drugs. ? You live with someone who has hepatitis B. ? You have had sex with someone who has hepatitis B. ? You get hemodialysis treatment. ? You take certain medicines for conditions, including cancer, organ transplantation, and autoimmune conditions. Hepatitis C  Blood testing is recommended for: ? Everyone born from [birth year] through 1963. ? Anyone with known risk factors for hepatitis C. Sexually transmitted infections (STIs)  You should be screened for sexually transmitted infections (STIs) including gonorrhea and chlamydia  if: ? You are sexually active and are younger than 71 years of age. ? You are older than 71 years of age and your health care provider tells you that you are at risk for this type of infection. ? Your sexual activity has changed since you were last screened and you are at an increased risk for chlamydia or gonorrhea. Ask your health care provider if you are at risk.  If you do not have HIV, but are at risk, it may be recommended that you take a prescription medicine daily to prevent  HIV infection. This is called pre-exposure prophylaxis (PrEP). You are considered at risk if: ? You are sexually active and do not regularly use condoms or know the HIV status of your partner(s). ? You take drugs by injection. ? You are sexually active with a partner who has HIV. Talk with your health care provider about whether you are at high risk of being infected with HIV. If you choose to begin PrEP, you should first be tested for HIV. You should then be tested every 3 months for as long as you are taking PrEP. Pregnancy  If you are premenopausal and you may become pregnant, ask your health care provider about preconception counseling.  If you may become pregnant, take 400 to 800 micrograms (mcg) of folic acid every day.  If you want to prevent pregnancy, talk to your health care provider about birth control (contraception). Osteoporosis and menopause  Osteoporosis is a disease in which the bones lose minerals and strength with aging. This can result in serious bone fractures. Your risk for osteoporosis can be identified using a bone density scan.  If you are 48 years of age or older, or if you are at risk for osteoporosis and fractures, ask your health care provider if you should be screened.  Ask your health care provider whether you should take a calcium or vitamin D supplement to lower your risk for osteoporosis.  Menopause may have certain physical symptoms and risks.  Hormone replacement therapy may  reduce some of these symptoms and risks. Talk to your health care provider about whether hormone replacement therapy is right for you. Follow these instructions at home:  Schedule regular health, dental, and eye exams.  Stay current with your immunizations.  Do not use any tobacco products including cigarettes, chewing tobacco, or electronic cigarettes.  If you are pregnant, do not drink alcohol.  If you are breastfeeding, limit how much and how often you drink alcohol.  Limit alcohol intake to no more than 1 drink per day for nonpregnant women. One drink equals 12 ounces of beer, 5 ounces of wine, or 1 ounces of hard liquor.  Do not use street drugs.  Do not share needles.  Ask your health care provider for help if you need support or information about quitting drugs.  Tell your health care provider if you often feel depressed.  Tell your health care provider if you have ever been abused or do not feel safe at home. This information is not intended to replace advice given to you by your health care provider. Make sure you discuss any questions you have with your health care provider. Document Released: 10/09/2010 Document Revised: 09/01/2015 Document Reviewed: 12/28/2014 Elsevier Interactive Patient Education  2019 [employer].

## 2018-05-20 NOTE — Progress Notes (Signed)
Subjective:    Patient ID: [name], female    DOB: March 18, 1946, 73 y.o.   MRN: [id number]  HPI She is here for a physical exam.   He just got over a cold.  She slept wrong last night and has a stiff, mildly painful right side of her neck.   Her female friend died last fall from cancer.  She feels she is dealing with it well.  She is okay with being alone and does not feel the need to have another companion.  Overall she feels good and has no major concerns.  Medications and allergies reviewed with patient and updated if appropriate.  Patient Active Problem List   Diagnosis Date Noted  . Achilles tendinitis 01/29/2017  . Bilateral carotid artery occlusion 01/29/2017  . Neuropathy 01/28/2017  . Thyromegaly 08/13/2016  . Prediabetes 07/24/2015  . GERD (gastroesophageal reflux disease) 07/20/2015  . History of breast cancer 07/13/2015  . Vitiligo 07/07/2012  . PLANTAR FASCIITIS 06/16/2010  . Tobacco dependence 06/14/2009  . Hyperlipidemia 11/10/2008  . ANGIONEUROTIC EDEMA 01/09/2008  . CAROTID ARTERY STENOSIS, BILATERAL 06/17/2007  . Essential hypertension 06/12/2007  . Personal history of malignant neoplasm of breast 02/25/2007  . RHEUMATIC FEVER, HX OF 02/21/2007    Current Outpatient Medications on File Prior to Visit  Medication Sig Dispense Refill  . amLODipine (NORVASC) 10 MG tablet TAKE 1 TABLET BY MOUTH EVERY DAY 90 tablet 1  . aspirin EC 81 MG tablet Take 1 tablet (81 mg total) by mouth daily. 90 tablet 3  . atorvastatin (LIPITOR) 20 MG tablet TAKE 1 TABLET BY MOUTH EVERY DAY 90 tablet 3  . hydrocortisone valerate cream (WESTCORT) 0.2 % Apply 1 application topically 2 (two) times daily. 45 g 3  . labetalol (NORMODYNE) 300 MG tablet Take 1 tablet (300 mg total) by mouth 2 (two) times daily. 180 tablet 1  . omeprazole (PRILOSEC) 40 MG capsule TAKE 1 CAPSULE BY MOUTH EVERY DAY 90 capsule 3   No current facility-administered medications on file prior to visit.      Past Medical History:  Diagnosis Date  . Absence of menstruation    amenorrhea  . Angioneurotic edema not elsewhere classified    due to ACE-I  . Breast disorder    breast cancer 2007  . Cancer [facility]) 2007   Left Breast Cancer  . Carotid stenosis    Carotid US (8/15):  RICA 93-26%; LICA 71-24% >>> FU 1 year  . H/O: rheumatic fever   . History of breast cancer 07/13/2015  . HLD (hyperlipidemia)   . Occlusion and stenosis of carotid artery without mention of cerebral infarction   . Other abnormal glucose    fasting hyperglycemia  . Personal history of malignant neoplasm of breast   . Personal history of radiation therapy 2007  . Unspecified essential hypertension     Past Surgical History:  Procedure Laterality Date  . APPENDECTOMY    . BREAST BIOPSY Left 12/06/2005   malignant  . BREAST LUMPECTOMY Left 01/08/2006  . COLONOSCOPY  2005   negative; Dr Collene Mares  . COLONOSCOPY N/A 09/02/2015   Procedure: COLONOSCOPY;  Surgeon: Danie Binder, MD;  Location: [facility];  Service: Endoscopy;  Laterality: N/A;  1215-moved to 1130 [address] to notify pt  . G0 P0     Dr. Maudry Diego    Social History   Socioeconomic History  . Marital status: Single    Spouse name: Not on file  . Number of children:  Not on file  . Years of education: Not on file  . Highest education level: Not on file  Occupational History  . Not on file  Social Needs  . Financial resource strain: Not hard at all  . Food insecurity:    Worry: Never true    Inability: Never true  . Transportation needs:    Medical: No    Non-medical: No  Tobacco Use  . Smoking status: Current Some Day Smoker    Packs/day: 0.50    Years: 48.00    Pack years: 24.00    Types: Cigarettes  . Smokeless tobacco: Never Used  Substance and Sexual Activity  . Alcohol use: Yes    Comment: occ  . Drug use: No  . Sexual activity: Yes    Birth control/protection: Post-menopausal  Lifestyle  . Physical activity:    Days per week:  0 days    Minutes per session: 0 min  . Stress: Not at all  Relationships  . Social connections:    Talks on phone: More than three times a week    Gets together: More than three times a week    Attends religious service: More than 4 times per year    Active member of club or organization: Yes    Attends meetings of clubs or organizations: More than 4 times per year    Relationship status: Not on file  Other Topics Concern  . Not on file  Social History Narrative   Single, no children.   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     Family History  Problem Relation Age of Onset  . Breast cancer Sister   . Hypertension Unknown        some of Pts brothers and sisters  . Heart attack Neg Hx   . Stroke Neg Hx   . Diabetes Neg Hx   . Heart disease Neg Hx     Review of Systems  Constitutional: Negative for chills and fever.  Eyes: Negative for visual disturbance.  Respiratory: Positive for wheezing (Occasionally at night). Negative for cough and shortness of breath.   Cardiovascular: Negative for chest pain, palpitations and leg swelling.  Gastrointestinal: Negative for abdominal pain, blood in stool, constipation, diarrhea and nausea.       No gerd  Genitourinary: Negative for dysuria and hematuria.  Musculoskeletal: Negative for arthralgias and back pain.  Skin: Negative for color change and rash.  Neurological: Negative for light-headedness and headaches.  Psychiatric/Behavioral: Negative for dysphoric mood. The patient is not nervous/anxious.        Objective:   Vitals:   05/21/18 1357  BP: (!) 164/68  Pulse: 61  Resp: 16  Temp: 98.6 F (37 C)  SpO2: 97%   Filed Weights   05/21/18 1357  Weight: 195 lb (88.5 kg)   Body mass index is 33.47 kg/m.  BP Readings from Last 3 Encounters:  05/21/18 (!) 164/68  11/28/17 (!) 189/75  11/20/17 (!) 146/68    Wt Readings from Last 3 Encounters:  05/21/18 195 lb (88.5 kg)  11/28/17 203 lb (92.1 kg)  11/20/17  203 lb (92.1 kg)     Physical Exam Constitutional: She appears well-developed and well-nourished. No distress.  HENT:  Head: Normocephalic and atraumatic.  Right Ear: External ear normal. Normal ear canal and TM Left Ear: External ear normal.  Normal ear canal and TM Mouth/Throat: Oropharynx is clear and moist.  Eyes: Conjunctivae and EOM are normal.  Neck: Neck supple.  No tracheal deviation present. No thyromegaly present.  No carotid bruit  Cardiovascular: Normal rate, regular rhythm and normal heart sounds.   No murmur heard.  No edema. Pulmonary/Chest: Effort normal and breath sounds normal. No respiratory distress. She has no wheezes. She has no rales.  Breast: deferred  Abdominal: Soft. She exhibits no distension. There is no tenderness.  Lymphadenopathy: She has no cervical adenopathy.  Skin: Skin is warm and dry. She is not diaphoretic.  Psychiatric: She has a normal mood and affect. Her behavior is normal.        Assessment & Plan:   Physical exam: Screening blood work  ordered Immunizations discussed tdap and shingrix - deferred both Colonoscopy  Up to date  Mammogram   Up to date  Dexa      Due - odered Eye exams   Up to date  EKG  05/2017 Exercise  Active, not regular exercise  -- still working Weight  Encouraged weight loss Skin   No concerns Substance abuse still smoking, no other abuse  See Problem List for Assessment and Plan of chronic medical problems.  Follow-up in 6 months

## 2018-05-21 ENCOUNTER — Ambulatory Visit (INDEPENDENT_AMBULATORY_CARE_PROVIDER_SITE_OTHER): Payer: Medicare HMO | Admitting: Internal Medicine

## 2018-05-21 ENCOUNTER — Encounter: Payer: Self-pay | Admitting: Internal Medicine

## 2018-05-21 ENCOUNTER — Other Ambulatory Visit (INDEPENDENT_AMBULATORY_CARE_PROVIDER_SITE_OTHER): Payer: Medicare HMO

## 2018-05-21 VITALS — BP 164/68 | HR 61 | Temp 98.6°F | Resp 16 | Ht 64.0 in | Wt 195.0 lb

## 2018-05-21 DIAGNOSIS — K219 Gastro-esophageal reflux disease without esophagitis: Secondary | ICD-10-CM | POA: Diagnosis not present

## 2018-05-21 DIAGNOSIS — E2839 Other primary ovarian failure: Secondary | ICD-10-CM

## 2018-05-21 DIAGNOSIS — R7303 Prediabetes: Secondary | ICD-10-CM

## 2018-05-21 DIAGNOSIS — G629 Polyneuropathy, unspecified: Secondary | ICD-10-CM | POA: Diagnosis not present

## 2018-05-21 DIAGNOSIS — I10 Essential (primary) hypertension: Secondary | ICD-10-CM

## 2018-05-21 DIAGNOSIS — Z0000 Encounter for general adult medical examination without abnormal findings: Secondary | ICD-10-CM | POA: Diagnosis not present

## 2018-05-21 DIAGNOSIS — E785 Hyperlipidemia, unspecified: Secondary | ICD-10-CM

## 2018-05-21 DIAGNOSIS — Z13820 Encounter for screening for osteoporosis: Secondary | ICD-10-CM

## 2018-05-21 LAB — CBC WITH DIFFERENTIAL/PLATELET
BASOS ABS: 0.1 10*3/uL (ref 0.0–0.1)
Basophils Relative: 0.6 % (ref 0.0–3.0)
EOS ABS: 0.1 10*3/uL (ref 0.0–0.7)
Eosinophils Relative: 1.4 % (ref 0.0–5.0)
HEMATOCRIT: 42 % (ref 36.0–46.0)
Hemoglobin: 13.7 g/dL (ref 12.0–15.0)
LYMPHS ABS: 4.1 10*3/uL — AB (ref 0.7–4.0)
LYMPHS PCT: 42.6 % (ref 12.0–46.0)
MCHC: 32.6 g/dL (ref 30.0–36.0)
MCV: 85.5 fl (ref 78.0–100.0)
Monocytes Absolute: 0.6 10*3/uL (ref 0.1–1.0)
Monocytes Relative: 6.1 % (ref 3.0–12.0)
NEUTROS ABS: 4.8 10*3/uL (ref 1.4–7.7)
NEUTROS PCT: 49.3 % (ref 43.0–77.0)
PLATELETS: 284 10*3/uL (ref 150.0–400.0)
RBC: 4.92 Mil/uL (ref 3.87–5.11)
RDW: 14.4 % (ref 11.5–15.5)
WBC: 9.7 10*3/uL (ref 4.0–10.5)

## 2018-05-21 LAB — COMPREHENSIVE METABOLIC PANEL
ALBUMIN: 4 g/dL (ref 3.5–5.2)
ALT: 7 U/L (ref 0–35)
AST: 9 U/L (ref 0–37)
Alkaline Phosphatase: 93 U/L (ref 39–117)
BILIRUBIN TOTAL: 0.3 mg/dL (ref 0.2–1.2)
BUN: 19 mg/dL (ref 6–23)
CHLORIDE: 104 meq/L (ref 96–112)
CO2: 28 mEq/L (ref 19–32)
CREATININE: 0.78 mg/dL (ref 0.40–1.20)
Calcium: 9.4 mg/dL (ref 8.4–10.5)
GFR: 88.1 mL/min (ref >60.00)
GLUCOSE: 95 mg/dL (ref 70–99)
POTASSIUM: 3.8 meq/L (ref 3.5–5.1)
SODIUM: 140 meq/L (ref 135–145)
Total Protein: 7.7 g/dL (ref 6.0–8.3)

## 2018-05-21 LAB — LIPID PANEL
CHOLESTEROL: 195 mg/dL (ref 0–200)
HDL: 40.4 mg/dL (ref >39.00)
LDL CALC: 119 mg/dL — AB (ref 0–99)
NONHDL: 154.55
Total CHOL/HDL Ratio: 5
Triglycerides: 177 mg/dL — ABNORMAL HIGH (ref 0.0–149.0)
VLDL: 35.4 mg/dL (ref 0.0–40.0)

## 2018-05-21 LAB — TSH: TSH: 1.44 u[IU]/mL (ref 0.35–4.50)

## 2018-05-21 LAB — HEMOGLOBIN A1C: Hgb A1c MFr Bld: 5.9 % (ref 4.6–6.5)

## 2018-05-21 MED ORDER — AMLODIPINE BESYLATE 10 MG PO TABS: 10.0000 mg | ORAL_TABLET | Freq: Every day | ORAL | 1 refills | Status: DC

## 2018-05-21 MED ORDER — LABETALOL HCL 300 MG PO TABS: 300.0000 mg | ORAL_TABLET | Freq: Two times a day (BID) | ORAL | 1 refills | Status: DC

## 2018-05-21 NOTE — Assessment & Plan Note (Signed)
Check lipid panel  Continue daily statin Regular exercise and healthy diet encouraged  

## 2018-05-21 NOTE — Assessment & Plan Note (Signed)
Check a1c Low sugar / carb diet Stressed regular exercise   

## 2018-05-21 NOTE — Assessment & Plan Note (Signed)
Blood pressure elevated here today and has been on the higher side She does not monitor blood pressure at home She will start monitoring her blood pressure or have a family member do it She needs to schedule follow-up with cardiology-recommended bringing a list of her blood pressures with her or call if her blood pressure is not controlled CMP

## 2018-05-21 NOTE — Assessment & Plan Note (Signed)
GERD controlled Continue daily medication  

## 2018-05-21 NOTE — Assessment & Plan Note (Signed)
Not currently taking gabapentin, but has taken gabapentin in the past for neuropathy of left breast She will let me know if she needs to restart this

## 2018-05-28 DIAGNOSIS — Z124 Encounter for screening for malignant neoplasm of cervix: Secondary | ICD-10-CM | POA: Diagnosis not present

## 2018-06-10 ENCOUNTER — Encounter: Payer: Self-pay | Admitting: Cardiovascular Disease

## 2018-06-11 ENCOUNTER — Encounter: Payer: Self-pay | Admitting: Cardiovascular Disease

## 2018-06-11 ENCOUNTER — Ambulatory Visit (INDEPENDENT_AMBULATORY_CARE_PROVIDER_SITE_OTHER): Payer: Medicare HMO | Admitting: Cardiovascular Disease

## 2018-06-11 VITALS — BP 140/82 | HR 56 | Ht 64.0 in | Wt 198.4 lb

## 2018-06-11 DIAGNOSIS — Z720 Tobacco use: Secondary | ICD-10-CM | POA: Diagnosis not present

## 2018-06-11 DIAGNOSIS — E782 Mixed hyperlipidemia: Secondary | ICD-10-CM

## 2018-06-11 DIAGNOSIS — I10 Essential (primary) hypertension: Secondary | ICD-10-CM | POA: Diagnosis not present

## 2018-06-11 DIAGNOSIS — I6523 Occlusion and stenosis of bilateral carotid arteries: Secondary | ICD-10-CM

## 2018-06-11 MED ORDER — ATORVASTATIN CALCIUM 20 MG PO TABS: 20.0000 mg | ORAL_TABLET | Freq: Every day | ORAL | 3 refills | Status: DC

## 2018-06-11 NOTE — Progress Notes (Signed)
Cardiology Office Note:    Date:  06/11/2018   ID:  [name], DOB 08/01/1945, MRN [id number]  PCP:  Binnie Rail, MD  Cardiologist:  Sherren Mocha, MD  Electrophysiologist:  None   Referring MD: Binnie Rail, MD   Chief Complaint  Patient presents with  . Shortness of Breath   History of Present Illness:    [name] is a 73 y.o. female with a hx of HTN, tobacco abuse, mixed hyperlipidemia, and carotid stenosis. She has had mild bilateral carotid atherosclerosis without flow-limiting stenosis by serial duplex exams.  The patient is here alone today.  She is doing fairly well.  She has mild shortness of breath with physical activity.  Stairs.  She denies orthopnea, PND, or leg swelling.  She was able to quit for about 4 months last year but she restarted when she went with some stressful things in her life.  No chest pain or pressure.  She has no other Past Medical History:  Diagnosis Date  . Absence of menstruation    amenorrhea  . Angioneurotic edema not elsewhere classified    due to ACE-I  . Breast disorder    breast cancer 2007  . Cancer [facility]) 2007   Left Breast Cancer  . Carotid stenosis    Carotid US (8/15):  RICA 62-94%; LICA 76-54% >>> FU 1 year  . H/O: rheumatic fever   . History of breast cancer 07/13/2015  . HLD (hyperlipidemia)   . Occlusion and stenosis of carotid artery without mention of cerebral infarction   . Other abnormal glucose    fasting hyperglycemia  . Personal history of malignant neoplasm of breast   . Personal history of radiation therapy 2007  . Unspecified essential hypertension     Past Surgical History:  Procedure Laterality Date  . APPENDECTOMY    . BREAST BIOPSY Left 12/06/2005   malignant  . BREAST LUMPECTOMY Left 01/08/2006  . COLONOSCOPY  2005   negative; Dr Collene Mares  . COLONOSCOPY N/A 09/02/2015   Procedure: COLONOSCOPY;  Surgeon: Danie Binder, MD;  [facility];  Service: Endoscopy;  Laterality: N/A;   1215-moved to 1130 Ginger to notify pt  . G0 P0     Dr. Maudry Diego    Current Medications: Current Meds  Medication Sig  . amLODipine (NORVASC) 10 MG tablet Take 1 tablet (10 mg total) by mouth daily.  Marland Kitchen aspirin EC 81 MG tablet Take 1 tablet (81 mg total) by mouth daily.  Marland Kitchen atorvastatin (LIPITOR) 20 MG tablet Take 1 tablet (20 mg total) by mouth daily.  Marland Kitchen labetalol (NORMODYNE) 300 MG tablet Take 1 tablet (300 mg total) by mouth 2 (two) times daily.  Marland Kitchen omeprazole (PRILOSEC) 40 MG capsule TAKE 1 CAPSULE BY MOUTH EVERY DAY  . [DISCONTINUED] atorvastatin (LIPITOR) 20 MG tablet TAKE 1 TABLET BY MOUTH EVERY DAY     Allergies:   Benazepril hcl; Peanut-containing drug products; Shellfish allergy; and Yellow dyes (non-tartrazine)   Social History   Socioeconomic History  . Marital status: Single    Spouse name: Not on file  . Number of children: Not on file  . Years of education: Not on file  . Highest education level: Not on file  Occupational History  . Not on file  Social Needs  . Financial resource strain: Not hard at all  . Food insecurity:    Worry: Never true    Inability: Never true  . Transportation needs:    Medical: No  Non-medical: No  Tobacco Use  . Smoking status: Current Some Day Smoker    Packs/day: 0.50    Years: 48.00    Pack years: 24.00    Types: Cigarettes  . Smokeless tobacco: Never Used  Substance and Sexual Activity  . Alcohol use: Yes    Comment: occ  . Drug use: No  . Sexual activity: Yes    Birth control/protection: Post-menopausal  Lifestyle  . Physical activity:    Days per week: 0 days    Minutes per session: 0 min  . Stress: Not at all  Relationships  . Social connections:    Talks on phone: More than three times a week    Gets together: More than three times a week    Attends religious service: More than 4 times per year    Active member of club or organization: Yes    Attends meetings of clubs or organizations: More than 4 times per  year    Relationship status: Not on file  Other Topics Concern  . Not on file  Social History Narrative   Single, no children.   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      Family History: The patient's family history includes Breast cancer in her sister; Hypertension in an other family member. There is no history of Heart attack, Stroke, Diabetes, or Heart disease.  ROS:   Please see the history of present illness.    Positive for wheezing.  All other systems reviewed and are negative.  EKGs/Labs/Other Studies Reviewed:    The following studies were reviewed today: Carotid duplex scan 05/07/2017: Final Interpretation: Right Carotid: Velocities in the right ICA are consistent with a 1-39% stenosis.  Left Carotid: Velocities in the left ICA are consistent with a 1-39% stenosis.               Non-hemodynamically significant plaque noted in the CCA.  Vertebrals:  Both vertebral arteries were patent with antegrade flow. Subclavians: Normal flow hemodynamics were seen in bilateral subclavian              arteries.  EKG:  EKG is ordered today.  The ekg ordered today demonstrates sinus bradycardia 56 bpm, otherwise within normal limits.  Recent Labs: 05/21/2018: ALT 7; BUN 19; Creatinine, Ser 0.78; Hemoglobin 13.7; Platelets 284.0; Potassium 3.8; Sodium 140; TSH 1.44  Recent Lipid Panel    Component Value Date/Time   CHOL 195 05/21/2018 1500   CHOL 168 05/18/2016 0844   TRIG 177.0 (H) 05/21/2018 1500   HDL 40.40 05/21/2018 1500   HDL 49 05/18/2016 0844   CHOLHDL 5 05/21/2018 1500   VLDL 35.4 05/21/2018 1500   LDLCALC 119 (H) 05/21/2018 1500   LDLCALC 100 (H) 05/18/2016 0844   LDLDIRECT 141.8 07/31/2012 1537    Physical Exam:    VS:  BP 140/82   Pulse (!) 56   Ht 5\' 4"  (1.626 m)   Wt 198 lb 6.4 oz (90 kg)   SpO2 96%   BMI 34.06 kg/m     Wt Readings from Last 3 Encounters:  06/11/18 198 lb 6.4 oz (90 kg)  05/21/18 195 lb (88.5 kg)  11/28/17 203 lb (92.1 kg)       GEN: Well nourished, well developed in no acute distress HEENT: Normal NECK: No JVD; bilateral carotid bruits LYMPHATICS: No lymphadenopathy CARDIAC: RRR, no murmurs, rubs, gallops RESPIRATORY:  Clear to auscultation without rales, wheezing or rhonchi  ABDOMEN: Soft, non-tender, non-distended MUSCULOSKELETAL:  No  edema; No deformity  SKIN: Warm and dry NEUROLOGIC:  Alert and oriented x 3 PSYCHIATRIC:  Normal affect   ASSESSMENT:    1. Bilateral carotid artery stenosis   2. Essential hypertension   3. Mixed hyperlipidemia   4. Tobacco abuse    PLAN:    In order of problems listed above:  1. The patient had minimal carotid stenosis at the time of her last evaluation.  We continue with risk reduction measures.  She is treated with aspirin and a statin drug.  We discussed tobacco cessation. 2. Blood pressure is improved.  I demonstrated some blood pressure cuffs on the market have recommended an Omron device.  She has been very inconsistent in taking her antihypertensive medicines.  She currently is taking amlodipine.  He is not sure if she is actually taking labetalol or not.  She is counseled about the importance of medication adherence. 3. The patient's last LDL cholesterol was above goal.  She admits that she has not been taking atorvastatin as she ran out of it.  Refills are written and she is again reminded about the importance of medication adherence. 4. Smoking cessation counseling is done.   Medication Adjustments/Labs and Tests Ordered: Current medicines are reviewed at length with the patient today.  Concerns regarding medicines are outlined above.  Orders Placed This Encounter  Procedures  . EKG 12-Lead   Meds ordered this encounter  Medications  . atorvastatin (LIPITOR) 20 MG tablet    Sig: Take 1 tablet (20 mg total) by mouth daily.    Dispense:  90 tablet    Refill:  3    Patient Instructions  Medication Instructions:  Your provider recommends that you  continue on your current medications as directed. Please refer to the Current Medication list given to you today.   If you need a refill on your cardiac medications before your next appointment, please call your pharmacy.    Follow-Up: At [facility], you and your health needs are our priority.  As part of our continuing mission to provide you with exceptional heart care, we have created designated Provider Care Teams.  These Care Teams include your primary Cardiologist (physician) and Advanced Practice Providers (APPs -  Physician Assistants and Nurse Practitioners) who all work together to provide you with the care you need, when you need it. You will need a follow up appointment in:  12 months.  Please call our office 2 months in advance to schedule this appointment.  You may see Sherren Mocha, MD or one of the following Advanced Practice Providers on your designated Care Team: Richardson Dopp, PA-C [place], [place] . Daune Perch, NP     Signed, Sherren Mocha, MD  06/11/2018 3:02 PM    [facility]

## 2018-06-11 NOTE — Patient Instructions (Signed)
Medication Instructions:  Your provider recommends that you continue on your current medications as directed. Please refer to the Current Medication list given to you today.   If you need a refill on your cardiac medications before your next appointment, please call your pharmacy.    Follow-Up: At [facility], you and your health needs are our priority.  As part of our continuing mission to provide you with exceptional heart care, we have created designated Provider Care Teams.  These Care Teams include your primary Cardiologist (physician) and Advanced Practice Providers (APPs -  Physician Assistants and Nurse Practitioners) who all work together to provide you with the care you need, when you need it. You will need a follow up appointment in:  12 months.  Please call our office 2 months in advance to schedule this appointment.  You may see Michael Cooper, MD or one of the following Advanced Practice Providers on your designated Care Team: Scott Weaver, PA-C Vin Bhagat, PA-C . Janine Hammond, NP      

## 2018-06-12 ENCOUNTER — Ambulatory Visit
Admission: RE | Admit: 2018-06-12 | Discharge: 2018-06-12 | Disposition: A | Discharge status: Home / Self Care | Payer: Medicare HMO | Source: Ambulatory Visit | Attending: Internal Medicine | Admitting: Internal Medicine

## 2018-06-12 DIAGNOSIS — E2839 Other primary ovarian failure: Secondary | ICD-10-CM

## 2018-06-12 DIAGNOSIS — Z780 Asymptomatic menopausal state: Secondary | ICD-10-CM | POA: Diagnosis not present

## 2018-06-12 DIAGNOSIS — M85832 Other specified disorders of bone density and structure, left forearm: Secondary | ICD-10-CM | POA: Diagnosis not present

## 2018-06-12 DIAGNOSIS — Z13820 Encounter for screening for osteoporosis: Secondary | ICD-10-CM

## 2018-06-14 ENCOUNTER — Encounter: Payer: Self-pay | Admitting: Internal Medicine

## 2018-06-14 DIAGNOSIS — M8580 Other specified disorders of bone density and structure, unspecified site: Secondary | ICD-10-CM | POA: Insufficient documentation

## 2018-07-04 ENCOUNTER — Other Ambulatory Visit: Payer: Self-pay | Admitting: Cardiovascular Disease

## 2018-07-04 DIAGNOSIS — E785 Hyperlipidemia, unspecified: Secondary | ICD-10-CM

## 2018-07-04 DIAGNOSIS — I10 Essential (primary) hypertension: Secondary | ICD-10-CM

## 2018-07-04 DIAGNOSIS — I6523 Occlusion and stenosis of bilateral carotid arteries: Secondary | ICD-10-CM

## 2018-07-23 ENCOUNTER — Encounter: Payer: Self-pay | Admitting: Internal Medicine

## 2018-07-23 ENCOUNTER — Other Ambulatory Visit: Payer: Self-pay

## 2018-07-23 ENCOUNTER — Ambulatory Visit (INDEPENDENT_AMBULATORY_CARE_PROVIDER_SITE_OTHER): Payer: Medicare HMO | Admitting: Internal Medicine

## 2018-07-23 VITALS — BP 168/88 | HR 71 | Temp 98.6°F | Resp 16 | Ht 64.0 in | Wt 195.8 lb

## 2018-07-23 DIAGNOSIS — I10 Essential (primary) hypertension: Secondary | ICD-10-CM | POA: Diagnosis not present

## 2018-07-23 DIAGNOSIS — K219 Gastro-esophageal reflux disease without esophagitis: Secondary | ICD-10-CM

## 2018-07-23 DIAGNOSIS — M89319 Hypertrophy of bone, unspecified shoulder: Secondary | ICD-10-CM | POA: Diagnosis not present

## 2018-07-23 NOTE — Assessment & Plan Note (Signed)
Right clavicle is more pronounced than the left She denies any pain She just noticed this this morning, but admits it may have been there before, but was concerned with everything going on right now and her history of cancer No concerning symptoms Likely musculoskeletal in nature-her right shoulder slightly more elevated than the left, which could be contributing Reassured her that this is nothing concerning we will just monitor No further evaluation needed

## 2018-07-23 NOTE — Progress Notes (Signed)
Subjective:    Patient ID: [name], female    DOB: 03-24-1946, 73 y.o.   MRN: [id number]  HPI The patient is here in person for an acute visit.  She is primarily here because of an abnormal clavicle.   This morning she was feeling her neck/upper chest and noticed that her right clavicle was larger than the left.  She denies any pain.  She was just concerned with her history of cancer and everything going on with the virus that it may be something serious.  She is unsure if this is new or she noticed it before and did not think much of it.  She denies any unusual lumps or bumps in her neck.  She denies neck, shoulder or back pain.  Hypertension: She typically takes her medication daily, but has not been taking labetalol because she ran out.  She just picked up the prescription this morning. She is compliant with a low sodium diet.  She denies chest pain, palpitations, edema, shortness of breath and regular headaches. She does not monitor her blood pressure at home.   GERD:  She is taking her medication daily as prescribed.  She denies any GERD symptoms and feels her GERD is well controlled.      Medications and allergies reviewed with patient and updated if appropriate.  Patient Active Problem List   Diagnosis Date Noted  . Osteopenia 06/14/2018  . Achilles tendinitis 01/29/2017  . Bilateral carotid artery occlusion 01/29/2017  . Neuropathy 01/28/2017  . Thyromegaly 08/13/2016  . Prediabetes 07/24/2015  . GERD (gastroesophageal reflux disease) 07/20/2015  . History of breast cancer 07/13/2015  . Vitiligo 07/07/2012  . PLANTAR FASCIITIS 06/16/2010  . Tobacco dependence 06/14/2009  . Hyperlipidemia 11/10/2008  . ANGIONEUROTIC EDEMA 01/09/2008  . CAROTID ARTERY STENOSIS, BILATERAL 06/17/2007  . Essential hypertension 06/12/2007  . Personal history of malignant neoplasm of breast 02/25/2007  . RHEUMATIC FEVER, HX OF 02/21/2007    Current Outpatient Medications on File Prior  to Visit  Medication Sig Dispense Refill  . amLODipine (NORVASC) 10 MG tablet Take 1 tablet (10 mg total) by mouth daily. 90 tablet 1  . aspirin EC 81 MG tablet Take 1 tablet (81 mg total) by mouth daily. 90 tablet 3  . atorvastatin (LIPITOR) 20 MG tablet Take 1 tablet (20 mg total) by mouth daily. 90 tablet 3  . labetalol (NORMODYNE) 300 MG tablet Take 1 tablet (300 mg total) by mouth 2 (two) times daily. 180 tablet 1  . omeprazole (PRILOSEC) 40 MG capsule TAKE 1 CAPSULE BY MOUTH EVERY DAY 90 capsule 3   No current facility-administered medications on file prior to visit.     Past Medical History:  Diagnosis Date  . Absence of menstruation    amenorrhea  . Angioneurotic edema not elsewhere classified    due to ACE-I  . Breast disorder    breast cancer 2007  . Cancer [facility]) 2007   Left Breast Cancer  . Carotid stenosis    Carotid US (8/15):  RICA 63-78%; LICA 58-85% >>> FU 1 year  . H/O: rheumatic fever   . History of breast cancer 07/13/2015  . HLD (hyperlipidemia)   . Occlusion and stenosis of carotid artery without mention of cerebral infarction   . Other abnormal glucose    fasting hyperglycemia  . Personal history of malignant neoplasm of breast   . Personal history of radiation therapy 2007  . Unspecified essential hypertension     Past Surgical History:  Procedure Laterality Date  . APPENDECTOMY    . BREAST BIOPSY Left 12/06/2005   malignant  . BREAST LUMPECTOMY Left 01/08/2006  . COLONOSCOPY  2005   negative; Dr Collene Mares  . COLONOSCOPY N/A 09/02/2015   Procedure: COLONOSCOPY;  Surgeon: Danie Binder, MD;  [facility];  Service: Endoscopy;  Laterality: N/A;  1215-moved to 1130 Ginger to notify pt  . G0 P0     Dr. Maudry Diego    Social History   Socioeconomic History  . Marital status: Single    Spouse name: Not on file  . Number of children: Not on file  . Years of education: Not on file  . Highest education level: Not on file  Occupational History  .  Not on file  Social Needs  . Financial resource strain: Not hard at all  . Food insecurity:    Worry: Never true    Inability: Never true  . Transportation needs:    Medical: No    Non-medical: No  Tobacco Use  . Smoking status: Current Some Day Smoker    Packs/day: 0.50    Years: 48.00    Pack years: 24.00    Types: Cigarettes  . Smokeless tobacco: Never Used  Substance and Sexual Activity  . Alcohol use: Yes    Comment: occ  . Drug use: No  . Sexual activity: Yes    Birth control/protection: Post-menopausal  Lifestyle  . Physical activity:    Days per week: 0 days    Minutes per session: 0 min  . Stress: Not at all  Relationships  . Social connections:    Talks on phone: More than three times a week    Gets together: More than three times a week    Attends religious service: More than 4 times per year    Active member of club or organization: Yes    Attends meetings of clubs or organizations: More than 4 times per year    Relationship status: Not on file  Other Topics Concern  . Not on file  Social History Narrative   Single, no children.   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     Family History  Problem Relation Age of Onset  . Breast cancer Sister   . Hypertension Other        some of Pts brothers and sisters  . Heart attack Neg Hx   . Stroke Neg Hx   . Diabetes Neg Hx   . Heart disease Neg Hx     Review of Systems  Constitutional: Negative for chills, diaphoresis, fever and unexpected weight change.  HENT: Negative for congestion, sinus pain and sore throat.   Respiratory: Negative for cough, shortness of breath and wheezing.   Cardiovascular: Negative for chest pain, palpitations and leg swelling.  Neurological: Negative for light-headedness and headaches.       Objective:   Vitals:   07/23/18 1255  BP: (!) 168/88  Pulse: 71  Resp: 16  Temp: 98.6 F (37 C)  SpO2: 97%   BP Readings from Last 3 Encounters:  07/23/18 (!) 168/88   06/11/18 140/82  05/21/18 (!) 164/68   Wt Readings from Last 3 Encounters:  07/23/18 195 lb 12.8 oz (88.8 kg)  06/11/18 198 lb 6.4 oz (90 kg)  05/21/18 195 lb (88.5 kg)   Body mass index is 33.61 kg/m.   Physical Exam    Constitutional: Appears well-developed and well-nourished. No distress.  HENT:  Head: Normocephalic  and atraumatic.  Neck: Neck supple. No tracheal deviation present. No thyromegaly present.  No cervical, occipital or mandibular lymphadenopathy Cardiovascular: Normal rate, regular rhythm and normal heart sounds. No murmur heard.  No edema Pulmonary/Chest: Effort normal and breath sounds normal. No respiratory distress. No has no wheezes. No rales.  Musculoskeletal: Right clavicle is more pronounced than the left.  There is no tenderness with palpation throughout either clavicle.  Right shoulder does seem to be slightly elevated compared to left.  No neck pain or upper back pain with palpation. Skin: Skin is warm and dry. Not diaphoretic.  Psychiatric: Normal mood and affect. Behavior is normal.       Assessment & Plan:    See Problem List for Assessment and Plan of chronic medical problems.

## 2018-07-23 NOTE — Assessment & Plan Note (Signed)
GERD controlled Continue daily medication  

## 2018-07-23 NOTE — Patient Instructions (Addendum)
Start monitoring your BP at home.  It should be ideally less than 140/90.   Your uneven clavicles are not concerning.  We will just monitor this.     Stay safe and please let us know if you need anything.

## 2018-07-23 NOTE — Assessment & Plan Note (Signed)
Blood pressure elevated here today and has been elevated in the past She is run out of her labetalol, but just picked up the prescription today and will restart She does not monitor her blood pressure at home, but states she can start monitoring it-stressed the importance of monitoring it and keeping her blood pressure ideally less than 140/90 If it is not well controlled she should let us know No changes in medication today

## 2018-08-11 ENCOUNTER — Ambulatory Visit: Payer: Medicare HMO | Admitting: Cardiovascular Disease

## 2018-09-07 ENCOUNTER — Other Ambulatory Visit: Payer: Self-pay | Admitting: Internal Medicine

## 2018-09-29 ENCOUNTER — Telehealth: Payer: Self-pay | Admitting: Cardiovascular Disease

## 2018-09-29 NOTE — Progress Notes (Deleted)
Subjective:    Patient ID: [name], female    DOB: [birth year], 73 y.o.   MRN: [id number]  HPI The patient is here for an acute visit.   Right sided pain:  Medications and allergies reviewed with patient and updated if appropriate.  Patient Active Problem List   Diagnosis Date Noted  . Clavicle enlargement 07/23/2018  . Osteopenia 06/14/2018  . Achilles tendinitis 01/29/2017  . Bilateral carotid artery occlusion 01/29/2017  . Neuropathy 01/28/2017  . Prediabetes 07/24/2015  . GERD (gastroesophageal reflux disease) 07/20/2015  . History of breast cancer 07/13/2015  . Vitiligo 07/07/2012  . PLANTAR FASCIITIS 06/16/2010  . Tobacco dependence 06/14/2009  . Hyperlipidemia 11/10/2008  . ANGIONEUROTIC EDEMA 01/09/2008  . CAROTID ARTERY STENOSIS, BILATERAL 06/17/2007  . Essential hypertension 06/12/2007  . Personal history of malignant neoplasm of breast 02/25/2007  . RHEUMATIC FEVER, HX OF 02/21/2007    Current Outpatient Medications on File Prior to Visit  Medication Sig Dispense Refill  . amLODipine (NORVASC) 10 MG tablet Take 1 tablet (10 mg total) by mouth daily. 90 tablet 1  . aspirin EC 81 MG tablet Take 1 tablet (81 mg total) by mouth daily. 90 tablet 3  . atorvastatin (LIPITOR) 20 MG tablet Take 1 tablet (20 mg total) by mouth daily. 90 tablet 3  . labetalol (NORMODYNE) 300 MG tablet Take 1 tablet (300 mg total) by mouth 2 (two) times daily. 180 tablet 1  . omeprazole (PRILOSEC) 40 MG capsule TAKE 1 CAPSULE BY MOUTH EVERY DAY 90 capsule 0   No current facility-administered medications on file prior to visit.     Past Medical History:  Diagnosis Date  . Absence of menstruation    amenorrhea  . Angioneurotic edema not elsewhere classified    due to ACE-I  . Breast disorder    breast cancer 2007  . Cancer [facility]) 2007   Left Breast Cancer  . Carotid stenosis    Carotid US (8/15):  RICA 15-17%; LICA 61-60% >>> FU 1 year  . H/O: rheumatic fever   . History  of breast cancer 07/13/2015  . HLD (hyperlipidemia)   . Occlusion and stenosis of carotid artery without mention of cerebral infarction   . Other abnormal glucose    fasting hyperglycemia  . Personal history of malignant neoplasm of breast   . Personal history of radiation therapy 2007  . Unspecified essential hypertension     Past Surgical History:  Procedure Laterality Date  . APPENDECTOMY    . BREAST BIOPSY Left 12/06/2005   malignant  . BREAST LUMPECTOMY Left 01/08/2006  . COLONOSCOPY  2005   negative; Dr Collene Mares  . COLONOSCOPY N/A 09/02/2015   Procedure: COLONOSCOPY;  Surgeon: Danie Binder, MD;  [facility];  Service: Endoscopy;  Laterality: N/A;  1215-moved to 1130 Ginger to notify pt  . G0 P0     Dr. Maudry Diego    Social History   Socioeconomic History  . Marital status: Single    Spouse name: Not on file  . Number of children: Not on file  . Years of education: Not on file  . Highest education level: Not on file  Occupational History  . Not on file  Social Needs  . Financial resource strain: Not hard at all  . Food insecurity    Worry: Never true    Inability: Never true  . Transportation needs    Medical: No    Non-medical: No  Tobacco Use  . Smoking  status: Current Some Day Smoker    Packs/day: 0.50    Years: 48.00    Pack years: 24.00    Types: Cigarettes  . Smokeless tobacco: Never Used  Substance and Sexual Activity  . Alcohol use: Yes    Comment: occ  . Drug use: No  . Sexual activity: Yes    Birth control/protection: Post-menopausal  Lifestyle  . Physical activity    Days per week: 0 days    Minutes per session: 0 min  . Stress: Not at all  Relationships  . Social connections    Talks on phone: More than three times a week    Gets together: More than three times a week    Attends religious service: More than 4 times per year    Active member of club or organization: Yes    Attends meetings of clubs or organizations: More than 4  times per year    Relationship status: Not on file  Other Topics Concern  . Not on file  Social History Narrative   Single, no children.   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     Family History  Problem Relation Age of Onset  . Breast cancer Sister   . Hypertension Other        some of Pts brothers and sisters  . Heart attack Neg Hx   . Stroke Neg Hx   . Diabetes Neg Hx   . Heart disease Neg Hx     Review of Systems     Objective:  There were no vitals filed for this visit. BP Readings from Last 3 Encounters:  07/23/18 (!) 168/88  06/11/18 140/82  05/21/18 (!) 164/68   Wt Readings from Last 3 Encounters:  07/23/18 195 lb 12.8 oz (88.8 kg)  06/11/18 198 lb 6.4 oz (90 kg)  05/21/18 195 lb (88.5 kg)   There is no height or weight on file to calculate BMI.   Physical Exam         Assessment & Plan:    See Problem List for Assessment and Plan of chronic medical problems.

## 2018-09-29 NOTE — Telephone Encounter (Signed)
Spoke with pt and has noted Right sided pain since Wednesday starting in  right breast and radiating to shoulder now mainly to breast area  Pt can touch area and note pain .Pt has taken Aleve with some relief Encouraged pt to see PMD per pt as appt tom at 2:15 pm with Dr Quay Burow for eval Will forward to Dr Burt Knack for review and recommendations./cy

## 2018-09-29 NOTE — Telephone Encounter (Signed)
Patient called complaining of pain in her right shoulder/chest/arm. She states that the pain started on Wednesday, and she has pain in her arm when moving

## 2018-09-30 ENCOUNTER — Other Ambulatory Visit: Payer: Self-pay

## 2018-09-30 ENCOUNTER — Ambulatory Visit: Payer: Medicare HMO | Admitting: Internal Medicine

## 2018-09-30 NOTE — Telephone Encounter (Signed)
No other recommendations. Doesn't sound cardiac-related.

## 2018-09-30 NOTE — Telephone Encounter (Signed)
Pt aware of Dr Antionette Char response Per pt cancelled appt with Dr Quay Burow today not sure why Pt will continue to monitor and in no improvement or worsens will call back ./cy

## 2018-11-04 ENCOUNTER — Other Ambulatory Visit: Payer: Self-pay | Admitting: Internal Medicine

## 2018-11-04 DIAGNOSIS — Z1231 Encounter for screening mammogram for malignant neoplasm of breast: Secondary | ICD-10-CM

## 2018-11-18 NOTE — Progress Notes (Signed)
Subjective:    Patient ID: [name], female    DOB: Mar 11, 1946, 73 y.o.   MRN: [id number]  HPI The patient is here for follow up.  She is not exercising regularly.     Hypertension: She does take her medication daily, but did not take it today - she forgot. She has had a headache today, which is unusual for her and she knew something was wrong.  She is compliant with a low sodium diet.  She denies chest pain, palpitations, edema, shortness of breath and regular headaches. She does not monitor her blood pressure at home-she can have someone check it for her.    Hyperlipidemia: She is taking her medication daily. She is compliant with a low fat/cholesterol diet. She denies myalgias.   Prediabetes:  She is compliant with a low sugar/carbohydrate diet.  She is not exercising regularly.  GERD:  She is taking her medication daily as prescribed.  She denies any GERD symptoms and feels her GERD is well controlled.  If she does not take her medication she does have gerd.    Medications and allergies reviewed with patient and updated if appropriate.  Patient Active Problem List   Diagnosis Date Noted  . Clavicle enlargement 07/23/2018  . Osteopenia 06/14/2018  . Achilles tendinitis 01/29/2017  . Bilateral carotid artery occlusion 01/29/2017  . Neuropathy 01/28/2017  . Prediabetes 07/24/2015  . GERD (gastroesophageal reflux disease) 07/20/2015  . History of breast cancer 07/13/2015  . Vitiligo 07/07/2012  . PLANTAR FASCIITIS 06/16/2010  . Tobacco dependence 06/14/2009  . Hyperlipidemia 11/10/2008  . ANGIONEUROTIC EDEMA 01/09/2008  . CAROTID ARTERY STENOSIS, BILATERAL 06/17/2007  . Essential hypertension 06/12/2007  . Personal history of malignant neoplasm of breast 02/25/2007  . RHEUMATIC FEVER, HX OF 02/21/2007    Current Outpatient Medications on File Prior to Visit  Medication Sig Dispense Refill  . amLODipine (NORVASC) 10 MG tablet Take 1 tablet (10 mg total) by mouth  daily. 90 tablet 1  . aspirin EC 81 MG tablet Take 1 tablet (81 mg total) by mouth daily. 90 tablet 3  . atorvastatin (LIPITOR) 20 MG tablet Take 1 tablet (20 mg total) by mouth daily. 90 tablet 3  . labetalol (NORMODYNE) 300 MG tablet Take 1 tablet (300 mg total) by mouth 2 (two) times daily. 180 tablet 1  . omeprazole (PRILOSEC) 40 MG capsule TAKE 1 CAPSULE BY MOUTH EVERY DAY 90 capsule 0   No current facility-administered medications on file prior to visit.     Past Medical History:  Diagnosis Date  . Absence of menstruation    amenorrhea  . Angioneurotic edema not elsewhere classified    due to ACE-I  . Breast disorder    breast cancer 2007  . Cancer [facility]) 2007   Left Breast Cancer  . Carotid stenosis    Carotid US (8/15):  RICA 78-67%; LICA 67-20% >>> FU 1 year  . H/O: rheumatic fever   . History of breast cancer 07/13/2015  . HLD (hyperlipidemia)   . Occlusion and stenosis of carotid artery without mention of cerebral infarction   . Other abnormal glucose    fasting hyperglycemia  . Personal history of malignant neoplasm of breast   . Personal history of radiation therapy 2007  . Unspecified essential hypertension     Past Surgical History:  Procedure Laterality Date  . APPENDECTOMY    . BREAST BIOPSY Left 12/06/2005   malignant  . BREAST LUMPECTOMY Left 01/08/2006  . COLONOSCOPY  2005   negative; Dr Collene Mares  . COLONOSCOPY N/A 09/02/2015   Procedure: COLONOSCOPY;  Surgeon: Danie Binder, MD;  Location[facility];  Service: Endoscopy;  Laterality: N/A;  1215-moved to 1130 Ginger to notify pt  . G0 P0     Dr. Maudry Diego    Social History   Socioeconomic History  . Marital status: Single    Spouse name: Not on file  . Number of children: Not on file  . Years of education: Not on file  . Highest education level: Not on file  Occupational History  . Not on file  Social Needs  . Financial resource strain: Not hard at all  . Food insecurity    Worry: Never true     Inability: Never true  . Transportation needs    Medical: No    Non-medical: No  Tobacco Use  . Smoking status: Current Some Day Smoker    Packs/day: 0.50    Years: 48.00    Pack years: 24.00    Types: Cigarettes  . Smokeless tobacco: Never Used  Substance and Sexual Activity  . Alcohol use: Yes    Comment: occ  . Drug use: No  . Sexual activity: Yes    Birth control/protection: Post-menopausal  Lifestyle  . Physical activity    Days per week: 0 days    Minutes per session: 0 min  . Stress: Not at all  Relationships  . Social connections    Talks on phone: More than three times a week    Gets together: More than three times a week    Attends religious service: More than 4 times per year    Active member of club or organization: Yes    Attends meetings of clubs or organizations: More than 4 times per year    Relationship status: Not on file  Other Topics Concern  . Not on file  Social History Narrative   Single, no children.   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     Family History  Problem Relation Age of Onset  . Breast cancer Sister   . Hypertension Other        some of Pts brothers and sisters  . Heart attack Neg Hx   . Stroke Neg Hx   . Diabetes Neg Hx   . Heart disease Neg Hx     Review of Systems  Constitutional: Negative for chills and fever.  Respiratory: Positive for cough (smoking). Negative for shortness of breath and wheezing.   Cardiovascular: Negative for chest pain, palpitations and leg swelling.  Neurological: Positive for headaches (today, not regular headaches). Negative for dizziness and light-headedness.       Objective:   Vitals:   11/19/18 1358  BP: (!) 226/92  Pulse: 73  Resp: 18  Temp: 98.3 F (36.8 C)  SpO2: 97%   BP Readings from Last 3 Encounters:  11/19/18 (!) 226/92  07/23/18 (!) 168/88  06/11/18 140/82   Wt Readings from Last 3 Encounters:  11/19/18 194 lb (88 kg)  07/23/18 195 lb 12.8 oz (88.8 kg)   06/11/18 198 lb 6.4 oz (90 kg)   Body mass index is 33.3 kg/m.   Physical Exam    Constitutional: Appears well-developed and well-nourished. No distress.  HENT:  Head: Normocephalic and atraumatic.  Neck: Neck supple. No tracheal deviation present. No thyromegaly present.  No cervical lymphadenopathy Cardiovascular: Normal rate, regular rhythm and normal heart sounds.   2/6 systolic murmur heard.  No carotid bruit .  No edema Pulmonary/Chest: Effort normal and breath sounds normal. No respiratory distress. No has no wheezes. No rales.  Skin: Skin is warm and dry. Not diaphoretic.  Psychiatric: Normal mood and affect. Behavior is normal.      Assessment & Plan:    See Problem List for Assessment and Plan of chronic medical problems.

## 2018-11-18 NOTE — Patient Instructions (Addendum)
Your BP needs to be less than 140/90.   Tests ordered today. Your results will be released to [place] (or called to you) after review.  If any changes need to be made, you will be notified at that same time.   Medications reviewed and updated.  Changes include :   none  Your prescription(s) have been submitted to your pharmacy. Please take as directed and contact our office if you believe you are having problem(s) with the medication(s).   Please followup in 3 months

## 2018-11-19 ENCOUNTER — Other Ambulatory Visit (INDEPENDENT_AMBULATORY_CARE_PROVIDER_SITE_OTHER): Payer: Medicare HMO

## 2018-11-19 ENCOUNTER — Other Ambulatory Visit: Payer: Self-pay

## 2018-11-19 ENCOUNTER — Ambulatory Visit (INDEPENDENT_AMBULATORY_CARE_PROVIDER_SITE_OTHER): Payer: Medicare HMO | Admitting: Internal Medicine

## 2018-11-19 ENCOUNTER — Encounter: Payer: Self-pay | Admitting: Internal Medicine

## 2018-11-19 VITALS — BP 226/92 | HR 73 | Temp 98.3°F | Resp 18 | Ht 64.0 in | Wt 194.0 lb

## 2018-11-19 DIAGNOSIS — K219 Gastro-esophageal reflux disease without esophagitis: Secondary | ICD-10-CM | POA: Diagnosis not present

## 2018-11-19 DIAGNOSIS — R7303 Prediabetes: Secondary | ICD-10-CM

## 2018-11-19 DIAGNOSIS — I10 Essential (primary) hypertension: Secondary | ICD-10-CM

## 2018-11-19 DIAGNOSIS — E785 Hyperlipidemia, unspecified: Secondary | ICD-10-CM

## 2018-11-19 LAB — LIPID PANEL
Cholesterol: 164 mg/dL (ref 0–200)
HDL: 50.4 mg/dL (ref >39.00)
LDL Cholesterol: 85 mg/dL (ref 0–99)
NonHDL: 113.63
Total CHOL/HDL Ratio: 3
Triglycerides: 143 mg/dL (ref 0.0–149.0)
VLDL: 28.6 mg/dL (ref 0.0–40.0)

## 2018-11-19 LAB — COMPREHENSIVE METABOLIC PANEL
ALT: 7 U/L (ref 0–35)
AST: 9 U/L (ref 0–37)
Albumin: 4.4 g/dL (ref 3.5–5.2)
Alkaline Phosphatase: 86 U/L (ref 39–117)
BUN: 19 mg/dL (ref 6–23)
CO2: 28 mEq/L (ref 19–32)
Calcium: 9.7 mg/dL (ref 8.4–10.5)
Chloride: 102 mEq/L (ref 96–112)
Creatinine, Ser: 0.97 mg/dL (ref 0.40–1.20)
GFR: 68.41 mL/min (ref >60.00)
Glucose, Bld: 91 mg/dL (ref 70–99)
Potassium: 4 mEq/L (ref 3.5–5.1)
Sodium: 139 mEq/L (ref 135–145)
Total Bilirubin: 0.6 mg/dL (ref 0.2–1.2)
Total Protein: 8.1 g/dL (ref 6.0–8.3)

## 2018-11-19 LAB — HEMOGLOBIN A1C: Hgb A1c MFr Bld: 6 % (ref 4.6–6.5)

## 2018-11-19 NOTE — Assessment & Plan Note (Signed)
Blood pressure extremely high here today secondary to forgetting to take her medications today She states she does take them on a regular basis, but this time she just forgot Stressed the importance of her taking it on a regular basis and the consequences of this high of a blood pressure She will take her blood pressure medications as soon as she gets home and since she is not having any concerning symptoms beyond a headache she does not need to be seen in the emergency room or given medication at this time Will have her blood pressure checked by her family member-stressed goal of less than 140/90 consistently Follow-up in 3 months, sooner if blood pressure is elevated CMP

## 2018-11-19 NOTE — Assessment & Plan Note (Signed)
Continue statin Check lipid panel, CMP Stressed the importance of regular exercise and healthy diet

## 2018-11-19 NOTE — Assessment & Plan Note (Signed)
Check a1c Low sugar / carb diet Stressed regular exercise   

## 2018-11-19 NOTE — Assessment & Plan Note (Signed)
GERD controlled Continue daily medication  

## 2018-11-20 ENCOUNTER — Encounter: Payer: Self-pay | Admitting: Internal Medicine

## 2018-12-09 ENCOUNTER — Other Ambulatory Visit: Payer: Self-pay | Admitting: Internal Medicine

## 2018-12-23 ENCOUNTER — Ambulatory Visit
Admission: RE | Admit: 2018-12-23 | Discharge: 2018-12-23 | Disposition: A | Discharge status: Home / Self Care | Payer: Medicare HMO | Source: Ambulatory Visit | Attending: Internal Medicine | Admitting: Internal Medicine

## 2018-12-23 ENCOUNTER — Other Ambulatory Visit: Payer: Self-pay

## 2018-12-23 DIAGNOSIS — Z1231 Encounter for screening mammogram for malignant neoplasm of breast: Secondary | ICD-10-CM

## 2019-02-22 NOTE — Patient Instructions (Addendum)
  Tests ordered today. Your results will be released to [place] (or called to you) after review.  If any changes need to be made, you will be notified at that same time.   Medications reviewed and updated.  Changes include :   Start hctz 25 mg for your BP  Your prescription(s) have been submitted to your pharmacy. Please take as directed and contact our office if you believe you are having problem(s) with the medication(s).   Please followup in 3 months   Monitor your BP at home - it should be 140/90 or less

## 2019-02-22 NOTE — Progress Notes (Signed)
Subjective:    Patient ID: [name], female    DOB: [birth year], 73 y.o.   MRN: [id number]  HPI The patient is here for follow up.  She is not exercising regularly.    Prediabetes:  She is compliant with a low sugar/carbohydrate diet.  She is not exercising regularly.  Hypertension: She is taking her medication daily. She is compliant with a low sodium diet.  She denies chest pain, palpitations, edema, shortness of breath and regular headaches. She does not monitor her blood pressure at home.    Hyperlipidemia: She is taking her medication daily. She is compliant with a low fat/cholesterol diet. She denies myalgias.   GERD:  She is taking her medication daily as prescribed.  She denies any GERD symptoms and feels her GERD is well controlled.   Tobacco abuse:  She is still smoking  She wants to quit.  She can not afford chantix.  She is trying to decrease how much she smokes.     Medications and allergies reviewed with patient and updated if appropriate.  Patient Active Problem List   Diagnosis Date Noted  . Clavicle enlargement 07/23/2018  . Osteopenia 06/14/2018  . Achilles tendinitis 01/29/2017  . Bilateral carotid artery occlusion 01/29/2017  . Neuropathy 01/28/2017  . Prediabetes 07/24/2015  . GERD (gastroesophageal reflux disease) 07/20/2015  . History of breast cancer 07/13/2015  . Vitiligo 07/07/2012  . PLANTAR FASCIITIS 06/16/2010  . Tobacco dependence 06/14/2009  . Hyperlipidemia 11/10/2008  . ANGIONEUROTIC EDEMA 01/09/2008  . Essential hypertension 06/12/2007  . Personal history of malignant neoplasm of breast 02/25/2007  . RHEUMATIC FEVER, HX OF 02/21/2007    Current Outpatient Medications on File Prior to Visit  Medication Sig Dispense Refill  . amLODipine (NORVASC) 10 MG tablet Take 1 tablet (10 mg total) by mouth daily. 90 tablet 1  . aspirin EC 81 MG tablet Take 1 tablet (81 mg total) by mouth daily. 90 tablet 3  . atorvastatin (LIPITOR) 20 MG  tablet Take 1 tablet (20 mg total) by mouth daily. 90 tablet 3  . labetalol (NORMODYNE) 300 MG tablet Take 1 tablet (300 mg total) by mouth 2 (two) times daily. 180 tablet 1  . omeprazole (PRILOSEC) 40 MG capsule TAKE 1 CAPSULE BY MOUTH EVERY DAY 90 capsule 1   No current facility-administered medications on file prior to visit.     Past Medical History:  Diagnosis Date  . Absence of menstruation    amenorrhea  . Angioneurotic edema not elsewhere classified    due to ACE-I  . Breast disorder    breast cancer 2007  . Cancer [facility]) 2007   Left Breast Cancer  . Carotid stenosis    Carotid US (8/15):  RICA 123456; LICA 123456 >>> FU 1 year  . H/O: rheumatic fever   . History of breast cancer 07/13/2015  . HLD (hyperlipidemia)   . Occlusion and stenosis of carotid artery without mention of cerebral infarction   . Other abnormal glucose    fasting hyperglycemia  . Personal history of malignant neoplasm of breast   . Personal history of radiation therapy 2007  . Unspecified essential hypertension     Past Surgical History:  Procedure Laterality Date  . APPENDECTOMY    . BREAST BIOPSY Left 12/06/2005   malignant  . BREAST LUMPECTOMY Left 01/08/2006  . BREAST LUMPECTOMY Left 01/08/2006  . COLONOSCOPY  2005   negative; Dr Collene Mares  . COLONOSCOPY N/A 09/02/2015   Procedure: COLONOSCOPY;  Surgeon:  Danie Binder, MD;  Location: [facility];  Service: Endoscopy;  Laterality: N/A;  1215-moved to 1130 Ginger to notify pt  . G0 P0     Dr. Maudry Diego    Social History   Socioeconomic History  . Marital status: Single    Spouse name: Not on file  . Number of children: Not on file  . Years of education: Not on file  . Highest education level: Not on file  Occupational History  . Not on file  Social Needs  . Financial resource strain: Not hard at all  . Food insecurity    Worry: Never true    Inability: Never true  . Transportation needs    Medical: No    Non-medical: No  Tobacco Use   . Smoking status: Current Some Day Smoker    Packs/day: 0.50    Years: 48.00    Pack years: 24.00    Types: Cigarettes  . Smokeless tobacco: Never Used  Substance and Sexual Activity  . Alcohol use: Yes    Comment: occ  . Drug use: No  . Sexual activity: Yes    Birth control/protection: Post-menopausal  Lifestyle  . Physical activity    Days per week: 0 days    Minutes per session: 0 min  . Stress: Not at all  Relationships  . Social connections    Talks on phone: More than three times a week    Gets together: More than three times a week    Attends religious service: More than 4 times per year    Active member of club or organization: Yes    Attends meetings of clubs or organizations: More than 4 times per year    Relationship status: Not on file  Other Topics Concern  . Not on file  Social History Narrative   Single, no children.   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     Family History  Problem Relation Age of Onset  . Breast cancer Sister   . Hypertension Other        some of Pts brothers and sisters  . Heart attack Neg Hx   . Stroke Neg Hx   . Diabetes Neg Hx   . Heart disease Neg Hx     Review of Systems  Constitutional: Negative for chills and fever.  Respiratory: Positive for wheezing (occ). Negative for cough and shortness of breath.   Cardiovascular: Negative for chest pain, palpitations and leg swelling.  Neurological: Negative for light-headedness and headaches.       Objective:   Vitals:   02/23/19 1421  BP: (!) 164/82  Pulse: (!) 54  Resp: 16  Temp: 98.2 F (36.8 C)  SpO2: 98%   BP Readings from Last 3 Encounters:  02/23/19 (!) 164/82  11/19/18 (!) 226/92  07/23/18 (!) 168/88   Wt Readings from Last 3 Encounters:  02/23/19 195 lb 6.4 oz (88.6 kg)  11/19/18 194 lb (88 kg)  07/23/18 195 lb 12.8 oz (88.8 kg)   Body mass index is 33.54 kg/m.   Physical Exam    Constitutional: Appears well-developed and well-nourished. No  distress.  HENT:  Head: Normocephalic and atraumatic.  Neck: Neck supple. No tracheal deviation present. No thyromegaly present.  No cervical lymphadenopathy Cardiovascular: Normal rate, regular rhythm and normal heart sounds.   No murmur heard. No carotid bruit .  No edema Pulmonary/Chest: Effort normal and breath sounds normal. No respiratory distress. No has no wheezes. No rales.  Skin: Skin is warm and dry. Not diaphoretic.  Psychiatric: Normal mood and affect. Behavior is normal.      Assessment & Plan:    See Problem List for Assessment and Plan of chronic medical problems.

## 2019-02-23 ENCOUNTER — Ambulatory Visit (INDEPENDENT_AMBULATORY_CARE_PROVIDER_SITE_OTHER): Payer: Medicare HMO | Admitting: Internal Medicine

## 2019-02-23 ENCOUNTER — Other Ambulatory Visit (INDEPENDENT_AMBULATORY_CARE_PROVIDER_SITE_OTHER): Payer: Medicare HMO

## 2019-02-23 ENCOUNTER — Other Ambulatory Visit: Payer: Self-pay

## 2019-02-23 ENCOUNTER — Encounter: Payer: Self-pay | Admitting: Internal Medicine

## 2019-02-23 VITALS — BP 164/82 | HR 54 | Temp 98.2°F | Resp 16 | Ht 64.0 in | Wt 195.4 lb

## 2019-02-23 DIAGNOSIS — R7303 Prediabetes: Secondary | ICD-10-CM | POA: Diagnosis not present

## 2019-02-23 DIAGNOSIS — K219 Gastro-esophageal reflux disease without esophagitis: Secondary | ICD-10-CM

## 2019-02-23 DIAGNOSIS — I10 Essential (primary) hypertension: Secondary | ICD-10-CM

## 2019-02-23 DIAGNOSIS — E785 Hyperlipidemia, unspecified: Secondary | ICD-10-CM | POA: Diagnosis not present

## 2019-02-23 DIAGNOSIS — F17200 Nicotine dependence, unspecified, uncomplicated: Secondary | ICD-10-CM

## 2019-02-23 DIAGNOSIS — R69 Illness, unspecified: Secondary | ICD-10-CM | POA: Diagnosis not present

## 2019-02-23 LAB — CBC WITH DIFFERENTIAL/PLATELET
Basophils Absolute: 0.1 10*3/uL (ref 0.0–0.1)
Basophils Relative: 1.3 % (ref 0.0–3.0)
Eosinophils Absolute: 0.1 10*3/uL (ref 0.0–0.7)
Eosinophils Relative: 1 % (ref 0.0–5.0)
HCT: 41.3 % (ref 36.0–46.0)
Hemoglobin: 13.5 g/dL (ref 12.0–15.0)
Lymphocytes Relative: 46.4 % — ABNORMAL HIGH (ref 12.0–46.0)
Lymphs Abs: 4.3 10*3/uL — ABNORMAL HIGH (ref 0.7–4.0)
MCHC: 32.6 g/dL (ref 30.0–36.0)
MCV: 85.8 fl (ref 78.0–100.0)
Monocytes Absolute: 0.5 10*3/uL (ref 0.1–1.0)
Monocytes Relative: 5 % (ref 3.0–12.0)
Neutro Abs: 4.3 10*3/uL (ref 1.4–7.7)
Neutrophils Relative %: 46.3 % (ref 43.0–77.0)
Platelets: 214 10*3/uL (ref 150.0–400.0)
RBC: 4.82 Mil/uL (ref 3.87–5.11)
RDW: 14.4 % (ref 11.5–15.5)
WBC: 9.4 10*3/uL (ref 4.0–10.5)

## 2019-02-23 LAB — LIPID PANEL
Cholesterol: 171 mg/dL (ref 0–200)
HDL: 54.3 mg/dL (ref >39.00)
LDL Cholesterol: 78 mg/dL (ref 0–99)
NonHDL: 116.31
Total CHOL/HDL Ratio: 3
Triglycerides: 192 mg/dL — ABNORMAL HIGH (ref 0.0–149.0)
VLDL: 38.4 mg/dL (ref 0.0–40.0)

## 2019-02-23 LAB — COMPREHENSIVE METABOLIC PANEL
ALT: 7 U/L (ref 0–35)
AST: 11 U/L (ref 0–37)
Albumin: 4.2 g/dL (ref 3.5–5.2)
Alkaline Phosphatase: 77 U/L (ref 39–117)
BUN: 18 mg/dL (ref 6–23)
CO2: 24 mEq/L (ref 19–32)
Calcium: 9.3 mg/dL (ref 8.4–10.5)
Chloride: 103 mEq/L (ref 96–112)
Creatinine, Ser: 1.01 mg/dL (ref 0.40–1.20)
GFR: 65.24 mL/min (ref >60.00)
Glucose, Bld: 100 mg/dL — ABNORMAL HIGH (ref 70–99)
Potassium: 3.7 mEq/L (ref 3.5–5.1)
Sodium: 137 mEq/L (ref 135–145)
Total Bilirubin: 0.6 mg/dL (ref 0.2–1.2)
Total Protein: 7.7 g/dL (ref 6.0–8.3)

## 2019-02-23 LAB — TSH: TSH: 1.64 u[IU]/mL (ref 0.35–4.50)

## 2019-02-23 LAB — HEMOGLOBIN A1C: Hgb A1c MFr Bld: 5.8 % (ref 4.6–6.5)

## 2019-02-23 MED ORDER — HYDROCHLOROTHIAZIDE 25 MG PO TABS: 25.0000 mg | ORAL_TABLET | Freq: Every day | ORAL | 1 refills | Status: DC

## 2019-02-23 MED ORDER — LABETALOL HCL 300 MG PO TABS: 300.0000 mg | ORAL_TABLET | Freq: Two times a day (BID) | ORAL | 1 refills | Status: DC

## 2019-02-23 MED ORDER — AMLODIPINE BESYLATE 10 MG PO TABS: 10.0000 mg | ORAL_TABLET | Freq: Every day | ORAL | 1 refills | Status: DC

## 2019-02-23 MED ORDER — OMEPRAZOLE 40 MG PO CPDR: 40.0000 mg | DELAYED_RELEASE_CAPSULE | Freq: Every day | ORAL | 1 refills | Status: DC

## 2019-02-23 NOTE — Assessment & Plan Note (Signed)
BP high here She will have her sister check her BP Continue labetalol and amlodipine Add hctz 25 mg daily - she was on this in the past and did ok with it cmp F/u in 3 months, sooner if BP high at home

## 2019-02-23 NOTE — Assessment & Plan Note (Signed)
GERD controlled Continue daily medication  

## 2019-02-23 NOTE — Assessment & Plan Note (Signed)
Check a1c Low sugar / carb diet Stressed regular exercise   

## 2019-02-23 NOTE — Assessment & Plan Note (Signed)
Stressed smoking cessation - continue to decrease the amount she smokes

## 2019-02-23 NOTE — Assessment & Plan Note (Signed)
Check lipid panel,cmp ,tsh Continue daily statin Regular exercise and healthy diet encouraged  

## 2019-02-25 ENCOUNTER — Encounter: Payer: Self-pay | Admitting: Internal Medicine

## 2019-04-30 ENCOUNTER — Encounter: Payer: Self-pay | Admitting: Internal Medicine

## 2019-04-30 MED ORDER — GABAPENTIN 300 MG PO CAPS: ORAL_CAPSULE | ORAL | 1 refills | Status: DC

## 2019-04-30 NOTE — Telephone Encounter (Signed)
Medication is not on med list pls advise if ok to refill.Marland KitchenJohny Chess

## 2019-05-24 NOTE — Progress Notes (Signed)
Subjective:    Patient ID: [name], female    DOB: [birth year], 74 y.o.   MRN: [id number]  HPI The patient is here for follow up of their chronic medical problems, including hypertension, prediabetes, hyperlipidemia, GERD, tobacco abuse.  She is taking all of her medications as prescribed. We add hctz 25 mg daily three months ago.    She is not exercising regularly.       Medications and allergies reviewed with patient and updated if appropriate.  Patient Active Problem List   Diagnosis Date Noted  . Clavicle enlargement 07/23/2018  . Osteopenia 06/14/2018  . Achilles tendinitis 01/29/2017  . Bilateral carotid artery occlusion 01/29/2017  . Neuropathy 01/28/2017  . Prediabetes 07/24/2015  . GERD (gastroesophageal reflux disease) 07/20/2015  . History of breast cancer 07/13/2015  . Vitiligo 07/07/2012  . PLANTAR FASCIITIS 06/16/2010  . Tobacco dependence 06/14/2009  . Hyperlipidemia 11/10/2008  . ANGIONEUROTIC EDEMA 01/09/2008  . Essential hypertension 06/12/2007  . Personal history of malignant neoplasm of breast 02/25/2007  . RHEUMATIC FEVER, HX OF 02/21/2007    Current Outpatient Medications on File Prior to Visit  Medication Sig Dispense Refill  . amLODipine (NORVASC) 10 MG tablet Take 1 tablet (10 mg total) by mouth daily. 90 tablet 1  . aspirin EC 81 MG tablet Take 1 tablet (81 mg total) by mouth daily. 90 tablet 3  . atorvastatin (LIPITOR) 20 MG tablet Take 1 tablet (20 mg total) by mouth daily. 90 tablet 3  . gabapentin (NEURONTIN) 300 MG capsule TAKE 1 CAPSULE BY MOUTH EVERYDAY AT BEDTIME 90 capsule 1  . hydrochlorothiazide (HYDRODIURIL) 25 MG tablet Take 1 tablet (25 mg total) by mouth daily. 90 tablet 1  . labetalol (NORMODYNE) 300 MG tablet Take 1 tablet (300 mg total) by mouth 2 (two) times daily. 180 tablet 1  . omeprazole (PRILOSEC) 40 MG capsule Take 1 capsule (40 mg total) by mouth daily. 90 capsule 1   No current facility-administered medications  on file prior to visit.    Past Medical History:  Diagnosis Date  . Absence of menstruation    amenorrhea  . Angioneurotic edema not elsewhere classified    due to ACE-I  . Breast disorder    breast cancer 2007  . Cancer [facility]) 2007   Left Breast Cancer  . Carotid stenosis    Carotid US (8/15):  RICA 123456; LICA 123456 >>> FU 1 year  . H/O: rheumatic fever   . History of breast cancer 07/13/2015  . HLD (hyperlipidemia)   . Occlusion and stenosis of carotid artery without mention of cerebral infarction   . Other abnormal glucose    fasting hyperglycemia  . Personal history of malignant neoplasm of breast   . Personal history of radiation therapy 2007  . Unspecified essential hypertension     Past Surgical History:  Procedure Laterality Date  . APPENDECTOMY    . BREAST BIOPSY Left 12/06/2005   malignant  . BREAST LUMPECTOMY Left 01/08/2006  . BREAST LUMPECTOMY Left 01/08/2006  . COLONOSCOPY  2005   negative; Dr Collene Mares  . COLONOSCOPY N/A 09/02/2015   Procedure: COLONOSCOPY;  Surgeon: Danie Binder, MD;  Location: [facility];  Service: Endoscopy;  Laterality: N/A;  1215-moved to 1130 Ginger to notify pt  . G0 P0     Dr. Maudry Diego    Social History   Socioeconomic History  . Marital status: Single    Spouse name: Not on file  . Number of children:  Not on file  . Years of education: Not on file  . Highest education level: Not on file  Occupational History  . Not on file  Tobacco Use  . Smoking status: Current Some Day Smoker    Packs/day: 0.50    Years: 48.00    Pack years: 24.00    Types: Cigarettes  . Smokeless tobacco: Never Used  Substance and Sexual Activity  . Alcohol use: Yes    Comment: occ  . Drug use: No  . Sexual activity: Yes    Birth control/protection: Post-menopausal  Other Topics Concern  . Not on file  Social History Narrative   Single, no children.   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    Social Determinants of Health    Financial Resource Strain:   . Difficulty of Paying Living Expenses: Not on file  Food Insecurity:   . Worried About Charity fundraiser in the Last Year: Not on file  . Ran Out of Food in the Last Year: Not on file  Transportation Needs:   . Lack of Transportation (Medical): Not on file  . Lack of Transportation (Non-Medical): Not on file  Physical Activity:   . Days of Exercise per Week: Not on file  . Minutes of Exercise per Session: Not on file  Stress:   . Feeling of Stress : Not on file  Social Connections:   . Frequency of Communication with Friends and Family: Not on file  . Frequency of Social Gatherings with Friends and Family: Not on file  . Attends Religious Services: Not on file  . Active Member of Clubs or Organizations: Not on file  . Attends Archivist Meetings: Not on file  . Marital Status: Not on file    Family History  Problem Relation Age of Onset  . Breast cancer Sister   . Hypertension Other        some of Pts brothers and sisters  . Heart attack Neg Hx   . Stroke Neg Hx   . Diabetes Neg Hx   . Heart disease Neg Hx     Review of Systems     Objective:  There were no vitals filed for this visit. BP Readings from Last 3 Encounters:  02/23/19 (!) 164/82  11/19/18 (!) 226/92  07/23/18 (!) 168/88   Wt Readings from Last 3 Encounters:  02/23/19 195 lb 6.4 oz (88.6 kg)  11/19/18 194 lb (88 kg)  07/23/18 195 lb 12.8 oz (88.8 kg)   There is no height or weight on file to calculate BMI.   Physical Exam    Constitutional: Appears well-developed and well-nourished. No distress.  HENT:  Head: Normocephalic and atraumatic.  Neck: Neck supple. No tracheal deviation present. No thyromegaly present.  No cervical lymphadenopathy Cardiovascular: Normal rate, regular rhythm and normal heart sounds.   No murmur heard. No carotid bruit .  No edema Pulmonary/Chest: Effort normal and breath sounds normal. No respiratory distress. No has no  wheezes. No rales.  Skin: Skin is warm and dry. Not diaphoretic.  Psychiatric: Normal mood and affect. Behavior is normal.      Assessment & Plan:    See Problem List for Assessment and Plan of chronic medical problems.    This visit occurred during the SARS-CoV-2 public health emergency.  Safety protocols were in place, including screening questions prior to the visit, additional usage of staff PPE, and extensive cleaning of exam room while observing appropriate contact time as  indicated for disinfecting solutions.    This encounter was created in error - please disregard.

## 2019-05-24 NOTE — Patient Instructions (Signed)
  Blood work was ordered.     Medications reviewed and updated.  Changes include :     Your prescription(s) have been submitted to your pharmacy. Please take as directed and contact our office if you believe you are having problem(s) with the medication(s).  A referral was ordered for    Please followup in 6 months   

## 2019-05-25 ENCOUNTER — Encounter: Payer: Medicare HMO | Admitting: Internal Medicine

## 2019-05-25 DIAGNOSIS — Z0289 Encounter for other administrative examinations: Secondary | ICD-10-CM

## 2019-06-04 NOTE — Progress Notes (Signed)
Subjective:    Patient ID: [name], female    DOB: 07/07/1945, 74 y.o.   MRN: [id number]  HPI The patient is here for follow up of their chronic medical problems, including prediabetes, hypertension, hyperlipidemia, GERD, tobacco abuse.   She is taking all of her medications as prescribed.   She is active at work, but not exercising regularly.     She is still smoking.  She wants to quit, but does not think she can do it w/o medication - the chantix is too expensive.     Medications and allergies reviewed with patient and updated if appropriate.  Patient Active Problem List   Diagnosis Date Noted  . Clavicle enlargement 07/23/2018  . Osteopenia 06/14/2018  . Achilles tendinitis 01/29/2017  . Bilateral carotid artery occlusion 01/29/2017  . Neuropathy 01/28/2017  . Prediabetes 07/24/2015  . GERD (gastroesophageal reflux disease) 07/20/2015  . History of breast cancer 07/13/2015  . Vitiligo 07/07/2012  . PLANTAR FASCIITIS 06/16/2010  . Tobacco dependence 06/14/2009  . Hyperlipidemia 11/10/2008  . ANGIONEUROTIC EDEMA 01/09/2008  . Essential hypertension 06/12/2007  . Personal history of malignant neoplasm of breast 02/25/2007  . RHEUMATIC FEVER, HX OF 02/21/2007    Current Outpatient Medications on File Prior to Visit  Medication Sig Dispense Refill  . amLODipine (NORVASC) 10 MG tablet Take 1 tablet (10 mg total) by mouth daily. 90 tablet 1  . aspirin EC 81 MG tablet Take 1 tablet (81 mg total) by mouth daily. 90 tablet 3  . atorvastatin (LIPITOR) 20 MG tablet Take 1 tablet (20 mg total) by mouth daily. 90 tablet 3  . gabapentin (NEURONTIN) 300 MG capsule TAKE 1 CAPSULE BY MOUTH EVERYDAY AT BEDTIME 90 capsule 1  . hydrochlorothiazide (HYDRODIURIL) 25 MG tablet Take 1 tablet (25 mg total) by mouth daily. 90 tablet 1  . labetalol (NORMODYNE) 300 MG tablet Take 1 tablet (300 mg total) by mouth 2 (two) times daily. 180 tablet 1  . omeprazole (PRILOSEC) 40 MG capsule Take  1 capsule (40 mg total) by mouth daily. 90 capsule 1   No current facility-administered medications on file prior to visit.    Past Medical History:  Diagnosis Date  . Absence of menstruation    amenorrhea  . Angioneurotic edema not elsewhere classified    due to ACE-I  . Breast disorder    breast cancer 2007  . Cancer [facility]) 2007   Left Breast Cancer  . Carotid stenosis    Carotid US (8/15):  RICA 123456; LICA 123456 >>> FU 1 year  . H/O: rheumatic fever   . History of breast cancer 07/13/2015  . HLD (hyperlipidemia)   . Occlusion and stenosis of carotid artery without mention of cerebral infarction   . Other abnormal glucose    fasting hyperglycemia  . Personal history of malignant neoplasm of breast   . Personal history of radiation therapy 2007  . Unspecified essential hypertension     Past Surgical History:  Procedure Laterality Date  . APPENDECTOMY    . BREAST BIOPSY Left 12/06/2005   malignant  . BREAST LUMPECTOMY Left 01/08/2006  . BREAST LUMPECTOMY Left 01/08/2006  . COLONOSCOPY  2005   negative; Dr Collene Mares  . COLONOSCOPY N/A 09/02/2015   Procedure: COLONOSCOPY;  Surgeon: Danie Binder, MD;  Location[facility];  Service: Endoscopy;  Laterality: N/A;  1215-moved to 1130 Ginger to notify pt  . G0 P0     Dr. Maudry Diego    Social History  Socioeconomic History  . Marital status: Single    Spouse name: Not on file  . Number of children: Not on file  . Years of education: Not on file  . Highest education level: Not on file  Occupational History  . Not on file  Tobacco Use  . Smoking status: Current Some Day Smoker    Packs/day: 0.50    Years: 48.00    Pack years: 24.00    Types: Cigarettes  . Smokeless tobacco: Never Used  Substance and Sexual Activity  . Alcohol use: Yes    Comment: occ  . Drug use: No  . Sexual activity: Yes    Birth control/protection: Post-menopausal  Other Topics Concern  . Not on file  Social History Narrative   Single, no  children.   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    Social Determinants of Health   Financial Resource Strain:   . Difficulty of Paying Living Expenses: Not on file  Food Insecurity:   . Worried About Charity fundraiser in the Last Year: Not on file  . Ran Out of Food in the Last Year: Not on file  Transportation Needs:   . Lack of Transportation (Medical): Not on file  . Lack of Transportation (Non-Medical): Not on file  Physical Activity:   . Days of Exercise per Week: Not on file  . Minutes of Exercise per Session: Not on file  Stress:   . Feeling of Stress : Not on file  Social Connections:   . Frequency of Communication with Friends and Family: Not on file  . Frequency of Social Gatherings with Friends and Family: Not on file  . Attends Religious Services: Not on file  . Active Member of Clubs or Organizations: Not on file  . Attends Archivist Meetings: Not on file  . Marital Status: Not on file    Family History  Problem Relation Age of Onset  . Breast cancer Sister   . Hypertension Other        some of Pts brothers and sisters  . Heart attack Neg Hx   . Stroke Neg Hx   . Diabetes Neg Hx   . Heart disease Neg Hx     Review of Systems  Constitutional: Negative for chills and fever.  Respiratory: Negative for cough, shortness of breath and wheezing.   Cardiovascular: Negative for chest pain, palpitations and leg swelling.  Neurological: Negative for light-headedness and headaches.       Objective:   Vitals:   06/05/19 1032  BP: (!) 150/82  Pulse: 62  Resp: 16  Temp: 98.3 F (36.8 C)  SpO2: 98%   BP Readings from Last 3 Encounters:  06/05/19 (!) 150/82  02/23/19 (!) 164/82  11/19/18 (!) 226/92   Wt Readings from Last 3 Encounters:  06/05/19 197 lb (89.4 kg)  02/23/19 195 lb 6.4 oz (88.6 kg)  11/19/18 194 lb (88 kg)   Body mass index is 33.81 kg/m.   Physical Exam    Constitutional: Appears well-developed and  well-nourished. No distress.  HENT:  Head: Normocephalic and atraumatic.  Neck: Neck supple. No tracheal deviation present. No thyromegaly present.  No cervical lymphadenopathy Cardiovascular: Normal rate, regular rhythm and normal heart sounds.   2/6 systolic  murmur heard. No carotid bruit .  No edema Pulmonary/Chest: Effort normal and breath sounds normal. No respiratory distress. No has no wheezes. No rales.  Skin: Skin is warm and dry. Not diaphoretic.  Psychiatric:  Normal mood and affect. Behavior is normal.      Assessment & Plan:    See Problem List for Assessment and Plan of chronic medical problems.    This visit occurred during the SARS-CoV-2 public health emergency.  Safety protocols were in place, including screening questions prior to the visit, additional usage of staff PPE, and extensive cleaning of exam room while observing appropriate contact time as indicated for disinfecting solutions.

## 2019-06-04 NOTE — Patient Instructions (Addendum)
Your BP should be less than 140/90   Blood work was ordered.     Medications reviewed and updated.  Changes include :   Take labetalol twice a day.     Please followup in 6 months, sooner if your BP is not contolled

## 2019-06-05 ENCOUNTER — Other Ambulatory Visit: Payer: Self-pay

## 2019-06-05 ENCOUNTER — Encounter: Payer: Self-pay | Admitting: Internal Medicine

## 2019-06-05 ENCOUNTER — Ambulatory Visit (INDEPENDENT_AMBULATORY_CARE_PROVIDER_SITE_OTHER): Payer: Medicare Other | Admitting: Internal Medicine

## 2019-06-05 VITALS — BP 150/82 | HR 62 | Temp 98.3°F | Resp 16 | Ht 64.0 in | Wt 197.0 lb

## 2019-06-05 DIAGNOSIS — G629 Polyneuropathy, unspecified: Secondary | ICD-10-CM

## 2019-06-05 DIAGNOSIS — I10 Essential (primary) hypertension: Secondary | ICD-10-CM

## 2019-06-05 DIAGNOSIS — R7303 Prediabetes: Secondary | ICD-10-CM | POA: Diagnosis not present

## 2019-06-05 DIAGNOSIS — E785 Hyperlipidemia, unspecified: Secondary | ICD-10-CM

## 2019-06-05 DIAGNOSIS — K219 Gastro-esophageal reflux disease without esophagitis: Secondary | ICD-10-CM

## 2019-06-05 LAB — COMPREHENSIVE METABOLIC PANEL
ALT: 7 U/L (ref 0–35)
AST: 10 U/L (ref 0–37)
Albumin: 4.1 g/dL (ref 3.5–5.2)
Alkaline Phosphatase: 74 U/L (ref 39–117)
BUN: 16 mg/dL (ref 6–23)
CO2: 31 mEq/L (ref 19–32)
Calcium: 9.9 mg/dL (ref 8.4–10.5)
Chloride: 103 mEq/L (ref 96–112)
Creatinine, Ser: 0.91 mg/dL (ref 0.40–1.20)
GFR: 73.53 mL/min (ref >60.00)
Glucose, Bld: 84 mg/dL (ref 70–99)
Potassium: 4 mEq/L (ref 3.5–5.1)
Sodium: 141 mEq/L (ref 135–145)
Total Bilirubin: 0.7 mg/dL (ref 0.2–1.2)
Total Protein: 8.1 g/dL (ref 6.0–8.3)

## 2019-06-05 LAB — LIPID PANEL
Cholesterol: 169 mg/dL (ref 0–200)
HDL: 44.5 mg/dL (ref >39.00)
LDL Cholesterol: 97 mg/dL (ref 0–99)
NonHDL: 124.17
Total CHOL/HDL Ratio: 4
Triglycerides: 135 mg/dL (ref 0.0–149.0)
VLDL: 27 mg/dL (ref 0.0–40.0)

## 2019-06-05 LAB — HEMOGLOBIN A1C: Hgb A1c MFr Bld: 5.9 % (ref 4.6–6.5)

## 2019-06-05 NOTE — Assessment & Plan Note (Signed)
Chronic GERD controlled Continue daily medication  

## 2019-06-05 NOTE — Assessment & Plan Note (Signed)
Chronic Check lipid panel, cmp Continue daily statin Regular exercise and healthy diet encouraged  

## 2019-06-05 NOTE — Assessment & Plan Note (Signed)
Chronic Check a1c Low sugar / carb diet Stressed regular exercise  

## 2019-06-05 NOTE — Assessment & Plan Note (Addendum)
Chronic BP elevated, not ideally controlled Only taking labetalol QD Increase labetalol to 300 mg BID Continue other medications She will monitor BP at [facility] the importance of making sure her blood pressure is well controlled to avoid endorgan damage Discussed her BP needs to be consistently less than 140/90 and if it is not she needs to call or come in [facility]

## 2019-06-05 NOTE — Assessment & Plan Note (Signed)
Chronic Taking gabapentin - this is helping Continue

## 2019-06-06 ENCOUNTER — Encounter: Payer: Self-pay | Admitting: Internal Medicine

## 2019-06-22 ENCOUNTER — Other Ambulatory Visit: Payer: Self-pay | Admitting: Cardiovascular Disease

## 2019-07-14 ENCOUNTER — Encounter: Payer: Self-pay | Admitting: Internal Medicine

## 2019-08-04 NOTE — Progress Notes (Signed)
Subjective:    Patient ID: [name], female    DOB: September 12, 1945, 74 y.o.   MRN: [id number]  HPI The patient is here for an acute visit.   Dental infection:  She has right cheek swelling and a tooth ache in her right upper posterior teeth. She has poor dentition and needs to have those teeth removed.  She is looking for a dentist near where she lives with her new insurance.  She states her other dentist has been closed during the pandemic and she has not been able to make an appointment.  She has not had any fevers or chills.  She denies any discharge in her mouth.  The tooth pain is just a throbbing pain.  She took 2 Advil this morning and it has not helped.  Hypertension: She is taking her medication as prescribed.  She did increase the labetalol after her last visit.  She has only had her blood pressure checked once by family member who is a Marine scientist and she was told that it was good.  She does not know what the reading was.     Medications and allergies reviewed with patient and updated if appropriate.  Patient Active Problem List   Diagnosis Date Noted  . Clavicle enlargement 07/23/2018  . Osteopenia 06/14/2018  . Achilles tendinitis 01/29/2017  . Bilateral carotid artery occlusion 01/29/2017  . Neuropathy 01/28/2017  . Prediabetes 07/24/2015  . GERD (gastroesophageal reflux disease) 07/20/2015  . History of breast cancer 07/13/2015  . Vitiligo 07/07/2012  . PLANTAR FASCIITIS 06/16/2010  . Tobacco dependence 06/14/2009  . Hyperlipidemia 11/10/2008  . ANGIONEUROTIC EDEMA 01/09/2008  . Essential hypertension 06/12/2007  . Personal history of malignant neoplasm of breast 02/25/2007  . RHEUMATIC FEVER, HX OF 02/21/2007    Current Outpatient Medications on File Prior to Visit  Medication Sig Dispense Refill  . amLODipine (NORVASC) 10 MG tablet Take 1 tablet (10 mg total) by mouth daily. 90 tablet 1  . aspirin EC 81 MG tablet Take 1 tablet (81 mg total) by mouth daily. 90  tablet 3  . atorvastatin (LIPITOR) 20 MG tablet Take 1 tablet (20 mg total) by mouth daily. Please schedule overdue appt for further refills 30 tablet 0  . gabapentin (NEURONTIN) 300 MG capsule TAKE 1 CAPSULE BY MOUTH EVERYDAY AT BEDTIME 90 capsule 1  . hydrochlorothiazide (HYDRODIURIL) 25 MG tablet Take 1 tablet (25 mg total) by mouth daily. 90 tablet 1  . labetalol (NORMODYNE) 300 MG tablet Take 1 tablet (300 mg total) by mouth 2 (two) times daily. 180 tablet 1  . omeprazole (PRILOSEC) 40 MG capsule Take 1 capsule (40 mg total) by mouth daily. 90 capsule 1   No current facility-administered medications on file prior to visit.    Past Medical History:  Diagnosis Date  . Absence of menstruation    amenorrhea  . Angioneurotic edema not elsewhere classified    due to ACE-I  . Breast disorder    breast cancer 2007  . Cancer [facility]) 2007   Left Breast Cancer  . Carotid stenosis    Carotid US (8/15):  RICA 123456; LICA 123456 >>> FU 1 year  . H/O: rheumatic fever   . History of breast cancer 07/13/2015  . HLD (hyperlipidemia)   . Occlusion and stenosis of carotid artery without mention of cerebral infarction   . Other abnormal glucose    fasting hyperglycemia  . Personal history of malignant neoplasm of breast   . Personal history of  radiation therapy 2007  . Unspecified essential hypertension     Past Surgical History:  Procedure Laterality Date  . APPENDECTOMY    . BREAST BIOPSY Left 12/06/2005   malignant  . BREAST LUMPECTOMY Left 01/08/2006  . BREAST LUMPECTOMY Left 01/08/2006  . COLONOSCOPY  2005   negative; Dr Collene Mares  . COLONOSCOPY N/A 09/02/2015   Procedure: COLONOSCOPY;  Surgeon: Danie Binder, MD;  Location: [facility];  Service: Endoscopy;  Laterality: N/A;  1215-moved to 1130 Ginger to notify pt  . G0 P0     Dr. Maudry Diego    Social History   Socioeconomic History  . Marital status: Single    Spouse name: Not on file  . Number of children: Not on file  . Years of  education: Not on file  . Highest education level: Not on file  Occupational History  . Not on file  Tobacco Use  . Smoking status: Current Some Day Smoker    Packs/day: 0.50    Years: 48.00    Pack years: 24.00    Types: Cigarettes  . Smokeless tobacco: Never Used  Substance and Sexual Activity  . Alcohol use: Yes    Comment: occ  . Drug use: No  . Sexual activity: Yes    Birth control/protection: Post-menopausal  Other Topics Concern  . Not on file  Social History Narrative   Single, no children.   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    Social Determinants of Health   Financial Resource Strain:   . Difficulty of Paying Living Expenses:   Food Insecurity:   . Worried About Charity fundraiser in the Last Year:   . Arboriculturist in the Last Year:   Transportation Needs:   . Film/video editor (Medical):   Marland Kitchen Lack of Transportation (Non-Medical):   Physical Activity:   . Days of Exercise per Week:   . Minutes of Exercise per Session:   Stress:   . Feeling of Stress :   Social Connections:   . Frequency of Communication with Friends and Family:   . Frequency of Social Gatherings with Friends and Family:   . Attends Religious Services:   . Active Member of Clubs or Organizations:   . Attends Archivist Meetings:   Marland Kitchen Marital Status:     Family History  Problem Relation Age of Onset  . Breast cancer Sister   . Hypertension Other        some of Pts brothers and sisters  . Heart attack Neg Hx   . Stroke Neg Hx   . Diabetes Neg Hx   . Heart disease Neg Hx     Review of Systems  Constitutional: Negative for chills and fever.  HENT: Positive for dental problem. Negative for sore throat and trouble swallowing.   Neurological: Positive for headaches. Negative for light-headedness.       Objective:   Vitals:   08/05/19 1129  BP: (!) 174/82  Pulse: (!) 55  Resp: 16  Temp: 98.3 F (36.8 C)  SpO2: 98%   BP Readings from Last 3  Encounters:  08/05/19 (!) 174/82  06/05/19 (!) 150/82  02/23/19 (!) 164/82   Wt Readings from Last 3 Encounters:  08/05/19 201 lb (91.2 kg)  06/05/19 197 lb (89.4 kg)  02/23/19 195 lb 6.4 oz (88.6 kg)   Body mass index is 34.5 kg/m.   Physical Exam    Constitutional: Appears well-developed and well-nourished. No  distress.  Head: Normocephalic and atraumatic.  Neck, Mouth: Neck supple. No tracheal deviation present. No thyromegaly present.  No cervical lymphadenopathy.  Right cheek swollen.  Oropharynx w/o erythema or exudate.  Poor dentition.  No obvious abscess or discharge.no gum swelling. Cardiovascular: Normal rate, regular rhythm and normal heart sounds.  2/6 sys murmur heard. No carotid bruit .  No edema Pulmonary/Chest: Effort normal and breath sounds normal. No respiratory distress. No has no wheezes. No rales.  Skin: Skin is warm and dry. Not diaphoretic.  Psychiatric: Normal mood and affect. Behavior is normal.        Assessment & Plan:    See Problem List for Assessment and Plan of chronic medical problems.    This visit occurred during the SARS-CoV-2 public health emergency.  Safety protocols were in place, including screening questions prior to the visit, additional usage of staff PPE, and extensive cleaning of exam room while observing appropriate contact time as indicated for disinfecting solutions.

## 2019-08-05 ENCOUNTER — Other Ambulatory Visit: Payer: Self-pay

## 2019-08-05 ENCOUNTER — Ambulatory Visit (INDEPENDENT_AMBULATORY_CARE_PROVIDER_SITE_OTHER): Payer: Medicare Other | Admitting: Internal Medicine

## 2019-08-05 ENCOUNTER — Encounter: Payer: Self-pay | Admitting: Internal Medicine

## 2019-08-05 VITALS — BP 174/82 | HR 55 | Temp 98.3°F | Resp 16 | Ht 64.0 in | Wt 201.0 lb

## 2019-08-05 DIAGNOSIS — K047 Periapical abscess without sinus: Secondary | ICD-10-CM | POA: Insufficient documentation

## 2019-08-05 DIAGNOSIS — I10 Essential (primary) hypertension: Secondary | ICD-10-CM

## 2019-08-05 MED ORDER — HYDRALAZINE HCL 25 MG PO TABS: 50.0000 mg | ORAL_TABLET | Freq: Two times a day (BID) | ORAL | 5 refills | Status: DC

## 2019-08-05 MED ORDER — AMOXICILLIN-POT CLAVULANATE 875-125 MG PO TABS: 1.0000 | ORAL_TABLET | Freq: Two times a day (BID) | ORAL | 0 refills | Status: DC

## 2019-08-05 MED ORDER — HYDROCODONE-ACETAMINOPHEN 5-325 MG PO TABS: 1.0000 | ORAL_TABLET | Freq: Three times a day (TID) | ORAL | 0 refills | Status: DC | PRN

## 2019-08-05 NOTE — Assessment & Plan Note (Signed)
Acute Symptoms and exam consistent with dental infection She is working on making an appointment with the dentist infected tooth removed Will start Augmentin twice daily x10 days Discussed the antibiotic should help, but not as a permanent solution that she needs to get in with her dentist Continue Advil for mild-moderate I will prescribe 2 days worth of Vicodin for more severe pain-[place] controlled substance database checked.  She has taken this medication in the past and tolerated it without side effects

## 2019-08-05 NOTE — Assessment & Plan Note (Signed)
Chronic Blood pressure not well controlled Continue current medications Start hydralazine 50 mg twice daily Encouraged her to try to have her blood pressure checked at home Call with any concerns

## 2019-08-05 NOTE — Patient Instructions (Addendum)
   Medications reviewed and updated.  Changes include :     For your dental infection : augmentin and vicodin For your blood pressure : hydralazine 50 mg twice daily  Your prescription(s) have been submitted to your pharmacy. Please take as directed and contact our office if you believe you are having problem(s) with the medication(s).    Monitor your BP at home

## 2019-08-11 ENCOUNTER — Telehealth: Payer: Self-pay | Admitting: Internal Medicine

## 2019-08-11 DIAGNOSIS — I6523 Occlusion and stenosis of bilateral carotid arteries: Secondary | ICD-10-CM

## 2019-08-11 NOTE — Progress Notes (Signed)
  Chronic Care Management   Note  08/11/2019 Name: Meg Bourlier MRN: [id number] DOB: [birth year]  Adelyne Bensch is a 74 y.o. year old female who is a primary care patient of Burns, Claudina Lick, MD. I reached out to [employer] by phone today in response to a referral sent by Ms. Georgann Dutko's PCP, Binnie Rail, MD.   Ms. Disalvo was given information about Chronic Care Management services today including:  1. CCM service includes personalized support from designated clinical staff supervised by her physician, including individualized plan of care and coordination with other care providers 2. 24/7 contact phone numbers for assistance for urgent and routine care needs. 3. Service will only be billed when office clinical staff spend 20 minutes or more in a month to coordinate care. 4. Only one practitioner may furnish and bill the service in a calendar month. 5. The patient may stop CCM services at any time (effective at the end of the month) by phone call to the office staff.   Patient agreed to services and verbal consent obtained.    This note is not being shared with the patient for the following reason: To respect privacy (The patient or proxy has requested that the information not be shared).  Follow up plan:   Raynicia Dukes [employer] Scheduler

## 2019-09-15 ENCOUNTER — Other Ambulatory Visit: Payer: Self-pay | Admitting: Internal Medicine

## 2019-09-22 NOTE — Chronic Care Management (AMB) (Deleted)
Chronic Care Management Pharmacy  Name: [name]  MRN: [id number] DOB: 24-Aug-1945  Chief Complaint/ HPI  Dala Dock,  74 y.o. , female presents for their Initial CCM visit with the clinical pharmacist via telephone due to COVID-19 Pandemic.  PCP : Binnie Rail, MD  Their chronic conditions include: HTN, GERD, neuropathy, osteopenia, hyperlipidemia, tobacco dependence, pre-diabetes   Office Visits: 08/05/19: Patient presented to Dr. Quay Burow for HTN follow-up. BP in clinic 174/82. Patient with tooth ache and cheek swelling. Patient in process of establishing with new dentist. Patient started on hydralazine 50 mg BID for elevated BP, Augmentin x 10 days, Norco #12 for tooth infection.  06/05/19: Patient presented to Dr. Quay Burow for HTN follow-up. BP in clinic 150/82. Patient wants to quit smoking, but concerned about cost. [name] too expensive. Labetalol increased to 300 mg BID.   Consult Visit: none noted in past 6 months   Allergies  Allergen Reactions  . Benazepril Hcl     REACTION: FACE SWELLING (ANGIOEDEMA); she can not take ARBS  !!! Because of a history of documented adverse serious drug reaction;Medi Alert bracelet  is recommended  . Peanut-Containing Drug Products     03/13/13 hivesw/o facial swelling  . Shellfish Allergy     03/15/13 hives & facial swelling  . Yellow Dyes (Non-Tartrazine)     12/14 hives  & facial swelling with Christene Lye Aid    Medications: Outpatient Encounter Medications as of 09/23/2019  Medication Sig  . amLODipine (NORVASC) 10 MG tablet TAKE 1 TABLET BY MOUTH EVERY DAY  . amoxicillin-clavulanate (AUGMENTIN) 875-125 MG tablet Take 1 tablet by mouth 2 (two) times daily.  Marland Kitchen aspirin EC 81 MG tablet Take 1 tablet (81 mg total) by mouth daily.  Marland Kitchen atorvastatin (LIPITOR) 20 MG tablet Take 1 tablet (20 mg total) by mouth daily. Please schedule overdue appt for further refills  . gabapentin (NEURONTIN) 300 MG capsule TAKE 1 CAPSULE BY MOUTH EVERYDAY AT  BEDTIME  . hydrALAZINE (APRESOLINE) 25 MG tablet Take 2 tablets (50 mg total) by mouth in the morning and at bedtime.  . hydrochlorothiazide (HYDRODIURIL) 25 MG tablet TAKE 1 TABLET BY MOUTH EVERY DAY  . HYDROcodone-acetaminophen (NORCO/VICODIN) 5-325 MG tablet Take 1-2 tablets by mouth every 8 (eight) hours as needed for severe pain.  Marland Kitchen labetalol (NORMODYNE) 300 MG tablet Take 1 tablet (300 mg total) by mouth 2 (two) times daily.  Marland Kitchen omeprazole (PRILOSEC) 40 MG capsule Take 1 capsule (40 mg total) by mouth daily.   No facility-administered encounter medications on file as of 09/23/2019.     Current Diagnosis/Assessment:  Goals Addressed   None     Hypertension   BP goal is:  <130/80  Office blood pressures are  BP Readings from Last 3 Encounters:  08/05/19 (!) 174/82  06/05/19 (!) 150/82  02/23/19 (!) 164/82   Kidney Function Lab Results  Component Value Date/Time   CREATININE 0.91 06/05/2019 11:22 AM   CREATININE 1.01 02/23/2019 03:22 PM   GFR 73.53 06/05/2019 11:22 AM   GFRNONAA 66.69 01/18/2010 12:00 AM   K 4.0 06/05/2019 11:22 AM   K 3.7 02/23/2019 03:22 PM   Patient checks BP at home {CHL HP BP Monitoring Frequency:[phone]}  Patient home BP readings are ranging: ***  Patient has failed these meds in the past: benazepril (Angioedema), spironolactone   Patient is currently {CHL Controlled/[phone]} on the following medications:   Amlodipine 10 mg daily   Hydralazine 25 two tab BID  HCTZ 25 mg  daily   Labetalol 300 mg BID  We discussed {CHL HP Upstream Pharmacy [phone]}  Plan  Continue {CHL HP Upstream Pharmacy Plans:[phone]}   Hyperlipidemia   LDL goal < 100  Lipid Panel     Component Value Date/Time   CHOL 169 06/05/2019 1122   CHOL 168 05/18/2016 0844   TRIG 135.0 06/05/2019 1122   HDL 44.50 06/05/2019 1122   HDL 49 05/18/2016 0844   LDLCALC 97 06/05/2019 1122   LDLCALC 100 (H) 05/18/2016 0844   LDLDIRECT  141.8 07/31/2012 1537    Hepatic Function Latest Ref Rng & Units 06/05/2019 02/23/2019 11/19/2018  Total Protein 6.0 - 8.3 g/dL 8.1 7.7 8.1  Albumin 3.5 - 5.2 g/dL 4.1 4.2 4.4  AST 0 - 37 U/L '10 11 9  ' ALT 0 - 35 U/L '7 7 7  ' Alk Phosphatase 39 - 117 U/L 74 77 86  Total Bilirubin 0.2 - 1.2 mg/dL 0.7 0.6 0.6  Bilirubin, Direct 0.00 - 0.40 mg/dL - - -   The 10-year ASCVD risk score Mikey Bussing DC Jr., et al., 2013) is: 32.9%   Values used to calculate the score:     Age: 83 years     Sex: Female     Is Non-Hispanic African American: Yes     Diabetic: No     Tobacco smoker: Yes     Systolic Blood Pressure: 575 mmHg     Is BP treated: Yes     HDL Cholesterol: 44.5 mg/dL     Total Cholesterol: 169 mg/dL   Patient has failed these meds in past: pravastatin Patient is currently {CHL Controlled/Uncontrolled:[phone]} on the following medications:   Aspirin 81 mg daily   Atorvastatin 20 mg daily   We discussed:  {CHL HP Upstream Pharmacy [phone]}  Plan  Continue {CHL HP Upstream Pharmacy Plans:[phone]}  Prediabetes   Recent Relevant Labs: Lab Results  Component Value Date/Time   HGBA1C 5.9 06/05/2019 11:22 AM   HGBA1C 5.8 02/23/2019 03:22 PM   GFR 73.53 06/05/2019 11:22 AM   GFR 65.24 02/23/2019 03:22 PM    No medication indicated.  We discussed: {CHL HP Upstream Pharmacy [phone]}  Plan  Continue {CHL HP Upstream Pharmacy YNXGZ:[id number]}  Tobacco Use   Tobacco Status:  Social History   Tobacco Use  Smoking Status Current Some Day Smoker  . Packs/day: 0.50  . Years: 48.00  . Pack years: 24.00  . Types: Cigarettes  Smokeless Tobacco Never Used   Patient smokes {Time to first cigarette:23873} Patient triggers include: {Smoking Triggers:23882} On a scale of 1-10, reports MOTIVATION to quit is *** On a scale of 1-10, reports CONFIDENCE in quitting is ***  Patient has failed these meds in past: bupropion (made her smoke more) Patient  is currently {CHL Controlled/Uncontrolled:[phone]} on the following medications: ***  We discussed:  {Smoking Cessation Counseling:23883}  Plan  Continue {CHL HP Upstream Pharmacy Plans:[phone]}  Osteopenia   Last DEXA Scan: 06/12/18   T-Score femoral neck: -0.9  T-Score total hip: 0.0  T-Score lumbar spine: n/a  T-Score forearm radius: -1.4  10-year probability of major osteoporotic fracture: 3.7%  10-year probability of hip fracture: 0.4%  Patient {is;is not an osteoporosis candidate:23886}  Patient has failed these meds in past: *** Patient is currently {CHL Controlled/Uncontrolled:[phone]} on the following medications:   No medications  We discussed:  {Osteoporosis Counseling:23892}  Plan  Continue {CHL HP Upstream Pharmacy Plans:[phone]}  Chronic Pain/Neuropathy   Patient has failed these meds in past: *** Patient is currently {  CHL Controlled/Uncontrolled:3862784008} on the following medications:   Gabapentin 300 mg QHS   Hydrocodone-APAP 5-325 1-2 tab q8hr PRN   We discussed:  ***  Plan  Continue {CHL HP Upstream Pharmacy [phone]}  GERD   Patient has failed these meds in past: *** Patient is currently {CHL Controlled/Uncontrolled:3862784008} on the following medications:   Omeprazole 40 mg daily   We discussed:  ***  Plan  Continue {CHL HP Upstream Pharmacy PULGS:[id number]}  Vaccines   Reviewed and discussed patient's vaccination history.    Immunization History  Administered Date(s) Administered  . Moderna SARS-COVID-2 Vaccination 06/20/2019, 07/18/2019    Plan  Recommended patient receive *** vaccine in *** office/pharmacy.   Medication Management   Pt uses [facility] for all medications. There is a >5 fill gap for all chronic medications on external fill history.  Uses pill box? {Yes or If no, why not?:20788} Pt endorses ***% compliance  We discussed: ***  Plan  {[facility] QPEA:83507}    Follow up:  *** month phone visit  ***

## 2019-09-22 NOTE — Chronic Care Management (AMB) (Deleted)
Chronic Care Management Pharmacy  Name: [name]  MRN: [id number] DOB: [birth year]  Chief Complaint/ HPI  [name],  74 y.o. , female presents for their Initial CCM visit with the clinical pharmacist via telephone due to COVID-19 Pandemic.  PCP : Binnie Rail, MD  Their chronic conditions include: HTN, GERD, neuropathy, osteopenia, hyperlipidemia, tobacco dependence, pre-diabetes   Office Visits: 08/05/19: Patient presented to Dr. Quay Burow for HTN follow-up. BP in clinic 174/82. Patient with tooth ache and cheek swelling. Patient in process of establishing with new dentist. Patient started on hydralazine 50 mg BID for elevated BP, Augmentin x 10 days, Norco #12 for tooth infection. 06/05/19: Patient presented to Dr. Quay Burow for HTN follow-up. BP in clinic 150/82. Patient wants to quit smoking, but concerned about cost. [name] too expensive. Labetalol increased to 300 mg BID.   Consult Visit: none noted in past 6 months   Medications: Outpatient Encounter Medications as of 09/23/2019  Medication Sig  . amLODipine (NORVASC) 10 MG tablet TAKE 1 TABLET BY MOUTH EVERY DAY  . amoxicillin-clavulanate (AUGMENTIN) 875-125 MG tablet Take 1 tablet by mouth 2 (two) times daily.  Marland Kitchen aspirin EC 81 MG tablet Take 1 tablet (81 mg total) by mouth daily.  Marland Kitchen atorvastatin (LIPITOR) 20 MG tablet Take 1 tablet (20 mg total) by mouth daily. Please schedule overdue appt for further refills  . gabapentin (NEURONTIN) 300 MG capsule TAKE 1 CAPSULE BY MOUTH EVERYDAY AT BEDTIME  . hydrALAZINE (APRESOLINE) 25 MG tablet Take 2 tablets (50 mg total) by mouth in the morning and at bedtime.  . hydrochlorothiazide (HYDRODIURIL) 25 MG tablet TAKE 1 TABLET BY MOUTH EVERY DAY  . HYDROcodone-acetaminophen (NORCO/VICODIN) 5-325 MG tablet Take 1-2 tablets by mouth every 8 (eight) hours as needed for severe pain.  Marland Kitchen labetalol (NORMODYNE) 300 MG tablet Take 1 tablet (300 mg total) by mouth 2 (two) times daily.  Marland Kitchen omeprazole  (PRILOSEC) 40 MG capsule Take 1 capsule (40 mg total) by mouth daily.   No facility-administered encounter medications on file as of 09/23/2019.     Current Diagnosis/Assessment:  Goals Addressed   None    Pre-Diabetes   Recent Relevant Labs: Lab Results  Component Value Date/Time   HGBA1C 5.9 06/05/2019 11:22 AM   HGBA1C 5.8 02/23/2019 03:22 PM     Checking BG: Never  Recent FBG Readings: n/a Recent pre-meal BG readings: n/a Recent 2hr PP BG readings:  n/a Recent HS BG readings: n/a Patient has failed these meds in past: n/a Patient is currently controlled on the following medications: None  Last diabetic Foot exam: No results found for: HMDIABEYEEXA  Last diabetic Eye exam: No results found for: HMDIABFOOTEX   We discussed: {CHL HP Upstream Pharmacy [phone]}  Plan  Continue {CHL HP Upstream Pharmacy Plans:[phone]}  Hypertension   BP today is:  {CHL HP UPSTREAM Pharmacist BP ranges:[phone]}  Office blood pressures are  BP Readings from Last 3 Encounters:  08/05/19 (!) 174/82  06/05/19 (!) 150/82  02/23/19 (!) 164/82   CMP Latest Ref Rng & Units 06/05/2019 02/23/2019 11/19/2018  Glucose 70 - 99 mg/dL 84 100(H) 91  BUN 6 - 23 mg/dL 16 18 19   Creatinine 0.40 - 1.20 mg/dL 0.91 1.01 0.97  Sodium 135 - 145 mEq/L 141 137 139  Potassium 3.5 - 5.1 mEq/L 4.0 3.7 4.0  Chloride 96 - 112 mEq/L 103 103 102  CO2 19 - 32 mEq/L 31 24 28   Calcium 8.4 - 10.5 mg/dL 9.9 9.3 9.7  Total Protein 6.0 - 8.3 g/dL 8.1 7.7 8.1  Total Bilirubin 0.2 - 1.2 mg/dL 0.7 0.6 0.6  Alkaline Phos 39 - 117 U/L 74 77 86  AST 0 - 37 U/L 10 11 9   ALT 0 - 35 U/L 7 7 7      Patient has failed these meds in the past: benazepril (Angioedema), spironolactone   Patient is currently {CHL Controlled/Uncontrolled:[phone]} on the following medications:   Amlodipine 10 mg daily   Hydralazine 25 two tab BID  HCTZ 25 mg daily   Labetalol 300 mg BID Patient checks BP at home {CHL HP  BP Monitoring Frequency:[id number]}  Patient home BP readings are ranging: ***  We discussed {CHL HP Upstream Pharmacy [phone]}  Plan  Continue {CHL HP Upstream Pharmacy Plans:[phone]}   Hyperlipidemia   Lipid Panel     Component Value Date/Time   CHOL 169 06/05/2019 1122   CHOL 168 05/18/2016 0844   TRIG 135.0 06/05/2019 1122   HDL 44.50 06/05/2019 1122   HDL 49 05/18/2016 0844   LDLCALC 97 06/05/2019 1122   LDLCALC 100 (H) 05/18/2016 0844   LDLDIRECT 141.8 07/31/2012 1537     The 10-year ASCVD risk score Mikey Bussing DC Jr., et al., 2013) is: 32.9%   Values used to calculate the score:     Age: 62 years     Sex: Female     Is Non-Hispanic African American: Yes     Diabetic: No     Tobacco smoker: Yes     Systolic Blood Pressure: 154 mmHg     Is BP treated: Yes     HDL Cholesterol: 44.5 mg/dL     Total Cholesterol: 169 mg/dL   Patient has failed these meds in past: pravastatin Patient is currently {CHL Controlled/Uncontrolled:[phone]} on the following medications:   Aspirin 81 mg daily   Atorvastatin 20 mg daily   We discussed:  {CHL HP Upstream Pharmacy [phone]}  Plan  Continue {CHL HP Upstream Pharmacy Plans:[phone]}  Tobacco Abuse   Tobacco Status:  Social History   Tobacco Use  Smoking Status Current Some Day Smoker  . Packs/day: 0.50  . Years: 48.00  . Pack years: 24.00  . Types: Cigarettes  Smokeless Tobacco Never Used    Patient smokes {Time to first cigarette:23873} Patient triggers include: {Smoking Triggers:23882} On a scale of 1-10, reports MOTIVATION to quit is *** On a scale of 1-10, reports CONFIDENCE in quitting is ***  Patient has failed these meds in past: *** Patient is currently {CHL Controlled/Uncontrolled:[phone]} on the following medications: ***  We discussed:  {Smoking Cessation Counseling:23883}  Plan  Continue {CHL HP Upstream Pharmacy Plans:[phone]}  Osteopenia   Last DEXA  Scan: 06/12/18   T-Score femoral neck: -0.9  T-Score total hip: 0.0  T-Score lumbar spine: n/a  T-Score forearm radius: -1.4  10-year probability of major osteoporotic fracture: 3.7%  10-year probability of hip fracture: 0.4%  Vit D, 25-Hydroxy  Date Value Ref Range Status  08/23/2009 18 (L) 30 - 89 ng/mL Final    Comment:    This assay accurately quantifies Vitamin D, which is the sum of the25-Hydroxy forms of Vitamin D2 and D3.  Studies have shown that theoptimum concentration of 25-Hydroxy Vitamin D is 30 ng/mL or higher. Concentrations of Vitamin D between 20 and 29 ng/mL  are considered tobe insufficient and concentrations less than 20 ng/mL are consideredto be deficient for Vitamin D.     Patient {is;is not an osteoporosis candidate:23886}  Patient has failed these  meds in past: *** Patient is currently {CHL Controlled/[phone]} on the following medications: ***  We discussed:  {Osteoporosis Counseling:23892}  Plan  Continue {CHL HP Upstream Pharmacy [phone]}  Chronic Pain   Neuropathy  Patient has failed these meds in past: *** Patient is currently {CHL Controlled/[phone]} on the following medications:   Gabapentin 300 mg QHS   Hydrocodone-APAP 5-325 1-2 tab q8hr PRN   We discussed:  ***  Plan  Continue {CHL HP Upstream Pharmacy [phone]}  GERD   Patient has failed these meds in past: *** Patient is currently {CHL Controlled/[phone]} on the following medications:   Omeprazole 40 mg daily   We discussed:  ***  Plan  Continue {CHL HP Upstream Pharmacy XUXYB:[id number]}  Vaccines   Reviewed and discussed patient's vaccination history.    Immunization History  Administered Date(s) Administered  . Moderna SARS-COVID-2 Vaccination 06/20/2019, 07/18/2019    Plan  Recommended patient receive *** vaccine in *** office/pharmacy.   Medication Management   [facility]. There is a >5 fill gap for all chronic medications on external fill history.  Uses pill box? {Yes or If no, why not?:20788} Pt endorses ***% compliance  We discussed: ***  Plan  {[facility] OMAY:04599}    Follow up: *** month phone visit  ***

## 2019-09-22 NOTE — Addendum Note (Signed)
Addended by: CAIRRIKIER DAVIDSON, Deforrest Bogle M on: 09/22/2019 09:02 AM   Modules accepted: Orders  

## 2019-09-23 ENCOUNTER — Telehealth: Payer: Medicare Other

## 2019-10-03 ENCOUNTER — Other Ambulatory Visit: Payer: Self-pay

## 2019-10-03 ENCOUNTER — Emergency Department (HOSPITAL_COMMUNITY)
Admission: EM | Admit: 2019-10-03 | Discharge: 2019-10-03 | Disposition: A | Discharge status: Home / Self Care | Payer: Medicare Other | Attending: Emergency Medicine | Admitting: Emergency Medicine

## 2019-10-03 ENCOUNTER — Encounter (HOSPITAL_COMMUNITY): Payer: Self-pay | Admitting: Emergency Medicine

## 2019-10-03 DIAGNOSIS — Z7982 Long term (current) use of aspirin: Secondary | ICD-10-CM | POA: Insufficient documentation

## 2019-10-03 DIAGNOSIS — Z79899 Other long term (current) drug therapy: Secondary | ICD-10-CM | POA: Diagnosis not present

## 2019-10-03 DIAGNOSIS — E785 Hyperlipidemia, unspecified: Secondary | ICD-10-CM | POA: Diagnosis not present

## 2019-10-03 DIAGNOSIS — M436 Torticollis: Secondary | ICD-10-CM | POA: Insufficient documentation

## 2019-10-03 DIAGNOSIS — I10 Essential (primary) hypertension: Secondary | ICD-10-CM | POA: Diagnosis not present

## 2019-10-03 DIAGNOSIS — F17210 Nicotine dependence, cigarettes, uncomplicated: Secondary | ICD-10-CM | POA: Insufficient documentation

## 2019-10-03 DIAGNOSIS — M542 Cervicalgia: Secondary | ICD-10-CM | POA: Diagnosis present

## 2019-10-03 MED ORDER — METHOCARBAMOL 500 MG PO TABS: 500.0000 mg | ORAL_TABLET | Freq: Three times a day (TID) | ORAL | 0 refills | Status: DC

## 2019-10-03 MED ORDER — NAPROXEN 500 MG PO TABS: 500.0000 mg | ORAL_TABLET | Freq: Two times a day (BID) | ORAL | 0 refills | Status: DC

## 2019-10-03 MED ORDER — METHOCARBAMOL 500 MG PO TABS
500.0000 mg | ORAL_TABLET | Freq: Once | ORAL | Status: AC
  Administered 2019-10-03: 500 mg via ORAL
  Filled 2019-10-03: qty 1

## 2019-10-03 MED ORDER — KETOROLAC TROMETHAMINE 30 MG/ML IJ SOLN
30.0000 mg | Freq: Once | INTRAMUSCULAR | Status: AC
  Administered 2019-10-03: 30 mg via INTRAMUSCULAR
  Filled 2019-10-03: qty 1

## 2019-10-03 NOTE — ED Provider Notes (Signed)
[facility] EMERGENCY DEPARTMENT Provider Note   CSN: [id number] Arrival date & time: 10/03/19  1539     History Chief Complaint  Patient presents with  . Neck Pain    [name] is a 74 y.o. female.  HPI      [name] is a 74 y.o. female with past medical history of breast cancer 2007, hypertension, who presents to the Emergency Department complaining of bilateral neck pain x2 days.  States that she woke with pain to the bilateral neck.  Pain is gradually worsening.  She has tried over-the-counter muscle rubs and warm heat without relief.  She describes an aching pain with any attempted movement of her neck.  Pain improves when she keeps her head still.  She took 1 hydrocodone tablet last evening at bedtime which provided some relief.  She denies injury, headache, dizziness, numbness or weakness of her extremities, visual changes, fever or chills.   Past Medical History:  Diagnosis Date  . Absence of menstruation    amenorrhea  . Angioneurotic edema not elsewhere classified    due to ACE-I  . Breast disorder    breast cancer 2007  . Cancer [facility]) 2007   Left Breast Cancer  . Carotid stenosis    Carotid US (8/15):  RICA 25-36%; LICA 64-40% >>> FU 1 year  . H/O: rheumatic fever   . History of breast cancer 07/13/2015  . HLD (hyperlipidemia)   . Occlusion and stenosis of carotid artery without mention of cerebral infarction   . Other abnormal glucose    fasting hyperglycemia  . Personal history of malignant neoplasm of breast   . Personal history of radiation therapy 2007  . Unspecified essential hypertension     Patient Active Problem List   Diagnosis Date Noted  . Dental infection 08/05/2019  . Clavicle enlargement 07/23/2018  . Osteopenia 06/14/2018  . Achilles tendinitis 01/29/2017  . Bilateral carotid artery occlusion 01/29/2017  . Neuropathy 01/28/2017  . Prediabetes 07/24/2015  . GERD (gastroesophageal reflux disease) 07/20/2015  . History of breast  cancer 07/13/2015  . Vitiligo 07/07/2012  . PLANTAR FASCIITIS 06/16/2010  . Tobacco dependence 06/14/2009  . Hyperlipidemia 11/10/2008  . ANGIONEUROTIC EDEMA 01/09/2008  . Essential hypertension 06/12/2007  . Personal history of malignant neoplasm of breast 02/25/2007  . RHEUMATIC FEVER, HX OF 02/21/2007    Past Surgical History:  Procedure Laterality Date  . APPENDECTOMY    . BREAST BIOPSY Left 12/06/2005   malignant  . BREAST LUMPECTOMY Left 01/08/2006  . BREAST LUMPECTOMY Left 01/08/2006  . COLONOSCOPY  2005   negative; Dr Collene Mares  . COLONOSCOPY N/A 09/02/2015   Procedure: COLONOSCOPY;  Surgeon: Danie Binder, MD;  Location: [facility] ENDO SUITE;  Service: Endoscopy;  Laterality: N/A;  1215-moved to 1130 Ginger to notify pt  . G0 P0     Dr. Maudry Diego     OB History    Gravida  0   Para  0   Term  0   Preterm  0   AB  0   Living  0     SAB  0   TAB  0   Ectopic  0   Multiple  0   Live Births              Family History  Problem Relation Age of Onset  . Breast cancer Sister   . Hypertension Other        some of Pts brothers and sisters  . Heart  attack Neg Hx   . Stroke Neg Hx   . Diabetes Neg Hx   . Heart disease Neg Hx     Social History   Tobacco Use  . Smoking status: Current Some Day Smoker    Packs/day: 0.50    Years: 48.00    Pack years: 24.00    Types: Cigarettes  . Smokeless tobacco: Never Used  Vaping Use  . Vaping Use: Never used  Substance Use Topics  . Alcohol use: Yes    Comment: occ  . Drug use: No    Home Medications Prior to Admission medications   Medication Sig Start Date End Date Taking? Authorizing Provider  amLODipine (NORVASC) 10 MG tablet TAKE 1 TABLET BY MOUTH EVERY DAY 09/15/19   Binnie Rail, MD  amoxicillin-clavulanate (AUGMENTIN) 875-125 MG tablet Take 1 tablet by mouth 2 (two) times daily. 08/05/19   Binnie Rail, MD  aspirin EC 81 MG tablet Take 1 tablet (81 mg total) by mouth daily. 05/23/17   Sherren Mocha,  MD  atorvastatin (LIPITOR) 20 MG tablet Take 1 tablet (20 mg total) by mouth daily. Please schedule overdue appt for further refills 06/22/19   Sherren Mocha, MD  gabapentin (NEURONTIN) 300 MG capsule TAKE 1 CAPSULE BY MOUTH EVERYDAY AT BEDTIME 04/30/19   Binnie Rail, MD  hydrALAZINE (APRESOLINE) 25 MG tablet Take 2 tablets (50 mg total) by mouth in the morning and at bedtime. 08/05/19   Binnie Rail, MD  hydrochlorothiazide (HYDRODIURIL) 25 MG tablet TAKE 1 TABLET BY MOUTH EVERY DAY 09/15/19   Binnie Rail, MD  HYDROcodone-acetaminophen (NORCO/VICODIN) 5-325 MG tablet Take 1-2 tablets by mouth every 8 (eight) hours as needed for severe pain. 08/05/19   Binnie Rail, MD  labetalol (NORMODYNE) 300 MG tablet Take 1 tablet (300 mg total) by mouth 2 (two) times daily. 02/23/19   Binnie Rail, MD  omeprazole (PRILOSEC) 40 MG capsule Take 1 capsule (40 mg total) by mouth daily. 02/23/19   Binnie Rail, MD    Allergies    Benazepril hcl, Peanut-containing drug products, Shellfish allergy, and Yellow dyes (non-tartrazine)  Review of Systems   Review of Systems  Constitutional: Negative for chills and fever.  Eyes: Negative for visual disturbance.  Cardiovascular: Negative for chest pain.  Gastrointestinal: Negative for abdominal pain, nausea and vomiting.  Genitourinary: Negative for difficulty urinating and dysuria.  Musculoskeletal: Positive for neck pain. Negative for arthralgias, back pain and joint swelling.  Skin: Negative for color change and wound.  Neurological: Negative for dizziness, facial asymmetry, speech difficulty, weakness, numbness and headaches.    Physical Exam Updated Vital Signs BP (S) (!) 195/77 (BP Location: Right Arm)   Pulse 80   Temp 98.1 F (36.7 C) (Oral)   Resp 16   Ht 5' 4.5" (1.638 m)   Wt 88.9 kg   SpO2 98%   BMI 33.12 kg/m   Physical Exam Vitals and nursing note reviewed.  Constitutional:      Appearance: Normal appearance. She is not  ill-appearing or toxic-appearing.  HENT:     Head: Atraumatic.     Right Ear: Tympanic membrane and ear canal normal.     Left Ear: Tympanic membrane and ear canal normal.  Eyes:     Extraocular Movements: Extraocular movements intact.     Conjunctiva/sclera: Conjunctivae normal.     Pupils: Pupils are equal, round, and reactive to light.  Neck:     Trachea: Phonation normal.  Meningeal: Kernig's sign absent.     Comments: Tenderness to palpation along the bilateral SCM muscles.  No tenderness of the trapezius or rhomboid muscles. Cardiovascular:     Rate and Rhythm: Normal rate and regular rhythm.     Pulses: Normal pulses.  Pulmonary:     Effort: Pulmonary effort is normal.     Breath sounds: Normal breath sounds.  Chest:     Chest wall: No tenderness.  Musculoskeletal:     Cervical back: Tenderness present. No edema, erythema, rigidity or crepitus. Muscular tenderness present.  Lymphadenopathy:     Cervical: No cervical adenopathy.  Skin:    General: Skin is warm.     Capillary Refill: Capillary refill takes less than 2 seconds.     Findings: No rash.  Neurological:     General: No focal deficit present.     Mental Status: She is alert.     GCS: GCS eye subscore is 4. GCS verbal subscore is 5. GCS motor subscore is 6.     Sensory: Sensation is intact. No sensory deficit.     Motor: Motor function is intact. No weakness.     Coordination: Coordination is intact.     Comments: CN II_XII intact, speech clear, no pronator drift, grip strengths strong and symmetrical bilaterally normal finger-nose testing     ED Results / Procedures / Treatments   Labs (all labs ordered are listed, but only abnormal results are displayed) Labs Reviewed - No data to display  EKG None  Radiology No results found.  Procedures Procedures (including critical care time)  Medications Ordered in ED Medications  ketorolac (TORADOL) 30 MG/ML injection 30 mg (has no administration in  time range)  methocarbamol (ROBAXIN) tablet 500 mg (has no administration in time range)    ED Course  I have reviewed the triage vital signs and the nursing notes.  Pertinent labs & imaging results that were available during my care of the patient were reviewed by me and considered in my medical decision making (see chart for details).    MDM Rules/Calculators/A&P                          Patient here with 2-day history of pain to the bilateral cervical paraspinal muscles.  No associated symptoms.  No focal neuro deficits on exam.  No meningeal signs.  Patient is well-appearing.  Nontoxic. Symptoms appear musculoskeletal.  No concerning symptoms for emergent process.  Patient had normal creatinine in February no history of kidney disease.  Will treat with NSAID and muscle relaxer  1935 recheck, patient feeling much better stating that her pain has greatly improved she has improved range of motion.  States that she is ready for discharge home.   Final Clinical Impression(s) / ED Diagnoses Final diagnoses:  Torticollis    Rx / DC Orders ED Discharge Orders    None       Kem Parkinson, PA-C 10/03/19 1943    Fredia Sorrow, MD 10/07/19 [id number]

## 2019-10-03 NOTE — Discharge Instructions (Addendum)
Alternate ice and heat to your neck.  Follow-up with your primary doctor for recheck.

## 2019-10-03 NOTE — ED Triage Notes (Signed)
Patient c/o neck pain without any known injury. Per patient woke Thursday morning with pain and pain is progressively getting worse. Patient took one x1 hydrocodone last night that she had remaining from dental pain a while back. Per patient small amount of relief. Per patient unable to turn head in any direction without pain. Denies any fevers or other symptoms.

## 2019-10-07 ENCOUNTER — Telehealth: Payer: Self-pay | Admitting: Internal Medicine

## 2019-10-07 NOTE — Progress Notes (Signed)
°  Chronic Care Management   Note  10/07/2019 Name: [name] MRN: [id number] DOB: 11/25/1945  [name] is a 74 y.o. year old female who is a primary care patient of Burns, Claudina Lick, MD. I reached out to [employer] by phone today in response to a referral sent by [name]'s PCP, Binnie Rail, MD.   [name] was given information about Chronic Care Management services today including:  1. CCM service includes personalized support from designated clinical staff supervised by her physician, including individualized plan of care and coordination with other care providers 2. 24/7 contact phone numbers for assistance for urgent and routine care needs. 3. Service will only be billed when office clinical staff spend 20 minutes or more in a month to coordinate care. 4. Only one practitioner may furnish and bill the service in a calendar month. 5. The patient may stop CCM services at any time (effective at the end of the month) by phone call to the office staff.   Patient agreed to services and verbal consent obtained.   Follow up plan:   Earney Hamburg [facility] Scheduler

## 2019-11-19 ENCOUNTER — Other Ambulatory Visit: Payer: Self-pay | Admitting: Cardiovascular Disease

## 2019-11-26 ENCOUNTER — Other Ambulatory Visit: Payer: Self-pay | Admitting: Internal Medicine

## 2019-11-26 DIAGNOSIS — Z1231 Encounter for screening mammogram for malignant neoplasm of breast: Secondary | ICD-10-CM

## 2019-12-17 ENCOUNTER — Other Ambulatory Visit: Payer: Self-pay | Admitting: Cardiovascular Disease

## 2019-12-19 ENCOUNTER — Other Ambulatory Visit: Payer: Self-pay | Admitting: Internal Medicine

## 2019-12-24 ENCOUNTER — Other Ambulatory Visit: Payer: Self-pay

## 2019-12-24 ENCOUNTER — Ambulatory Visit
Admission: RE | Admit: 2019-12-24 | Discharge: 2019-12-24 | Disposition: A | Discharge status: Home / Self Care | Payer: Medicare (Managed Care) | Source: Ambulatory Visit | Attending: Internal Medicine | Admitting: Internal Medicine

## 2019-12-24 DIAGNOSIS — Z1231 Encounter for screening mammogram for malignant neoplasm of breast: Secondary | ICD-10-CM

## 2019-12-29 ENCOUNTER — Telehealth: Payer: Self-pay | Admitting: Pharmacist

## 2019-12-29 NOTE — Progress Notes (Signed)
A user error has taken place: encounter opened in error, closed for administrative reasons.

## 2019-12-30 ENCOUNTER — Telehealth: Payer: Medicare Other

## 2019-12-30 NOTE — Chronic Care Management (AMB) (Deleted)
Chronic Care Management Pharmacy  Name: [name]  MRN: [id number] DOB: 08-28-1945   Chief Complaint/ HPI  Dala Dock,  74 y.o. , female presents for their Initial CCM visit with the clinical pharmacist via telephone due to COVID-19 Pandemic.  PCP : Binnie Rail, MD Patient Care Team: Binnie Rail, MD as PCP - General (Internal Medicine) Sherren Mocha, MD as PCP - Cardiology (Cardiology) Charlton Haws, [facility] as Pharmacist (Pharmacist) Charlton Haws, [facility] as Pharmacist (Pharmacist)  Their chronic conditions include: Hypertension, Hyperlipidemia, GERD, Osteopenia, Tobacco use and Prediabetes, Neuropathy, hx breast cancer   Office Visits: 08/05/19 Dr Quay Burow OV: acute visit for dental infection. Rx'd Augmentin x 10 days and pt making appt with dentist. BP uncontrolled, started Hydralazine 50 mg BID.  06/05/19 Dr Quay Burow OV: chronic f/u. Increased labetalol to 300 mg BID, goal < 140/90.  Consult Visit: 10/03/19 ED visit: neck pain, given ketorolac injection and methocarbamol and discharged with methocarbamol and naproxen.  Allergies  Allergen Reactions  . Benazepril Hcl     REACTION: FACE SWELLING (ANGIOEDEMA); she can not take ARBS  !!! Because of a history of documented adverse serious drug reaction;Medi Alert bracelet  is recommended  . Peanut-Containing Drug Products     03/13/13 hivesw/o facial swelling  . Shellfish Allergy     03/15/13 hives & facial swelling  . Yellow Dyes (Non-Tartrazine)     12/14 hives  & facial swelling with Christene Lye Aid    Medications: Outpatient Encounter Medications as of 12/30/2019  Medication Sig  . amLODipine (NORVASC) 10 MG tablet TAKE 1 TABLET BY MOUTH EVERY DAY  . amoxicillin-clavulanate (AUGMENTIN) 875-125 MG tablet Take 1 tablet by mouth 2 (two) times daily.  Marland Kitchen aspirin EC 81 MG tablet Take 1 tablet (81 mg total) by mouth daily.  Marland Kitchen atorvastatin (LIPITOR) 20 MG tablet Take 1 tablet (20 mg total) by mouth daily. Please make  overdue appt with Dr. Burt Knack before anymore refills. 3rd and Final Attempt  . gabapentin (NEURONTIN) 300 MG capsule TAKE 1 CAPSULE BY MOUTH EVERYDAY AT BEDTIME  . hydrALAZINE (APRESOLINE) 25 MG tablet Take 2 tablets (50 mg total) by mouth in the morning and at bedtime.  . hydrochlorothiazide (HYDRODIURIL) 25 MG tablet TAKE 1 TABLET BY MOUTH EVERY DAY  . HYDROcodone-acetaminophen (NORCO/VICODIN) 5-325 MG tablet Take 1-2 tablets by mouth every 8 (eight) hours as needed for severe pain.  Marland Kitchen labetalol (NORMODYNE) 300 MG tablet Take 1 tablet (300 mg total) by mouth 2 (two) times daily.  . methocarbamol (ROBAXIN) 500 MG tablet Take 1 tablet (500 mg total) by mouth 3 (three) times daily.  . naproxen (NAPROSYN) 500 MG tablet Take 1 tablet (500 mg total) by mouth 2 (two) times daily with a meal.  . omeprazole (PRILOSEC) 40 MG capsule Take 1 capsule (40 mg total) by mouth daily. Annual appt due in Nov must see provider for future refills   No facility-administered encounter medications on file as of 12/30/2019.    Wt Readings from Last 3 Encounters:  10/03/19 196 lb (88.9 kg)  08/05/19 201 lb (91.2 kg)  06/05/19 197 lb (89.4 kg)    Current Diagnosis/Assessment:    Goals Addressed   None     Hypertension   BP goal is:  <140/90  Office blood pressures are  BP Readings from Last 3 Encounters:  10/03/19 (!) 174/76  08/05/19 (!) 174/82  06/05/19 (!) 150/82   Kidney Function Lab Results  Component Value Date/Time   CREATININE  0.91 06/05/2019 11:22 AM   CREATININE 1.01 02/23/2019 03:22 PM   GFR 73.53 06/05/2019 11:22 AM   GFRNONAA 66.69 01/18/2010 12:00 AM   K 4.0 06/05/2019 11:22 AM   K 3.7 02/23/2019 03:22 PM   Patient checks BP at home {CHL HP BP Monitoring [phone]} Patient home BP readings are ranging: ***  Patient has failed these meds in the past: benazepril (angioedema), spironolactone Patient is currently {CHL Controlled/Uncontrolled:[phone]} on the following  medications:  . Amlodipine 10 mg daily . Hydralazine 25 mg - 2 tab BID . HCTZ 25 mg daily . Labetalol 300 mg BID  We discussed {CHL HP Upstream Pharmacy discussion:[phone]}  Plan  Continue {CHL HP Upstream Pharmacy [phone]}     Hyperlipidemia   LDL goal < 100  Lipid Panel     Component Value Date/Time   CHOL 169 06/05/2019 1122   CHOL 168 05/18/2016 0844   TRIG 135.0 06/05/2019 1122   HDL 44.50 06/05/2019 1122   HDL 49 05/18/2016 0844   LDLCALC 97 06/05/2019 1122   LDLCALC 100 (H) 05/18/2016 0844   LDLDIRECT 141.8 07/31/2012 1537    Hepatic Function Latest Ref Rng & Units 06/05/2019 02/23/2019 11/19/2018  Total Protein 6.0 - 8.3 g/dL 8.1 7.7 8.1  Albumin 3.5 - 5.2 g/dL 4.1 4.2 4.4  AST 0 - 37 U/L '10 11 9  ' ALT 0 - 35 U/L '7 7 7  ' Alk Phosphatase 39 - 117 U/L 74 77 86  Total Bilirubin 0.2 - 1.2 mg/dL 0.7 0.6 0.6  Bilirubin, Direct 0.00 - 0.40 mg/dL - - -     The 10-year ASCVD risk score Mikey Bussing DC Jr., et al., 2013) is: 32.9%   Values used to calculate the score:     Age: 38 years     Sex: Female     Is Non-Hispanic African American: Yes     Diabetic: No     Tobacco smoker: Yes     Systolic Blood Pressure: 600 mmHg     Is BP treated: Yes     HDL Cholesterol: 44.5 mg/dL     Total Cholesterol: 169 mg/dL   Patient has failed these meds in past: *** Patient is currently {CHL Controlled/Uncontrolled:[phone]} on the following medications:  . Atorvastatin 20 mg daily . Aspirin 81 mg daily  We discussed:  {CHL HP Upstream Pharmacy discussion:[phone]}  Plan  Continue {CHL HP Upstream Pharmacy [phone]}  GERD   Patient has failed these meds in past: *** Patient is currently {CHL Controlled/Uncontrolled:[phone]} on the following medications:  . Omeprazole 40 mg daily  We discussed:  ***  Plan  Continue {CHL HP Upstream Pharmacy [phone]}  Pain   Neuropathy Tendinitis  Patient has failed these meds in past: *** Patient  is currently {CHL Controlled/Uncontrolled:[phone]} on the following medications:  . Naproen 500 mg BID prn . Gabapentin 300 mg HS . Methocarbamol 500 mg TID  We discussed:  ***  Plan  Continue {CHL HP Upstream Pharmacy [phone]}  Osteopenia / Osteoporosis   Last DEXA Scan: 06/12/2018   T-Score femoral neck: -0.9  T-Score total hip: 0.0  T-Score lumbar spine: n/a  T-Score forearm radius: -1.4  10-year probability of major osteoporotic fracture: 3.7%  10-year probability of hip fracture: 0.4%  Vit D, 25-Hydroxy  Date Value Ref Range Status  08/23/2009 18 (L) 30 - 89 ng/mL Final    Comment:    This assay accurately quantifies Vitamin D, which is the sum of the25-Hydroxy forms of Vitamin D2 and D3.  Studies have shown that theoptimum concentration of 25-Hydroxy Vitamin D is 30 ng/mL or higher. Concentrations of Vitamin D between 20 and 29 ng/mL  are considered tobe insufficient and concentrations less than 20 ng/mL are consideredto be deficient for Vitamin D.     Patient is not a candidate for pharmacologic treatment  Patient has failed these meds in past: *** Patient is currently {CHL Controlled/Uncontrolled:[phone]} on the following medications:  . No medications  We discussed:  {Osteoporosis Counseling:23892}  Plan  Continue {CHL HP Upstream Pharmacy DGUYQ:[id number]}  Tobacco Abuse   Tobacco Status:  Social History   Tobacco Use  Smoking Status Current Some Day Smoker  . Packs/day: 0.50  . Years: 48.00  . Pack years: 24.00  . Types: Cigarettes  Smokeless Tobacco Never Used   Patient smokes {Time to first cigarette:23873} Patient triggers include: {Smoking Triggers:23882} On a scale of 1-10, reports MOTIVATION to quit is *** On a scale of 1-10, reports CONFIDENCE in quitting is ***  Previous quit attempts included: *** Patient is currently {CHL Controlled/Uncontrolled:[phone]} on the following medications:  . ***  We discussed:  {Smoking  Cessation Counseling:23883}  Plan  Continue {CHL HP Upstream Pharmacy LOVFI:[id number]}  Vaccines   Reviewed and discussed patient's vaccination history.    Immunization History  Administered Date(s) Administered  . Moderna SARS-COVID-2 Vaccination 06/20/2019, 07/18/2019    Plan  Recommended patient receive *** vaccine in *** office.   Medication Management   Pt uses [facility] for all medications Uses pill box? {Yes or If no, why not?:20788} Pt endorses ***% compliance  We discussed: {Pharmacy options:24294}  Plan  {[facility] Pharmacy ZYSA:63016}    Follow up: *** month phone visit  ***

## 2020-01-05 ENCOUNTER — Telehealth: Payer: Self-pay | Admitting: Internal Medicine

## 2020-01-05 NOTE — Progress Notes (Signed)
  Chronic Care Management   Outreach Note  01/05/2020 Name: [name] MRN: [id number] DOB: 10-09-1945  Referred by: Binnie Rail, MD Reason for referral : No chief complaint on file.   An unsuccessful telephone outreach was attempted today. The patient was referred to the pharmacist for assistance with care management and care coordination.   Follow Up Plan:   Carley Perdue [facility] Scheduler

## 2020-01-07 ENCOUNTER — Telehealth: Payer: Self-pay | Admitting: Internal Medicine

## 2020-01-07 NOTE — Progress Notes (Signed)
  Chronic Care Management   Outreach Note  01/07/2020 Name: [name] MRN: [id number] DOB: 04-May-1945  Referred by: Binnie Rail, MD Reason for referral : No chief complaint on file.   A second unsuccessful telephone outreach was attempted today. The patient was referred to pharmacist for assistance with care management and care coordination.  Follow Up Plan:   Carley Perdue [facility] Scheduler

## 2020-01-08 ENCOUNTER — Telehealth: Payer: Self-pay | Admitting: Internal Medicine

## 2020-01-08 NOTE — Progress Notes (Signed)
  Chronic Care Management   Outreach Note  01/08/2020 Name: Ange Puskas MRN: [id number] DOB: December 08, 1945  Referred by: Binnie Rail, MD Reason for referral : No chief complaint on file.   Third unsuccessful telephone outreach was attempted today. The patient was referred to the pharmacist for assistance with care management and care coordination.   Follow Up Plan:   Carley Perdue [facility] Scheduler

## 2020-01-18 ENCOUNTER — Other Ambulatory Visit: Payer: Self-pay | Admitting: Cardiovascular Disease

## 2020-02-11 ENCOUNTER — Other Ambulatory Visit: Payer: Self-pay | Admitting: Internal Medicine

## 2020-03-23 ENCOUNTER — Other Ambulatory Visit: Payer: Self-pay | Admitting: Internal Medicine

## 2020-04-19 ENCOUNTER — Other Ambulatory Visit: Payer: Self-pay | Admitting: Internal Medicine

## 2020-05-01 ENCOUNTER — Encounter (HOSPITAL_COMMUNITY): Payer: Self-pay

## 2020-05-01 ENCOUNTER — Emergency Department (HOSPITAL_COMMUNITY)
Admission: EM | Admit: 2020-05-01 | Discharge: 2020-05-01 | Disposition: A | Discharge status: Home / Self Care | Payer: Medicare (Managed Care) | Attending: Emergency Medicine | Admitting: Emergency Medicine

## 2020-05-01 ENCOUNTER — Other Ambulatory Visit: Payer: Self-pay

## 2020-05-01 DIAGNOSIS — Z7982 Long term (current) use of aspirin: Secondary | ICD-10-CM | POA: Insufficient documentation

## 2020-05-01 DIAGNOSIS — Z91010 Allergy to peanuts: Secondary | ICD-10-CM | POA: Insufficient documentation

## 2020-05-01 DIAGNOSIS — I10 Essential (primary) hypertension: Secondary | ICD-10-CM | POA: Diagnosis not present

## 2020-05-01 DIAGNOSIS — R040 Epistaxis: Secondary | ICD-10-CM

## 2020-05-01 DIAGNOSIS — F17210 Nicotine dependence, cigarettes, uncomplicated: Secondary | ICD-10-CM | POA: Insufficient documentation

## 2020-05-01 DIAGNOSIS — Z79899 Other long term (current) drug therapy: Secondary | ICD-10-CM | POA: Diagnosis not present

## 2020-05-01 DIAGNOSIS — Z853 Personal history of malignant neoplasm of breast: Secondary | ICD-10-CM | POA: Diagnosis not present

## 2020-05-01 MED ORDER — AFRIN NASAL SPRAY 0.05 % NA SOLN
1.0000 | Freq: Two times a day (BID) | NASAL | 0 refills | Status: DC
Start: 2020-05-01 — End: 2020-06-21

## 2020-05-01 NOTE — Discharge Instructions (Signed)
Remember to use vaseline twice a day like I told you, and to use a humidifer at home.

## 2020-05-01 NOTE — ED Triage Notes (Signed)
Pt to er, pt states that she has had a nose bleed off and on since Friday, states that she is not bleeding at this time.

## 2020-05-01 NOTE — ED Provider Notes (Signed)
[facility] EMERGENCY DEPARTMENT Provider Note   CSN: [id number] Arrival date & time: 05/01/20  1322     History Chief Complaint  Patient presents with  . Epistaxis    [name] is a 75 y.o. female presenting to ED with nose bleed.  Patient reports right sided nose bleed on and off for 2 days.  Began when she woke up Friday morning.  The bleed was able to stop by itself.  Friday evening again she spontaneous bleeding, was able to stop this with some pressure.  Yesterday morning she had some more bleeding, called her doctor's office, was instructed how to apply firm pressure to her nose.  She did so and the bleeding stopped.  She now presents to the ED for evaluation.  She has had no active bleeding.  She is not on blood thinners.  She has no prior history of nosebleeds.  She reports that she does not use a humidifier in the house, and that her home is generally dry.  She is concerned because she reports she had a brother-in-law who had nosebleeds and was later found to have nasal cancer.  HPI     Past Medical History:  Diagnosis Date  . Absence of menstruation    amenorrhea  . Angioneurotic edema not elsewhere classified    due to ACE-I  . Breast disorder    breast cancer 2007  . Cancer [facility]) 2007   Left Breast Cancer  . Carotid stenosis    Carotid US (8/15):  RICA 123456; LICA 123456 >>> FU 1 year  . H/O: rheumatic fever   . History of breast cancer 07/13/2015  . HLD (hyperlipidemia)   . Occlusion and stenosis of carotid artery without mention of cerebral infarction   . Other abnormal glucose    fasting hyperglycemia  . Personal history of malignant neoplasm of breast   . Personal history of radiation therapy 2007  . Unspecified essential hypertension     Patient Active Problem List   Diagnosis Date Noted  . Dental infection 08/05/2019  . Clavicle enlargement 07/23/2018  . Osteopenia 06/14/2018  . Achilles tendinitis 01/29/2017  . Bilateral carotid artery occlusion  01/29/2017  . Neuropathy 01/28/2017  . Prediabetes 07/24/2015  . GERD (gastroesophageal reflux disease) 07/20/2015  . History of breast cancer 07/13/2015  . Vitiligo 07/07/2012  . PLANTAR FASCIITIS 06/16/2010  . Tobacco dependence 06/14/2009  . Hyperlipidemia 11/10/2008  . ANGIONEUROTIC EDEMA 01/09/2008  . Essential hypertension 06/12/2007  . Personal history of malignant neoplasm of breast 02/25/2007  . RHEUMATIC FEVER, HX OF 02/21/2007    Past Surgical History:  Procedure Laterality Date  . APPENDECTOMY    . BREAST BIOPSY Left 12/06/2005   malignant  . BREAST LUMPECTOMY Left 01/08/2006  . BREAST LUMPECTOMY Left 01/08/2006  . COLONOSCOPY  2005   negative; Dr Collene Mares  . COLONOSCOPY N/A 09/02/2015   Procedure: COLONOSCOPY;  Surgeon: Danie Binder, MD;  Location: [facility];  Service: Endoscopy;  Laterality: N/A;  1215-moved to 1130 Ginger to notify pt  . G0 P0     Dr. Maudry Diego     OB History    Gravida  0   Para  0   Term  0   Preterm  0   AB  0   Living  0     SAB  0   IAB  0   Ectopic  0   Multiple  0   Live Births  Family History  Problem Relation Age of Onset  . Breast cancer Sister   . Hypertension Other        some of Pts brothers and sisters  . Heart attack Neg Hx   . Stroke Neg Hx   . Diabetes Neg Hx   . Heart disease Neg Hx     Social History   Tobacco Use  . Smoking status: Current Some Day Smoker    Packs/day: 0.50    Years: 48.00    Pack years: 24.00    Types: Cigarettes  . Smokeless tobacco: Never Used  Vaping Use  . Vaping Use: Never used  Substance Use Topics  . Alcohol use: Yes    Comment: occ  . Drug use: No    Home Medications Prior to Admission medications   Medication Sig Start Date End Date Taking? Authorizing Provider  oxymetazoline (AFRIN NASAL SPRAY) 0.05 % nasal spray Place 1 spray into both nostrils 2 (two) times daily. Do not use for more than 3 days in a row 05/01/20  Yes Tanuj Mullens, Carola Rhine,  MD  amLODipine (NORVASC) 10 MG tablet TAKE 1 TABLET BY MOUTH EVERY DAY 03/23/20   Binnie Rail, MD  amoxicillin-clavulanate (AUGMENTIN) 875-125 MG tablet Take 1 tablet by mouth 2 (two) times daily. 08/05/19   Binnie Rail, MD  aspirin EC 81 MG tablet Take 1 tablet (81 mg total) by mouth daily. 05/23/17   Sherren Mocha, MD  atorvastatin (LIPITOR) 20 MG tablet Take 1 tablet (20 mg total) by mouth daily. Please make overdue appt with Dr. Burt Knack before anymore refills. 3rd and Final Attempt 12/18/19   Sherren Mocha, MD  gabapentin (NEURONTIN) 300 MG capsule TAKE 1 CAPSULE BY MOUTH EVERYDAY AT BEDTIME 04/30/19   Binnie Rail, MD  hydrALAZINE (APRESOLINE) 25 MG tablet TAKE 2 TABLETS BY MOUTH IN THE MORNING AND AT BEDTIME. 02/11/20   Burns, Claudina Lick, MD  hydrochlorothiazide (HYDRODIURIL) 25 MG tablet TAKE 1 TABLET BY MOUTH EVERY DAY 04/19/20   Binnie Rail, MD  HYDROcodone-acetaminophen (NORCO/VICODIN) 5-325 MG tablet Take 1-2 tablets by mouth every 8 (eight) hours as needed for severe pain. 08/05/19   Binnie Rail, MD  labetalol (NORMODYNE) 300 MG tablet Take 1 tablet (300 mg total) by mouth 2 (two) times daily. 02/23/19   Binnie Rail, MD  methocarbamol (ROBAXIN) 500 MG tablet Take 1 tablet (500 mg total) by mouth 3 (three) times daily. 10/03/19   Triplett, Tammy, PA-C  naproxen (NAPROSYN) 500 MG tablet Take 1 tablet (500 mg total) by mouth 2 (two) times daily with a meal. 10/03/19   Triplett, Tammy, PA-C  omeprazole (PRILOSEC) 40 MG capsule Take 1 capsule (40 mg total) by mouth daily. Annual appt due in Nov must see provider for future refills 12/21/19   Binnie Rail, MD    Allergies    Benazepril hcl, Peanut-containing drug products, Shellfish allergy, and Yellow dyes (non-tartrazine)  Review of Systems   Review of Systems  Constitutional: Negative for chills and fever.  HENT: Positive for nosebleeds. Negative for sore throat.   Respiratory: Negative for cough and shortness of breath.    Cardiovascular: Negative for chest pain and palpitations.  Gastrointestinal: Negative for abdominal pain and vomiting.  Musculoskeletal: Negative for arthralgias and back pain.  Skin: Negative for color change and rash.  Neurological: Negative for syncope and light-headedness.  All other systems reviewed and are negative.   Physical Exam Updated Vital Signs BP (!) 178/91  Pulse 62   Temp 98.5 F (36.9 C) (Oral)   Resp 16   Ht 5\' 4"  (1.626 m)   Wt 86.2 kg   SpO2 96%   BMI 32.61 kg/m   Physical Exam Constitutional:      General: She is not in acute distress. HENT:     Head: Normocephalic and atraumatic.     Comments: No visible bleed or source in bilateral nares     Nose: Nose normal.  Eyes:     Conjunctiva/sclera: Conjunctivae normal.     Pupils: Pupils are equal, round, and reactive to light.  Cardiovascular:     Rate and Rhythm: Normal rate and regular rhythm.  Pulmonary:     Effort: Pulmonary effort is normal. No respiratory distress.  Skin:    General: Skin is warm and dry.  Neurological:     General: No focal deficit present.     Mental Status: She is alert. Mental status is at baseline.  Psychiatric:        Mood and Affect: Mood normal.        Behavior: Behavior normal.     ED Results / Procedures / Treatments   Labs (all labs ordered are listed, but only abnormal results are displayed) Labs Reviewed - No data to display  EKG None  Radiology No results found.  Procedures Procedures (including critical care time)  Medications Ordered in ED Medications - No data to display  ED Course  I have reviewed the triage vital signs and the nursing notes.  Pertinent labs & imaging results that were available during my care of the patient were reviewed by me and considered in my medical decision making (see chart for details).  75 year old female here with right-sided nosebleed, intermittently for the past 2 days.  She has no active bleed on exam.  No  signs of active anemia.  I have low suspicion for new onset of clotting disorder or bleeding disorder.  I suspect rather this is a result of the recent dry and cold weather.  We discussed using Vaseline and humidifier at home.  Also discussed adding Afrin to her regimen if she continues to bleed.  I again reiterated how to control nosebleeds at home.  Advised that she follow-up with her PCP.  I reported to her that I have a fairly low suspicion that this is related to nasal cancer, which is overall very rare.  However if she continues to have specific concerns, or continues to have bleeds despite the measures I discussed, I provided her an ENT office number for follow-up.      Final Clinical Impression(s) / ED Diagnoses Final diagnoses:  Epistaxis    Rx / DC Orders ED Discharge Orders         Ordered    oxymetazoline (AFRIN NASAL SPRAY) 0.05 % nasal spray  2 times daily        05/01/20 1601           Wyvonnia Dusky, MD 05/01/20 1733

## 2020-05-02 ENCOUNTER — Telehealth: Payer: Self-pay | Admitting: Internal Medicine

## 2020-05-02 NOTE — Progress Notes (Signed)
Subjective:    Patient ID: [name], female    DOB: 1946-04-03, 75 y.o.   MRN: [id number]  HPI The patient is here for an acute visit.   Nose bleeds:  Went to ED 2 days ago for this.  Had been going on intermittently x 2 days. Bleeding stopped with pressure.  She has no h/o bleeding.  Her house is dry.  She was concerned about nasal cancer, which is what her brother-in-law had.  In the ED there was no bleeding or source of bleeding seen in the nostrils.  Bleeding thought to be related to dryness. Advised saline nasal spray, vaseline at night. She was prescribed Afrin to use as needed and given ENT's number if needed   She had bleeding this morning. It is on the right side.  It stopped after 5-10 min after pressure.  She did use the Afrin once-she did not use it when the nose was bleeding.  She has not been using any saline nasal spray.  She use the Vaseline last night.    When the bleeding started she had a kink in her neck and she tylenol. She also took two aleve, but only once.  She still has it in the back of her head about the possibility of nasal cancer.  Medications and allergies reviewed with patient and updated if appropriate.  Patient Active Problem List   Diagnosis Date Noted  . Dental infection 08/05/2019  . Clavicle enlargement 07/23/2018  . Osteopenia 06/14/2018  . Achilles tendinitis 01/29/2017  . Bilateral carotid artery occlusion 01/29/2017  . Neuropathy 01/28/2017  . Prediabetes 07/24/2015  . GERD (gastroesophageal reflux disease) 07/20/2015  . History of breast cancer 07/13/2015  . Vitiligo 07/07/2012  . PLANTAR FASCIITIS 06/16/2010  . Tobacco dependence 06/14/2009  . Hyperlipidemia 11/10/2008  . ANGIONEUROTIC EDEMA 01/09/2008  . Essential hypertension 06/12/2007  . Personal history of malignant neoplasm of breast 02/25/2007  . RHEUMATIC FEVER, HX OF 02/21/2007    Current Outpatient Medications on File Prior to Visit  Medication Sig Dispense Refill   . amLODipine (NORVASC) 10 MG tablet TAKE 1 TABLET BY MOUTH EVERY DAY 90 tablet 1  . aspirin EC 81 MG tablet Take 1 tablet (81 mg total) by mouth daily. 90 tablet 3  . atorvastatin (LIPITOR) 20 MG tablet Take 1 tablet (20 mg total) by mouth daily. Please make overdue appt with Dr. Burt Knack before anymore refills. 3rd and Final Attempt 15 tablet 0  . gabapentin (NEURONTIN) 300 MG capsule TAKE 1 CAPSULE BY MOUTH EVERYDAY AT BEDTIME 90 capsule 1  . hydrochlorothiazide (HYDRODIURIL) 25 MG tablet TAKE 1 TABLET BY MOUTH EVERY DAY 90 tablet 1  . HYDROcodone-acetaminophen (NORCO/VICODIN) 5-325 MG tablet Take 1-2 tablets by mouth every 8 (eight) hours as needed for severe pain. 12 tablet 0  . labetalol (NORMODYNE) 300 MG tablet Take 1 tablet (300 mg total) by mouth 2 (two) times daily. 180 tablet 1  . methocarbamol (ROBAXIN) 500 MG tablet Take 1 tablet (500 mg total) by mouth 3 (three) times daily. 15 tablet 0  . omeprazole (PRILOSEC) 40 MG capsule Take 1 capsule (40 mg total) by mouth daily. Annual appt due in Nov must see provider for future refills 90 capsule 0  . oxymetazoline (AFRIN NASAL SPRAY) 0.05 % nasal spray Place 1 spray into both nostrils 2 (two) times daily. Do not use for more than 3 days in a row 30 mL 0   No current facility-administered medications on file prior to  visit.    Past Medical History:  Diagnosis Date  . Absence of menstruation    amenorrhea  . Angioneurotic edema not elsewhere classified    due to ACE-I  . Breast disorder    breast cancer 2007  . Cancer [facility]) 2007   Left Breast Cancer  . Carotid stenosis    Carotid US (8/15):  RICA 56-43%; LICA 32-95% >>> FU 1 year  . H/O: rheumatic fever   . History of breast cancer 07/13/2015  . HLD (hyperlipidemia)   . Occlusion and stenosis of carotid artery without mention of cerebral infarction   . Other abnormal glucose    fasting hyperglycemia  . Personal history of malignant neoplasm of breast   . Personal history of  radiation therapy 2007  . Unspecified essential hypertension     Past Surgical History:  Procedure Laterality Date  . APPENDECTOMY    . BREAST BIOPSY Left 12/06/2005   malignant  . BREAST LUMPECTOMY Left 01/08/2006  . BREAST LUMPECTOMY Left 01/08/2006  . COLONOSCOPY  2005   negative; Dr Collene Mares  . COLONOSCOPY N/A 09/02/2015   Procedure: COLONOSCOPY;  Surgeon: Danie Binder, MD;  Location: [facility];  Service: Endoscopy;  Laterality: N/A;  1215-moved to 1130 Ginger to notify pt  . G0 P0     Dr. Maudry Diego    Social History   Socioeconomic History  . Marital status: Single    Spouse name: Not on file  . Number of children: Not on file  . Years of education: Not on file  . Highest education level: Not on file  Occupational History  . Not on file  Tobacco Use  . Smoking status: Current Some Day Smoker    Packs/day: 0.50    Years: 48.00    Pack years: 24.00    Types: Cigarettes  . Smokeless tobacco: Never Used  Vaping Use  . Vaping Use: Never used  Substance and Sexual Activity  . Alcohol use: Yes    Comment: occ  . Drug use: No  . Sexual activity: Yes    Birth control/protection: Post-menopausal  Other Topics Concern  . Not on file  Social History Narrative   Single, no children.   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    Social Determinants of Health   Financial Resource Strain: Not on file  Food Insecurity: Not on file  Transportation Needs: Not on file  Physical Activity: Not on file  Stress: Not on file  Social Connections: Not on file    Family History  Problem Relation Age of Onset  . Breast cancer Sister   . Hypertension Other        some of Pts brothers and sisters  . Heart attack Neg Hx   . Stroke Neg Hx   . Diabetes Neg Hx   . Heart disease Neg Hx     Review of Systems  Constitutional: Negative for fever.  HENT: Positive for nosebleeds. Negative for congestion, sinus pain and sore throat.   Respiratory: Negative for shortness of  breath.   Cardiovascular: Negative for chest pain, palpitations and leg swelling.  Neurological: Negative for dizziness and headaches.       Objective:   Vitals:   05/03/20 1055  BP: (!) 156/70  Pulse: (!) 59  Temp: 98.7 F (37.1 C)  SpO2: 98%   BP Readings from Last 3 Encounters:  05/03/20 (!) 156/70  05/01/20 (!) 178/91  10/03/19 (!) 174/76   Wt Readings from Last  3 Encounters:  05/03/20 183 lb (83 kg)  05/01/20 190 lb (86.2 kg)  10/03/19 196 lb (88.9 kg)   Body mass index is 31.41 kg/m.   Physical Exam Constitutional:      General: She is not in acute distress.    Appearance: Normal appearance. She is not ill-appearing.  HENT:     Head: Normocephalic and atraumatic.     Nose: No congestion or rhinorrhea.     Comments: Right nostril there is some fresh blood on the outer mid aspect of her nostril.  No active bleeding-likely residual from this morning Skin:    General: Skin is warm and dry.  Neurological:     Mental Status: She is alert.            Assessment & Plan:    See Problem List for Assessment and Plan of chronic medical problems.    This visit occurred during the SARS-CoV-2 public health emergency.  Safety protocols were in place, including screening questions prior to the visit, additional usage of staff PPE, and extensive cleaning of exam room while observing appropriate contact time as indicated for disinfecting solutions.

## 2020-05-02 NOTE — Telephone Encounter (Signed)
[facility] FYI  Pt is having nose bleeds off and on today.   [facility] advised: Home Care  Patient understood but decided to go to ED on 1.23.2022.

## 2020-05-03 ENCOUNTER — Ambulatory Visit: Payer: Medicare (Managed Care) | Admitting: Internal Medicine

## 2020-05-03 ENCOUNTER — Encounter: Payer: Self-pay | Admitting: Internal Medicine

## 2020-05-03 ENCOUNTER — Other Ambulatory Visit: Payer: Self-pay

## 2020-05-03 DIAGNOSIS — I10 Essential (primary) hypertension: Secondary | ICD-10-CM | POA: Diagnosis not present

## 2020-05-03 DIAGNOSIS — R040 Epistaxis: Secondary | ICD-10-CM | POA: Diagnosis not present

## 2020-05-03 MED ORDER — HYDRALAZINE HCL 25 MG PO TABS: ORAL_TABLET | ORAL | 5 refills | Status: DC

## 2020-05-03 NOTE — Assessment & Plan Note (Signed)
Chronic Blood pressure not ideally controlled She does have her sister monitor her blood pressure and she states most of the time it is okay, but she does not recall any of the numbers.  Advised her to continue to monitor this at home and keep a log so that we can follow-up at her next visit Discussed that we may need to increase her medication to better control her blood pressure For now continue amlodipine 10 mg daily, hydralazine 50 mg twice daily, hydrochlorothiazide 25 mg daily and labetalol 300 mg twice daily

## 2020-05-03 NOTE — Patient Instructions (Addendum)
Start using saline nasal spray multiple times a day.    Use vaseline at night in the right nostril.   If you have bleeding apply pressure and the bleeding with stop.  If it does not use the Afrin spray.   If the bleeding persists I can refer you to an ENT for further treatment.    Monitor your BP at home and record your measures.   Nosebleed, Adult A nosebleed is when blood comes out of the nose. Nosebleeds are common. Usually, they are not a sign of a serious condition. Nosebleeds can happen if a blood vessel in your nose starts to bleed or if the lining of your nose (mucous membrane) cracks. They are commonly caused by:  Allergies.  Colds.  Picking your nose.  Blowing your nose too hard.  An injury from sticking an object into your nose or getting hit in the nose.  Dry or cold air. Less common causes of nosebleeds include:  Toxic fumes.  Something abnormal in the nose or in the air-filled spaces in the bones of the face (sinuses).  Growths in the nose, such as polyps.  Blood thinners or conditions that cause blood to clot slowly.  Certain illnesses or procedures that irritate or dry out the nasal passages. Follow these instructions at home: When you have a nosebleed:  Sit down and tilt your head slightly forward.  Use a clean towel or tissue to pinch your nostrils under the bony part of your nose. After 5 minutes, let go of your nose and see if bleeding starts again. Do not release pressure before that time. If there is still bleeding, repeat the pinching and holding for 5 minutes or until the bleeding stops.  Do not place tissues or gauze in the nose to stop the bleeding.  Avoid lying down and avoid tilting your head backward. That may make blood collect in the throat and cause gagging or coughing.  Use a nasal spray decongestant to help with a nosebleed as told by your health care provider.   After a nosebleed:  Avoid blowing your nose or sniffing for a  number of hours.  Avoid straining, lifting, or bending at the waist for several days. You may go back to other normal activities as you are able.  If you are taking aspirin or blood thinners and you have nosebleeds, talk to your health care provider. These medicines make bleeding more likely. ? Ask your health care provider if you should stop taking the medicines or if you should adjust the dose. ? Do not stop taking medicines that your health care provider has recommended unless he or she tells you to stop taking them.  If your nosebleed was caused by dry mucous membranes, use over-the-counter saline nasal spray or gel and a humidifier as told by your health care provider. This will keep the mucous membranes moist and allow them to heal. If you need to use one of these products: ? Choose one that is water-soluble. ? Use only as much as you need and use it only as often as needed. ? Do not lie down right after you use it.  If you get nosebleeds often, talk with your health care provider about medical treatments. Options may include: ? Nasal cautery. This treatment stops and prevents nosebleeds by using a chemical swab or electrical device to lightly burn tiny blood vessels inside the nose. ? Nasal packing. A gauze or other material is placed in the nose to keep constant  pressure on the bleeding area. Contact a health care provider if you:  Have a fever.  Get nosebleeds often or more often than usual.  Bruise very easily.  Have a nosebleed from having something stuck in your nose.  Have bleeding in your mouth.  Vomit or cough up brown material.  Have a nosebleed after you start a new medicine. Get help right away if:  You have a nosebleed after a fall or a head injury.  Your nosebleed does not go away after 20 minutes.  You feel dizzy or weak.  You have unusual bleeding from other parts of your body.  You have unusual bruising on other parts of your body.  You become  sweaty.  You vomit blood. Summary  A nosebleed is when blood comes out of the nose. Common causes include allergies, an injury to the nose, or cold or dry air.  Initial treatment includes applying pressure for 5 minutes.  Moisturizing the nose with saline nasal spray or gel after a nosebleed may help prevent future bleeding.  Get help right away if your nosebleed does not go away after 20 minutes. This information is not intended to replace advice given to you by your health care provider. Make sure you discuss any questions you have with your health care provider. Document Revised: 01/22/2019 Document Reviewed: 01/22/2019 Elsevier Patient Education  2021 [employer].

## 2020-05-03 NOTE — Assessment & Plan Note (Addendum)
Acute Started a few days ago Discussed that the most likely cause is dryness.  Having cancer being the cause is very unlikely Advised trying to use a humidifier in her bedroom at night Use saline nasal spray multiple times a day Vaseline into the right nostril at night Advise she can use the Afrin as needed She has the name and number of an ENT if needed.  Discussed the need for possible cauterization if there is no improvement Reassured that this can typically be treated with preventative measures listed above

## 2020-05-04 ENCOUNTER — Other Ambulatory Visit: Payer: Self-pay | Admitting: Internal Medicine

## 2020-06-09 ENCOUNTER — Telehealth: Payer: Self-pay | Admitting: Cardiovascular Disease

## 2020-06-09 NOTE — Telephone Encounter (Signed)
   Pt said she missed a call from [place] with a voice mail that we have an appt available with Dr. Burt Knack for her, advised no notes on file and right now Dr. Burt Knack is still booked. She said, [name] might called her and she doesn't want to miss the opportunity getting an appt, requested to send a message to nurse

## 2020-06-09 NOTE — Telephone Encounter (Signed)
Attempted to call patient. Mailbox is full- unable to leave VM.  Will try again later.

## 2020-06-16 NOTE — Telephone Encounter (Signed)
Scheduled the patient 3/15 with Dr. Burt Knack.  She was grateful for call and agrees with plan.

## 2020-06-21 ENCOUNTER — Ambulatory Visit (INDEPENDENT_AMBULATORY_CARE_PROVIDER_SITE_OTHER): Payer: Medicare (Managed Care) | Admitting: Cardiovascular Disease

## 2020-06-21 ENCOUNTER — Encounter: Payer: Self-pay | Admitting: Cardiovascular Disease

## 2020-06-21 ENCOUNTER — Other Ambulatory Visit: Payer: Self-pay

## 2020-06-21 VITALS — BP 160/78 | HR 65 | Ht 64.0 in | Wt 185.0 lb

## 2020-06-21 DIAGNOSIS — E782 Mixed hyperlipidemia: Secondary | ICD-10-CM | POA: Diagnosis not present

## 2020-06-21 DIAGNOSIS — Z720 Tobacco use: Secondary | ICD-10-CM

## 2020-06-21 DIAGNOSIS — I6523 Occlusion and stenosis of bilateral carotid arteries: Secondary | ICD-10-CM

## 2020-06-21 DIAGNOSIS — I10 Essential (primary) hypertension: Secondary | ICD-10-CM | POA: Diagnosis not present

## 2020-06-21 MED ORDER — HYDRALAZINE HCL 50 MG PO TABS: 50.0000 mg | ORAL_TABLET | Freq: Three times a day (TID) | ORAL | 3 refills | Status: DC

## 2020-06-21 MED ORDER — ATORVASTATIN CALCIUM 20 MG PO TABS: 20.0000 mg | ORAL_TABLET | Freq: Every day | ORAL | 3 refills | Status: DC

## 2020-06-21 NOTE — Progress Notes (Signed)
Cardiology Office Note:    Date:  06/21/2020   ID:  [name], DOB 02/17/1946, MRN [id number]  PCP:  Binnie Rail, MD   [place]  Cardiologist:  Sherren Mocha, MD  Advanced Practice Provider:  No care team member to display Electrophysiologist:  None       Referring MD: Binnie Rail, MD   Chief Complaint  Patient presents with  . Hypertension    History of Present Illness:    [name] is a 75 y.o. female with a hx of HTN, mixed hyperlipidemia, and mild bilateral carotid atherosclerosis. She is a longtime smoker, still smoking.   The patient is here alone today.  She continues to work at the [employer].  She is doing fairly well from a symptomatic perspective.  She really has no specific complaints.  She has noted that her blood pressure has been elevated.  No chest pain, chest pressure, orthopnea, PND, or heart palpitations.  She has mild exertional dyspnea unchanged over a long time.  Past Medical History:  Diagnosis Date  . Absence of menstruation    amenorrhea  . Angioneurotic edema not elsewhere classified    due to ACE-I  . Breast disorder    breast cancer 2007  . Cancer [facility]) 2007   Left Breast Cancer  . Carotid stenosis    Carotid US (8/15):  RICA 33-00%; LICA 76-22% >>> FU 1 year  . H/O: rheumatic fever   . History of breast cancer 07/13/2015  . HLD (hyperlipidemia)   . Occlusion and stenosis of carotid artery without mention of cerebral infarction   . Other abnormal glucose    fasting hyperglycemia  . Personal history of malignant neoplasm of breast   . Personal history of radiation therapy 2007  . Unspecified essential hypertension     Past Surgical History:  Procedure Laterality Date  . APPENDECTOMY    . BREAST BIOPSY Left 12/06/2005   malignant  . BREAST LUMPECTOMY Left 01/08/2006  . BREAST LUMPECTOMY Left 01/08/2006  . COLONOSCOPY  2005   negative; Dr Collene Mares  . COLONOSCOPY N/A 09/02/2015   Procedure:  COLONOSCOPY;  Surgeon: Danie Binder, MD;  Location: [facility];  Service: Endoscopy;  Laterality: N/A;  1215-moved to 1130 Ginger to notify pt  . G0 P0     Dr. Maudry Diego    Current Medications: Current Meds  Medication Sig  . amLODipine (NORVASC) 10 MG tablet TAKE 1 TABLET BY MOUTH EVERY DAY  . aspirin EC 81 MG tablet Take 1 tablet (81 mg total) by mouth daily.  Marland Kitchen atorvastatin (LIPITOR) 20 MG tablet Take 1 tablet (20 mg total) by mouth daily. Please make overdue appt with Dr. Burt Knack before anymore refills. 3rd and Final Attempt  . gabapentin (NEURONTIN) 300 MG capsule TAKE 1 CAPSULE BY MOUTH EVERYDAY AT BEDTIME  . hydrALAZINE (APRESOLINE) 25 MG tablet TAKE 2 TABLETS BY MOUTH IN THE MORNING AND AT BEDTIME.  . hydrochlorothiazide (HYDRODIURIL) 25 MG tablet TAKE 1 TABLET BY MOUTH EVERY DAY  . labetalol (NORMODYNE) 300 MG tablet Take 1 tablet (300 mg total) by mouth 2 (two) times daily.  Marland Kitchen omeprazole (PRILOSEC) 40 MG capsule TAKE 1 CAPSULE EVERY DAY  . [DISCONTINUED] HYDROcodone-acetaminophen (NORCO/VICODIN) 5-325 MG tablet Take 1-2 tablets by mouth every 8 (eight) hours as needed for severe pain.  . [DISCONTINUED] methocarbamol (ROBAXIN) 500 MG tablet Take 1 tablet (500 mg total) by mouth 3 (three) times daily.  . [DISCONTINUED] oxymetazoline (AFRIN NASAL  SPRAY) 0.05 % nasal spray Place 1 spray into both nostrils 2 (two) times daily. Do not use for more than 3 days in a row     Allergies:   Benazepril hcl, Peanut-containing drug products, Shellfish allergy, and Yellow dyes (non-tartrazine)   Social History   Socioeconomic History  . Marital status: Single    Spouse name: Not on file  . Number of children: Not on file  . Years of education: Not on file  . Highest education level: Not on file  Occupational History  . Not on file  Tobacco Use  . Smoking status: Current Some Day Smoker    Packs/day: 0.50    Years: 48.00    Pack years: 24.00    Types: Cigarettes  . Smokeless tobacco:  Never Used  Vaping Use  . Vaping Use: Never used  Substance and Sexual Activity  . Alcohol use: Yes    Comment: occ  . Drug use: No  . Sexual activity: Yes    Birth control/protection: Post-menopausal  Other Topics Concern  . Not on file  Social History Narrative   Single, no children.   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    Social Determinants of Health   Financial Resource Strain: Not on file  Food Insecurity: Not on file  Transportation Needs: Not on file  Physical Activity: Not on file  Stress: Not on file  Social Connections: Not on file     Family History: The patient's family history includes Breast cancer in her sister; Hypertension in an other family member. There is no history of Heart attack, Stroke, Diabetes, or Heart disease.  ROS:   Please see the history of present illness.    All other systems reviewed and are negative.  EKGs/Labs/Other Studies Reviewed:    EKG:  EKG is ordered today.  The ekg ordered today demonstrates NSR with sinus arrhythmia, HR 65 bpm, within normal limits  Recent Labs: No results found for requested labs within last 8760 hours.  Recent Lipid Panel    Component Value Date/Time   CHOL 169 06/05/2019 1122   CHOL 168 05/18/2016 0844   TRIG 135.0 06/05/2019 1122   HDL 44.50 06/05/2019 1122   HDL 49 05/18/2016 0844   CHOLHDL 4 06/05/2019 1122   VLDL 27.0 06/05/2019 1122   LDLCALC 97 06/05/2019 1122   LDLCALC 100 (H) 05/18/2016 0844   LDLDIRECT 141.8 07/31/2012 1537     Risk Assessment/Calculations:       Physical Exam:    VS:  BP (!) 160/78   Pulse 65   Ht 5\' 4"  (1.626 m)   Wt 185 lb (83.9 kg)   SpO2 97%   BMI 31.76 kg/m     Wt Readings from Last 3 Encounters:  06/21/20 185 lb (83.9 kg)  05/03/20 183 lb (83 kg)  05/01/20 190 lb (86.2 kg)     GEN:  Well nourished, well developed in no acute distress HEENT: Normal NECK: No JVD; No carotid bruits LYMPHATICS: No lymphadenopathy CARDIAC: RRR, no  murmurs, rubs, gallops RESPIRATORY:  Clear to auscultation without rales, wheezing or rhonchi  ABDOMEN: Soft, non-tender, non-distended MUSCULOSKELETAL:  No edema; No deformity  SKIN: Warm and dry NEUROLOGIC:  Alert and oriented x 3 PSYCHIATRIC:  Normal affect   ASSESSMENT:    1. Bilateral carotid artery stenosis   2. Essential hypertension   3. Mixed hyperlipidemia   4. Tobacco abuse    PLAN:    In order of problems listed  above:  1. Last study from 2019 reviewed with less than 40% stenosis present.  Recommend updated carotid duplex in this patient with ongoing tobacco use.  Continue medical therapy with antiplatelet treatment, high intensity statin, and antihypertensive medication. 2. Suboptimal control noted.  Currently on 10 mg of amlodipine, 50 mg of hydralazine twice daily, 300 mg of labetalol twice daily, and hydrochlorothiazide 25 mg daily.  Unable to take ACE/ARB because of angioedema.  Recommended increase hydralazine to 3 times daily.  She thinks she will have no trouble taking the medication 3 times per day.  She uses a pill Systems analyst. 3. Annual Medicare visit scheduled for tomorrow.  Suspect she will have labs drawn by her primary physician.  It appears she has been off of atorvastatin.  We will ask her to resume this at the previous dose of 20 mg daily. 4. Cessation counseling done.  The patient is not ready to quit.   Medication Adjustments/Labs and Tests Ordered: Current medicines are reviewed at length with the patient today.  Concerns regarding medicines are outlined above.  No orders of the defined types were placed in this encounter.  No orders of the defined types were placed in this encounter.   There are no Patient Instructions on file for this visit.   Signed, Sherren Mocha, MD  06/21/2020 2:11 PM    [facility]

## 2020-06-21 NOTE — Patient Instructions (Signed)
Medication Instructions:  1) INCREASE HYDRALAZINE to 50 mg three times daily 2) RESTART ATORVASTATIN 20 mg daily *If you need a refill on your cardiac medications before your next appointment, please call your pharmacy*  Testing/Procedures: Your physician has requested that you have a carotid duplex. This test is an ultrasound of the carotid arteries in your neck. It looks at blood flow through these arteries that supply the brain with blood. Allow one hour for this exam. There are no restrictions or special instructions.  Follow-Up: At [facility], you and your health needs are our priority.  As part of our continuing mission to provide you with exceptional heart care, we have created designated Provider Care Teams.  These Care Teams include your primary Cardiologist (physician) and Advanced Practice Providers (APPs -  Physician Assistants and Nurse Practitioners) who all work together to provide you with the care you need, when you need it. Your next appointment:   12 month(s) The format for your next appointment:   In Person Provider:   You may see Sherren Mocha, MD or one of the following Advanced Practice Providers on your designated Care Team:    Richardson Dopp, PA-C  Vin [place], [place]

## 2020-06-22 ENCOUNTER — Encounter: Payer: Self-pay | Admitting: Cardiovascular Disease

## 2020-06-22 ENCOUNTER — Ambulatory Visit (INDEPENDENT_AMBULATORY_CARE_PROVIDER_SITE_OTHER): Payer: Medicare (Managed Care)

## 2020-06-22 VITALS — BP 160/70 | HR 63 | Temp 98.3°F | Ht 64.0 in | Wt 185.0 lb

## 2020-06-22 DIAGNOSIS — Z0000 Encounter for general adult medical examination without abnormal findings: Secondary | ICD-10-CM

## 2020-06-22 NOTE — Patient Instructions (Signed)
[name] , Thank you for taking time to come for your Medicare Wellness Visit. I appreciate your ongoing commitment to your health goals. Please review the following plan we discussed and let me know if I can assist you in the future.   Screening recommendations/referrals: Colonoscopy: 09/02/2015; due every 10 years (08/2025) Mammogram: 12/24/2019; due every 1-2 years (12/2021) Bone Density: 06/12/2018; due every 5 years (06/2023) Recommended yearly ophthalmology/optometry visit for glaucoma screening and checkup Recommended yearly dental visit for hygiene and checkup  Vaccinations: Influenza vaccine: declined Pneumococcal vaccine: declined Tdap vaccine: declined Shingles vaccine: declined   Covid-19: 06/20/2019, 07/18/2019, 03/02/2020  Advanced directives: Advance directive discussed with you today. Even though you declined this today please call our office should you change your mind and we can give you the proper paperwork for you to fill out.  Conditions/risks identified: Yes; Reviewed health maintenance screenings with patient today and relevant education, vaccines, and/or referrals were provided. Please continue to do your personal lifestyle choices by: daily care of teeth and gums, regular physical activity (goal should be 5 days a week for 30 minutes), eat a healthy diet, avoid tobacco and drug use, limiting any alcohol intake, taking a low-dose aspirin (if not allergic or have been advised by your provider otherwise) and taking vitamins and minerals as recommended by your provider. Continue doing brain stimulating activities (puzzles, reading, adult coloring books, staying active) to keep memory sharp. Continue to eat heart healthy diet (full of fruits, vegetables, whole grains, lean protein, water--limit salt, fat, and sugar intake) and increase physical activity as tolerated.  Next appointment: Please schedule your next Medicare Wellness Visit with your Nurse Health Advisor in 1 year by  calling [phone].  Preventive Care 30 Years and Older, Female Preventive care refers to lifestyle choices and visits with your health care provider that can promote health and wellness. What does preventive care include?  A yearly physical exam. This is also called an annual well check.  Dental exams once or twice a year.  Routine eye exams. Ask your health care provider how often you should have your eyes checked.  Personal lifestyle choices, including:  Daily care of your teeth and gums.  Regular physical activity.  Eating a healthy diet.  Avoiding tobacco and drug use.  Limiting alcohol use.  Practicing safe sex.  Taking low-dose aspirin every day.  Taking vitamin and mineral supplements as recommended by your health care provider. What happens during an annual well check? The services and screenings done by your health care provider during your annual well check will depend on your age, overall health, lifestyle risk factors, and family history of disease. Counseling  Your health care provider may ask you questions about your:  Alcohol use.  Tobacco use.  Drug use.  Emotional well-being.  Home and relationship well-being.  Sexual activity.  Eating habits.  History of falls.  Memory and ability to understand (cognition).  Work and work Statistician.  Reproductive health. Screening  You may have the following tests or measurements:  Height, weight, and BMI.  Blood pressure.  Lipid and cholesterol levels. These may be checked every 5 years, or more frequently if you are over 75 years old.  Skin check.  Lung cancer screening. You may have this screening every year starting at age 28 if you have a 30-pack-year history of smoking and currently smoke or have quit within the past 15 years.  Fecal occult blood test (FOBT) of the stool. You may have this test every  year starting at age 34.  Flexible sigmoidoscopy or colonoscopy. You may have a  sigmoidoscopy every 5 years or a colonoscopy every 10 years starting at age 55.  Hepatitis C blood test.  Hepatitis B blood test.  Sexually transmitted disease (STD) testing.  Diabetes screening. This is done by checking your blood sugar (glucose) after you have not eaten for a while (fasting). You may have this done every 1-3 years.  Bone density scan. This is done to screen for osteoporosis. You may have this done starting at age 71.  Mammogram. This may be done every 1-2 years. Talk to your health care provider about how often you should have regular mammograms. Talk with your health care provider about your test results, treatment options, and if necessary, the need for more tests. Vaccines  Your health care provider may recommend certain vaccines, such as:  Influenza vaccine. This is recommended every year.  Tetanus, diphtheria, and acellular pertussis (Tdap, Td) vaccine. You may need a Td booster every 10 years.  Zoster vaccine. You may need this after age 30.  Pneumococcal 13-valent conjugate (PCV13) vaccine. One dose is recommended after age 15.  Pneumococcal polysaccharide (PPSV23) vaccine. One dose is recommended after age 72. Talk to your health care provider about which screenings and vaccines you need and how often you need them. This information is not intended to replace advice given to you by your health care provider. Make sure you discuss any questions you have with your health care provider. Document Released: 04/22/2015 Document Revised: 12/14/2015 Document Reviewed: 01/25/2015 Elsevier Interactive Patient Education  2017 [employer] Prevention in the Home Falls can cause injuries. They can happen to people of all ages. There are many things you can do to make your home safe and to help prevent falls. What can I do on the outside of my home?  Regularly fix the edges of walkways and driveways and fix any cracks.  Remove anything that might make you  trip as you walk through a door, such as a raised step or threshold.  Trim any bushes or trees on the path to your home.  Use bright outdoor lighting.  Clear any walking paths of anything that might make someone trip, such as rocks or tools.  Regularly check to see if handrails are loose or broken. Make sure that both sides of any steps have handrails.  Any raised decks and porches should have guardrails on the edges.  Have any leaves, snow, or ice cleared regularly.  Use sand or salt on walking paths during winter.  Clean up any spills in your garage right away. This includes oil or grease spills. What can I do in the bathroom?  Use night lights.  Install grab bars by the toilet and in the tub and shower. Do not use towel bars as grab bars.  Use non-skid mats or decals in the tub or shower.  If you need to sit down in the shower, use a plastic, non-slip stool.  Keep the floor dry. Clean up any water that spills on the floor as soon as it happens.  Remove soap buildup in the tub or shower regularly.  Attach bath mats securely with double-sided non-slip rug tape.  Do not have throw rugs and other things on the floor that can make you trip. What can I do in the bedroom?  Use night lights.  Make sure that you have a light by your bed that is easy to reach.  Do  not use any sheets or blankets that are too big for your bed. They should not hang down onto the floor.  Have a firm chair that has side arms. You can use this for support while you get dressed.  Do not have throw rugs and other things on the floor that can make you trip. What can I do in the kitchen?  Clean up any spills right away.  Avoid walking on wet floors.  Keep items that you use a lot in easy-to-reach places.  If you need to reach something above you, use a strong step stool that has a grab bar.  Keep electrical cords out of the way.  Do not use floor polish or wax that makes floors slippery. If  you must use wax, use non-skid floor wax.  Do not have throw rugs and other things on the floor that can make you trip. What can I do with my stairs?  Do not leave any items on the stairs.  Make sure that there are handrails on both sides of the stairs and use them. Fix handrails that are broken or loose. Make sure that handrails are as long as the stairways.  Check any carpeting to make sure that it is firmly attached to the stairs. Fix any carpet that is loose or worn.  Avoid having throw rugs at the top or bottom of the stairs. If you do have throw rugs, attach them to the floor with carpet tape.  Make sure that you have a light switch at the top of the stairs and the bottom of the stairs. If you do not have them, ask someone to add them for you. What else can I do to help prevent falls?  Wear shoes that:  Do not have high heels.  Have rubber bottoms.  Are comfortable and fit you well.  Are closed at the toe. Do not wear sandals.  If you use a stepladder:  Make sure that it is fully opened. Do not climb a closed stepladder.  Make sure that both sides of the stepladder are locked into place.  Ask someone to hold it for you, if possible.  Clearly mark and make sure that you can see:  Any grab bars or handrails.  First and last steps.  Where the edge of each step is.  Use tools that help you move around (mobility aids) if they are needed. These include:  Canes.  Walkers.  Scooters.  Crutches.  Turn on the lights when you go into a dark area. Replace any light bulbs as soon as they burn out.  Set up your furniture so you have a clear path. Avoid moving your furniture around.  If any of your floors are uneven, fix them.  If there are any pets around you, be aware of where they are.  Review your medicines with your doctor. Some medicines can make you feel dizzy. This can increase your chance of falling. Ask your doctor what other things that you can do to  help prevent falls. This information is not intended to replace advice given to you by your health care provider. Make sure you discuss any questions you have with your health care provider. Document Released: 01/20/2009 Document Revised: 09/01/2015 Document Reviewed: 04/30/2014 Elsevier Interactive Patient Education  2017 [employer].

## 2020-06-22 NOTE — Progress Notes (Signed)
Subjective:   [name] is a 75 y.o. female who presents for Medicare Annual (Subsequent) preventive examination.  Review of Systems    No ROS. Medicare Wellness Visit. Additional risk factors are reflected in social history. Cardiac Risk Factors include: advanced age (>28men, >44 women);dyslipidemia;hypertension;family history of premature cardiovascular disease     Objective:    Today's Vitals   06/22/20 1342  BP: (!) 160/70  Pulse: 63  Temp: 98.3 F (36.8 C)  SpO2: 98%  Weight: 185 lb (83.9 kg)  Height: 5\' 4"  (1.626 m)   Body mass index is 31.76 kg/m.  Advanced Directives 06/22/2020 05/01/2020 10/03/2019 11/28/2017 08/20/2017 09/02/2015  Does Patient Have a Medical Advance Directive? Yes No No No No No  Does patient want to make changes to medical advance directive? No - Patient declined - - - - -  Would patient like information on creating a medical advance directive? - - - No - Patient declined Yes (ED - Information included in AVS) No - patient declined information    Current Medications (verified) Outpatient Encounter Medications as of 06/22/2020  Medication Sig  . amLODipine (NORVASC) 10 MG tablet TAKE 1 TABLET BY MOUTH EVERY DAY  . aspirin EC 81 MG tablet Take 1 tablet (81 mg total) by mouth daily.  Marland Kitchen atorvastatin (LIPITOR) 20 MG tablet Take 1 tablet (20 mg total) by mouth daily.  Marland Kitchen gabapentin (NEURONTIN) 300 MG capsule TAKE 1 CAPSULE BY MOUTH EVERYDAY AT BEDTIME  . hydrALAZINE (APRESOLINE) 50 MG tablet Take 1 tablet (50 mg total) by mouth 3 (three) times daily.  . hydrochlorothiazide (HYDRODIURIL) 25 MG tablet TAKE 1 TABLET BY MOUTH EVERY DAY  . labetalol (NORMODYNE) 300 MG tablet Take 1 tablet (300 mg total) by mouth 2 (two) times daily.  Marland Kitchen omeprazole (PRILOSEC) 40 MG capsule TAKE 1 CAPSULE EVERY DAY   No facility-administered encounter medications on file as of 06/22/2020.    Allergies (verified) Benazepril hcl, Peanut-containing drug products, Shellfish  allergy, and Yellow dyes (non-tartrazine)   History: Past Medical History:  Diagnosis Date  . Absence of menstruation    amenorrhea  . Angioneurotic edema not elsewhere classified    due to ACE-I  . Breast disorder    breast cancer 2007  . Cancer [facility]) 2007   Left Breast Cancer  . Carotid stenosis    Carotid US (8/15):  RICA 87-68%; LICA 11-57% >>> FU 1 year  . H/O: rheumatic fever   . History of breast cancer 07/13/2015  . HLD (hyperlipidemia)   . Occlusion and stenosis of carotid artery without mention of cerebral infarction   . Other abnormal glucose    fasting hyperglycemia  . Personal history of malignant neoplasm of breast   . Personal history of radiation therapy 2007  . Unspecified essential hypertension    Past Surgical History:  Procedure Laterality Date  . APPENDECTOMY    . BREAST BIOPSY Left 12/06/2005   malignant  . BREAST LUMPECTOMY Left 01/08/2006  . BREAST LUMPECTOMY Left 01/08/2006  . COLONOSCOPY  2005   negative; Dr Collene Mares  . COLONOSCOPY N/A 09/02/2015   Procedure: COLONOSCOPY;  Surgeon: Danie Binder, MD;  [facility];  Service: Endoscopy;  Laterality: N/A;  1215-moved to 1130 Ginger to notify pt  . G0 P0     Dr. Maudry Diego   Family History  Problem Relation Age of Onset  . Breast cancer Sister   . Hypertension Other        some of Pts  brothers and sisters  . Heart attack Neg Hx   . Stroke Neg Hx   . Diabetes Neg Hx   . Heart disease Neg Hx    Social History   Socioeconomic History  . Marital status: Single    Spouse name: Not on file  . Number of children: Not on file  . Years of education: Not on file  . Highest education level: Not on file  Occupational History  . Not on file  Tobacco Use  . Smoking status: Current Some Day Smoker    Packs/day: 0.50    Years: 48.00    Pack years: 24.00    Types: Cigarettes  . Smokeless tobacco: Never Used  Vaping Use  . Vaping Use: Never used  Substance and Sexual Activity  . Alcohol use:  Yes    Comment: occ  . Drug use: No  . Sexual activity: Yes    Birth control/protection: Post-menopausal  Other Topics Concern  . Not on file  Social History Narrative   Single, no children.   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    Social Determinants of Health   Financial Resource Strain: Low Risk   . Difficulty of Paying Living Expenses: Not hard at all  Food Insecurity: No Food Insecurity  . Worried About Charity fundraiser in the Last Year: Never true  . Ran Out of Food in the Last Year: Never true  Transportation Needs: No Transportation Needs  . Lack of Transportation (Medical): No  . Lack of Transportation (Non-Medical): No  Physical Activity: Sufficiently Active  . Days of Exercise per Week: 5 days  . Minutes of Exercise per Session: 30 min  Stress: No Stress Concern Present  . Feeling of Stress : Not at all  Social Connections: Moderately Integrated  . Frequency of Communication with Friends and Family: More than three times a week  . Frequency of Social Gatherings with Friends and Family: More than three times a week  . Attends Religious Services: More than 4 times per year  . Active Member of Clubs or Organizations: Yes  . Attends Archivist Meetings: More than 4 times per year  . Marital Status: Never married    Tobacco Counseling Ready to quit: Not Answered Counseling given: Not Answered   Clinical Intake:  Pre-visit preparation completed: Yes  Pain : No/denies pain     BMI - recorded: 31.76 Nutritional Status: BMI > 30  Obese Nutritional Risks: None Diabetes: No  How often do you need to have someone help you when you read instructions, pamphlets, or other written materials from your doctor or pharmacy?: 1 - Never What is the last grade level you completed in school?: Degree from [employer]  Diabetic? no  Interpreter Needed?: No  Information entered by :: Lisette Abu, LPN   Activities of Daily Living In your  present state of health, do you have any difficulty performing the following activities: 06/22/2020 05/03/2020  Hearing? N N  Vision? N N  Difficulty concentrating or making decisions? N N  Walking or climbing stairs? N N  Dressing or bathing? N N  Doing errands, shopping? N N  Preparing Food and eating ? N -  Using the Toilet? N -  In the past six months, have you accidently leaked urine? N -  Do you have problems with loss of bowel control? N -  Managing your Medications? N -  Managing your Finances? N -  Housekeeping or managing your  Housekeeping? N -  Some recent data might be hidden    Patient Care Team: Binnie Rail, MD as PCP - General (Internal Medicine) Sherren Mocha, MD as PCP - Cardiology (Cardiology) Charlton Haws, [facility] as Pharmacist (Pharmacist) Charlton Haws, [facility] as Pharmacist (Pharmacist) Hortencia Pilar, MD as Consulting Physician (Ophthalmology) Servando Salina, MD as Consulting Physician (Obstetrics and Gynecology)  Indicate any recent Medical Services you may have received from other than [facility] providers in the past year (date may be approximate).     Assessment:   This is a routine wellness examination for [name].  Hearing/Vision screen No exam data present  Dietary issues and exercise activities discussed: Current Exercise Habits: Home exercise routine, Type of exercise: walking, Time (Minutes): 30, Frequency (Times/Week): 5, Weekly Exercise (Minutes/Week): 150, Intensity: Moderate, Exercise limited by: None identified  Goals    . Patient Stated     I want to start to go to the [employer] 2-3 times weekly on Mon. Wed. Fri.  Monitor my diet for sugar, carbohydrates and fat. Decrease the amount of cigarettes I smoke daily until I eventually quit.     Marland Kitchen Pharmacy Care Plan    . Quit Smoking      Depression Screen PHQ 2/9 Scores 06/22/2020 06/05/2019 05/21/2018 08/20/2017 03/09/2015 07/31/2012  PHQ - 2 Score 0 0 0 0 0 0  PHQ- 9 Score - - -  0 - -    Fall Risk Fall Risk  06/22/2020 06/05/2019 02/23/2019 05/21/2018 08/20/2017  Falls in the past year? 0 0 0 0 No  Number falls in past yr: 0 0 0 - -  Injury with Fall? 0 0 - - -  Risk for fall due to : No Fall Risks - - - -  Follow up Falls evaluation completed - - - -    FALL RISK PREVENTION PERTAINING TO THE HOME:  Any stairs in or around the home? No  If so, are there any without handrails? No  Home free of loose throw rugs in walkways, pet beds, electrical cords, etc? Yes  Adequate lighting in your home to reduce risk of falls? Yes   ASSISTIVE DEVICES UTILIZED TO PREVENT FALLS:  Life alert? No  Use of a cane, walker or w/c? No  Grab bars in the bathroom? No  Shower chair or bench in shower? No  Elevated toilet seat or a handicapped toilet? No   TIMED UP AND GO:  Was the test performed? No .  Length of time to ambulate 10 feet: 0 sec.   Gait steady and fast without use of assistive device  Cognitive Function: Normal cognitive status assessed by direct observation by this Nurse Health Advisor. No abnormalities found.          Immunizations Immunization History  Administered Date(s) Administered  . Moderna Sars-Covid-2 Vaccination 06/20/2019, 07/18/2019, 02/25/2020    TDAP status: Due, Education has been provided regarding the importance of this vaccine. Advised may receive this vaccine at local pharmacy or Health Dept. Aware to provide a copy of the vaccination record if obtained from local pharmacy or Health Dept. Verbalized acceptance and understanding.  (patient declined all vaccines)  Flu Vaccine status: Declined, Education has been provided regarding the importance of this vaccine but patient still declined. Advised may receive this vaccine at local pharmacy or Health Dept. Aware to provide a copy of the vaccination record if obtained from local pharmacy or Health Dept. Verbalized acceptance and understanding.  Pneumococcal vaccine status: Declined,  Education has been provided regarding the importance of this vaccine but patient still declined. Advised may receive this vaccine at local pharmacy or Health Dept. Aware to provide a copy of the vaccination record if obtained from local pharmacy or Health Dept. Verbalized acceptance and understanding.   Covid-19 vaccine status: Completed vaccines  Qualifies for Shingles Vaccine? Yes   Zostavax completed No   Shingrix Completed?: No.    Education has been provided regarding the importance of this vaccine. Patient has been advised to call insurance company to determine out of pocket expense if they have not yet received this vaccine. Advised may also receive vaccine at local pharmacy or Health Dept. Verbalized acceptance and understanding.  Screening Tests Health Maintenance  Topic Date Due  . TETANUS/TDAP  Never done  . PNA vac Low Risk Adult (1 of 2 - PCV13) Never done  . INFLUENZA VACCINE  Never done  . MAMMOGRAM  12/23/2021  . DEXA SCAN  06/12/2023  . COLONOSCOPY (Pts 45-10yrs Insurance coverage will need to be confirmed)  09/01/2025  . COVID-19 Vaccine  Completed  . Hepatitis C Screening  Completed  . HPV VACCINES  Aged Out    Health Maintenance  Health Maintenance Due  Topic Date Due  . TETANUS/TDAP  Never done  . PNA vac Low Risk Adult (1 of 2 - PCV13) Never done  . INFLUENZA VACCINE  Never done    Colorectal cancer screening: Type of screening: Colonoscopy. Completed 09/02/2015. Repeat every 10 years  Mammogram status: Completed 12/24/2019. Repeat every year  Bone Density status: Completed 06/12/2018. Results reflect: Bone density results: OSTEOPENIA. Repeat every 5 years.  Lung Cancer Screening: (Low Dose CT Chest recommended if Age 42-80 years, 30 pack-year currently smoking OR have quit w/in 15years.) does qualify.   Lung Cancer Screening Referral: no  Additional Screening:  Hepatitis C Screening: does qualify; Completed yes  Vision Screening: Recommended annual  ophthalmology exams for early detection of glaucoma and other disorders of the eye. Is the patient up to date with their annual eye exam?  Yes  Who is the provider or what is the name of the office in which the patient attends annual eye exams? Marshall Cork MD, at [facility] If pt is not established with a provider, would they like to be referred to a provider to establish care? No .   Dental Screening: Recommended annual dental exams for proper oral hygiene  Community Resource Referral / Chronic Care Management: CRR required this visit?  No   CCM required this visit?  No      Plan:     I have personally reviewed and noted the following in the patient's chart:   . Medical and social history . Use of alcohol, tobacco or illicit drugs  . Current medications and supplements . Functional ability and status . Nutritional status . Physical activity . Advanced directives . List of other physicians . Hospitalizations, surgeries, and ER visits in previous 12 months . Vitals . Screenings to include cognitive, depression, and falls . Referrals and appointments  In addition, I have reviewed and discussed with patient certain preventive protocols, quality metrics, and best practice recommendations. A written personalized care plan for preventive services as well as general preventive health recommendations were provided to patient.     Sheral Flow, LPN   [id number]/[id number]   Nurse Notes:  Medications reviewed with patient; no opioid use noted.

## 2020-06-27 ENCOUNTER — Other Ambulatory Visit: Payer: Self-pay

## 2020-06-27 ENCOUNTER — Ambulatory Visit (HOSPITAL_COMMUNITY)
Admission: RE | Admit: 2020-06-27 | Payer: Medicare (Managed Care) | Source: Ambulatory Visit | Attending: Cardiovascular Disease | Admitting: Cardiovascular Disease

## 2020-06-27 ENCOUNTER — Other Ambulatory Visit: Payer: Medicare (Managed Care) | Admitting: *Deleted

## 2020-06-27 ENCOUNTER — Ambulatory Visit (HOSPITAL_COMMUNITY)
Admission: RE | Admit: 2020-06-27 | Discharge: 2020-06-27 | Disposition: A | Discharge status: Home / Self Care | Payer: Medicare (Managed Care) | Source: Ambulatory Visit | Attending: Internal Medicine | Admitting: Internal Medicine

## 2020-06-27 DIAGNOSIS — E782 Mixed hyperlipidemia: Secondary | ICD-10-CM

## 2020-06-27 DIAGNOSIS — I10 Essential (primary) hypertension: Secondary | ICD-10-CM | POA: Diagnosis not present

## 2020-06-27 DIAGNOSIS — I6523 Occlusion and stenosis of bilateral carotid arteries: Secondary | ICD-10-CM

## 2020-06-28 LAB — CBC WITH DIFFERENTIAL/PLATELET
Basophils Absolute: 0 10*3/uL (ref 0.0–0.2)
Basos: 0 %
EOS (ABSOLUTE): 0.1 10*3/uL (ref 0.0–0.4)
Eos: 1 %
Hematocrit: 40.3 % (ref 34.0–46.6)
Hemoglobin: 13.3 g/dL (ref 11.1–15.9)
Immature Grans (Abs): 0 10*3/uL (ref 0.0–0.1)
Immature Granulocytes: 0 %
Lymphocytes Absolute: 4.4 10*3/uL — ABNORMAL HIGH (ref 0.7–3.1)
Lymphs: 45 %
MCH: 27.9 pg (ref 26.6–33.0)
MCHC: 33 g/dL (ref 31.5–35.7)
MCV: 85 fL (ref 79–97)
Monocytes Absolute: 0.5 10*3/uL (ref 0.1–0.9)
Monocytes: 5 %
Neutrophils Absolute: 4.7 10*3/uL (ref 1.4–7.0)
Neutrophils: 49 %
Platelets: 287 10*3/uL (ref 150–450)
RBC: 4.77 x10E6/uL (ref 3.77–5.28)
RDW: 13.1 % (ref 11.7–15.4)
WBC: 9.7 10*3/uL (ref 3.4–10.8)

## 2020-06-28 LAB — COMPREHENSIVE METABOLIC PANEL
ALT: 11 IU/L (ref 0–32)
AST: 18 IU/L (ref 0–40)
Albumin/Globulin Ratio: 1.2 (ref 1.2–2.2)
Albumin: 4.2 g/dL (ref 3.7–4.7)
Alkaline Phosphatase: 89 IU/L (ref 44–121)
BUN/Creatinine Ratio: 17 (ref 12–28)
BUN: 15 mg/dL (ref 8–27)
Bilirubin Total: 0.4 mg/dL (ref 0.0–1.2)
CO2: 20 mmol/L (ref 20–29)
Calcium: 9.5 mg/dL (ref 8.7–10.3)
Chloride: 97 mmol/L (ref 96–106)
Creatinine, Ser: 0.9 mg/dL (ref 0.57–1.00)
Globulin, Total: 3.5 g/dL (ref 1.5–4.5)
Glucose: 89 mg/dL (ref 65–99)
Potassium: 4 mmol/L (ref 3.5–5.2)
Sodium: 136 mmol/L (ref 134–144)
Total Protein: 7.7 g/dL (ref 6.0–8.5)
eGFR: 68 mL/min/{1.73_m2} (ref >59)

## 2020-06-28 LAB — LIPID PANEL
Chol/HDL Ratio: 3.6 ratio (ref 0.0–4.4)
Cholesterol, Total: 186 mg/dL (ref 100–199)
HDL: 52 mg/dL (ref >39)
LDL Chol Calc (NIH): 110 mg/dL — ABNORMAL HIGH (ref 0–99)
Triglycerides: 138 mg/dL (ref 0–149)
VLDL Cholesterol Cal: 24 mg/dL (ref 5–40)

## 2020-07-18 ENCOUNTER — Telehealth: Payer: Self-pay

## 2020-07-18 DIAGNOSIS — E782 Mixed hyperlipidemia: Secondary | ICD-10-CM

## 2020-07-18 NOTE — Telephone Encounter (Signed)
-----   Message from Sherren Mocha, MD sent at 06/28/2020  6:42 AM EDT ----- Recommend patient start back on atorvastatin 20 mg daily. Repeat lipids/LFT's 3 months. thanks

## 2020-07-18 NOTE — Telephone Encounter (Signed)
Reviewed results with patient who verbalized understanding.  The patient has restarted atorvastatin 20mg .  Scheduled her for fasting labs 09/19/20. She was grateful for call and agrees with plan.

## 2020-08-21 ENCOUNTER — Other Ambulatory Visit: Payer: Self-pay | Admitting: Internal Medicine

## 2020-09-15 ENCOUNTER — Other Ambulatory Visit: Payer: Self-pay | Admitting: Internal Medicine

## 2020-09-19 ENCOUNTER — Other Ambulatory Visit: Payer: Medicare (Managed Care)

## 2020-09-19 ENCOUNTER — Other Ambulatory Visit: Payer: Self-pay

## 2020-09-19 DIAGNOSIS — E782 Mixed hyperlipidemia: Secondary | ICD-10-CM

## 2020-09-19 NOTE — Progress Notes (Signed)
dg had 1 unsuccessful stick and she refused  the 2nd stick

## 2020-09-22 ENCOUNTER — Encounter: Payer: Self-pay | Admitting: Internal Medicine

## 2020-11-16 ENCOUNTER — Other Ambulatory Visit: Payer: Self-pay | Admitting: Internal Medicine

## 2020-11-16 DIAGNOSIS — Z1231 Encounter for screening mammogram for malignant neoplasm of breast: Secondary | ICD-10-CM

## 2020-12-15 ENCOUNTER — Other Ambulatory Visit: Payer: Self-pay | Admitting: Internal Medicine

## 2020-12-29 ENCOUNTER — Other Ambulatory Visit: Payer: Self-pay

## 2020-12-29 ENCOUNTER — Ambulatory Visit
Admission: RE | Admit: 2020-12-29 | Discharge: 2020-12-29 | Disposition: A | Discharge status: Home / Self Care | Payer: Medicare (Managed Care) | Source: Ambulatory Visit | Attending: Internal Medicine | Admitting: Internal Medicine

## 2020-12-29 DIAGNOSIS — Z1231 Encounter for screening mammogram for malignant neoplasm of breast: Secondary | ICD-10-CM

## 2020-12-29 HISTORY — DX: Malignant neoplasm of unspecified site of unspecified female breast: C50.919

## 2021-01-30 ENCOUNTER — Telehealth: Payer: Self-pay | Admitting: Cardiovascular Disease

## 2021-01-30 DIAGNOSIS — Z79899 Other long term (current) drug therapy: Secondary | ICD-10-CM

## 2021-01-30 DIAGNOSIS — E782 Mixed hyperlipidemia: Secondary | ICD-10-CM

## 2021-01-30 NOTE — Telephone Encounter (Signed)
Spoke with the pt and she still wants to come inf or her Lipid and Hepatic as she would have had a few months ago... per Katrina in the lab when the pt was here to have them drawn 09/2020 she denied allowing a second phlebotomy stick so new orders will need to be placed.. Pt advised and she will be in tomorrow for her fasting labs.

## 2021-01-30 NOTE — Telephone Encounter (Signed)
[name] is calling requesting her lab orders she was scheduled for back in June be reinstated. She states she came in for the labs, but the phlebotomist missed her vein so she left and did not end up having them drawn. She is requesting a callback or message through my chart be made once the orders are resubmitted, so they can be scheduled. Please advise.

## 2021-01-31 ENCOUNTER — Other Ambulatory Visit: Payer: Self-pay

## 2021-01-31 ENCOUNTER — Other Ambulatory Visit: Payer: Medicare (Managed Care)

## 2021-01-31 DIAGNOSIS — E782 Mixed hyperlipidemia: Secondary | ICD-10-CM

## 2021-01-31 DIAGNOSIS — Z79899 Other long term (current) drug therapy: Secondary | ICD-10-CM

## 2021-01-31 LAB — LIPID PANEL
Chol/HDL Ratio: 2.8 ratio (ref 0.0–4.4)
Cholesterol, Total: 181 mg/dL (ref 100–199)
HDL: 64 mg/dL (ref >39)
LDL Chol Calc (NIH): 95 mg/dL (ref 0–99)
Triglycerides: 127 mg/dL (ref 0–149)
VLDL Cholesterol Cal: 22 mg/dL (ref 5–40)

## 2021-01-31 LAB — HEPATIC FUNCTION PANEL
ALT: 6 IU/L (ref 0–32)
AST: 13 IU/L (ref 0–40)
Albumin: 4.3 g/dL (ref 3.7–4.7)
Alkaline Phosphatase: 107 IU/L (ref 44–121)
Bilirubin Total: 0.6 mg/dL (ref 0.0–1.2)
Bilirubin, Direct: 0.18 mg/dL (ref 0.00–0.40)
Total Protein: 7.5 g/dL (ref 6.0–8.5)

## 2021-02-09 ENCOUNTER — Encounter: Payer: Self-pay | Admitting: *Deleted

## 2021-04-07 ENCOUNTER — Other Ambulatory Visit: Payer: Self-pay | Admitting: Internal Medicine

## 2021-04-08 ENCOUNTER — Other Ambulatory Visit: Payer: Self-pay | Admitting: Internal Medicine

## 2021-04-24 ENCOUNTER — Other Ambulatory Visit: Payer: Self-pay | Admitting: Internal Medicine

## 2021-05-16 ENCOUNTER — Other Ambulatory Visit: Payer: Self-pay | Admitting: Internal Medicine

## 2021-05-23 ENCOUNTER — Other Ambulatory Visit: Payer: Self-pay | Admitting: Internal Medicine

## 2021-05-23 ENCOUNTER — Other Ambulatory Visit: Payer: Self-pay | Admitting: Cardiovascular Disease

## 2021-07-13 ENCOUNTER — Ambulatory Visit: Payer: Medicare (Managed Care) | Admitting: Cardiovascular Disease

## 2021-07-13 NOTE — Progress Notes (Deleted)
?Cardiology Office Note:   ? ?Date:  07/13/2021  ? ?ID:  [name], DOB 06-25-1945, MRN [id number] ? ?PCP:  Binnie Rail, MD ?  ?[facility] Providers ?Cardiologist:  Sherren Mocha, MD    ? ?Referring MD: Binnie Rail, MD  ? ?No chief complaint on file. ?*** ? ?History of Present Illness:   ? ?[name] is a 76 y.o. female with a hx of HTN, mixed hyperlipidemia, and mild bilateral carotid atherosclerosis. She is a longtime current every day smoker, presenting for follow-up evaluation.  ? ?Past Medical History:  ?Diagnosis Date  ? Absence of menstruation   ? amenorrhea  ? Angioneurotic edema not elsewhere classified   ? due to ACE-I  ? Breast cancer ([place])   ? Breast disorder   ? breast cancer 2007  ? Cancer [facility]) 2007  ? Left Breast Cancer  ? Carotid stenosis   ? Carotid US (8/15):  RICA 97-02%; LICA 63-78% >>> FU 1 year  ? H/O: rheumatic fever   ? History of breast cancer 07/13/2015  ? HLD (hyperlipidemia)   ? Occlusion and stenosis of carotid artery without mention of cerebral infarction   ? Other abnormal glucose   ? fasting hyperglycemia  ? Personal history of malignant neoplasm of breast   ? Personal history of radiation therapy 2007  ? Unspecified essential hypertension   ? ? ?Past Surgical History:  ?Procedure Laterality Date  ? APPENDECTOMY    ? BREAST BIOPSY Left 12/06/2005  ? malignant  ? BREAST LUMPECTOMY Left 01/08/2006  ? BREAST LUMPECTOMY Left 01/08/2006  ? COLONOSCOPY  2005  ? negative; Dr Collene Mares  ? COLONOSCOPY N/A 09/02/2015  ? Procedure: COLONOSCOPY;  Surgeon: Danie Binder, MD;  [facility];  Service: Endoscopy;  Laterality: N/A;  1215-moved to 1130 Ginger to notify pt  ? G0 P0    ? Dr. Maudry Diego  ? ? ?Current Medications: ?No outpatient medications have been marked as taking for the 07/13/21 encounter (Appointment) with Sherren Mocha, MD.  ?  ? ?Allergies:   Benazepril hcl, Peanut-containing drug products, Shellfish allergy, and Yellow dyes (non-tartrazine)  ? ?Social History   ? ?Socioeconomic History  ? Marital status: Single  ?  Spouse name: Not on file  ? Number of children: Not on file  ? Years of education: Not on file  ? Highest education level: Not on file  ?Occupational History  ? Not on file  ?Tobacco Use  ? Smoking status: Some Days  ?  Packs/day: 0.50  ?  Years: 48.00  ?  Pack years: 24.00  ?  Types: Cigarettes  ? Smokeless tobacco: Never  ?Vaping Use  ? Vaping Use: Never used  ?Substance and Sexual Activity  ? Alcohol use: Yes  ?  Comment: occ  ? Drug use: No  ? Sexual activity: Yes  ?  Birth control/protection: Post-menopausal  ?Other Topics Concern  ? Not on file  ?Social History Narrative  ? Single, no children.  ? 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   ? ?Social Determinants of Health  ? ?Financial Resource Strain: Not on file  ?Food Insecurity: Not on file  ?Transportation Needs: Not on file  ?Physical Activity: Not on file  ?Stress: Not on file  ?Social Connections: Not on file  ?  ? ?Family History: ?The patient's family history includes Breast cancer in her sister; Hypertension in an other family member. There is no history of Heart attack, Stroke, Diabetes, or Heart disease. ? ?  ROS:   ?Please see the history of present illness.    ?All other systems reviewed and are negative. ? ?EKGs/Labs/Other Studies Reviewed:   ? ?The following studies were reviewed today: ?Carotid US 06/27/2020: ?Summary:  ?Right Carotid: Velocities in the right ICA are consistent with a 1-39%  ?stenosis.  ?               Non-hemodynamically significant plaque <50% noted in the  ?CCA.  ? ?Left Carotid: Velocities in the left ICA are consistent with a 1-39%  ?stenosis.  ?              Non-hemodynamically significant plaque <50% noted in the  ?CCA.  ? ?Vertebrals:  Bilateral vertebral arteries demonstrate antegrade flow.  ?Subclavians: Normal flow hemodynamics were seen in bilateral subclavian  ?             arteries.  ? ?EKG:  EKG is *** ordered today.  The ekg ordered today demonstrates  *** ? ?Recent Labs: ?01/31/2021: ALT 6  ?Recent Lipid Panel ?   ?Component Value Date/Time  ? CHOL 181 01/31/2021 1027  ? TRIG 127 01/31/2021 1027  ? HDL 64 01/31/2021 1027  ? CHOLHDL 2.8 01/31/2021 1027  ? CHOLHDL 4 06/05/2019 1122  ? VLDL 27.0 06/05/2019 1122  ? [place] 95 01/31/2021 1027  ? LDLDIRECT 141.8 07/31/2012 1537  ? ? ? ?Risk Assessment/Calculations:   ?{Does this patient have ATRIAL FIBRILLATION?[phone]} ? ?    ? ?Physical Exam:   ? ?VS:  There were no vitals taken for this visit.   ? ?Wt Readings from Last 3 Encounters:  ?06/22/20 185 lb (83.9 kg)  ?06/21/20 185 lb (83.9 kg)  ?05/03/20 183 lb (83 kg)  ?  ? ?GEN: *** Well nourished, well developed in no acute distress ?HEENT: Normal ?NECK: No JVD; No carotid bruits ?LYMPHATICS: No lymphadenopathy ?CARDIAC: ***RRR, no murmurs, rubs, gallops ?RESPIRATORY:  Clear to auscultation without rales, wheezing or rhonchi  ?ABDOMEN: Soft, non-tender, non-distended ?MUSCULOSKELETAL:  No edema; No deformity  ?SKIN: Warm and dry ?NEUROLOGIC:  Alert and oriented x 3 ?PSYCHIATRIC:  Normal affect  ? ?ASSESSMENT:   ? ?1. Mixed hyperlipidemia   ?2. Bilateral carotid artery stenosis   ?3. Essential hypertension   ?4. Tobacco abuse   ? ?PLAN:   ? ?In order of problems listed above: ? ?*** ? ?   ? ?{Are you ordering a CV Procedure (e.g. stress test, cath, DCCV, TEE, etc)?   Press F2        :[id number]}  ? ? ?Medication Adjustments/Labs and Tests Ordered: ?Current medicines are reviewed at length with the patient today.  Concerns regarding medicines are outlined above.  ?No orders of the defined types were placed in this encounter. ? ?No orders of the defined types were placed in this encounter. ? ? ?There are no Patient Instructions on file for this visit.  ? ?Signed, ?Sherren Mocha, MD  ?07/13/2021 12:08 PM    ?[place] ?

## 2021-08-11 ENCOUNTER — Other Ambulatory Visit: Payer: Self-pay | Admitting: Internal Medicine

## 2021-08-11 ENCOUNTER — Other Ambulatory Visit: Payer: Self-pay | Admitting: Cardiovascular Disease

## 2021-11-06 ENCOUNTER — Other Ambulatory Visit: Payer: Self-pay | Admitting: Cardiovascular Disease

## 2021-11-06 ENCOUNTER — Other Ambulatory Visit: Payer: Self-pay | Admitting: Internal Medicine

## 2021-11-19 ENCOUNTER — Other Ambulatory Visit: Payer: Self-pay | Admitting: Cardiovascular Disease

## 2021-11-27 ENCOUNTER — Ambulatory Visit: Payer: Medicare (Managed Care) | Admitting: Cardiovascular Disease

## 2021-11-27 ENCOUNTER — Encounter: Payer: Self-pay | Admitting: Cardiovascular Disease

## 2021-11-27 VITALS — BP 215/96 | HR 57 | Ht 64.0 in | Wt 185.8 lb

## 2021-11-27 DIAGNOSIS — E782 Mixed hyperlipidemia: Secondary | ICD-10-CM | POA: Diagnosis not present

## 2021-11-27 DIAGNOSIS — I10 Essential (primary) hypertension: Secondary | ICD-10-CM

## 2021-11-27 DIAGNOSIS — Z720 Tobacco use: Secondary | ICD-10-CM

## 2021-11-27 DIAGNOSIS — I6523 Occlusion and stenosis of bilateral carotid arteries: Secondary | ICD-10-CM | POA: Diagnosis not present

## 2021-11-27 MED ORDER — LABETALOL HCL 300 MG PO TABS: 300.0000 mg | ORAL_TABLET | Freq: Two times a day (BID) | ORAL | 1 refills | Status: DC

## 2021-11-27 MED ORDER — HYDROCHLOROTHIAZIDE 25 MG PO TABS: 25.0000 mg | ORAL_TABLET | Freq: Every day | ORAL | 3 refills | Status: DC

## 2021-11-27 NOTE — Progress Notes (Signed)
Cardiology Office Note:    Date:  11/27/2021   ID:  [name], DOB 1945/06/27, MRN [id number]  PCP:  Binnie Rail, MD   [facility] Providers Cardiologist:  Sherren Mocha, MD     Referring MD: Binnie Rail, MD   Chief Complaint  Patient presents with   Hypertension    History of Present Illness:    [name] is a 76 y.o. female with a hx of hypertension, hyperlipidemia, and carotid atherosclerosis without significant stenosis.  The patient is here alone today.  She works at the [employer].  The patient is here alone today.  She reports no change in any of her cardiopulmonary symptoms.  She has had no chest pain, chest pressure, edema, orthopnea, or PND.  She does have shortness of breath with activity and continues to smoke cigarettes.  This is unchanged over time.  She denies hemoptysis.  She has noticed a lump in her right breast and is seeing her primary care physician in 48 hours to have this evaluated.  She has run out of several of her blood pressure medications and currently is not taking hydralazine, hydrochlorothiazide, or labetalol.  She denies headache, vision changes, lightheadedness, or syncope.  Past Medical History:  Diagnosis Date   Absence of menstruation    amenorrhea   Angioneurotic edema not elsewhere classified    due to ACE-I   Breast cancer ([place])    Breast disorder    breast cancer 2007   Cancer [facility]) 2007   Left Breast Cancer   Carotid stenosis    Carotid US (8/15):  RICA 39-03%; LICA 00-92% >>> FU 1 year   H/O: rheumatic fever    History of breast cancer 07/13/2015   HLD (hyperlipidemia)    Occlusion and stenosis of carotid artery without mention of cerebral infarction    Other abnormal glucose    fasting hyperglycemia   Personal history of malignant neoplasm of breast    Personal history of radiation therapy 2007   Unspecified essential hypertension     Past Surgical History:  Procedure Laterality Date    APPENDECTOMY     BREAST BIOPSY Left 12/06/2005   malignant   BREAST LUMPECTOMY Left 01/08/2006   BREAST LUMPECTOMY Left 01/08/2006   COLONOSCOPY  2005   negative; Dr Collene Mares   COLONOSCOPY N/A 09/02/2015   Procedure: COLONOSCOPY;  Surgeon: Danie Binder, MD;  Location: [facility];  Service: Endoscopy;  Laterality: N/A;  1215-moved to 1130 [address]inger to notify pt   G0 P0     Dr. Maudry Diego    Current Medications: No outpatient medications have been marked as taking for the 11/27/21 encounter (Office Visit) with Sherren Mocha, MD.     Allergies:   Benazepril hcl, Peanut-containing drug products, Shellfish allergy, and Yellow dyes (non-tartrazine)   Social History   Socioeconomic History   Marital status: Single    Spouse name: Not on file   Number of children: Not on file   Years of education: Not on file   Highest education level: Not on file  Occupational History   Not on file  Tobacco Use   Smoking status: Some Days    Packs/day: 0.50    Years: 48.00    Total pack years: 24.00    Types: Cigarettes   Smokeless tobacco: Never  Vaping Use   Vaping Use: Never used  Substance and Sexual Activity   Alcohol use: Yes    Comment: occ   Drug use: No  Sexual activity: Yes    Birth control/protection: Post-menopausal  Other Topics Concern   Not on file  Social History Narrative   Single, no children.   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    Social Determinants of Health   Financial Resource Strain: Low Risk  (06/22/2020)   Overall Financial Resource Strain (CARDIA)    Difficulty of Paying Living Expenses: Not hard at all  Food Insecurity: No Food Insecurity (06/22/2020)   Hunger Vital Sign    Worried About Running Out of Food in the Last Year: Never true    Ran Out of Food in the Last Year: Never true  Transportation Needs: No Transportation Needs (06/22/2020)   PRAPARE - Hydrologist (Medical): No    Lack of Transportation (Non-Medical):  No  Physical Activity: Sufficiently Active (06/22/2020)   Exercise Vital Sign    Days of Exercise per Week: 5 days    Minutes of Exercise per Session: 30 min  Stress: No Stress Concern Present (06/22/2020)   [place]    Feeling of Stress : Not at all  Social Connections: Moderately Integrated (06/22/2020)   Social Connection and Isolation Panel [NHANES]    Frequency of Communication with Friends and Family: More than three times a week    Frequency of Social Gatherings with Friends and Family: More than three times a week    Attends Religious Services: More than 4 times per year    Active Member of [employer] or Organizations: Yes    Attends Music therapist: More than 4 times per year    Marital Status: Never married     Family History: The patient's family history includes Breast cancer in her sister; Hypertension in an other family member. There is no history of Heart attack, Stroke, Diabetes, or Heart disease.  ROS:   Please see the history of present illness.    All other systems reviewed and are negative.  EKGs/Labs/Other Studies Reviewed:    The following studies were reviewed today: Carotid US 06/27/2020: Summary:  Right Carotid: Velocities in the right ICA are consistent with a 1-39%  stenosis.                 Non-hemodynamically significant plaque <50% noted in the  CCA.   Left Carotid: Velocities in the left ICA are consistent with a 1-39%  stenosis.                Non-hemodynamically significant plaque <50% noted in the  CCA.   Vertebrals:  Bilateral vertebral arteries demonstrate antegrade flow.  Subclavians: Normal flow hemodynamics were seen in bilateral subclavian               arteries.   EKG:  EKG is ordered today.  The ekg ordered today demonstrates sinus bradycardia   Recent Labs: 01/31/2021: ALT 6  Recent Lipid Panel    Component Value Date/Time   CHOL 181 01/31/2021 1027    TRIG 127 01/31/2021 1027   HDL 64 01/31/2021 1027   CHOLHDL 2.8 01/31/2021 1027   CHOLHDL 4 06/05/2019 1122   VLDL 27.0 06/05/2019 1122   LDLCALC 95 01/31/2021 1027   LDLDIRECT 141.8 07/31/2012 1537     Risk Assessment/Calculations:      HYPERTENSION CONTROL Vitals:   11/27/21 1449 11/27/21 1517  BP: (!) 168/98 (!) 215/96    The patient's blood pressure is elevated above target today.  In order  to address the patient's elevated BP: A new medication was prescribed today.; A referral to the Pharm[facility] Hypertension Clinic will be placed.            Physical Exam:    VS:  BP (!) 215/96   Pulse (!) 57   Ht '5\' 4"'$  (1.626 m)   Wt 185 lb 12.8 oz (84.3 kg)   SpO2 97%   BMI 31.89 kg/m     Wt Readings from Last 3 Encounters:  11/27/21 185 lb 12.8 oz (84.3 kg)  06/22/20 185 lb (83.9 kg)  06/21/20 185 lb (83.9 kg)     GEN:  Well nourished, well developed in no acute distress HEENT: Normal NECK: No JVD; bilateral carotid bruits right greater than left LYMPHATICS: No lymphadenopathy CARDIAC: RRR, 2/6 systolic ejection murmur at the left upper sternal border, no S4 RESPIRATORY:  Clear to auscultation without rales, wheezing or rhonchi  ABDOMEN: Soft, non-tender, non-distended MUSCULOSKELETAL:  No edema; No deformity  SKIN: Warm and dry NEUROLOGIC:  Alert and oriented x 3 PSYCHIATRIC:  Normal affect   ASSESSMENT:    1. Mixed hyperlipidemia   2. Essential hypertension   3. Tobacco abuse   4. Bilateral carotid artery stenosis    PLAN:    In order of problems listed above:  Continue atorvastatin.  Lipids followed by her PCP.  Last lipids with a cholesterol 181, HDL 64, LDL 95. Nonobstructive carotid stenosis on most recent carotid duplex.  She has a right carotid bruit and a soft left carotid bruit.  With ongoing tobacco abuse and exam findings suggestive of carotid stenosis, recommend repeat carotid duplex scan The patient's blood pressure is uncontrolled.  On further  questioning, she is not taking multiple medications.  She is only on amlodipine for blood pressure right now.  She has stopped taking hydralazine, hydrochlorothiazide, and labetalol.  On my repeat today her blood pressure was markedly elevated at 215/98.  Again, she has no symptoms at all and specifically denies headache, visual symptoms, chest discomfort, or shortness of breath at rest.  We will call in labetalol and hydrochlorothiazide at the previous doses of 300 mg twice daily and 25 mg daily, respectively.  I advised her to go directly to her pharmacy, pick up her medications, and start them both today.  She has follow-up with her primary care physician in 48 hours and her blood pressure can be repeated there.  I am going to refer her to our Pharm.D. hypertension clinic for further medication titration.  I suspect she will need hydralazine added back to her regimen, but I did not want to start her on 3 new antihypertensive medications all at one time. Cessation counseling done.  Noncardiac issues: She reports a palpable mass in the right breast.  She has primary care evaluation in 48 hours and will request a mammogram at that visit.  Also, with continued smoking, I talked to her about consideration of a low-dose CT of the chest for lung cancer screening but I will defer this to primary care as I am not sure specifically when that is indicated.     Medication Adjustments/Labs and Tests Ordered: Current medicines are reviewed at length with the patient today.  Concerns regarding medicines are outlined above.  Orders Placed This Encounter  Procedures   AMB Referral to [facility] Pharm-D   EKG 12-Lead   ECHOCARDIOGRAM COMPLETE   VAS US CAROTID   Meds ordered this encounter  Medications   hydrochlorothiazide (HYDRODIURIL) 25 MG tablet  Sig: Take 1 tablet (25 mg total) by mouth daily.    Dispense:  90 tablet    Refill:  3   labetalol (NORMODYNE) 300 MG tablet    Sig: Take 1 tablet (300 mg  total) by mouth 2 (two) times daily.    Dispense:  180 tablet    Refill:  1    Patient Instructions  Medication Instructions:  Your physician has recommended you make the following change in your medication:  Stop taking hydralazine. Start taking labetalol '300mg'$  2 times daily. Start taking hydrochlorothiazide '25mg'$  daily.  *If you need a refill on your cardiac medications before your next appointment, please call your pharmacy*   Testing/Procedures: ECHO Your physician has requested that you have an echocardiogram. Echocardiography is a painless test that uses sound waves to create images of your heart. It provides your doctor with information about the size and shape of your heart and how well your heart's chambers and valves are working. This procedure takes approximately one hour. There are no restrictions for this procedure.  Carotid US Your physician has requested that you have a carotid duplex. This test is an ultrasound of the carotid arteries in your neck. It looks at blood flow through these arteries that supply the brain with blood. Allow one hour for this exam. There are no restrictions or special instructions.    Follow-Up: At [facility], you and your health needs are our priority.  As part of our continuing mission to provide you with exceptional heart care, we have created designated Provider Care Teams.  These Care Teams include your primary Cardiologist (physician) and Advanced Practice Providers (APPs -  Physician Assistants and Nurse Practitioners) who all work together to provide you with the care you need, when you need it.    Your next appointment:   6 month(s)  The format for your next appointment:   In Person  Provider:  APP  Then, Sherren Mocha, MD will plan to see you again in 1 year(s).    Other Instructions          Signed, Sherren Mocha, MD  11/27/2021 5:06 PM    [place]

## 2021-11-27 NOTE — Patient Instructions (Signed)
Medication Instructions:  Your physician has recommended you make the following change in your medication:  Stop taking hydralazine. Start taking labetalol '300mg'$  2 times daily. Start taking hydrochlorothiazide '25mg'$  daily.  *If you need a refill on your cardiac medications before your next appointment, please call your pharmacy*   Testing/Procedures: ECHO Your physician has requested that you have an echocardiogram. Echocardiography is a painless test that uses sound waves to create images of your heart. It provides your doctor with information about the size and shape of your heart and how well your heart's chambers and valves are working. This procedure takes approximately one hour. There are no restrictions for this procedure.  Carotid US Your physician has requested that you have a carotid duplex. This test is an ultrasound of the carotid arteries in your neck. It looks at blood flow through these arteries that supply the brain with blood. Allow one hour for this exam. There are no restrictions or special instructions.    Follow-Up: At [facility], you and your health needs are our priority.  As part of our continuing mission to provide you with exceptional heart care, we have created designated Provider Care Teams.  These Care Teams include your primary Cardiologist (physician) and Advanced Practice Providers (APPs -  Physician Assistants and Nurse Practitioners) who all work together to provide you with the care you need, when you need it.    Your next appointment:   6 month(s)  The format for your next appointment:   In Person  Provider:  APP  Then, Sherren Mocha, MD will plan to see you again in 1 year(s).    Other Instructions

## 2021-11-28 ENCOUNTER — Other Ambulatory Visit: Payer: Self-pay | Admitting: Cardiovascular Disease

## 2021-11-28 ENCOUNTER — Encounter: Payer: Self-pay | Admitting: Internal Medicine

## 2021-11-28 DIAGNOSIS — I6523 Occlusion and stenosis of bilateral carotid arteries: Secondary | ICD-10-CM

## 2021-11-28 NOTE — Progress Notes (Signed)
Subjective:    Patient ID: [name], female    DOB: 05/16/1945, 76 y.o.   MRN: [id number]      HPI [name] is here for a Physical exam.      Medications and allergies reviewed with patient and updated if appropriate.  Current Outpatient Medications on File Prior to Visit  Medication Sig Dispense Refill   amLODipine (NORVASC) 10 MG tablet TAKE 1 TABLET (10 MG TOTAL) BY MOUTH DAILY. FOLLOW UP APPOINTMENT NEEDED 30 tablet 0   aspirin EC 81 MG tablet Take 1 tablet (81 mg total) by mouth daily. 90 tablet 3   atorvastatin (LIPITOR) 20 MG tablet Take 1 tablet (20 mg total) by mouth daily. Please keep upcoming appointment for further refills 30 tablet 0   gabapentin (NEURONTIN) 300 MG capsule TAKE 1 CAPSULE BY MOUTH EVERYDAY AT BEDTIME 90 capsule 1   hydrochlorothiazide (HYDRODIURIL) 25 MG tablet Take 1 tablet (25 mg total) by mouth daily. 90 tablet 3   labetalol (NORMODYNE) 300 MG tablet Take 1 tablet (300 mg total) by mouth 2 (two) times daily. 180 tablet 1   omeprazole (PRILOSEC) 40 MG capsule TAKE 1 CAPSULE BY MOUTH EVERY DAY 90 capsule 0   No current facility-administered medications on file prior to visit.    Review of Systems     Objective:  There were no vitals filed for this visit. There were no vitals filed for this visit. There is no height or weight on file to calculate BMI.  BP Readings from Last 3 Encounters:  11/27/21 (!) 215/96  06/22/20 (!) 160/70  06/21/20 (!) 160/78    Wt Readings from Last 3 Encounters:  11/27/21 185 lb 12.8 oz (84.3 kg)  06/22/20 185 lb (83.9 kg)  06/21/20 185 lb (83.9 kg)       Physical Exam Constitutional: [name] appears well-developed and well-nourished. No distress.  HENT:  Head: Normocephalic and atraumatic.  Right Ear: External ear normal. Normal ear canal and TM Left Ear: External ear normal.  Normal ear canal and TM Mouth/Throat: Oropharynx is clear and moist.  Eyes: Conjunctivae normal.  Neck: Neck supple. No  tracheal deviation present. No thyromegaly present.  No carotid bruit  Cardiovascular: Normal rate, regular rhythm and normal heart sounds.   No murmur heard.  No edema. Pulmonary/Chest: Effort normal and breath sounds normal. No respiratory distress. [name] has no wheezes. [name] has no rales.  Breast: deferred   Abdominal: Soft. [name] exhibits no distension. There is no tenderness.  Lymphadenopathy: [name] has no cervical adenopathy.  Skin: Skin is warm and dry. [name] is not diaphoretic.  Psychiatric: [name] has a normal mood and affect. Her behavior is normal.     Lab Results  Component Value Date   WBC 9.7 06/27/2020   HGB 13.3 06/27/2020   HCT 40.3 06/27/2020   PLT 287 06/27/2020   GLUCOSE 89 06/27/2020   CHOL 181 01/31/2021   TRIG 127 01/31/2021   HDL 64 01/31/2021   LDLDIRECT 141.8 07/31/2012   LDLCALC 95 01/31/2021   ALT 6 01/31/2021   AST 13 01/31/2021   NA 136 06/27/2020   K 4.0 06/27/2020   CL 97 06/27/2020   CREATININE 0.90 06/27/2020   BUN 15 06/27/2020   CO2 20 06/27/2020   TSH 1.64 02/23/2019   HGBA1C 5.9 06/05/2019         Assessment & Plan:   Physical exam: Screening blood work  ordered Exercise   Weight   Substance abuse  none  Reviewed recommended immunizations.   Health Maintenance  Topic Date Due   Pneumonia Vaccine 10+ Years old (1 - PCV) Never done   Zoster Vaccines- Shingrix (1 of 2) Never done   COVID-19 Vaccine (4 - Moderna risk series) 04/21/2020   MAMMOGRAM  12/30/2022   DEXA SCAN  06/12/2023   COLONOSCOPY (Pts 45-56yr Insurance coverage will need to be confirmed)  09/01/2025   Hepatitis C Screening  Completed   HPV VACCINES  Aged Out   INFLUENZA VACCINE  Discontinued   TETANUS/TDAP  Discontinued          See Problem List for Assessment and Plan of chronic medical problems.    This encounter was created in error - please disregard.

## 2021-11-28 NOTE — Patient Instructions (Addendum)
Blood work was ordered.     Medications changes include :      Your prescription(s) have been sent to your pharmacy.    A referral was ordered for XX.     Someone from that office will call you to schedule an appointment.    Return in about 6 months (around 06/01/2022) for follow up.   Health Maintenance, Female Adopting a healthy lifestyle and getting preventive care are important in promoting health and wellness. Ask your health care provider about: The right schedule for you to have regular tests and exams. Things you can do on your own to prevent diseases and keep yourself healthy. What should I know about diet, weight, and exercise? Eat a healthy diet  Eat a diet that includes plenty of vegetables, fruits, low-fat dairy products, and lean protein. Do not eat a lot of foods that are high in solid fats, added sugars, or sodium. Maintain a healthy weight Body mass index (BMI) is used to identify weight problems. It estimates body fat based on height and weight. Your health care provider can help determine your BMI and help you achieve or maintain a healthy weight. Get regular exercise Get regular exercise. This is one of the most important things you can do for your health. Most adults should: Exercise for at least 150 minutes each week. The exercise should increase your heart rate and make you sweat (moderate-intensity exercise). Do strengthening exercises at least twice a week. This is in addition to the moderate-intensity exercise. Spend less time sitting. Even light physical activity can be beneficial. Watch cholesterol and blood lipids Have your blood tested for lipids and cholesterol at 75 years of age, then have this test every 5 years. Have your cholesterol levels checked more often if: Your lipid or cholesterol levels are high. You are older than 74 years of age. You are at high risk for heart disease. What should I know about cancer screening? Depending on  your health history and family history, you may need to have cancer screening at various ages. This may include screening for: Breast cancer. Cervical cancer. Colorectal cancer. Skin cancer. Lung cancer. What should I know about heart disease, diabetes, and high blood pressure? Blood pressure and heart disease High blood pressure causes heart disease and increases the risk of stroke. This is more likely to develop in people who have high blood pressure readings or are overweight. Have your blood pressure checked: Every 3-5 years if you are 29-27 years of age. Every year if you are 67 years old or older. Diabetes Have regular diabetes screenings. This checks your fasting blood sugar level. Have the screening done: Once every three years after age 28 if you are at a normal weight and have a low risk for diabetes. More often and at a younger age if you are overweight or have a high risk for diabetes. What should I know about preventing infection? Hepatitis B If you have a higher risk for hepatitis B, you should be screened for this virus. Talk with your health care provider to find out if you are at risk for hepatitis B infection. Hepatitis C Testing is recommended for: Everyone born from [birth year] through 1963. Anyone with known risk factors for hepatitis C. Sexually transmitted infections (STIs) Get screened for STIs, including gonorrhea and chlamydia, if: You are sexually active and are younger than 74 years of age. You are older than 74 years of age and your health care provider tells you  that you are at risk for this type of infection. Your sexual activity has changed since you were last screened, and you are at increased risk for chlamydia or gonorrhea. Ask your health care provider if you are at risk. Ask your health care provider about whether you are at high risk for HIV. Your health care provider may recommend a prescription medicine to help prevent HIV infection. If you choose to take  medicine to prevent HIV, you should first get tested for HIV. You should then be tested every 3 months for as long as you are taking the medicine. Pregnancy If you are about to stop having your period (premenopausal) and you may become pregnant, seek counseling before you get pregnant. Take 400 to 800 micrograms (mcg) of folic acid every day if you become pregnant. Ask for birth control (contraception) if you want to prevent pregnancy. Osteoporosis and menopause Osteoporosis is a disease in which the bones lose minerals and strength with aging. This can result in bone fractures. If you are 15 years old or older, or if you are at risk for osteoporosis and fractures, ask your health care provider if you should: Be screened for bone loss. Take a calcium or vitamin D supplement to lower your risk of fractures. Be given hormone replacement therapy (HRT) to treat symptoms of menopause. Follow these instructions at home: Alcohol use Do not drink alcohol if: Your health care provider tells you not to drink. You are pregnant, may be pregnant, or are planning to become pregnant. If you drink alcohol: Limit how much you have to: 0-1 drink a day. Know how much alcohol is in your drink. In the [place] one drink equals one 12 oz bottle of beer (355 mL), one 5 oz glass of wine (148 mL), or one 1 oz glass of hard liquor (44 mL). Lifestyle Do not use any products that contain nicotine or tobacco. These products include cigarettes, chewing tobacco, and vaping devices, such as e-cigarettes. If you need help quitting, ask your health care provider. Do not use street drugs. Do not share needles. Ask your health care provider for help if you need support or information about quitting drugs. General instructions Schedule regular health, dental, and eye exams. Stay current with your vaccines. Tell your health care provider if: You often feel depressed. You have ever been abused or do not feel safe at  home. Summary Adopting a healthy lifestyle and getting preventive care are important in promoting health and wellness. Follow your health care provider's instructions about healthy diet, exercising, and getting tested or screened for diseases. Follow your health care provider's instructions on monitoring your cholesterol and blood pressure. This information is not intended to replace advice given to you by your health care provider. Make sure you discuss any questions you have with your health care provider. Document Revised: 08/15/2020 Document Reviewed: 08/15/2020 Elsevier Patient Education  [place].

## 2021-11-29 ENCOUNTER — Encounter: Payer: Medicare (Managed Care) | Admitting: Internal Medicine

## 2021-11-29 DIAGNOSIS — Z853 Personal history of malignant neoplasm of breast: Secondary | ICD-10-CM

## 2021-11-29 DIAGNOSIS — G629 Polyneuropathy, unspecified: Secondary | ICD-10-CM

## 2021-11-29 DIAGNOSIS — K219 Gastro-esophageal reflux disease without esophagitis: Secondary | ICD-10-CM

## 2021-11-29 DIAGNOSIS — I10 Essential (primary) hypertension: Secondary | ICD-10-CM

## 2021-11-29 DIAGNOSIS — E785 Hyperlipidemia, unspecified: Secondary | ICD-10-CM

## 2021-11-29 DIAGNOSIS — M85839 Other specified disorders of bone density and structure, unspecified forearm: Secondary | ICD-10-CM

## 2021-11-29 DIAGNOSIS — R7303 Prediabetes: Secondary | ICD-10-CM

## 2021-11-29 DIAGNOSIS — Z0000 Encounter for general adult medical examination without abnormal findings: Secondary | ICD-10-CM

## 2021-11-30 ENCOUNTER — Other Ambulatory Visit: Payer: Self-pay | Admitting: Internal Medicine

## 2021-12-04 ENCOUNTER — Encounter: Payer: Self-pay | Admitting: Internal Medicine

## 2021-12-04 DIAGNOSIS — E538 Deficiency of other specified B group vitamins: Secondary | ICD-10-CM | POA: Insufficient documentation

## 2021-12-04 DIAGNOSIS — E559 Vitamin D deficiency, unspecified: Secondary | ICD-10-CM | POA: Insufficient documentation

## 2021-12-04 NOTE — Progress Notes (Unsigned)
Subjective:    Patient ID: [name], female    DOB: 03/10/1946, 76 y.o.   MRN: [id number]      HPI [name] is here for a Physical exam.   Saw cardio recently and BP was elevated.  She was not taking some of her meds - to see pharmacist in cardio office this week.   Breast lump, right breast  -    Medications and allergies reviewed with patient and updated if appropriate.  Current Outpatient Medications on File Prior to Visit  Medication Sig Dispense Refill   amLODipine (NORVASC) 10 MG tablet TAKE 1 TABLET (10 MG TOTAL) BY MOUTH DAILY. FOLLOW UP APPOINTMENT NEEDED 30 tablet 0   aspirin EC 81 MG tablet Take 1 tablet (81 mg total) by mouth daily. 90 tablet 3   atorvastatin (LIPITOR) 20 MG tablet Take 1 tablet (20 mg total) by mouth daily. Please keep upcoming appointment for further refills 30 tablet 0   gabapentin (NEURONTIN) 300 MG capsule TAKE 1 CAPSULE BY MOUTH EVERYDAY AT BEDTIME 90 capsule 1   hydrochlorothiazide (HYDRODIURIL) 25 MG tablet Take 1 tablet (25 mg total) by mouth daily. 90 tablet 3   labetalol (NORMODYNE) 300 MG tablet Take 1 tablet (300 mg total) by mouth 2 (two) times daily. 180 tablet 1   omeprazole (PRILOSEC) 40 MG capsule TAKE 1 CAPSULE BY MOUTH EVERY DAY 90 capsule 0   No current facility-administered medications on file prior to visit.    Review of Systems     Objective:  There were no vitals filed for this visit. There were no vitals filed for this visit. There is no height or weight on file to calculate BMI.  BP Readings from Last 3 Encounters:  11/27/21 (!) 215/96  06/22/20 (!) 160/70  06/21/20 (!) 160/78    Wt Readings from Last 3 Encounters:  11/27/21 185 lb 12.8 oz (84.3 kg)  06/22/20 185 lb (83.9 kg)  06/21/20 185 lb (83.9 kg)       Physical Exam Constitutional: She appears well-developed and well-nourished. No distress.  HENT:  Head: Normocephalic and atraumatic.  Right Ear: External ear normal. Normal ear canal and  TM Left Ear: External ear normal.  Normal ear canal and TM Mouth/Throat: Oropharynx is clear and moist.  Eyes: Conjunctivae normal.  Neck: Neck supple. No tracheal deviation present. No thyromegaly present.  No carotid bruit  Cardiovascular: Normal rate, regular rhythm and normal heart sounds.   No murmur heard.  No edema. Pulmonary/Chest: Effort normal and breath sounds normal. No respiratory distress. She has no wheezes. She has no rales.  Breast: deferred   Abdominal: Soft. She exhibits no distension. There is no tenderness.  Lymphadenopathy: She has no cervical adenopathy.  Skin: Skin is warm and dry. She is not diaphoretic.  Psychiatric: She has a normal mood and affect. Her behavior is normal.     Lab Results  Component Value Date   WBC 9.7 06/27/2020   HGB 13.3 06/27/2020   HCT 40.3 06/27/2020   PLT 287 06/27/2020   GLUCOSE 89 06/27/2020   CHOL 181 01/31/2021   TRIG 127 01/31/2021   HDL 64 01/31/2021   LDLDIRECT 141.8 07/31/2012   LDLCALC 95 01/31/2021   ALT 6 01/31/2021   AST 13 01/31/2021   NA 136 06/27/2020   K 4.0 06/27/2020   CL 97 06/27/2020   CREATININE 0.90 06/27/2020   BUN 15 06/27/2020   CO2 20 06/27/2020   TSH 1.64 02/23/2019   HGBA1C  5.9 06/05/2019         Assessment & Plan:   Physical exam: Screening blood work  ordered Exercise   Weight   Substance abuse  none   Reviewed recommended immunizations.   Health Maintenance  Topic Date Due   Pneumonia Vaccine 78+ Years old (1 - PCV) Never done   Zoster Vaccines- Shingrix (1 of 2) Never done   COVID-19 Vaccine (4 - Moderna risk series) 04/21/2020   MAMMOGRAM  12/30/2022   DEXA SCAN  06/12/2023   COLONOSCOPY (Pts 45-74yr Insurance coverage will need to be confirmed)  09/01/2025   Hepatitis C Screening  Completed   HPV VACCINES  Aged Out   INFLUENZA VACCINE  Discontinued   TETANUS/TDAP  Discontinued          See Problem List for Assessment and Plan of chronic medical  problems.

## 2021-12-04 NOTE — Patient Instructions (Signed)
Blood work was ordered.     Medications changes include :  none    Your prescription(s) have been sent to your pharmacy.    A diagnostic mammogram and ultrasound was ordered.    Return in about 6 months (around 06/07/2022) for follow up.   Health Maintenance, Female Adopting a healthy lifestyle and getting preventive care are important in promoting health and wellness. Ask your health care provider about: The right schedule for you to have regular tests and exams. Things you can do on your own to prevent diseases and keep yourself healthy. What should I know about diet, weight, and exercise? Eat a healthy diet  Eat a diet that includes plenty of vegetables, fruits, low-fat dairy products, and lean protein. Do not eat a lot of foods that are high in solid fats, added sugars, or sodium. Maintain a healthy weight Body mass index (BMI) is used to identify weight problems. It estimates body fat based on height and weight. Your health care provider can help determine your BMI and help you achieve or maintain a healthy weight. Get regular exercise Get regular exercise. This is one of the most important things you can do for your health. Most adults should: Exercise for at least 150 minutes each week. The exercise should increase your heart rate and make you sweat (moderate-intensity exercise). Do strengthening exercises at least twice a week. This is in addition to the moderate-intensity exercise. Spend less time sitting. Even light physical activity can be beneficial. Watch cholesterol and blood lipids Have your blood tested for lipids and cholesterol at 74 years of age, then have this test every 5 years. Have your cholesterol levels checked more often if: Your lipid or cholesterol levels are high. You are older than 74 years of age. You are at high risk for heart disease. What should I know about cancer screening? Depending on your health history and family history, you may  need to have cancer screening at various ages. This may include screening for: Breast cancer. Cervical cancer. Colorectal cancer. Skin cancer. Lung cancer. What should I know about heart disease, diabetes, and high blood pressure? Blood pressure and heart disease High blood pressure causes heart disease and increases the risk of stroke. This is more likely to develop in people who have high blood pressure readings or are overweight. Have your blood pressure checked: Every 3-5 years if you are 77-65 years of age. Every year if you are 56 years old or older. Diabetes Have regular diabetes screenings. This checks your fasting blood sugar level. Have the screening done: Once every three years after age 25 if you are at a normal weight and have a low risk for diabetes. More often and at a younger age if you are overweight or have a high risk for diabetes. What should I know about preventing infection? Hepatitis B If you have a higher risk for hepatitis B, you should be screened for this virus. Talk with your health care provider to find out if you are at risk for hepatitis B infection. Hepatitis C Testing is recommended for: Everyone born from [birth year] through 1963. Anyone with known risk factors for hepatitis C. Sexually transmitted infections (STIs) Get screened for STIs, including gonorrhea and chlamydia, if: You are sexually active and are younger than 74 years of age. You are older than 74 years of age and your health care provider tells you that you are at risk for this type of infection. Your sexual activity has  changed since you were last screened, and you are at increased risk for chlamydia or gonorrhea. Ask your health care provider if you are at risk. Ask your health care provider about whether you are at high risk for HIV. Your health care provider may recommend a prescription medicine to help prevent HIV infection. If you choose to take medicine to prevent HIV, you should first get  tested for HIV. You should then be tested every 3 months for as long as you are taking the medicine. Pregnancy If you are about to stop having your period (premenopausal) and you may become pregnant, seek counseling before you get pregnant. Take 400 to 800 micrograms (mcg) of folic acid every day if you become pregnant. Ask for birth control (contraception) if you want to prevent pregnancy. Osteoporosis and menopause Osteoporosis is a disease in which the bones lose minerals and strength with aging. This can result in bone fractures. If you are 80 years old or older, or if you are at risk for osteoporosis and fractures, ask your health care provider if you should: Be screened for bone loss. Take a calcium or vitamin D supplement to lower your risk of fractures. Be given hormone replacement therapy (HRT) to treat symptoms of menopause. Follow these instructions at home: Alcohol use Do not drink alcohol if: Your health care provider tells you not to drink. You are pregnant, may be pregnant, or are planning to become pregnant. If you drink alcohol: Limit how much you have to: 0-1 drink a day. Know how much alcohol is in your drink. In the [place] one drink equals one 12 oz bottle of beer (355 mL), one 5 oz glass of wine (148 mL), or one 1 oz glass of hard liquor (44 mL). Lifestyle Do not use any products that contain nicotine or tobacco. These products include cigarettes, chewing tobacco, and vaping devices, such as e-cigarettes. If you need help quitting, ask your health care provider. Do not use street drugs. Do not share needles. Ask your health care provider for help if you need support or information about quitting drugs. General instructions Schedule regular health, dental, and eye exams. Stay current with your vaccines. Tell your health care provider if: You often feel depressed. You have ever been abused or do not feel safe at home. Summary Adopting a healthy lifestyle and getting  preventive care are important in promoting health and wellness. Follow your health care provider's instructions about healthy diet, exercising, and getting tested or screened for diseases. Follow your health care provider's instructions on monitoring your cholesterol and blood pressure. This information is not intended to replace advice given to you by your health care provider. Make sure you discuss any questions you have with your health care provider. Document Revised: 08/15/2020 Document Reviewed: 08/15/2020 Elsevier Patient Education  [place].

## 2021-12-05 ENCOUNTER — Ambulatory Visit (INDEPENDENT_AMBULATORY_CARE_PROVIDER_SITE_OTHER): Payer: Medicare (Managed Care) | Admitting: Internal Medicine

## 2021-12-05 VITALS — BP 140/68 | HR 52 | Temp 98.0°F | Ht 64.0 in | Wt 175.0 lb

## 2021-12-05 DIAGNOSIS — E538 Deficiency of other specified B group vitamins: Secondary | ICD-10-CM | POA: Diagnosis not present

## 2021-12-05 DIAGNOSIS — E559 Vitamin D deficiency, unspecified: Secondary | ICD-10-CM

## 2021-12-05 DIAGNOSIS — G629 Polyneuropathy, unspecified: Secondary | ICD-10-CM

## 2021-12-05 DIAGNOSIS — R7303 Prediabetes: Secondary | ICD-10-CM

## 2021-12-05 DIAGNOSIS — I10 Essential (primary) hypertension: Secondary | ICD-10-CM | POA: Diagnosis not present

## 2021-12-05 DIAGNOSIS — Z0001 Encounter for general adult medical examination with abnormal findings: Secondary | ICD-10-CM

## 2021-12-05 DIAGNOSIS — E782 Mixed hyperlipidemia: Secondary | ICD-10-CM | POA: Diagnosis not present

## 2021-12-05 DIAGNOSIS — Z0000 Encounter for general adult medical examination without abnormal findings: Secondary | ICD-10-CM

## 2021-12-05 DIAGNOSIS — K219 Gastro-esophageal reflux disease without esophagitis: Secondary | ICD-10-CM

## 2021-12-05 DIAGNOSIS — N6311 Unspecified lump in the right breast, upper outer quadrant: Secondary | ICD-10-CM | POA: Diagnosis not present

## 2021-12-05 DIAGNOSIS — M85839 Other specified disorders of bone density and structure, unspecified forearm: Secondary | ICD-10-CM

## 2021-12-05 LAB — COMPREHENSIVE METABOLIC PANEL
ALT: 14 U/L (ref 0–35)
AST: 12 U/L (ref 0–37)
Albumin: 3.9 g/dL (ref 3.5–5.2)
Alkaline Phosphatase: 79 U/L (ref 39–117)
BUN: 24 mg/dL — ABNORMAL HIGH (ref 6–23)
CO2: 24 mEq/L (ref 19–32)
Calcium: 9.1 mg/dL (ref 8.4–10.5)
Chloride: 105 mEq/L (ref 96–112)
Creatinine, Ser: 1.22 mg/dL — ABNORMAL HIGH (ref 0.40–1.20)
GFR: 43.72 mL/min — ABNORMAL LOW (ref >60.00)
Glucose, Bld: 103 mg/dL — ABNORMAL HIGH (ref 70–99)
Potassium: 3.6 mEq/L (ref 3.5–5.1)
Sodium: 142 mEq/L (ref 135–145)
Total Bilirubin: 0.6 mg/dL (ref 0.2–1.2)
Total Protein: 7.4 g/dL (ref 6.0–8.3)

## 2021-12-05 LAB — CBC WITH DIFFERENTIAL/PLATELET
Basophils Absolute: 0 10*3/uL (ref 0.0–0.1)
Basophils Relative: 0.4 % (ref 0.0–3.0)
Eosinophils Absolute: 0.1 10*3/uL (ref 0.0–0.7)
Eosinophils Relative: 1.4 % (ref 0.0–5.0)
HCT: 37 % (ref 36.0–46.0)
Hemoglobin: 12 g/dL (ref 12.0–15.0)
Lymphocytes Relative: 41.2 % (ref 12.0–46.0)
Lymphs Abs: 3.1 10*3/uL (ref 0.7–4.0)
MCHC: 32.4 g/dL (ref 30.0–36.0)
MCV: 85.3 fl (ref 78.0–100.0)
Monocytes Absolute: 0.4 10*3/uL (ref 0.1–1.0)
Monocytes Relative: 5.9 % (ref 3.0–12.0)
Neutro Abs: 3.9 10*3/uL (ref 1.4–7.7)
Neutrophils Relative %: 51.1 % (ref 43.0–77.0)
Platelets: 242 10*3/uL (ref 150.0–400.0)
RBC: 4.34 Mil/uL (ref 3.87–5.11)
RDW: 14.5 % (ref 11.5–15.5)
WBC: 7.6 10*3/uL (ref 4.0–10.5)

## 2021-12-05 LAB — VITAMIN D 25 HYDROXY (VIT D DEFICIENCY, FRACTURES): VITD: 9.44 ng/mL — ABNORMAL LOW (ref 30.00–100.00)

## 2021-12-05 LAB — LIPID PANEL
Cholesterol: 157 mg/dL (ref 0–200)
HDL: 50.7 mg/dL (ref >39.00)
LDL Cholesterol: 86 mg/dL (ref 0–99)
NonHDL: 106.64
Total CHOL/HDL Ratio: 3
Triglycerides: 104 mg/dL (ref 0.0–149.0)
VLDL: 20.8 mg/dL (ref 0.0–40.0)

## 2021-12-05 LAB — VITAMIN B12: Vitamin B-12: 162 pg/mL — ABNORMAL LOW (ref 211–911)

## 2021-12-05 LAB — HEMOGLOBIN A1C: Hgb A1c MFr Bld: 6 % (ref 4.6–6.5)

## 2021-12-05 LAB — TSH: TSH: 1.31 u[IU]/mL (ref 0.35–5.50)

## 2021-12-05 MED ORDER — AMLODIPINE BESYLATE 10 MG PO TABS: 10.0000 mg | ORAL_TABLET | Freq: Every day | ORAL | 2 refills | Status: DC

## 2021-12-05 MED ORDER — GABAPENTIN 300 MG PO CAPS: ORAL_CAPSULE | ORAL | 1 refills | Status: DC

## 2021-12-05 NOTE — Assessment & Plan Note (Signed)
Chronic Check vitamin D level 

## 2021-12-05 NOTE — Assessment & Plan Note (Signed)
Chronic °Regular exercise and healthy diet encouraged °Check lipid panel  °Continue atorvastatin 20 mg daily °

## 2021-12-05 NOTE — Assessment & Plan Note (Addendum)
Chronic Blood pressure not controlled-saw cardiology last week and will be seen the pharmacist in the cardiology office this week CMP Continue amlodipine 10 mg daily, HCTZ 25 mg daily, labetalol 300 mg twice daily-stressed and not forgetting to take them Encouraged to monitor blood pressure at home

## 2021-12-05 NOTE — Assessment & Plan Note (Signed)
Chronic ?Check B12 level ?

## 2021-12-05 NOTE — Assessment & Plan Note (Signed)
New Noticed this about 2 weeks ago-palpable lump at 11:00 Diagnostic mammogram and ultrasound ordered

## 2021-12-05 NOTE — Assessment & Plan Note (Addendum)
Chronic At the site of her breast surgery Continue gabapentin 300 mg daily

## 2021-12-05 NOTE — Assessment & Plan Note (Signed)
Chronic Check a1c Low sugar / carb diet Stressed regular exercise  

## 2021-12-05 NOTE — Assessment & Plan Note (Signed)
Chronic GERD controlled Continue omeprazole 40 mg daily 

## 2021-12-05 NOTE — Assessment & Plan Note (Addendum)
Chronic DEXA up-to-date Encouraged regular exercise Encouraged taking calcium 600 mg twice daily and vitamin D 2000 units daily

## 2021-12-06 ENCOUNTER — Ambulatory Visit (HOSPITAL_COMMUNITY)
Admission: RE | Admit: 2021-12-06 | Discharge: 2021-12-06 | Disposition: A | Discharge status: Home / Self Care | Payer: Medicare (Managed Care) | Source: Ambulatory Visit | Attending: Internal Medicine | Admitting: Internal Medicine

## 2021-12-06 DIAGNOSIS — I6523 Occlusion and stenosis of bilateral carotid arteries: Secondary | ICD-10-CM | POA: Diagnosis present

## 2021-12-07 ENCOUNTER — Ambulatory Visit (HOSPITAL_COMMUNITY): Payer: Medicare (Managed Care)

## 2021-12-08 MED ORDER — VITAMIN D (ERGOCALCIFEROL) 1.25 MG (50000 UNIT) PO CAPS: 50000.0000 [IU] | ORAL_CAPSULE | ORAL | 0 refills | Status: DC

## 2021-12-08 NOTE — Addendum Note (Signed)
Addended by: Binnie Rail on: 12/08/2021 08:07 AM   Modules accepted: Orders

## 2021-12-13 ENCOUNTER — Other Ambulatory Visit: Payer: Self-pay | Admitting: Internal Medicine

## 2021-12-13 ENCOUNTER — Ambulatory Visit
Admission: RE | Admit: 2021-12-13 | Discharge: 2021-12-13 | Disposition: A | Discharge status: Home / Self Care | Payer: Medicare (Managed Care) | Source: Ambulatory Visit | Attending: Internal Medicine | Admitting: Internal Medicine

## 2021-12-13 DIAGNOSIS — N6311 Unspecified lump in the right breast, upper outer quadrant: Secondary | ICD-10-CM

## 2021-12-18 ENCOUNTER — Ambulatory Visit (HOSPITAL_COMMUNITY): Payer: Medicare (Managed Care) | Attending: Cardiovascular Disease

## 2021-12-18 DIAGNOSIS — I6523 Occlusion and stenosis of bilateral carotid arteries: Secondary | ICD-10-CM | POA: Insufficient documentation

## 2021-12-18 DIAGNOSIS — E782 Mixed hyperlipidemia: Secondary | ICD-10-CM | POA: Insufficient documentation

## 2021-12-18 DIAGNOSIS — Z720 Tobacco use: Secondary | ICD-10-CM | POA: Diagnosis present

## 2021-12-18 DIAGNOSIS — I10 Essential (primary) hypertension: Secondary | ICD-10-CM | POA: Insufficient documentation

## 2021-12-18 LAB — ECHOCARDIOGRAM COMPLETE
Area-P 1/2: 3.89 cm2
S' Lateral: 2.9 cm

## 2021-12-19 ENCOUNTER — Other Ambulatory Visit: Payer: Self-pay | Admitting: Cardiovascular Disease

## 2021-12-27 ENCOUNTER — Ambulatory Visit: Payer: Medicare (Managed Care)

## 2022-02-01 ENCOUNTER — Ambulatory Visit: Payer: Medicare (Managed Care) | Admitting: Student

## 2022-02-01 NOTE — Progress Notes (Deleted)
Patient ID: [name]                 DOB: [birth year]                      MRN: [id number]      HPI: [name] is a 76 y.o. female referred by Dr. Burt Knack to HTN clinic. PMH is significant for hypertension, hyperlipidemia, and carotid atherosclerosis without significant stenosis. Patient seen by Dr. Burt Knack 11/27/21. Her blood pressure was significantly elevated. She had run out of several of her blood pressure medications and was not taking hydralazine, hydrochlorothiazide, or labetalol. Her labetalol and HCTZ were resumed and patient was asked to follow up with HTN clinic. When she saw her PCP a few days later, BP was 140/68. Was supposed to see [place] in Sept, but appointment was rescheduled.   She wants to quit smoking.  She is not able to afford [name].  She has been trying to cut down on how much she smokes. Home BP? smoking  Current HTN meds: amlodipine '10mg'$  daily, HCTZ '25mg'$  daily, labetalol '300mg'$  twice a day Previously tried: hydralazine BP goal: <130/80  Family History: The patient's family history includes Breast cancer in her sister; Hypertension in an other family member. There is no history of Heart attack, Stroke, Diabetes, or Heart disease  Social History:   Diet:   Exercise:   Home BP readings:   Wt Readings from Last 3 Encounters:  12/05/21 175 lb (79.4 kg)  11/27/21 185 lb 12.8 oz (84.3 kg)  06/22/20 185 lb (83.9 kg)   BP Readings from Last 3 Encounters:  12/05/21 (!) 140/68  11/27/21 (!) 215/96  06/22/20 (!) 160/70   Pulse Readings from Last 3 Encounters:  12/05/21 (!) 52  11/27/21 (!) 57  06/22/20 63    Renal function: CrCl cannot be calculated (Patient's most recent lab result is older than the maximum 21 days allowed.).  Past Medical History:  Diagnosis Date   Absence of menstruation    amenorrhea   Angioneurotic edema not elsewhere classified    due to ACE-I   Breast cancer ([place])    Breast disorder    breast cancer 2007   Cancer [facility]) 2007    Left Breast Cancer   Carotid stenosis    Carotid US (8/15):  RICA 81-01%; LICA 75-10% >>> FU 1 year   H/O: rheumatic fever    History of breast cancer 07/13/2015   HLD (hyperlipidemia)    Occlusion and stenosis of carotid artery without mention of cerebral infarction    Other abnormal glucose    fasting hyperglycemia   Personal history of malignant neoplasm of breast    Personal history of radiation therapy 2007   Unspecified essential hypertension     Current Outpatient Medications on File Prior to Visit  Medication Sig Dispense Refill   amLODipine (NORVASC) 10 MG tablet Take 1 tablet (10 mg total) by mouth daily. 90 tablet 2   aspirin EC 81 MG tablet Take 1 tablet (81 mg total) by mouth daily. 90 tablet 3   atorvastatin (LIPITOR) 20 MG tablet Take 1 tablet (20 mg total) by mouth daily. 90 tablet 3   gabapentin (NEURONTIN) 300 MG capsule TAKE 1 CAPSULE BY MOUTH EVERYDAY AT BEDTIME 90 capsule 1   hydrochlorothiazide (HYDRODIURIL) 25 MG tablet Take 1 tablet (25 mg total) by mouth daily. 90 tablet 3   labetalol (NORMODYNE) 300 MG tablet Take 1 tablet (300 mg total) by mouth 2 (  two) times daily. 180 tablet 1   omeprazole (PRILOSEC) 40 MG capsule TAKE 1 CAPSULE BY MOUTH EVERY DAY 90 capsule 0   Vitamin D, Ergocalciferol, (DRISDOL) 1.25 MG (50000 UNIT) CAPS capsule Take 1 capsule (50,000 Units total) by mouth every 7 (seven) days. 8 capsule 0   No current facility-administered medications on file prior to visit.    Allergies  Allergen Reactions   Benazepril Hcl     REACTION: FACE SWELLING (ANGIOEDEMA); she can not take ARBS  !!! Because of a history of documented adverse serious drug reaction;Medi Alert bracelet  is recommended   Peanut-Containing Drug Products     03/13/13 hivesw/o facial swelling   Shellfish Allergy     03/15/13 hives & facial swelling   Yellow Dyes (Non-Tartrazine)     12/14 hives  & facial swelling with Christene Lye Aid    There were no vitals taken for this  visit.   Assessment/Plan:  No BP recorded.  {Refresh Note OR Click here to enter BP  :1}***   1. Hypertension -   No problem-specific Assessment & Plan notes found for this encounter.  '@MTPCOMPLETEDLIST'$ @   Thank you  Ramond Dial, Pharm.D, BCPS, CPP [facility] [address], [place], [place] [address]  Phone: [phone]; Fax: [phone]

## 2022-02-12 NOTE — Progress Notes (Unsigned)
Patient ID: [name]                 DOB: [birth year]                      MRN: [id number]      HPI: [name] is a 76 y.o. female referred by Dr. Burt [name] to HTN clinic. PMH is significant for HTN, HLD, smoking, and carotid artery occlusion. At last visit with Dr [name], BP was very elevated but patient had been out of medications.  Patient presents today in good spirits. Works with [employer] . Today is taking a car to [place] for [employer]. Has been more compliant with medications.  Recently saw Dr [name] who started Vitamin D and ordered a mammogram.  Does not check her blood pressure at home. Sister will sometimes check it for her but she does not remember any readings. Was previously on hydralazine as well but this was held pending todays appointment.  Patient recently had [facility] medications on automatic refill so she does not forget to pick them up again.  Current HTN meds:  Amlodipine '10mg'$  daily HCTZ '25mg'$  daily Labetalol '300mg'$  BID  Current HLD meds: Atorvastatin '20mg'$  daily  BP goal: <130/80   Wt Readings from Last 3 Encounters:  12/05/21 175 lb (79.4 kg)  11/27/21 185 lb 12.8 oz (84.3 kg)  06/22/20 185 lb (83.9 kg)   BP Readings from Last 3 Encounters:  12/05/21 (!) 140/68  11/27/21 (!) 215/96  06/22/20 (!) 160/70   Pulse Readings from Last 3 Encounters:  12/05/21 (!) 52  11/27/21 (!) 57  06/22/20 63    Renal function: CrCl cannot be calculated (Patient's most recent lab result is older than the maximum 21 days allowed.).  Past Medical History:  Diagnosis Date   Absence of menstruation    amenorrhea   Angioneurotic edema not elsewhere classified    due to ACE-I   Breast cancer ([place])    Breast disorder    breast cancer 2007   Cancer [facility]) 2007   Left Breast Cancer   Carotid stenosis    Carotid US (8/15):  RICA 74-12%; LICA 87-86% >>> FU 1 year   H/O: rheumatic fever    History of breast cancer 07/13/2015   HLD (hyperlipidemia)     Occlusion and stenosis of carotid artery without mention of cerebral infarction    Other abnormal glucose    fasting hyperglycemia   Personal history of malignant neoplasm of breast    Personal history of radiation therapy 2007   Unspecified essential hypertension     Current Outpatient Medications on File Prior to Visit  Medication Sig Dispense Refill   amLODipine (NORVASC) 10 MG tablet Take 1 tablet (10 mg total) by mouth daily. 90 tablet 2   aspirin EC 81 MG tablet Take 1 tablet (81 mg total) by mouth daily. 90 tablet 3   atorvastatin (LIPITOR) 20 MG tablet Take 1 tablet (20 mg total) by mouth daily. 90 tablet 3   gabapentin (NEURONTIN) 300 MG capsule TAKE 1 CAPSULE BY MOUTH EVERYDAY AT BEDTIME 90 capsule 1   hydrochlorothiazide (HYDRODIURIL) 25 MG tablet Take 1 tablet (25 mg total) by mouth daily. 90 tablet 3   labetalol (NORMODYNE) 300 MG tablet Take 1 tablet (300 mg total) by mouth 2 (two) times daily. 180 tablet 1   omeprazole (PRILOSEC) 40 MG capsule TAKE 1 CAPSULE BY MOUTH EVERY DAY 90 capsule 0   Vitamin D, Ergocalciferol, (DRISDOL)  1.25 MG (50000 UNIT) CAPS capsule Take 1 capsule (50,000 Units total) by mouth every 7 (seven) days. 8 capsule 0   No current facility-administered medications on file prior to visit.    Allergies  Allergen Reactions   Benazepril Hcl     REACTION: FACE SWELLING (ANGIOEDEMA); she can not take ARBS  !!! Because of a history of documented adverse serious drug reaction;Medi Alert bracelet  is recommended   Peanut-Containing Drug Products     03/13/13 hivesw/o facial swelling   Shellfish Allergy     03/15/13 hives & facial swelling   Yellow Dyes (Non-Tartrazine)     12/14 hives  & facial swelling with Christene Lye Aid    Assessment/Plan:  1. Hypertension - Patient BP today 128/58 which is at goal of <130/80.  Patient pleased with results. Will continue current regimen, do not need to add back hydralazine at this time.  Continue: Amlodipine '10mg'$   daily HCTZ '25mg'$  daily Labetalol '300mg'$  BID  2. Hyperlipidemia - Patient recent LDL 86 which is above goal of <70 due to carotid artery occlusion. Patient tolerating atorvastatin well. Will increase to '40mg'$  once daily. Patient voiced understanding.  Increase atorvastatin to '40mg'$  daily   Karren Cobble, PharmD, BCACP, [place], [place] [address], [place], [place] [address] Phone: [phone]; Fax: [phone] 02/14/2022 8:31 AM

## 2022-02-14 ENCOUNTER — Ambulatory Visit: Payer: Medicare (Managed Care) | Attending: Cardiology | Admitting: Pharmacist

## 2022-02-14 ENCOUNTER — Encounter: Payer: Self-pay | Admitting: Pharmacist

## 2022-02-14 VITALS — BP 128/58 | HR 52 | Wt 171.0 lb

## 2022-02-14 DIAGNOSIS — I6523 Occlusion and stenosis of bilateral carotid arteries: Secondary | ICD-10-CM | POA: Diagnosis not present

## 2022-02-14 DIAGNOSIS — I10 Essential (primary) hypertension: Secondary | ICD-10-CM | POA: Diagnosis not present

## 2022-02-14 MED ORDER — ATORVASTATIN CALCIUM 40 MG PO TABS: 40.0000 mg | ORAL_TABLET | Freq: Every day | ORAL | 3 refills | Status: DC

## 2022-02-14 NOTE — Patient Instructions (Addendum)
It was nice meeting you today  We would like your blood pressure to stay less than 130/80  Please continue your:  Amlodipine '10mg'$  daily Hydrochlorothiazide '25mg'$  daily Labetalol '300mg'$  twice a day  We would like your LDL (bad cholesterol) to be less than 70  I will increase your atorvastatin to '40mg'$  once a day  Please call with any questions  Karren Cobble, PharmD, BCACP, [place], [place] [address] [place], [place] [address] Phone: [phone]; Fax: [phone] 02/14/2022 8:19 AM

## 2022-03-16 ENCOUNTER — Telehealth: Payer: Self-pay | Admitting: Internal Medicine

## 2022-03-16 NOTE — Telephone Encounter (Signed)
Patient called and said that she had a tooth pulled and she said she is in pain and wanted to know if Dr Quay Burow could call something in. I let her know she might want to call the office that prescribed the medication before for her tooth pull. She asked if I can still make sure I send this message back. Call back is [phone]

## 2022-03-19 NOTE — Telephone Encounter (Signed)
Spoke with patient today and she was currently at the dentist office to be seen.

## 2022-04-01 ENCOUNTER — Other Ambulatory Visit: Payer: Self-pay | Admitting: Internal Medicine

## 2022-05-03 NOTE — Progress Notes (Signed)
Subjective:    Patient ID: [name], female    DOB: Apr 23, 1945, 77 y.o.   MRN: [id number]      HPI [name] is here for  Chief Complaint  Patient presents with   Weight Loss    Sudden weight loss     Sudden weight loss - she feels her weight loss has been too quick.  She had to have tooth pulled ( December)- she was not able to eat for 2 months.  Before then she did have a cold.  She does not weigh herself at home - she noticed her pant size was lower than usual.   Active at work - no change.  No regular exercise.  Has stress - cares for mom.  Works 70 + hours a week.  Does not eat much - not much change -- when she gets frustrated she does not eat. Has increased stress caring for mom and cooks how she wants to eat, but often does not want to eat that so does not eat much.   She never has eaten breakfast.  She eats when she is hungry.    Wt Readings from Last 3 Encounters:  05/04/22 167 lb (75.8 kg)  02/14/22 171 lb (77.6 kg)  12/05/21 175 lb (79.4 kg)  06/2020-11/2021   185 lb    Mammogram up-to-date, last colonoscopy 2017-due 2027.   Still smoking.   Medications and allergies reviewed with patient and updated if appropriate.  Current Outpatient Medications on File Prior to Visit  Medication Sig Dispense Refill   amLODipine (NORVASC) 10 MG tablet Take 1 tablet (10 mg total) by mouth daily. 90 tablet 2   aspirin EC 81 MG tablet Take 1 tablet (81 mg total) by mouth daily. 90 tablet 3   atorvastatin (LIPITOR) 40 MG tablet Take 1 tablet (40 mg total) by mouth daily. 90 tablet 3   gabapentin (NEURONTIN) 300 MG capsule TAKE 1 CAPSULE BY MOUTH EVERYDAY AT BEDTIME 90 capsule 1   hydrochlorothiazide (HYDRODIURIL) 25 MG tablet Take 1 tablet (25 mg total) by mouth daily. 90 tablet 3   labetalol (NORMODYNE) 300 MG tablet Take 1 tablet (300 mg total) by mouth 2 (two) times daily. 180 tablet 1   omeprazole (PRILOSEC) 40 MG capsule TAKE 1 CAPSULE BY MOUTH EVERY DAY 90 capsule 0    Vitamin D, Ergocalciferol, (DRISDOL) 1.25 MG (50000 UNIT) CAPS capsule Take 1 capsule (50,000 Units total) by mouth every 7 (seven) days. 8 capsule 0   No current facility-administered medications on file prior to visit.    Review of Systems  Constitutional:  Positive for diaphoresis (only at night). Negative for chills and fever.  Respiratory:  Positive for cough (residual from recent URI - clear). Negative for shortness of breath and wheezing.   Cardiovascular:  Negative for chest pain, palpitations and leg swelling.  Gastrointestinal:  Negative for abdominal pain, blood in stool, constipation and diarrhea.       Gerd controlled  Genitourinary:  Negative for dysuria and hematuria.  Neurological:  Negative for light-headedness and headaches.       Objective:   Vitals:   05/04/22 1327  BP: 138/82  Pulse: (!) 52  Temp: 98.6 F (37 C)  SpO2: 97%   BP Readings from Last 3 Encounters:  05/04/22 138/82  02/14/22 (!) 128/58  12/05/21 (!) 140/68   Wt Readings from Last 3 Encounters:  05/04/22 167 lb (75.8 kg)  02/14/22 171 lb (77.6 kg)  12/05/21 175 lb (79.4  kg)   Body mass index is 28.67 kg/m.    Physical Exam Constitutional:      General: She is not in acute distress.    Appearance: Normal appearance.  HENT:     Head: Normocephalic and atraumatic.  Eyes:     Conjunctiva/sclera: Conjunctivae normal.  Neck:     Vascular: Carotid bruit present.  Cardiovascular:     Rate and Rhythm: Normal rate and regular rhythm.     Heart sounds: Normal heart sounds. No murmur heard. Pulmonary:     Effort: Pulmonary effort is normal. No respiratory distress.     Breath sounds: Normal breath sounds. No wheezing.  Abdominal:     General: There is no distension.     Palpations: Abdomen is soft. There is no mass.     Tenderness: There is no abdominal tenderness. There is no guarding or rebound.  Musculoskeletal:     Cervical back: Neck supple.     Right lower leg: No edema.      Left lower leg: No edema.  Lymphadenopathy:     Cervical: No cervical adenopathy.  Skin:    General: Skin is warm and dry.     Findings: No rash.  Neurological:     Mental Status: She is alert. Mental status is at baseline.  Psychiatric:        Mood and Affect: Mood normal.        Behavior: Behavior normal.            Assessment & Plan:    See Problem List for Assessment and Plan of chronic medical problems.

## 2022-05-04 ENCOUNTER — Ambulatory Visit (INDEPENDENT_AMBULATORY_CARE_PROVIDER_SITE_OTHER): Payer: Medicare (Managed Care)

## 2022-05-04 ENCOUNTER — Ambulatory Visit (INDEPENDENT_AMBULATORY_CARE_PROVIDER_SITE_OTHER): Payer: Medicare (Managed Care) | Admitting: Internal Medicine

## 2022-05-04 ENCOUNTER — Encounter: Payer: Self-pay | Admitting: Internal Medicine

## 2022-05-04 VITALS — BP 138/82 | HR 52 | Temp 98.6°F | Ht 64.0 in | Wt 167.0 lb

## 2022-05-04 DIAGNOSIS — R7303 Prediabetes: Secondary | ICD-10-CM

## 2022-05-04 DIAGNOSIS — R634 Abnormal weight loss: Secondary | ICD-10-CM | POA: Insufficient documentation

## 2022-05-04 DIAGNOSIS — R052 Subacute cough: Secondary | ICD-10-CM

## 2022-05-04 DIAGNOSIS — K219 Gastro-esophageal reflux disease without esophagitis: Secondary | ICD-10-CM

## 2022-05-04 DIAGNOSIS — F17210 Nicotine dependence, cigarettes, uncomplicated: Secondary | ICD-10-CM | POA: Diagnosis not present

## 2022-05-04 DIAGNOSIS — I10 Essential (primary) hypertension: Secondary | ICD-10-CM

## 2022-05-04 LAB — CBC WITH DIFFERENTIAL/PLATELET
Basophils Absolute: 0 10*3/uL (ref 0.0–0.1)
Basophils Relative: 0.3 % (ref 0.0–3.0)
Eosinophils Absolute: 0.2 10*3/uL (ref 0.0–0.7)
Eosinophils Relative: 2.5 % (ref 0.0–5.0)
HCT: 36.5 % (ref 36.0–46.0)
Hemoglobin: 12.1 g/dL (ref 12.0–15.0)
Lymphocytes Relative: 43.4 % (ref 12.0–46.0)
Lymphs Abs: 4.1 10*3/uL — ABNORMAL HIGH (ref 0.7–4.0)
MCHC: 33.1 g/dL (ref 30.0–36.0)
MCV: 85.7 fl (ref 78.0–100.0)
Monocytes Absolute: 0.5 10*3/uL (ref 0.1–1.0)
Monocytes Relative: 5 % (ref 3.0–12.0)
Neutro Abs: 4.6 10*3/uL (ref 1.4–7.7)
Neutrophils Relative %: 48.8 % (ref 43.0–77.0)
Platelets: 279 10*3/uL (ref 150.0–400.0)
RBC: 4.26 Mil/uL (ref 3.87–5.11)
RDW: 14.9 % (ref 11.5–15.5)
WBC: 9.4 10*3/uL (ref 4.0–10.5)

## 2022-05-04 LAB — COMPREHENSIVE METABOLIC PANEL
ALT: 9 U/L (ref 0–35)
AST: 15 U/L (ref 0–37)
Albumin: 4.3 g/dL (ref 3.5–5.2)
Alkaline Phosphatase: 69 U/L (ref 39–117)
BUN: 26 mg/dL — ABNORMAL HIGH (ref 6–23)
CO2: 26 mEq/L (ref 19–32)
Calcium: 9.6 mg/dL (ref 8.4–10.5)
Chloride: 103 mEq/L (ref 96–112)
Creatinine, Ser: 1.31 mg/dL — ABNORMAL HIGH (ref 0.40–1.20)
GFR: 40.02 mL/min — ABNORMAL LOW (ref >60.00)
Glucose, Bld: 90 mg/dL (ref 70–99)
Potassium: 4.1 mEq/L (ref 3.5–5.1)
Sodium: 140 mEq/L (ref 135–145)
Total Bilirubin: 0.6 mg/dL (ref 0.2–1.2)
Total Protein: 7.8 g/dL (ref 6.0–8.3)

## 2022-05-04 LAB — TSH: TSH: 1.23 u[IU]/mL (ref 0.35–5.50)

## 2022-05-04 LAB — HEMOGLOBIN A1C: Hgb A1c MFr Bld: 5.9 % (ref 4.6–6.5)

## 2022-05-04 NOTE — Assessment & Plan Note (Signed)
Acute Weight loss - unintentional Having her dental work and not being able to eat likely contributed ot some of the weight loss ? Other reason for weight loss - has increased stress which is likely contributing Will r/o other reasons Cbc, cmp, tsh, A1c Cxr, lung cancer screening F/u in one month to recheck weight  Will evaluate further if needed

## 2022-05-04 NOTE — Assessment & Plan Note (Signed)
Chronic Check a1c Low sugar / carb diet Stressed regular exercise   

## 2022-05-04 NOTE — Assessment & Plan Note (Signed)
Related to recent URI Improving She does smoke and has had some weight loss Cxr today Refer to pulm for lung cancer screening

## 2022-05-04 NOTE — Patient Instructions (Addendum)
      Blood work was ordered.   A chest xray ordered.      Medications changes include :   none    A referral was ordered for Pulmonary for lung cancer screening.     Someone will call you to schedule an appointment.     Return for schedule lab appt.

## 2022-05-04 NOTE — Assessment & Plan Note (Signed)
Chronic BP reasonable controlled Continue amlodipine 10 mg daily, hct z 25 mg daily, labetalol 300 mg bid

## 2022-05-04 NOTE — Assessment & Plan Note (Signed)
Chronic GERD controlled Continue omeprazole 40 mg daily 

## 2022-05-04 NOTE — Assessment & Plan Note (Signed)
Chronic Stressed smoking cessation Discussed risk of cancer and other health problems smoking can cause She knows she should quit

## 2022-05-31 ENCOUNTER — Other Ambulatory Visit: Payer: Self-pay | Admitting: Cardiovascular Disease

## 2022-06-06 ENCOUNTER — Encounter: Payer: Self-pay | Admitting: Internal Medicine

## 2022-06-06 NOTE — Patient Instructions (Addendum)
       Medications changes include :       A referral was ordered for XXX.     Someone will call you to schedule an appointment.    Return in about 6 months (around 12/06/2022) for Physical Exam.

## 2022-06-06 NOTE — Progress Notes (Signed)
      Subjective:    Patient ID: [name], female    DOB: Nov 19, 1945, 77 y.o.   MRN: [id number]     HPI Lottie is here for follow up of her chronic medical problems, including htn, hld, prediabetes, B12 def, vit d def, neuropathy at site of breast surgery, gerd, osteopenia and weight loss  - seen one month ago    No labs-just had them --- stress B12, D supp  Advised taking calcium vitamin D Still taking gabapentin-working  Medications and allergies reviewed with patient and updated if appropriate.  Current Outpatient Medications on File Prior to Visit  Medication Sig Dispense Refill   amLODipine (NORVASC) 10 MG tablet Take 1 tablet (10 mg total) by mouth daily. 90 tablet 2   aspirin EC 81 MG tablet Take 1 tablet (81 mg total) by mouth daily. 90 tablet 3   atorvastatin (LIPITOR) 40 MG tablet Take 1 tablet (40 mg total) by mouth daily. 90 tablet 3   gabapentin (NEURONTIN) 300 MG capsule TAKE 1 CAPSULE BY MOUTH EVERYDAY AT BEDTIME 90 capsule 1   hydrochlorothiazide (HYDRODIURIL) 25 MG tablet Take 1 tablet (25 mg total) by mouth daily. 90 tablet 3   labetalol (NORMODYNE) 300 MG tablet TAKE 1 TABLET BY MOUTH 2 TIMES DAILY. 180 tablet 2   omeprazole (PRILOSEC) 40 MG capsule TAKE 1 CAPSULE BY MOUTH EVERY DAY 90 capsule 0   Vitamin D, Ergocalciferol, (DRISDOL) 1.25 MG (50000 UNIT) CAPS capsule Take 1 capsule (50,000 Units total) by mouth every 7 (seven) days. 8 capsule 0   No current facility-administered medications on file prior to visit.     Review of Systems     Objective:  There were no vitals filed for this visit. BP Readings from Last 3 Encounters:  05/04/22 138/82  02/14/22 (!) 128/58  12/05/21 (!) 140/68   Wt Readings from Last 3 Encounters:  05/04/22 167 lb (75.8 kg)  02/14/22 171 lb (77.6 kg)  12/05/21 175 lb (79.4 kg)   There is no height or weight on file to calculate BMI.    Physical Exam     Lab Results  Component Value Date   WBC 9.4 05/04/2022    HGB 12.1 05/04/2022   HCT 36.5 05/04/2022   PLT 279.0 05/04/2022   GLUCOSE 90 05/04/2022   CHOL 157 12/05/2021   TRIG 104.0 12/05/2021   HDL 50.70 12/05/2021   LDLDIRECT 141.8 07/31/2012   LDLCALC 86 12/05/2021   ALT 9 05/04/2022   AST 15 05/04/2022   NA 140 05/04/2022   K 4.1 05/04/2022   CL 103 05/04/2022   CREATININE 1.31 (H) 05/04/2022   BUN 26 (H) 05/04/2022   CO2 26 05/04/2022   TSH 1.23 05/04/2022   HGBA1C 5.9 05/04/2022     Assessment & Plan:    See Problem List for Assessment and Plan of chronic medical problems.   This encounter was created in error - please disregard.

## 2022-06-07 ENCOUNTER — Encounter: Payer: Medicare (Managed Care) | Admitting: Internal Medicine

## 2022-06-07 ENCOUNTER — Encounter: Payer: Self-pay | Admitting: Internal Medicine

## 2022-06-07 DIAGNOSIS — M85839 Other specified disorders of bone density and structure, unspecified forearm: Secondary | ICD-10-CM

## 2022-06-07 DIAGNOSIS — E559 Vitamin D deficiency, unspecified: Secondary | ICD-10-CM

## 2022-06-07 DIAGNOSIS — G629 Polyneuropathy, unspecified: Secondary | ICD-10-CM

## 2022-06-07 DIAGNOSIS — E782 Mixed hyperlipidemia: Secondary | ICD-10-CM

## 2022-06-07 DIAGNOSIS — R7989 Other specified abnormal findings of blood chemistry: Secondary | ICD-10-CM

## 2022-06-07 DIAGNOSIS — R7303 Prediabetes: Secondary | ICD-10-CM

## 2022-06-07 DIAGNOSIS — K219 Gastro-esophageal reflux disease without esophagitis: Secondary | ICD-10-CM

## 2022-06-07 NOTE — Assessment & Plan Note (Signed)
She was here about a month ago with unintentional weight loss She was having dental work which likely contributed Also having some increased stress which likely contributed Chest x-ray negative for acute disease, blood work stable Stressed smoking cessation Weight today

## 2022-06-07 NOTE — Assessment & Plan Note (Signed)
Chronic DEXA up-to-date She is very active-does a lot of walking at work Encouraged taking calcium and vitamin D daily

## 2022-06-07 NOTE — Assessment & Plan Note (Signed)
Chronic Recent A1c 5.9% Diabetic diet stressed

## 2022-06-07 NOTE — Assessment & Plan Note (Signed)
Chronic GERD controlled Continue omeprazole 40 mg daily 

## 2022-06-07 NOTE — Assessment & Plan Note (Signed)
Chronic Pain controlled Continue gabapentin 300 mg nightly

## 2022-06-07 NOTE — Assessment & Plan Note (Signed)
Chronic Low last fall Stressed taking vitamin D on daily basis

## 2022-06-07 NOTE — Assessment & Plan Note (Signed)
Chronic Cholesterol well-controlled Continue atorvastatin 40 mg daily

## 2022-06-07 NOTE — Assessment & Plan Note (Signed)
Chronic Blood pressure well controlled Continue amlodipine 10 mg daily, HCTZ 25 mg daily, labetalol 300 mg twice daily

## 2022-06-07 NOTE — Assessment & Plan Note (Signed)
Chronic Was low in the fall Stressed taking B12 on a daily basis

## 2022-06-09 NOTE — Progress Notes (Signed)
      Subjective:    Patient ID: [name], female    DOB: 07/31/1945, 77 y.o.   MRN: [id number]     HPI [name] is here for follow up of her chronic medical problems, including htn, hld, prediabetes, B12 def, vit d def, neuropathy at site of breast surgery, gerd, osteopenia and weight loss  - seen one month ago    No labs-just had them --- stress B12, D supp  Advised taking calcium vitamin D Still taking gabapentin-working  Medications and allergies reviewed with patient and updated if appropriate.  Current Outpatient Medications on File Prior to Visit  Medication Sig Dispense Refill   amLODipine (NORVASC) 10 MG tablet Take 1 tablet (10 mg total) by mouth daily. 90 tablet 2   aspirin EC 81 MG tablet Take 1 tablet (81 mg total) by mouth daily. 90 tablet 3   atorvastatin (LIPITOR) 40 MG tablet Take 1 tablet (40 mg total) by mouth daily. 90 tablet 3   gabapentin (NEURONTIN) 300 MG capsule TAKE 1 CAPSULE BY MOUTH EVERYDAY AT BEDTIME 90 capsule 1   hydrochlorothiazide (HYDRODIURIL) 25 MG tablet Take 1 tablet (25 mg total) by mouth daily. 90 tablet 3   labetalol (NORMODYNE) 300 MG tablet TAKE 1 TABLET BY MOUTH 2 TIMES DAILY. 180 tablet 2   omeprazole (PRILOSEC) 40 MG capsule TAKE 1 CAPSULE BY MOUTH EVERY DAY 90 capsule 0   Vitamin D, Ergocalciferol, (DRISDOL) 1.25 MG (50000 UNIT) CAPS capsule Take 1 capsule (50,000 Units total) by mouth every 7 (seven) days. 8 capsule 0   No current facility-administered medications on file prior to visit.     Review of Systems     Objective:  There were no vitals filed for this visit. BP Readings from Last 3 Encounters:  05/04/22 138/82  02/14/22 (!) 128/58  12/05/21 (!) 140/68   Wt Readings from Last 3 Encounters:  05/04/22 167 lb (75.8 kg)  02/14/22 171 lb (77.6 kg)  12/05/21 175 lb (79.4 kg)   There is no height or weight on file to calculate BMI.    Physical Exam     Lab Results  Component Value Date   WBC 9.4 05/04/2022    HGB 12.1 05/04/2022   HCT 36.5 05/04/2022   PLT 279.0 05/04/2022   GLUCOSE 90 05/04/2022   CHOL 157 12/05/2021   TRIG 104.0 12/05/2021   HDL 50.70 12/05/2021   LDLDIRECT 141.8 07/31/2012   LDLCALC 86 12/05/2021   ALT 9 05/04/2022   AST 15 05/04/2022   NA 140 05/04/2022   K 4.1 05/04/2022   CL 103 05/04/2022   CREATININE 1.31 (H) 05/04/2022   BUN 26 (H) 05/04/2022   CO2 26 05/04/2022   TSH 1.23 05/04/2022   HGBA1C 5.9 05/04/2022     Assessment & Plan:    See Problem List for Assessment and Plan of chronic medical problems.   This encounter was created in error - please disregard. 

## 2022-06-09 NOTE — Patient Instructions (Addendum)
       Medications changes include :       A referral was ordered for XXX.     Someone will call you to schedule an appointment.    Return in about 6 months (around 12/12/2022) for Physical Exam.

## 2022-06-11 ENCOUNTER — Encounter: Payer: Medicare (Managed Care) | Admitting: Internal Medicine

## 2022-06-11 ENCOUNTER — Encounter: Payer: Self-pay | Admitting: Internal Medicine

## 2022-06-11 DIAGNOSIS — R7989 Other specified abnormal findings of blood chemistry: Secondary | ICD-10-CM

## 2022-06-11 DIAGNOSIS — M85839 Other specified disorders of bone density and structure, unspecified forearm: Secondary | ICD-10-CM

## 2022-06-11 DIAGNOSIS — R7303 Prediabetes: Secondary | ICD-10-CM

## 2022-06-11 DIAGNOSIS — R634 Abnormal weight loss: Secondary | ICD-10-CM

## 2022-06-11 DIAGNOSIS — K219 Gastro-esophageal reflux disease without esophagitis: Secondary | ICD-10-CM

## 2022-06-11 DIAGNOSIS — E782 Mixed hyperlipidemia: Secondary | ICD-10-CM

## 2022-06-11 DIAGNOSIS — I10 Essential (primary) hypertension: Secondary | ICD-10-CM

## 2022-06-11 DIAGNOSIS — E559 Vitamin D deficiency, unspecified: Secondary | ICD-10-CM

## 2022-06-11 DIAGNOSIS — G629 Polyneuropathy, unspecified: Secondary | ICD-10-CM

## 2022-06-11 NOTE — Assessment & Plan Note (Addendum)
Chronic Blood pressure well controlled Continue amlodipine 10 mg daily, HCTZ 25 mg daily, labetalol 300 mg twice daily

## 2022-06-11 NOTE — Assessment & Plan Note (Signed)
Chronic GERD controlled Continue omeprazole 40 mg daily 

## 2022-06-11 NOTE — Assessment & Plan Note (Signed)
Chronic Cholesterol adequately controlled Continue atorvastatin 40 mg daily Encouraged regular exercise, heart healthy diet

## 2022-06-11 NOTE — Assessment & Plan Note (Signed)
Chronic Lab Results  Component Value Date   HGBA1C 5.9 05/04/2022   Diabetic diet, regular exercise stressed

## 2022-06-11 NOTE — Assessment & Plan Note (Signed)
Chronic Had a high dose last fall for 2 months Stressed taking vitamin D daily

## 2022-06-11 NOTE — Assessment & Plan Note (Signed)
Chronic Controlled, Stable Continue gabapentin 300 mg nightly

## 2022-06-11 NOTE — Assessment & Plan Note (Signed)
Chronic Stressed taking vitamin B12 daily

## 2022-06-17 ENCOUNTER — Encounter: Payer: Self-pay | Admitting: Internal Medicine

## 2022-06-17 NOTE — Patient Instructions (Addendum)
      Blood work was ordered.   The lab is on the first floor.    Medications changes include :       A referral was ordered for XXX.     Someone will call you to schedule an appointment.    Return in about 6 months (around 12/19/2022) for follow up.

## 2022-06-17 NOTE — Progress Notes (Signed)
      Subjective:    Patient ID: Ryver Olkowski, female    DOB: 02/02/1946, 77 y.o.   MRN: [id number]     HPI Nikkol is here for follow up of her chronic medical problems, including htn, hld, prediabetes, B12 def, vit d def, neuropathy at site of breast surgery, gerd, osteopenia and weight loss  - seen one month ago    No labs-just had them --- stress B12, D supp  Advised taking calcium vitamin D Still taking gabapentin-working?    Medications and allergies reviewed with patient and updated if appropriate.  Current Outpatient Medications on File Prior to Visit  Medication Sig Dispense Refill   amLODipine (NORVASC) 10 MG tablet Take 1 tablet (10 mg total) by mouth daily. 90 tablet 2   aspirin EC 81 MG tablet Take 1 tablet (81 mg total) by mouth daily. 90 tablet 3   atorvastatin (LIPITOR) 40 MG tablet Take 1 tablet (40 mg total) by mouth daily. 90 tablet 3   gabapentin (NEURONTIN) 300 MG capsule TAKE 1 CAPSULE BY MOUTH EVERYDAY AT BEDTIME 90 capsule 1   hydrochlorothiazide (HYDRODIURIL) 25 MG tablet Take 1 tablet (25 mg total) by mouth daily. 90 tablet 3   labetalol (NORMODYNE) 300 MG tablet TAKE 1 TABLET BY MOUTH 2 TIMES DAILY. 180 tablet 2   omeprazole (PRILOSEC) 40 MG capsule TAKE 1 CAPSULE BY MOUTH EVERY DAY 90 capsule 0   Vitamin D, Ergocalciferol, (DRISDOL) 1.25 MG (50000 UNIT) CAPS capsule Take 1 capsule (50,000 Units total) by mouth every 7 (seven) days. 8 capsule 0   No current facility-administered medications on file prior to visit.     Review of Systems     Objective:  There were no vitals filed for this visit. BP Readings from Last 3 Encounters:  05/04/22 138/82  02/14/22 (!) 128/58  12/05/21 (!) 140/68   Wt Readings from Last 3 Encounters:  05/04/22 167 lb (75.8 kg)  02/14/22 171 lb (77.6 kg)  12/05/21 175 lb (79.4 kg)   There is no height or weight on file to calculate BMI.    Physical Exam     Lab Results  Component Value Date   WBC 9.4  05/04/2022   HGB 12.1 05/04/2022   HCT 36.5 05/04/2022   PLT 279.0 05/04/2022   GLUCOSE 90 05/04/2022   CHOL 157 12/05/2021   TRIG 104.0 12/05/2021   HDL 50.70 12/05/2021   LDLDIRECT 141.8 07/31/2012   LDLCALC 86 12/05/2021   ALT 9 05/04/2022   AST 15 05/04/2022   NA 140 05/04/2022   K 4.1 05/04/2022   CL 103 05/04/2022   CREATININE 1.31 (H) 05/04/2022   BUN 26 (H) 05/04/2022   CO2 26 05/04/2022   TSH 1.23 05/04/2022   HGBA1C 5.9 05/04/2022     Assessment & Plan:    See Problem List for Assessment and Plan of chronic medical problems.    

## 2022-06-18 ENCOUNTER — Encounter: Payer: Medicare (Managed Care) | Admitting: Internal Medicine

## 2022-06-18 DIAGNOSIS — I10 Essential (primary) hypertension: Secondary | ICD-10-CM

## 2022-06-18 DIAGNOSIS — M85839 Other specified disorders of bone density and structure, unspecified forearm: Secondary | ICD-10-CM

## 2022-06-18 DIAGNOSIS — E782 Mixed hyperlipidemia: Secondary | ICD-10-CM

## 2022-06-18 DIAGNOSIS — R634 Abnormal weight loss: Secondary | ICD-10-CM

## 2022-06-18 DIAGNOSIS — G629 Polyneuropathy, unspecified: Secondary | ICD-10-CM

## 2022-06-18 DIAGNOSIS — K219 Gastro-esophageal reflux disease without esophagitis: Secondary | ICD-10-CM

## 2022-06-18 DIAGNOSIS — R7303 Prediabetes: Secondary | ICD-10-CM

## 2022-06-19 ENCOUNTER — Other Ambulatory Visit: Payer: Medicare (Managed Care)

## 2022-06-21 ENCOUNTER — Encounter: Payer: Self-pay | Admitting: Internal Medicine

## 2022-06-21 NOTE — Progress Notes (Signed)
      Subjective:    Patient ID: [name], female    DOB: 17-Dec-1945, 77 y.o.   MRN: [id number]     HPI Mileidy is here for follow up of her chronic medical problems, including htn, hld, prediabetes, B12 def, vit d def, neuropathy at site of breast surgery, gerd, osteopenia and weight loss  - seen one month ago    No labs-just had them --- stress B12, D supp  Advised taking calcium vitamin D Still taking gabapentin-working?    Medications and allergies reviewed with patient and updated if appropriate.  Current Outpatient Medications on File Prior to Visit  Medication Sig Dispense Refill  . amLODipine (NORVASC) 10 MG tablet Take 1 tablet (10 mg total) by mouth daily. 90 tablet 2  . aspirin EC 81 MG tablet Take 1 tablet (81 mg total) by mouth daily. 90 tablet 3  . atorvastatin (LIPITOR) 40 MG tablet Take 1 tablet (40 mg total) by mouth daily. 90 tablet 3  . gabapentin (NEURONTIN) 300 MG capsule TAKE 1 CAPSULE BY MOUTH EVERYDAY AT BEDTIME 90 capsule 1  . hydrochlorothiazide (HYDRODIURIL) 25 MG tablet Take 1 tablet (25 mg total) by mouth daily. 90 tablet 3  . labetalol (NORMODYNE) 300 MG tablet TAKE 1 TABLET BY MOUTH 2 TIMES DAILY. 180 tablet 2  . Vitamin D, Ergocalciferol, (DRISDOL) 1.25 MG (50000 UNIT) CAPS capsule Take 1 capsule (50,000 Units total) by mouth every 7 (seven) days. 8 capsule 0   No current facility-administered medications on file prior to visit.     Review of Systems     Objective:  There were no vitals filed for this visit. BP Readings from Last 3 Encounters:  07/09/22 120/82  05/04/22 138/82  02/14/22 (!) 128/58   Wt Readings from Last 3 Encounters:  07/09/22 169 lb (76.7 kg)  05/04/22 167 lb (75.8 kg)  02/14/22 171 lb (77.6 kg)   There is no height or weight on file to calculate BMI.    Physical Exam     Lab Results  Component Value Date   WBC 9.4 05/04/2022   HGB 12.1 05/04/2022   HCT 36.5 05/04/2022   PLT 279.0 05/04/2022    GLUCOSE 90 05/04/2022   CHOL 157 12/05/2021   TRIG 104.0 12/05/2021   HDL 50.70 12/05/2021   LDLDIRECT 141.8 07/31/2012   LDLCALC 86 12/05/2021   ALT 9 05/04/2022   AST 15 05/04/2022   NA 140 05/04/2022   K 4.1 05/04/2022   CL 103 05/04/2022   CREATININE 1.31 (H) 05/04/2022   BUN 26 (H) 05/04/2022   CO2 26 05/04/2022   TSH 1.23 05/04/2022   HGBA1C 5.9 05/04/2022     Assessment & Plan:    See Problem List for Assessment and Plan of chronic medical problems.    This encounter was created in error - please disregard.

## 2022-06-21 NOTE — Patient Instructions (Addendum)
      Blood work was ordered.   The lab is on the first floor.    Medications changes include :       A referral was ordered for XXX.     Someone will call you to schedule an appointment.    Return in about 6 months (around 12/23/2022) for Physical Exam.

## 2022-06-22 ENCOUNTER — Encounter: Payer: Medicare (Managed Care) | Admitting: Internal Medicine

## 2022-06-22 DIAGNOSIS — E559 Vitamin D deficiency, unspecified: Secondary | ICD-10-CM

## 2022-06-22 DIAGNOSIS — R634 Abnormal weight loss: Secondary | ICD-10-CM

## 2022-06-22 DIAGNOSIS — R7303 Prediabetes: Secondary | ICD-10-CM

## 2022-06-22 DIAGNOSIS — K219 Gastro-esophageal reflux disease without esophagitis: Secondary | ICD-10-CM

## 2022-06-22 DIAGNOSIS — E782 Mixed hyperlipidemia: Secondary | ICD-10-CM

## 2022-06-22 DIAGNOSIS — I10 Essential (primary) hypertension: Secondary | ICD-10-CM

## 2022-06-22 DIAGNOSIS — R7989 Other specified abnormal findings of blood chemistry: Secondary | ICD-10-CM

## 2022-06-22 DIAGNOSIS — M85839 Other specified disorders of bone density and structure, unspecified forearm: Secondary | ICD-10-CM

## 2022-06-22 DIAGNOSIS — G629 Polyneuropathy, unspecified: Secondary | ICD-10-CM

## 2022-06-22 NOTE — Assessment & Plan Note (Signed)
Chronic Controlled, Stable Continue gabapentin 300 mg nightly 

## 2022-06-22 NOTE — Assessment & Plan Note (Signed)
Chronic Stressed taking vitamin B12 daily 

## 2022-06-22 NOTE — Assessment & Plan Note (Signed)
Chronic Cholesterol adequately controlled Continue atorvastatin 40 mg daily Encouraged regular exercise, heart healthy diet 

## 2022-06-22 NOTE — Assessment & Plan Note (Signed)
Chronic Blood pressure well controlled Continue amlodipine 10 mg daily, HCTZ 25 mg daily, labetalol 300 mg twice daily 

## 2022-06-22 NOTE — Assessment & Plan Note (Signed)
Chronic Had a high dose last fall for 2 months Stressed taking vitamin D daily 

## 2022-06-22 NOTE — Assessment & Plan Note (Signed)
Chronic Lab Results  Component Value Date   HGBA1C 5.9 05/04/2022   Diabetic diet, regular exercise stressed 

## 2022-06-22 NOTE — Assessment & Plan Note (Signed)
Chronic GERD controlled Continue omeprazole 40 mg daily 

## 2022-06-24 NOTE — Progress Notes (Deleted)
      Subjective:    Patient ID: [name], female    DOB: 05/11/1945, 77 y.o.   MRN: [id number]     HPI [name] is here for follow up of her chronic medical problems, including htn, hld, prediabetes, B12 def, vit d def, neuropathy at site of breast surgery, gerd, osteopenia and weight loss  - seen one month ago    No labs-just had them --- stress B12, D supp  Advised taking calcium vitamin D Still taking gabapentin-working?    Medications and allergies reviewed with patient and updated if appropriate.  Current Outpatient Medications on File Prior to Visit  Medication Sig Dispense Refill   amLODipine (NORVASC) 10 MG tablet Take 1 tablet (10 mg total) by mouth daily. 90 tablet 2   aspirin EC 81 MG tablet Take 1 tablet (81 mg total) by mouth daily. 90 tablet 3   atorvastatin (LIPITOR) 40 MG tablet Take 1 tablet (40 mg total) by mouth daily. 90 tablet 3   gabapentin (NEURONTIN) 300 MG capsule TAKE 1 CAPSULE BY MOUTH EVERYDAY AT BEDTIME 90 capsule 1   hydrochlorothiazide (HYDRODIURIL) 25 MG tablet Take 1 tablet (25 mg total) by mouth daily. 90 tablet 3   labetalol (NORMODYNE) 300 MG tablet TAKE 1 TABLET BY MOUTH 2 TIMES DAILY. 180 tablet 2   omeprazole (PRILOSEC) 40 MG capsule TAKE 1 CAPSULE BY MOUTH EVERY DAY 90 capsule 0   Vitamin D, Ergocalciferol, (DRISDOL) 1.25 MG (50000 UNIT) CAPS capsule Take 1 capsule (50,000 Units total) by mouth every 7 (seven) days. 8 capsule 0   No current facility-administered medications on file prior to visit.     Review of Systems     Objective:  There were no vitals filed for this visit. BP Readings from Last 3 Encounters:  05/04/22 138/82  02/14/22 (!) 128/58  12/05/21 (!) 140/68   Wt Readings from Last 3 Encounters:  05/04/22 167 lb (75.8 kg)  02/14/22 171 lb (77.6 kg)  12/05/21 175 lb (79.4 kg)   There is no height or weight on file to calculate BMI.    Physical Exam     Lab Results  Component Value Date   WBC 9.4  05/04/2022   HGB 12.1 05/04/2022   HCT 36.5 05/04/2022   PLT 279.0 05/04/2022   GLUCOSE 90 05/04/2022   CHOL 157 12/05/2021   TRIG 104.0 12/05/2021   HDL 50.70 12/05/2021   LDLDIRECT 141.8 07/31/2012   LDLCALC 86 12/05/2021   ALT 9 05/04/2022   AST 15 05/04/2022   NA 140 05/04/2022   K 4.1 05/04/2022   CL 103 05/04/2022   CREATININE 1.31 (H) 05/04/2022   BUN 26 (H) 05/04/2022   CO2 26 05/04/2022   TSH 1.23 05/04/2022   HGBA1C 5.9 05/04/2022     Assessment & Plan:    See Problem List for Assessment and Plan of chronic medical problems.    

## 2022-06-25 ENCOUNTER — Ambulatory Visit: Payer: Medicare (Managed Care) | Admitting: Internal Medicine

## 2022-07-03 ENCOUNTER — Telehealth: Payer: Self-pay

## 2022-07-03 NOTE — Telephone Encounter (Signed)
Called patient to schedule Medicare Annual Wellness Visit (AWV). Left message for patient to call back and schedule Medicare Annual Wellness Visit (AWV).  Last date of AWV: 06/22/20  Please schedule an appointment at any time with [facility].  Norton Blizzard, [place] ([facility])  [facility] [phone]

## 2022-07-06 ENCOUNTER — Other Ambulatory Visit: Payer: Self-pay | Admitting: Internal Medicine

## 2022-07-08 NOTE — Progress Notes (Unsigned)
Subjective:    Patient ID: [name], female    DOB: Jul 08, 1945, 77 y.o.   MRN: [id number]     HPI [name] is here for follow up of her chronic medical problems, including htn, hld, prediabetes, B12 def, vit d def, neuropathy at site of breast surgery, gerd, osteopenia and weight loss  - seen one month ago   She takes gabapentin as needed.  Weight has been stable.    Cramping in legs.  Left lower leg > right lower leg.   Only at night.  Not drinking as much water as she should - she has increased her water intake.  Never had back issues.    Medications and allergies reviewed with patient and updated if appropriate.  Current Outpatient Medications on File Prior to Visit  Medication Sig Dispense Refill   amLODipine (NORVASC) 10 MG tablet Take 1 tablet (10 mg total) by mouth daily. 90 tablet 2   aspirin EC 81 MG tablet Take 1 tablet (81 mg total) by mouth daily. 90 tablet 3   atorvastatin (LIPITOR) 40 MG tablet Take 1 tablet (40 mg total) by mouth daily. 90 tablet 3   gabapentin (NEURONTIN) 300 MG capsule TAKE 1 CAPSULE BY MOUTH EVERYDAY AT BEDTIME 90 capsule 1   hydrochlorothiazide (HYDRODIURIL) 25 MG tablet Take 1 tablet (25 mg total) by mouth daily. 90 tablet 3   labetalol (NORMODYNE) 300 MG tablet TAKE 1 TABLET BY MOUTH 2 TIMES DAILY. 180 tablet 2   omeprazole (PRILOSEC) 40 MG capsule TAKE 1 CAPSULE BY MOUTH EVERY DAY 90 capsule 0   Vitamin D, Ergocalciferol, (DRISDOL) 1.25 MG (50000 UNIT) CAPS capsule Take 1 capsule (50,000 Units total) by mouth every 7 (seven) days. 8 capsule 0   No current facility-administered medications on file prior to visit.     Review of Systems  Constitutional:  Negative for fever.  Respiratory:  Positive for wheezing (occ). Negative for cough and shortness of breath.   Cardiovascular:  Negative for chest pain, palpitations and leg swelling.  Musculoskeletal:        Leg cramping   Neurological:  Negative for light-headedness and  headaches.       Objective:   Vitals:   07/09/22 1534  BP: 120/82  Pulse: (!) 53  Temp: 98.3 F (36.8 C)  SpO2: 92%   BP Readings from Last 3 Encounters:  07/09/22 120/82  05/04/22 138/82  02/14/22 (!) 128/58   Wt Readings from Last 3 Encounters:  07/09/22 169 lb (76.7 kg)  05/04/22 167 lb (75.8 kg)  02/14/22 171 lb (77.6 kg)   Body mass index is 29.01 kg/m.    Physical Exam Constitutional:      General: She is not in acute distress.    Appearance: Normal appearance.  HENT:     Head: Normocephalic and atraumatic.  Eyes:     Conjunctiva/sclera: Conjunctivae normal.  Cardiovascular:     Rate and Rhythm: Normal rate and regular rhythm.     Heart sounds: Murmur (3/6 sys) heard.  Pulmonary:     Effort: Pulmonary effort is normal. No respiratory distress.     Breath sounds: Normal breath sounds. No wheezing.  Musculoskeletal:     Cervical back: Neck supple.     Right lower leg: No edema.     Left lower leg: No edema.  Lymphadenopathy:     Cervical: No cervical adenopathy.  Skin:    General: Skin is warm and dry.     Findings:  No rash.  Neurological:     Mental Status: She is alert. Mental status is at baseline.  Psychiatric:        Mood and Affect: Mood normal.        Behavior: Behavior normal.        Lab Results  Component Value Date   WBC 9.4 05/04/2022   HGB 12.1 05/04/2022   HCT 36.5 05/04/2022   PLT 279.0 05/04/2022   GLUCOSE 90 05/04/2022   CHOL 157 12/05/2021   TRIG 104.0 12/05/2021   HDL 50.70 12/05/2021   LDLDIRECT 141.8 07/31/2012   LDLCALC 86 12/05/2021   ALT 9 05/04/2022   AST 15 05/04/2022   NA 140 05/04/2022   K 4.1 05/04/2022   CL 103 05/04/2022   CREATININE 1.31 (H) 05/04/2022   BUN 26 (H) 05/04/2022   CO2 26 05/04/2022   TSH 1.23 05/04/2022   HGBA1C 5.9 05/04/2022     Assessment & Plan:    See Problem List for Assessment and Plan of chronic medical problems.

## 2022-07-09 ENCOUNTER — Ambulatory Visit (INDEPENDENT_AMBULATORY_CARE_PROVIDER_SITE_OTHER): Payer: Medicare HMO | Admitting: Internal Medicine

## 2022-07-09 ENCOUNTER — Encounter: Payer: Self-pay | Admitting: Internal Medicine

## 2022-07-09 VITALS — BP 120/82 | HR 53 | Temp 98.3°F | Ht 64.0 in | Wt 169.0 lb

## 2022-07-09 DIAGNOSIS — E782 Mixed hyperlipidemia: Secondary | ICD-10-CM | POA: Diagnosis not present

## 2022-07-09 DIAGNOSIS — G629 Polyneuropathy, unspecified: Secondary | ICD-10-CM

## 2022-07-09 DIAGNOSIS — K219 Gastro-esophageal reflux disease without esophagitis: Secondary | ICD-10-CM

## 2022-07-09 DIAGNOSIS — E559 Vitamin D deficiency, unspecified: Secondary | ICD-10-CM | POA: Diagnosis not present

## 2022-07-09 DIAGNOSIS — R7303 Prediabetes: Secondary | ICD-10-CM

## 2022-07-09 DIAGNOSIS — R252 Cramp and spasm: Secondary | ICD-10-CM | POA: Diagnosis not present

## 2022-07-09 DIAGNOSIS — I10 Essential (primary) hypertension: Secondary | ICD-10-CM | POA: Diagnosis not present

## 2022-07-09 DIAGNOSIS — R634 Abnormal weight loss: Secondary | ICD-10-CM

## 2022-07-09 DIAGNOSIS — R7989 Other specified abnormal findings of blood chemistry: Secondary | ICD-10-CM | POA: Diagnosis not present

## 2022-07-09 NOTE — Assessment & Plan Note (Signed)
Chronic Cholesterol adequately controlled Continue atorvastatin 40 mg daily Encouraged regular exercise, heart healthy diet 

## 2022-07-09 NOTE — Assessment & Plan Note (Signed)
Chronic Continue B12 supplementation 

## 2022-07-09 NOTE — Assessment & Plan Note (Signed)
Chronic A1c 5.9% Low sugar/carbohydrate diet

## 2022-07-09 NOTE — Assessment & Plan Note (Signed)
Chronic Blood pressure well controlled Continue amlodipine 10 mg daily, HCTZ 25 mg daily, labetalol 300 mg twice daily 

## 2022-07-09 NOTE — Assessment & Plan Note (Signed)
Chronic Continue vitamin D supplementation 

## 2022-07-09 NOTE — Assessment & Plan Note (Signed)
She was concerned about her weight loss, but weight seems to be stable She is eating regularly for the most part, but there will be days where she eats less than normal which is related to several things Will continue to monitor weight

## 2022-07-09 NOTE — Assessment & Plan Note (Signed)
Chronic GERD controlled Continue omeprazole 40 mg daily 

## 2022-07-09 NOTE — Patient Instructions (Addendum)
       Medications changes include :   start taking a B12 pill daily     Return in about 6 months (around 01/08/2023) for Physical Exam.

## 2022-07-09 NOTE — Assessment & Plan Note (Signed)
Acute Nocturnal She has increased her water intake-stressed continuing increased water Denies back pain ?  Related to hydrochlorothiazide Most recent potassium within normal limits

## 2022-07-09 NOTE — Assessment & Plan Note (Signed)
Chronic Taking gabapentin 300 mg nightly prn

## 2022-08-26 DIAGNOSIS — Z853 Personal history of malignant neoplasm of breast: Secondary | ICD-10-CM | POA: Diagnosis not present

## 2022-08-26 DIAGNOSIS — I10 Essential (primary) hypertension: Secondary | ICD-10-CM | POA: Diagnosis not present

## 2022-08-26 DIAGNOSIS — Z720 Tobacco use: Secondary | ICD-10-CM | POA: Diagnosis not present

## 2022-08-26 DIAGNOSIS — K219 Gastro-esophageal reflux disease without esophagitis: Secondary | ICD-10-CM | POA: Diagnosis not present

## 2022-08-26 DIAGNOSIS — E785 Hyperlipidemia, unspecified: Secondary | ICD-10-CM | POA: Diagnosis not present

## 2022-09-08 ENCOUNTER — Other Ambulatory Visit: Payer: Self-pay | Admitting: Internal Medicine

## 2022-10-26 ENCOUNTER — Encounter: Payer: Self-pay | Admitting: *Deleted

## 2022-11-01 ENCOUNTER — Telehealth: Payer: Self-pay | Admitting: *Deleted

## 2022-11-01 NOTE — Telephone Encounter (Signed)
I attempted to contact patient by telephone but was unsuccessful. According to the patient's chart they are due for follow up with  [facility]. I have left a HIPAA compliant message advising the patient to contact [facility] at [phone]. I will continue to follow up with the patient to make sure this appointment is scheduled.

## 2022-12-06 ENCOUNTER — Other Ambulatory Visit: Payer: Self-pay | Admitting: Cardiovascular Disease

## 2022-12-20 ENCOUNTER — Other Ambulatory Visit: Payer: Self-pay | Admitting: Cardiovascular Disease

## 2022-12-27 ENCOUNTER — Other Ambulatory Visit: Payer: Self-pay | Admitting: Internal Medicine

## 2023-02-11 ENCOUNTER — Other Ambulatory Visit: Payer: Self-pay | Admitting: Internal Medicine

## 2023-02-11 DIAGNOSIS — Z9889 Other specified postprocedural states: Secondary | ICD-10-CM

## 2023-02-11 DIAGNOSIS — N63 Unspecified lump in unspecified breast: Secondary | ICD-10-CM

## 2023-02-18 ENCOUNTER — Other Ambulatory Visit: Payer: Medicare HMO

## 2023-03-05 ENCOUNTER — Other Ambulatory Visit: Payer: Self-pay | Admitting: Cardiovascular Disease

## 2023-03-29 ENCOUNTER — Other Ambulatory Visit: Payer: Self-pay | Admitting: Cardiovascular Disease

## 2023-04-07 ENCOUNTER — Other Ambulatory Visit: Payer: Self-pay | Admitting: Cardiovascular Disease

## 2023-04-07 DIAGNOSIS — I6523 Occlusion and stenosis of bilateral carotid arteries: Secondary | ICD-10-CM

## 2023-04-14 ENCOUNTER — Other Ambulatory Visit: Payer: Self-pay | Admitting: Cardiovascular Disease

## 2023-04-14 DIAGNOSIS — I6523 Occlusion and stenosis of bilateral carotid arteries: Secondary | ICD-10-CM

## 2023-05-01 ENCOUNTER — Other Ambulatory Visit: Payer: Self-pay | Admitting: Cardiovascular Disease

## 2023-05-09 ENCOUNTER — Encounter: Payer: Self-pay | Admitting: Internal Medicine

## 2023-05-09 NOTE — Patient Instructions (Addendum)

## 2023-05-09 NOTE — Progress Notes (Signed)
 Subjective:    Patient ID: [name], female    DOB: July 19, 1945, 78 y.o.   MRN: [id number]      HPI [name] is here for a Physical exam and her chronic medical problems.      Medications and allergies reviewed with patient and updated if appropriate.  Current Outpatient Medications on File Prior to Visit  Medication Sig Dispense Refill  . amLODipine  (NORVASC ) 10 MG tablet TAKE 1 TABLET BY MOUTH EVERY DAY 90 tablet 2  . aspirin  EC 81 MG tablet Take 1 tablet (81 mg total) by mouth daily. 90 tablet 3  . gabapentin  (NEURONTIN ) 300 MG capsule TAKE 1 CAPSULE BY MOUTH EVERYDAY AT BEDTIME 90 capsule 1  . hydrochlorothiazide  (HYDRODIURIL ) 25 MG tablet TAKE 1 TABLET (25 MG TOTAL) BY MOUTH DAILY. 15 tablet 0  . labetalol  (NORMODYNE ) 300 MG tablet TAKE 1 TABLET BY MOUTH 2 TIMES DAILY. 180 tablet 2  . Vitamin D , Ergocalciferol , (DRISDOL ) 1.25 MG (50000 UNIT) CAPS capsule Take 1 capsule (50,000 Units total) by mouth every 7 (seven) days. 8 capsule 0   No current facility-administered medications on file prior to visit.    Review of Systems     Objective:  There were no vitals filed for this visit. There were no vitals filed for this visit. There is no height or weight on file to calculate BMI.  BP Readings from Last 3 Encounters:  06/17/23 (!) 146/90  07/09/22 120/82  05/04/22 138/82    Wt Readings from Last 3 Encounters:  06/17/23 162 lb (73.5 kg)  05/31/23 169 lb (76.7 kg)  07/09/22 169 lb (76.7 kg)       Physical Exam Constitutional: She appears well-developed and well-nourished. No distress.  HENT:  Head: Normocephalic and atraumatic.  Right Ear: External ear normal. Normal ear canal and TM Left Ear: External ear normal.  Normal ear canal and TM Mouth/Throat: Oropharynx is clear and moist.  Eyes: Conjunctivae normal.  Neck: Neck supple. No tracheal deviation present. No thyromegaly present.  No carotid bruit  Cardiovascular: Normal rate, regular rhythm and  normal heart sounds.   No murmur heard.  No edema. Pulmonary/Chest: Effort normal and breath sounds normal. No respiratory distress. She has no wheezes. She has no rales.  Breast: deferred   Abdominal: Soft. She exhibits no distension. There is no tenderness.  Lymphadenopathy: She has no cervical adenopathy.  Skin: Skin is warm and dry. She is not diaphoretic.  Psychiatric: She has a normal mood and affect. Her behavior is normal.     Lab Results  Component Value Date   WBC 9.4 05/04/2022   HGB 12.1 05/04/2022   HCT 36.5 05/04/2022   PLT 279.0 05/04/2022   GLUCOSE 90 05/04/2022   CHOL 157 12/05/2021   TRIG 104.0 12/05/2021   HDL 50.70 12/05/2021   LDLDIRECT 141.8 07/31/2012   LDLCALC 86 12/05/2021   ALT 9 05/04/2022   AST 15 05/04/2022   NA 140 05/04/2022   K 4.1 05/04/2022   CL 103 05/04/2022   CREATININE 1.31 (H) 05/04/2022   BUN 26 (H) 05/04/2022   CO2 26 05/04/2022   TSH 1.23 05/04/2022   HGBA1C 5.9 05/04/2022         Assessment & Plan:   Physical exam: Screening blood work  ordered Exercise   Weight   Substance abuse  none   Reviewed recommended immunizations.   Health Maintenance  Topic Date Due  . DTaP/Tdap/Td (1 - Tdap) Never done  . Pneumonia  Vaccine 43+ Years old (1 of 2 - PCV) Never done  . Lung Cancer Screening  08/13/2008  . COVID-19 Vaccine (4 - 2024-25 season) 12/09/2022  . Zoster Vaccines- Shingrix (1 of 2) 09/17/2023 (Originally [birth year]/1966)  . Medicare Annual Wellness (AWV)  07/24/2024  . DEXA SCAN  07/24/2028  . Hepatitis C Screening  Completed  . HPV VACCINES  Aged Out  . Meningococcal B Vaccine  Aged Out  . INFLUENZA VACCINE  Discontinued  . Colonoscopy  Discontinued          See Problem List for Assessment and Plan of chronic medical problems.     This encounter was created in error - please disregard.

## 2023-05-10 ENCOUNTER — Encounter: Payer: Medicare HMO | Admitting: Internal Medicine

## 2023-05-10 DIAGNOSIS — R7989 Other specified abnormal findings of blood chemistry: Secondary | ICD-10-CM

## 2023-05-10 DIAGNOSIS — M85839 Other specified disorders of bone density and structure, unspecified forearm: Secondary | ICD-10-CM

## 2023-05-10 DIAGNOSIS — E782 Mixed hyperlipidemia: Secondary | ICD-10-CM

## 2023-05-10 DIAGNOSIS — Z0000 Encounter for general adult medical examination without abnormal findings: Secondary | ICD-10-CM

## 2023-05-10 DIAGNOSIS — G629 Polyneuropathy, unspecified: Secondary | ICD-10-CM

## 2023-05-10 DIAGNOSIS — R7303 Prediabetes: Secondary | ICD-10-CM

## 2023-05-10 DIAGNOSIS — I10 Essential (primary) hypertension: Secondary | ICD-10-CM

## 2023-05-10 DIAGNOSIS — K219 Gastro-esophageal reflux disease without esophagitis: Secondary | ICD-10-CM

## 2023-05-10 DIAGNOSIS — E559 Vitamin D deficiency, unspecified: Secondary | ICD-10-CM

## 2023-05-12 ENCOUNTER — Other Ambulatory Visit: Payer: Self-pay | Admitting: Internal Medicine

## 2023-05-21 ENCOUNTER — Other Ambulatory Visit: Payer: Self-pay | Admitting: Cardiovascular Disease

## 2023-05-21 DIAGNOSIS — I6523 Occlusion and stenosis of bilateral carotid arteries: Secondary | ICD-10-CM

## 2023-05-27 ENCOUNTER — Other Ambulatory Visit: Payer: Self-pay | Admitting: Cardiovascular Disease

## 2023-05-27 ENCOUNTER — Other Ambulatory Visit: Payer: Self-pay | Admitting: Internal Medicine

## 2023-05-27 DIAGNOSIS — I6523 Occlusion and stenosis of bilateral carotid arteries: Secondary | ICD-10-CM

## 2023-05-29 NOTE — Telephone Encounter (Signed)
Dr. Earmon Phoenix pt. She was last seen in 2023 and is passed her 3rd attempt. Does Dr. Excell Seltzer want to refill? Please advise.

## 2023-05-30 ENCOUNTER — Encounter: Payer: Self-pay | Admitting: *Deleted

## 2023-05-31 ENCOUNTER — Ambulatory Visit: Payer: Medicare HMO

## 2023-05-31 VITALS — Ht 64.0 in | Wt 169.0 lb

## 2023-05-31 DIAGNOSIS — M8580 Other specified disorders of bone density and structure, unspecified site: Secondary | ICD-10-CM

## 2023-05-31 DIAGNOSIS — Z0000 Encounter for general adult medical examination without abnormal findings: Secondary | ICD-10-CM

## 2023-05-31 DIAGNOSIS — Z780 Asymptomatic menopausal state: Secondary | ICD-10-CM | POA: Diagnosis not present

## 2023-05-31 DIAGNOSIS — Z122 Encounter for screening for malignant neoplasm of respiratory organs: Secondary | ICD-10-CM | POA: Diagnosis not present

## 2023-05-31 DIAGNOSIS — Z1231 Encounter for screening mammogram for malignant neoplasm of breast: Secondary | ICD-10-CM | POA: Diagnosis not present

## 2023-05-31 DIAGNOSIS — F17200 Nicotine dependence, unspecified, uncomplicated: Secondary | ICD-10-CM | POA: Diagnosis not present

## 2023-05-31 NOTE — Progress Notes (Signed)
Subjective:   [name] is a 78 y.o. who presents for a Medicare Wellness preventive visit.  Visit Complete: Virtual I connected with  [name] on 05/31/23 by a audio enabled telemedicine application and verified that I am speaking with the correct person using two identifiers.  Patient Location: Home  Provider Location: Office/Clinic  I discussed the limitations of evaluation and management by telemedicine. The patient expressed understanding and agreed to proceed.  Vital Signs: Because this visit was a virtual/telehealth visit, some criteria may be missing or patient reported. Any vitals not documented were not able to be obtained and vitals that have been documented are patient reported.  VideoDeclined- This patient declined Librarian, academic. Therefore the visit was completed with audio only.  AWV Questionnaire: No: Patient Medicare AWV questionnaire was not completed prior to this visit.  Cardiac Risk Factors include: advanced age (>48men, >42 women);smoking/ tobacco exposure;hypertension;dyslipidemia     Objective:    Today's Vitals   05/31/23 1305  Weight: 169 lb (76.7 kg)  Height: 5\' 4"  (1.626 m)   Body mass index is 29.01 kg/m.     05/31/2023    1:03 PM 06/22/2020    3:10 PM 05/01/2020    1:52 PM 10/03/2019    4:53 PM 11/28/2017    3:15 PM 08/20/2017    4:14 PM 09/02/2015   11:40 AM  Advanced Directives  Does Patient Have a Medical Advance Directive? No Yes No No No No No  Does patient want to make changes to medical advance directive?  No - Patient declined       Would patient like information on creating a medical advance directive? Yes (MAU/Ambulatory/Procedural Areas - Information given)    No - Patient declined Yes (ED - Information included in AVS) No - patient declined information    Current Medications (verified) Outpatient Encounter Medications as of 05/31/2023  Medication Sig   amLODipine (NORVASC) 10 MG tablet TAKE 1  TABLET BY MOUTH EVERY DAY   aspirin EC 81 MG tablet Take 1 tablet (81 mg total) by mouth daily.   atorvastatin (LIPITOR) 40 MG tablet Take 1 tablet (40 mg total) by mouth daily. Please call the office at [phone] to schedule an overdue appointment for future refills. Thank you. Final attempt.   gabapentin (NEURONTIN) 300 MG capsule TAKE 1 CAPSULE BY MOUTH EVERYDAY AT BEDTIME   hydrochlorothiazide (HYDRODIURIL) 25 MG tablet TAKE 1 TABLET (25 MG TOTAL) BY MOUTH DAILY.   labetalol (NORMODYNE) 300 MG tablet TAKE 1 TABLET BY MOUTH 2 TIMES DAILY.   omeprazole (PRILOSEC) 40 MG capsule TAKE 1 CAPSULE BY MOUTH EVERY DAY   Vitamin D, Ergocalciferol, (DRISDOL) 1.25 MG (50000 UNIT) CAPS capsule Take 1 capsule (50,000 Units total) by mouth every 7 (seven) days.   No facility-administered encounter medications on file as of 05/31/2023.    Allergies (verified) Benazepril hcl, Peanut-containing drug products, Shellfish allergy, and Yellow dyes (non-tartrazine)   History: Past Medical History:  Diagnosis Date   Absence of menstruation    amenorrhea   Angioneurotic edema not elsewhere classified    due to ACE-I   Breast cancer (HCC)    Breast disorder    breast cancer 2007   Cancer [facility]) 2007   Left Breast Cancer   Carotid stenosis    Carotid US (8/15):  RICA 40-59%; LICA 40-59% >>> FU 1 year   H/O: rheumatic fever    History of breast cancer 07/13/2015   HLD (hyperlipidemia)    Occlusion  and stenosis of carotid artery without mention of cerebral infarction    Other abnormal glucose    fasting hyperglycemia   Personal history of malignant neoplasm of breast    Personal history of radiation therapy 2007   Unspecified essential hypertension    Past Surgical History:  Procedure Laterality Date   APPENDECTOMY     BREAST BIOPSY Left 12/06/2005   malignant   BREAST LUMPECTOMY Left 01/08/2006   BREAST LUMPECTOMY Left 01/08/2006   COLONOSCOPY  2005   negative; Dr Loreta Ave   COLONOSCOPY N/A  09/02/2015   Procedure: COLONOSCOPY;  Surgeon: West Bali, MD;  Location: [facility];  Service: Endoscopy;  Laterality: N/A;  1215-moved to 1130 Ginger to notify pt   G0 P0     Dr. Wonda Olds   Family History  Problem Relation Age of Onset   Breast cancer Sister    Hypertension Other        some of Pts brothers and sisters   Heart attack Neg Hx    Stroke Neg Hx    Diabetes Neg Hx    Heart disease Neg Hx    Social History   Socioeconomic History   Marital status: Single    Spouse name: Not on file   Number of children: Not on file   Years of education: Not on file   Highest education level: Not on file  Occupational History   Not on file  Tobacco Use   Smoking status: Every Day    Current packs/day: 0.50    Average packs/day: 0.5 packs/day for 48.0 years (24.0 ttl pk-yrs)    Types: Cigarettes    Passive exposure: Current   Smokeless tobacco: Never   Tobacco comments:    Smokes daily.    Vaping Use   Vaping status: Never Used  Substance and Sexual Activity   Alcohol use: Yes    Alcohol/week: 1.0 standard drink of alcohol    Types: 1 Standard drinks or equivalent per week    Comment: occ   Drug use: No   Sexual activity: Yes    Birth control/protection: Post-menopausal  Other Topics Concern   Not on file  Social History Narrative   Single, no children.   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    Social Drivers of Health   Financial Resource Strain: Medium Risk (05/31/2023)   Overall Financial Resource Strain (CARDIA)    Difficulty of Paying Living Expenses: Somewhat hard  Food Insecurity: No Food Insecurity (05/31/2023)   Hunger Vital Sign    Worried About Running Out of Food in the Last Year: Never true    Ran Out of Food in the Last Year: Never true  Transportation Needs: No Transportation Needs (05/31/2023)   PRAPARE - Administrator, Civil Service (Medical): No    Lack of Transportation (Non-Medical): No  Physical Activity: Sufficiently  Active (05/31/2023)   Exercise Vital Sign    Days of Exercise per Week: 5 days    Minutes of Exercise per Session: 60 min  Stress: No Stress Concern Present (05/31/2023)   [employer] of Occupational Health - Occupational Stress Questionnaire    Feeling of Stress : Not at all  Social Connections: Moderately Isolated (05/31/2023)   Social Connection and Isolation Panel [NHANES]    Frequency of Communication with Friends and Family: More than three times a week    Frequency of Social Gatherings with Friends and Family: More than three times a week  Attends Religious Services: 1 to 4 times per year    Active Member of Clubs or Organizations: No    Attends Banker Meetings: Never    Marital Status: Never married    Tobacco Counseling: Lung Cancer Screening test ordered on 05/31/2023. Ready to quit: No Counseling given: Yes Tobacco comments: Smokes daily.      Clinical Intake:  Pre-visit preparation completed: Yes  Pain : No/denies pain     BMI - recorded: 29.01 Nutritional Status: BMI 25 -29 Overweight Nutritional Risks: None Diabetes: No  How often do you need to have someone help you when you read instructions, pamphlets, or other written materials from your doctor or pharmacy?: 1 - Never  Interpreter Needed?: No  Information entered by :: Hassell Halim, CMA   Activities of Daily Living Completed 05/31/2023    05/31/2023    1:09 PM  In your present state of health, do you have any difficulty performing the following activities:  Hearing? 0  Vision? 0  Difficulty concentrating or making decisions? 0  Walking or climbing stairs? 0  Dressing or bathing? 0  Doing errands, shopping? 0  Preparing Food and eating ? N  Using the Toilet? N  In the past six months, have you accidently leaked urine? N  Do you have problems with loss of bowel control? N  Managing your Medications? N  Managing your Finances? N  Housekeeping or managing your Housekeeping? N     Patient Care Team: Pincus Sanes, MD as PCP - General (Internal Medicine) Tonny Bollman, MD as PCP - Cardiology (Cardiology) Kathyrn Sheriff, [facility] (Inactive) as Pharmacist (Pharmacist) Kathyrn Sheriff, [facility] (Inactive) as Pharmacist (Pharmacist) Dimitri Ped, MD as Consulting Physician (Ophthalmology) Maxie Better, MD as Consulting Physician (Obstetrics and Gynecology)  Indicate any recent Medical Services you may have received from other than [facility] providers in the past year (date may be approximate).     Assessment:   This is a routine wellness examination for [name].  Hearing/Vision screen Hearing Screening - Comments:: Denies hearing difficulties   Vision Screening - Comments:: Wears eyeglasses for reading only - does not see an Opthalmologist   Goals Addressed               This Visit's Progress     Patient Stated (pt-stated)        Patient stated that she plans to continue exercising and stay active.       Depression Screen Completed 05/31/2023    05/31/2023    1:17 PM 07/09/2022    3:43 PM 05/04/2022    1:27 PM 06/22/2020    3:09 PM 06/05/2019   10:41 AM 05/21/2018    2:11 PM 08/20/2017    4:42 PM  PHQ 2/9 Scores  PHQ - 2 Score 0 0 0 0 0 0 0  PHQ- 9 Score  0     0    Fall Risk Completed 05/31/2023    05/31/2023    1:10 PM 07/09/2022    3:43 PM 07/09/2022    3:39 PM 05/04/2022    1:26 PM 06/22/2020    3:10 PM  Fall Risk   Falls in the past year? 0 0 0 0 0  Number falls in past yr: 0 0 0 0 0  Injury with Fall? 0 0 0 0 0  Risk for fall due to : No Fall Risks No Fall Risks No Fall Risks No Fall Risks No Fall Risks  Follow up Falls prevention  discussed;Falls evaluation completed Falls evaluation completed Falls evaluation completed Falls evaluation completed Falls evaluation completed    MEDICARE RISK AT HOME: Completed 05/31/2023 Medicare Risk at Home Any stairs in or around the home?: No If so, are there any without handrails?:  No Home free of loose throw rugs in walkways, pet beds, electrical cords, etc?: Yes Adequate lighting in your home to reduce risk of falls?: Yes Life alert?: No Use of a cane, walker or w/c?: No Grab bars in the bathroom?: Yes Shower chair or bench in shower?: No Elevated toilet seat or a handicapped toilet?: Yes  TIMED UP AND GO:  Was the test performed?  No  Cognitive Function: 6CIT completed        05/31/2023    1:11 PM  6CIT Screen  What Year? 0 points  What month? 0 points  What time? 0 points  Count back from 20 0 points  Months in reverse 0 points  Repeat phrase 0 points  Total Score 0 points    Immunizations Immunization History  Administered Date(s) Administered   Moderna Sars-Covid-2 Vaccination 06/20/2019, 07/18/2019, 02/25/2020    Screening Tests Health Maintenance  Topic Date Due   Pneumonia Vaccine 60+ Years old (1 of 2 - PCV) Never done   DTaP/Tdap/Td (1 - Tdap) Never done   Zoster Vaccines- Shingrix (1 of 2) Never done   Lung Cancer Screening  08/13/2008   COVID-19 Vaccine (4 - 2024-25 season) 12/09/2022   DEXA SCAN  06/12/2023   Medicare Annual Wellness (AWV)  05/30/2024   Colonoscopy  09/01/2025   Hepatitis C Screening  Completed   HPV VACCINES  Aged Out   INFLUENZA VACCINE  Discontinued    Health Maintenance  Health Maintenance Due  Topic Date Due   Pneumonia Vaccine 64+ Years old (1 of 2 - PCV) Never done   DTaP/Tdap/Td (1 - Tdap) Never done   Zoster Vaccines- Shingrix (1 of 2) Never done   Lung Cancer Screening  08/13/2008   COVID-19 Vaccine (4 - 2024-25 season) 12/09/2022   Health Maintenance Items Addressed: 05/31/2023.  Patient declines the Pneumonia, Tdap, and COVID vaccines. Next appt w/PCP in 06/2023 & will consider to get then.   Additional Screening:  Vision Screening: Recommended annual ophthalmology exams for early detection of glaucoma and other disorders of the eye.Pt does not have an Ophthalmologist.  Dental Screening:  Recommended annual dental exams for proper oral hygiene  Mammogram Status: Mammogram ordered for 2025 (pt did not have done in 2024).  DEXA Status:  DEXA scan ordered for 2025.  Completed in 06/12/2018.  Community Resource Referral / Chronic Care Management: CRR required this visit?  Yes   CCM required this visit?  No     Plan:     I have personally reviewed and noted the following in the patient's chart:   Medical and social history Use of alcohol, tobacco or illicit drugs  Current medications and supplements including opioid prescriptions. Patient is not currently taking opioid prescriptions. Functional ability and status Nutritional status Physical activity Advanced directives List of other physicians Hospitalizations, surgeries, and ER visits in previous 12 months Vitals Screenings to include cognitive, depression, and falls Referrals and appointments  In addition, I have reviewed and discussed with patient certain preventive protocols, quality metrics, and best practice recommendations. A written personalized care plan for preventive services as well as general preventive health recommendations were provided to patient.     Darreld Mclean, CMA   05/31/2023   After  Visit Summary: (MyChart) Due to this being a telephonic visit, the after visit summary with patients personalized plan was offered to patient via MyChart   Notes:  Ordered Mammogram, Lung Cancer Screening test, and DEXA scan for 2025.   Pt declines Pneumonia, COVID, and Tdap vaccines.

## 2023-05-31 NOTE — Patient Instructions (Addendum)
[name] , Thank you for taking time to come for your Medicare Wellness Visit. I appreciate your ongoing commitment to your health goals. Please review the following plan we discussed and let me know if I can assist you in the future.   Referrals/Orders/Follow-Ups/Clinician Recommendations: Aim for 30 minutes of exercise or brisk walking, 6-8 glasses of water, and 5 servings of fruits and vegetables each day. Patient advised to get the Pneumonia, COVID, and Tdap vaccines in 2025.  Lung Cancer Screening test, Mammogram, and DEXA scan ordered for 2025.  This is a list of the screening recommended for you and due dates:  Health Maintenance  Topic Date Due   Pneumonia Vaccine (1 of 2 - PCV) Never done   DTaP/Tdap/Td vaccine (1 - Tdap) Never done   Zoster (Shingles) Vaccine (1 of 2) Never done   Screening for Lung Cancer  08/13/2008   COVID-19 Vaccine (4 - 2024-25 season) 12/09/2022   DEXA scan (bone density measurement)  06/12/2023   Medicare Annual Wellness Visit  05/30/2024   Colon Cancer Screening  09/01/2025   Hepatitis C Screening  Completed   HPV Vaccine  Aged Out   Flu Shot  Discontinued    Advanced directives: (Provided) Advance directive discussed with you today. I have provided a copy for you to complete at home and have notarized. Once this is complete, please bring a copy in to our office so we can scan it into your chart.   Next Medicare Annual Wellness Visit scheduled for next year: Yes - 2026

## 2023-06-16 ENCOUNTER — Encounter: Payer: Self-pay | Admitting: Internal Medicine

## 2023-06-16 DIAGNOSIS — N183 Chronic kidney disease, stage 3 unspecified: Secondary | ICD-10-CM | POA: Insufficient documentation

## 2023-06-16 DIAGNOSIS — N1832 Chronic kidney disease, stage 3b: Secondary | ICD-10-CM | POA: Insufficient documentation

## 2023-06-16 NOTE — Patient Instructions (Addendum)
 Call and schedule your mammogram and bone density tests - [facility] Schedule an appointment by calling [phone]    Blood work was ordered.       Medications changes include :   None    A referral was ordered and someone will call you to schedule an appointment.     Return in about 6 months (around 12/18/2023) for follow up.    Health Maintenance, Female Adopting a healthy lifestyle and getting preventive care are important in promoting health and wellness. Ask your health care provider about: The right schedule for you to have regular tests and exams. Things you can do on your own to prevent diseases and keep yourself healthy. What should I know about diet, weight, and exercise? Eat a healthy diet  Eat a diet that includes plenty of vegetables, fruits, low-fat dairy products, and lean protein. Do not eat a lot of foods that are high in solid fats, added sugars, or sodium. Maintain a healthy weight Body mass index (BMI) is used to identify weight problems. It estimates body fat based on height and weight. Your health care provider can help determine your BMI and help you achieve or maintain a healthy weight. Get regular exercise Get regular exercise. This is one of the most important things you can do for your health. Most adults should: Exercise for at least 150 minutes each week. The exercise should increase your heart rate and make you sweat (moderate-intensity exercise). Do strengthening exercises at least twice a week. This is in addition to the moderate-intensity exercise. Spend less time sitting. Even light physical activity can be beneficial. Watch cholesterol and blood lipids Have your blood tested for lipids and cholesterol at 76 years of age, then have this test every 5 years. Have your cholesterol levels checked more often if: Your lipid or cholesterol levels are high. You are older than 76 years of age. You are at high risk  for heart disease. What should I know about cancer screening? Depending on your health history and family history, you may need to have cancer screening at various ages. This may include screening for: Breast cancer. Cervical cancer. Colorectal cancer. Skin cancer. Lung cancer. What should I know about heart disease, diabetes, and high blood pressure? Blood pressure and heart disease High blood pressure causes heart disease and increases the risk of stroke. This is more likely to develop in people who have high blood pressure readings or are overweight. Have your blood pressure checked: Every 3-5 years if you are 71-73 years of age. Every year if you are 34 years old or older. Diabetes Have regular diabetes screenings. This checks your fasting blood sugar level. Have the screening done: Once every three years after age 71 if you are at a normal weight and have a low risk for diabetes. More often and at a younger age if you are overweight or have a high risk for diabetes. What should I know about preventing infection? Hepatitis B If you have a higher risk for hepatitis B, you should be screened for this virus. Talk with your health care provider to find out if you are at risk for hepatitis B infection. Hepatitis C Testing is recommended for: Everyone born from [birth year] through 1963. Anyone with known risk factors for hepatitis C. Sexually transmitted infections (STIs) Get screened for STIs, including gonorrhea and chlamydia, if: You are sexually active and are younger than 76 years of age. You are older than 76  years of age and your health care provider tells you that you are at risk for this type of infection. Your sexual activity has changed since you were last screened, and you are at increased risk for chlamydia or gonorrhea. Ask your health care provider if you are at risk. Ask your health care provider about whether you are at high risk for HIV. Your health care provider may recommend  a prescription medicine to help prevent HIV infection. If you choose to take medicine to prevent HIV, you should first get tested for HIV. You should then be tested every 3 months for as long as you are taking the medicine. Pregnancy If you are about to stop having your period (premenopausal) and you may become pregnant, seek counseling before you get pregnant. Take 400 to 800 micrograms (mcg) of folic acid every day if you become pregnant. Ask for birth control (contraception) if you want to prevent pregnancy. Osteoporosis and menopause Osteoporosis is a disease in which the bones lose minerals and strength with aging. This can result in bone fractures. If you are 11 years old or older, or if you are at risk for osteoporosis and fractures, ask your health care provider if you should: Be screened for bone loss. Take a calcium or vitamin D supplement to lower your risk of fractures. Be given hormone replacement therapy (HRT) to treat symptoms of menopause. Follow these instructions at home: Alcohol use Do not drink alcohol if: Your health care provider tells you not to drink. You are pregnant, may be pregnant, or are planning to become pregnant. If you drink alcohol: Limit how much you have to: 0-1 drink a day. Know how much alcohol is in your drink. In the [place] one drink equals one 12 oz bottle of beer (355 mL), one 5 oz glass of wine (148 mL), or one 1 oz glass of hard liquor (44 mL). Lifestyle Do not use any products that contain nicotine or tobacco. These products include cigarettes, chewing tobacco, and vaping devices, such as e-cigarettes. If you need help quitting, ask your health care provider. Do not use street drugs. Do not share needles. Ask your health care provider for help if you need support or information about quitting drugs. General instructions Schedule regular health, dental, and eye exams. Stay current with your vaccines. Tell your health care provider if: You often  feel depressed. You have ever been abused or do not feel safe at home. Summary Adopting a healthy lifestyle and getting preventive care are important in promoting health and wellness. Follow your health care provider's instructions about healthy diet, exercising, and getting tested or screened for diseases. Follow your health care provider's instructions on monitoring your cholesterol and blood pressure. This information is not intended to replace advice given to you by your health care provider. Make sure you discuss any questions you have with your health care provider. Document Revised: 08/15/2020 Document Reviewed: 08/15/2020 Elsevier Patient Education  2024 [employer].

## 2023-06-16 NOTE — Progress Notes (Unsigned)
 Subjective:    Patient ID: [name], female    DOB: 05/25/1945, 77 y.o.   MRN: [id number]      HPI [name] is here for a Physical exam and her chronic medical problems.   Ckd new - ? Cause    Medications and allergies reviewed with patient and updated if appropriate.  Current Outpatient Medications on File Prior to Visit  Medication Sig Dispense Refill   amLODipine (NORVASC) 10 MG tablet TAKE 1 TABLET BY MOUTH EVERY DAY 90 tablet 2   aspirin EC 81 MG tablet Take 1 tablet (81 mg total) by mouth daily. 90 tablet 3   atorvastatin (LIPITOR) 40 MG tablet Take 1 tablet (40 mg total) by mouth daily. Please call the office at [phone] to schedule an overdue appointment for future refills. Thank you. Final attempt. 15 tablet 0   gabapentin (NEURONTIN) 300 MG capsule TAKE 1 CAPSULE BY MOUTH EVERYDAY AT BEDTIME 90 capsule 1   hydrochlorothiazide (HYDRODIURIL) 25 MG tablet TAKE 1 TABLET (25 MG TOTAL) BY MOUTH DAILY. 15 tablet 0   labetalol (NORMODYNE) 300 MG tablet TAKE 1 TABLET BY MOUTH 2 TIMES DAILY. 180 tablet 2   omeprazole (PRILOSEC) 40 MG capsule TAKE 1 CAPSULE BY MOUTH EVERY DAY 90 capsule 0   Vitamin D, Ergocalciferol, (DRISDOL) 1.25 MG (50000 UNIT) CAPS capsule Take 1 capsule (50,000 Units total) by mouth every 7 (seven) days. 8 capsule 0   No current facility-administered medications on file prior to visit.    Review of Systems     Objective:  There were no vitals filed for this visit. There were no vitals filed for this visit. There is no height or weight on file to calculate BMI.  BP Readings from Last 3 Encounters:  07/09/22 120/82  05/04/22 138/82  02/14/22 (!) 128/58    Wt Readings from Last 3 Encounters:  05/31/23 169 lb (76.7 kg)  07/09/22 169 lb (76.7 kg)  05/04/22 167 lb (75.8 kg)       Physical Exam Constitutional: She appears well-developed and well-nourished. No distress.  HENT:  Head: Normocephalic and atraumatic.  Right Ear: External ear  normal. Normal ear canal and TM Left Ear: External ear normal.  Normal ear canal and TM Mouth/Throat: Oropharynx is clear and moist.  Eyes: Conjunctivae normal.  Neck: Neck supple. No tracheal deviation present. No thyromegaly present.  No carotid bruit  Cardiovascular: Normal rate, regular rhythm and normal heart sounds.   No murmur heard.  No edema. Pulmonary/Chest: Effort normal and breath sounds normal. No respiratory distress. She has no wheezes. She has no rales.  Breast: deferred   Abdominal: Soft. She exhibits no distension. There is no tenderness.  Lymphadenopathy: She has no cervical adenopathy.  Skin: Skin is warm and dry. She is not diaphoretic.  Psychiatric: She has a normal mood and affect. Her behavior is normal.     Lab Results  Component Value Date   WBC 9.4 05/04/2022   HGB 12.1 05/04/2022   HCT 36.5 05/04/2022   PLT 279.0 05/04/2022   GLUCOSE 90 05/04/2022   CHOL 157 12/05/2021   TRIG 104.0 12/05/2021   HDL 50.70 12/05/2021   LDLDIRECT 141.8 07/31/2012   LDLCALC 86 12/05/2021   ALT 9 05/04/2022   AST 15 05/04/2022   NA 140 05/04/2022   K 4.1 05/04/2022   CL 103 05/04/2022   CREATININE 1.31 (H) 05/04/2022   BUN 26 (H) 05/04/2022   CO2 26 05/04/2022   TSH 1.23 05/04/2022  HGBA1C 5.9 05/04/2022         Assessment & Plan:   Physical exam: Screening blood work  ordered Exercise   Weight   Substance abuse  none   Reviewed recommended immunizations.   Health Maintenance  Topic Date Due   Pneumonia Vaccine 50+ Years old (1 of 2 - PCV) Never done   DTaP/Tdap/Td (1 - Tdap) Never done   Zoster Vaccines- Shingrix (1 of 2) Never done   Lung Cancer Screening  08/13/2008   COVID-19 Vaccine (4 - 2024-25 season) 12/09/2022   DEXA SCAN  06/12/2023   Medicare Annual Wellness (AWV)  05/30/2024   Hepatitis C Screening  Completed   HPV VACCINES  Aged Out   INFLUENZA VACCINE  Discontinued   Colonoscopy  Discontinued          See Problem List  for Assessment and Plan of chronic medical problems.

## 2023-06-17 ENCOUNTER — Ambulatory Visit (INDEPENDENT_AMBULATORY_CARE_PROVIDER_SITE_OTHER): Payer: Medicare HMO | Admitting: Internal Medicine

## 2023-06-17 VITALS — BP 146/90 | HR 68 | Temp 97.6°F | Ht 64.0 in | Wt 162.0 lb

## 2023-06-17 DIAGNOSIS — Z0000 Encounter for general adult medical examination without abnormal findings: Secondary | ICD-10-CM

## 2023-06-17 DIAGNOSIS — E782 Mixed hyperlipidemia: Secondary | ICD-10-CM | POA: Diagnosis not present

## 2023-06-17 DIAGNOSIS — G629 Polyneuropathy, unspecified: Secondary | ICD-10-CM

## 2023-06-17 DIAGNOSIS — R7303 Prediabetes: Secondary | ICD-10-CM | POA: Diagnosis not present

## 2023-06-17 DIAGNOSIS — M85839 Other specified disorders of bone density and structure, unspecified forearm: Secondary | ICD-10-CM

## 2023-06-17 DIAGNOSIS — K219 Gastro-esophageal reflux disease without esophagitis: Secondary | ICD-10-CM | POA: Diagnosis not present

## 2023-06-17 DIAGNOSIS — N1832 Chronic kidney disease, stage 3b: Secondary | ICD-10-CM

## 2023-06-17 DIAGNOSIS — E559 Vitamin D deficiency, unspecified: Secondary | ICD-10-CM

## 2023-06-17 DIAGNOSIS — I10 Essential (primary) hypertension: Secondary | ICD-10-CM | POA: Diagnosis not present

## 2023-06-17 DIAGNOSIS — R7989 Other specified abnormal findings of blood chemistry: Secondary | ICD-10-CM | POA: Diagnosis not present

## 2023-06-17 NOTE — Assessment & Plan Note (Signed)
 Chronic Continue vitamin D supplementation Check vitamin D level

## 2023-06-17 NOTE — Assessment & Plan Note (Signed)
 Chronic Lab Results  Component Value Date   HGBA1C 5.9 05/04/2022    Check A1c Low sugar/carbohydrate diet

## 2023-06-17 NOTE — Assessment & Plan Note (Signed)
 Chronic DEXA due-advised her to schedule She is very active-does a lot of walking at work Encouraged taking calcium and vitamin D daily

## 2023-06-17 NOTE — Assessment & Plan Note (Signed)
Chronic ?Check B12 level ?

## 2023-06-17 NOTE — Assessment & Plan Note (Signed)
 Chronic GERD controlled Continue omeprazole 40 mg daily

## 2023-06-17 NOTE — Assessment & Plan Note (Signed)
Chronic Taking gabapentin 300 mg nightly prn

## 2023-06-17 NOTE — Assessment & Plan Note (Signed)
 Chronic Blood pressure not ideally controlled-likely because she forgot her medication this morning Stressed compliance on a daily basis and the importance of keeping her BP well-controlled, especially to preserve her kidney function Continue amlodipine 10 mg daily, HCTZ 25 mg daily, labetalol 300 mg twice daily

## 2023-06-17 NOTE — Assessment & Plan Note (Signed)
Chronic Cholesterol adequately controlled Continue atorvastatin 40 mg daily Encouraged regular exercise, heart healthy diet 

## 2023-06-17 NOTE — Assessment & Plan Note (Signed)
 Chronic Discussed with her that her kidney function has been decreased twice and she does have chronic kidney disease Encouraged increased fluids Stressed the importance of keeping her blood pressure well-controlled and not forgetting her blood pressure medication CMP, CBC

## 2023-06-18 ENCOUNTER — Telehealth: Payer: Self-pay | Admitting: Internal Medicine

## 2023-06-18 NOTE — Telephone Encounter (Signed)
 Attempted to reach patient today to verify this is something she has requested.  Until I hear back from her we will not move forward with completing paperwork.

## 2023-06-18 NOTE — Telephone Encounter (Signed)
 Copied from CRM [id number]. Topic: General - Other >> Jun 18, 2023 10:42 AM Truddie Crumble wrote: Reason for CRM: michelle from [employer] called stating she is checking on a laboratory requisition CB [phone]  ---  PW was received in faxes today and placed in provider box.

## 2023-06-19 NOTE — Telephone Encounter (Signed)
 Copied from CRM [id number]. Topic: General - Other >> Jun 19, 2023  1:29 PM Fredrich Romans wrote: Reason for CRM: [employer] called in stating that there is an auto consent on the first page of documents. Patient doesn't need to sign

## 2023-06-21 ENCOUNTER — Other Ambulatory Visit: Payer: Self-pay | Admitting: Cardiovascular Disease

## 2023-06-21 NOTE — Telephone Encounter (Signed)
Form placed in Dr. Lawerance Bach folder to sign and complete

## 2023-06-21 NOTE — Telephone Encounter (Signed)
 Dr. Earmon Phoenix pt. She is passed her third attempt. Does Dr. Excell Seltzer want to refill? Please advise

## 2023-06-26 NOTE — Telephone Encounter (Signed)
 Copied from CRM [id number]. Topic: General - Other >> Jun 19, 2023  1:29 PM Fredrich Romans wrote: Reason for CRM: [employer] called in stating that there is an auto consent on the first page of documents. Patient doesn't need to sign >> Jun 26, 2023 11:52 AM Shelbie Proctor wrote: Marcelino Duster from [employer] [phone] checking on faxed 06/17/23 a 7 pages laboratory request for cancer genetics testing. Please call back on the status.

## 2023-06-27 NOTE — Telephone Encounter (Signed)
 Spoke with patient today.  She does not want to move forward with testing. I reached out to [employer] and spoke with Marchelle Folks.  Information relayed and she said she would close the case.

## 2023-07-01 NOTE — Progress Notes (Deleted)
 Cardiology Office Note    Patient Name: [name] Date of Encounter: 07/01/2023  Primary Care Provider:  Pincus Sanes, MD Primary Cardiologist:  Kimberly Bollman, MD Primary Electrophysiologist: None   Past Medical History    Past Medical History:  Diagnosis Date   Absence of menstruation    amenorrhea   Angioneurotic edema not elsewhere classified    due to ACE-I   Breast cancer [facility])    Breast disorder    breast cancer 2007   Cancer [facility]) 2007   Left Breast Cancer   Carotid stenosis    Carotid US (8/15):  RICA 40-59%; LICA 40-59% >>> FU 1 year   H/O: rheumatic fever    History of breast cancer 07/13/2015   HLD (hyperlipidemia)    Occlusion and stenosis of carotid artery without mention of cerebral infarction    Other abnormal glucose    fasting hyperglycemia   Personal history of malignant neoplasm of breast    Personal history of radiation therapy 2007   Unspecified essential hypertension     History of Present Illness  [name] is a 78 y.o. female with a PMH of carotid stenosis, RICA 60 to 79% and LICA 40-59%), HLD, GERD, HTN, breast CA, history of rheumatic fever, tobacco abuse who presents today for annual follow-up.  [name] has been followed by [name] for management of carotid artery disease.  She has completed annual ultrasounds with last being 12/07/2021 that showed less than 40% bilateral ICA with medical therapy recommended.  She has a history of HTN as well as breast cancer.  She was last seen by [name] on 11/2021 for follow-up and reported no change in symptoms but continued to smoke cigarettes.  She was noted to have uncontrolled BP and was on amlodipine but stopped taking hydralazine HCTZ and labetalol.  Blood pressures were 215/98 and patient had no symptoms of headache or visual symptoms.  She was restarted on HCTZ and labetalol and was advised to pick up her medications.  She was referred to the Pharm.D. for further evaluation.  She was seen by  pharmacy on 02/14/2022 and advised to continue current therapy and advised to not add back hydralazine.   Patient denies chest pain, palpitations, dyspnea, PND, orthopnea, nausea, vomiting, dizziness, syncope, edema, weight gain, or early satiety.   Discussed the use of AI scribe software for clinical note transcription with the patient, who gave verbal consent to proceed.  History of Present Illness    ***Notes: -Last ischemic evaluation:  Review of Systems  Please see the history of present illness.    All other systems reviewed and are otherwise negative except as noted above.  Physical Exam    Wt Readings from Last 3 Encounters:  06/17/23 162 lb (73.5 kg)  05/31/23 169 lb (76.7 kg)  07/09/22 169 lb (76.7 kg)   WU:[id number] were no vitals filed for this visit.,There is no height or weight on file to calculate BMI. GEN: Well nourished, well developed in no acute distress Neck: No JVD; No carotid bruits Pulmonary: Clear to auscultation without rales, wheezing or rhonchi  Cardiovascular: Normal rate. Regular rhythm. Normal S1. Normal S2.   Murmurs: There is no murmur.  ABDOMEN: Soft, non-tender, non-distended EXTREMITIES:  No edema; No deformity   EKG/LABS/ Recent Cardiac Studies   ECG personally reviewed by me today - ***  Risk Assessment/Calculations:   {Does this patient have ATRIAL FIBRILLATION?[phone]}      Lab Results  Component Value Date  WBC 9.4 05/04/2022   HGB 12.1 05/04/2022   HCT 36.5 05/04/2022   MCV 85.7 05/04/2022   PLT 279.0 05/04/2022   Lab Results  Component Value Date   CREATININE 1.31 (H) 05/04/2022   BUN 26 (H) 05/04/2022   NA 140 05/04/2022   K 4.1 05/04/2022   CL 103 05/04/2022   CO2 26 05/04/2022   Lab Results  Component Value Date   CHOL 157 12/05/2021   HDL 50.70 12/05/2021   LDLCALC 86 12/05/2021   LDLDIRECT 141.8 07/31/2012   TRIG 104.0 12/05/2021   CHOLHDL 3 12/05/2021    Lab Results  Component Value Date   HGBA1C 5.9  05/04/2022   Assessment & Plan    1.  History of carotid stenosis: -Patient's last carotid ultrasound completed 11/2021 with bilateral 40% ICA is noted and recommendation for medical therapy clinical follow-up.  2.  Essential hypertension: -Patient's blood pressure today was***  3.  Hyperlipidemia: -Patient's last LDL cholesterol was 86 -Continue***  4.  Tobacco abuse: -Reports***      Disposition: Follow-up with Kimberly Bollman, MD or APP in *** months {Are you ordering a CV Procedure (e.g. stress test, cath, DCCV, TEE, etc)?   Press F2        :[id number]}   Signed, [name], Kimberly Rains, NP 07/01/2023, 6:34 PM [facility]

## 2023-07-02 ENCOUNTER — Ambulatory Visit: Attending: Nurse Practitioner | Admitting: Nurse Practitioner

## 2023-07-02 DIAGNOSIS — E782 Mixed hyperlipidemia: Secondary | ICD-10-CM

## 2023-07-02 DIAGNOSIS — I10 Essential (primary) hypertension: Secondary | ICD-10-CM

## 2023-07-02 DIAGNOSIS — I6523 Occlusion and stenosis of bilateral carotid arteries: Secondary | ICD-10-CM

## 2023-07-02 DIAGNOSIS — Z720 Tobacco use: Secondary | ICD-10-CM

## 2023-07-03 ENCOUNTER — Encounter: Payer: Self-pay | Admitting: Nurse Practitioner

## 2023-07-17 ENCOUNTER — Other Ambulatory Visit

## 2023-07-25 ENCOUNTER — Ambulatory Visit (INDEPENDENT_AMBULATORY_CARE_PROVIDER_SITE_OTHER)
Admission: RE | Admit: 2023-07-25 | Discharge: 2023-07-25 | Disposition: A | Discharge status: Home / Self Care | Source: Ambulatory Visit | Attending: Internal Medicine | Admitting: Internal Medicine

## 2023-07-25 DIAGNOSIS — Z780 Asymptomatic menopausal state: Secondary | ICD-10-CM | POA: Diagnosis not present

## 2023-07-25 DIAGNOSIS — M8580 Other specified disorders of bone density and structure, unspecified site: Secondary | ICD-10-CM | POA: Diagnosis not present

## 2023-07-25 DIAGNOSIS — Z0000 Encounter for general adult medical examination without abnormal findings: Secondary | ICD-10-CM

## 2023-07-28 ENCOUNTER — Encounter: Payer: Self-pay | Admitting: Internal Medicine

## 2023-08-14 ENCOUNTER — Telehealth: Payer: Self-pay

## 2023-08-14 DIAGNOSIS — I6523 Occlusion and stenosis of bilateral carotid arteries: Secondary | ICD-10-CM

## 2023-08-14 MED ORDER — ATORVASTATIN CALCIUM 40 MG PO TABS: 40.0000 mg | ORAL_TABLET | Freq: Every day | ORAL | 1 refills | Status: DC

## 2023-08-14 NOTE — Telephone Encounter (Signed)
 Contacted patient to inform her the atorvastatin  was sent to the pharmacy. Patient to come for labs towards the end of the first week of June.  Abelina Abide, PharmD PGY1 Pharmacy Resident 08/14/2023 4:08 PM

## 2023-08-14 NOTE — Telephone Encounter (Signed)
 This patient is appearing on a report for being at risk of failing the adherence measure for cholesterol (statin) medications this calendar year.   Medication: atorvastatin  40 mg daily Last fill date: 05/21/23 for 15 day supply, 0 refills left *Prescribed by Dr. 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  This affects patient's ability to schedule follow-up appointments for refills. Patient to call cardiology today to schedule appointment for further atorvastatin  refills.  Abelina Abide, PharmD PGY1 Pharmacy Resident 08/14/2023 9:31 AM

## 2023-08-14 NOTE — Telephone Encounter (Signed)
 Yes I can refill atorvastatin .  Would recommend ideally she wait 6 weeks and get all the blood work done at once so we can see where her cholesterol is.  Atorvastatin  sent to pharmacy

## 2023-08-15 ENCOUNTER — Other Ambulatory Visit: Payer: Self-pay | Admitting: Internal Medicine

## 2023-08-15 ENCOUNTER — Ambulatory Visit
Admission: RE | Admit: 2023-08-15 | Discharge: 2023-08-15 | Disposition: A | Discharge status: Home / Self Care | Source: Ambulatory Visit | Attending: Internal Medicine | Admitting: Internal Medicine

## 2023-08-15 DIAGNOSIS — N63 Unspecified lump in unspecified breast: Secondary | ICD-10-CM

## 2023-08-15 DIAGNOSIS — N6324 Unspecified lump in the left breast, lower inner quadrant: Secondary | ICD-10-CM | POA: Diagnosis not present

## 2023-08-15 DIAGNOSIS — N6315 Unspecified lump in the right breast, overlapping quadrants: Secondary | ICD-10-CM | POA: Diagnosis not present

## 2023-08-15 DIAGNOSIS — Z9889 Other specified postprocedural states: Secondary | ICD-10-CM

## 2023-08-15 DIAGNOSIS — N6320 Unspecified lump in the left breast, unspecified quadrant: Secondary | ICD-10-CM

## 2023-08-15 DIAGNOSIS — R921 Mammographic calcification found on diagnostic imaging of breast: Secondary | ICD-10-CM | POA: Diagnosis not present

## 2023-08-20 ENCOUNTER — Ambulatory Visit
Admission: RE | Admit: 2023-08-20 | Discharge: 2023-08-20 | Disposition: A | Discharge status: Home / Self Care | Source: Ambulatory Visit | Attending: Internal Medicine

## 2023-08-20 ENCOUNTER — Ambulatory Visit
Admission: RE | Admit: 2023-08-20 | Discharge: 2023-08-20 | Disposition: A | Discharge status: Home / Self Care | Source: Ambulatory Visit | Attending: Internal Medicine | Admitting: Internal Medicine

## 2023-08-20 DIAGNOSIS — N6324 Unspecified lump in the left breast, lower inner quadrant: Secondary | ICD-10-CM | POA: Diagnosis not present

## 2023-08-20 DIAGNOSIS — R921 Mammographic calcification found on diagnostic imaging of breast: Secondary | ICD-10-CM | POA: Diagnosis not present

## 2023-08-20 DIAGNOSIS — N6320 Unspecified lump in the left breast, unspecified quadrant: Secondary | ICD-10-CM

## 2023-08-20 DIAGNOSIS — C50312 Malignant neoplasm of lower-inner quadrant of left female breast: Secondary | ICD-10-CM | POA: Diagnosis not present

## 2023-08-20 DIAGNOSIS — Z853 Personal history of malignant neoplasm of breast: Secondary | ICD-10-CM | POA: Diagnosis not present

## 2023-08-20 HISTORY — PX: BREAST BIOPSY: SHX20

## 2023-08-21 LAB — SURGICAL PATHOLOGY

## 2023-08-22 ENCOUNTER — Telehealth: Payer: Self-pay | Admitting: *Deleted

## 2023-08-22 NOTE — Telephone Encounter (Signed)
 Spoke to patient to confirm upcoming morning [facility] clinic appointment on 5/21, paperwork will be sent via mail.  Gave location and time, also informed patient that the surgeon's office would be calling as well to get information from them similar to the packet that they will be receiving so make sure to do both.  Reminded patient that all providers will be coming to the clinic to see them HERE and if they had any questions to not hesitate to reach back out to myself or their navigators.

## 2023-08-22 NOTE — Telephone Encounter (Signed)
 LVM to patient in reference to upcoming [facility] appointment on 5/21, left my contact to call back

## 2023-08-26 ENCOUNTER — Encounter: Payer: Self-pay | Admitting: *Deleted

## 2023-08-26 DIAGNOSIS — C50312 Malignant neoplasm of lower-inner quadrant of left female breast: Secondary | ICD-10-CM | POA: Insufficient documentation

## 2023-08-27 NOTE — Progress Notes (Signed)
 Radiation Oncology         [phone] ________________________________  Multidisciplinary Breast Oncology Clinic [facility]) Initial Outpatient Consultation  Name: [name] MRN: [id number]  Date: 08/28/2023  DOB: 1945/08/28  WU:[id number], Beckey Bourgeois, MD  Sim Dryer, MD   REFERRING PHYSICIAN: Sim Dryer, MD  DIAGNOSIS: The encounter diagnosis was Malignant neoplasm of lower-inner quadrant of left breast in female, estrogen receptor negative (HCC).  Stage T2,Nx,Mx Left Breast LIQ, Invasive Ductal Carcinoma, ER- / PR- / Her2-, Grade 3  Prior history of ER/PR+, Her2- left breast cancer diagnosed in 2007, s/p left breast lumpectomy w/ SLN excisions performed in September 2007, adjuvant radiation therapy, followed by antiestrogen therapy completed in December of 2012.    ICD-10-CM   1. Malignant neoplasm of lower-inner quadrant of left breast in female, estrogen receptor negative (HCC)  C50.312    Z17.1       HISTORY OF PRESENT ILLNESS::[name] is a 78 y.o. female who is presenting to the office today for evaluation of her newly diagnosed breast cancer. She is accompanied by one of her cousins. She is doing well overall.   She initially presented with a palpable right breast mass in September of 2023 which prompted a bilateral diagnostic mammogram and right breast ultrasound on 12/13/21 which showed an area of likely benign post traumatic changes in the 12 o'clock right breast, correlating with the palpable are of concern.   Although delayed, she recently presented for follow-up imaging to reassess this finding, consisting of bilateral diagnostic mammography with tomography and bilateral breast ultrasonography [facility] on 08[facility]15[facility]23 which demonstrated a highly suspicious mass with associated calcifications in the 8 o'clock left breast collectively measuring 2.8 cm (with the associated calcifications extending slightly outside of the mass spanning approximately 5 mm or  less). The previously demonstrated mass-like area in the 12 o'clock right breast was not demonstrated, and has thus likely resolved. Imaging otherwise shows no mammographic evidence of malignancy in the right breast and no evidence of axillary lymphadenopathy in either axilla.   Biopsy of the 8 o'clock left breast mass on 08/20/23 showed: grade 3 invasive ductal carcinoma measuring 1.4 cm in the greatest linear extent of the sample with extensive necrosis. Prognostic indicators significant for: estrogen receptor, 0% negative and progesterone receptor, 0% negative. Proliferation marker Ki67 at 80%. HER2 negative. No lymph nodes were examined.    The patient was referred today for presentation in the multidisciplinary conference.  Radiology studies and pathology slides were presented there for review and discussion of treatment options.  A consensus was discussed regarding potential next steps.  PREVIOUS RADIATION THERAPY: Yes, she reports receiving radiation therapy under direction of Dr. Lorri Rota.  She reports receiving approximately 6 and half weeks of radiation therapy directed at the left breast.  Further details pending at this time.  History of ER/PR+, Her2- Left breast cancer diagnosed in 2007, s/p left breast lumpectomy w/ SLN exicisions performed in September 2007, adjuvant radiation therapy, followed by antiestrogen therapy completed in December of 2012   (Info per Dr. Mikel Alderman follow-up visit note dated 05/07/2011)   PAST MEDICAL HISTORY:  Past Medical History:  Diagnosis Date   Absence of menstruation    amenorrhea   Angioneurotic edema not elsewhere classified    due to ACE-I   Breast cancer (HCC)    Breast disorder    breast cancer 2007   Cancer [facility]) 2007   Left Breast Cancer   Carotid stenosis    Carotid US  (8/15):  RICA 40-59%; LICA 40-59% >>> FU 1 year   H/O: rheumatic fever    History of breast cancer 07/13/2015   HLD (hyperlipidemia)    Occlusion and stenosis of  carotid artery without mention of cerebral infarction    Other abnormal glucose    fasting hyperglycemia   Personal history of malignant neoplasm of breast    Personal history of radiation therapy 2007   Unspecified essential hypertension     PAST SURGICAL HISTORY: Past Surgical History:  Procedure Laterality Date   APPENDECTOMY     BREAST BIOPSY Left 12/06/2005   malignant   BREAST BIOPSY Left 08/20/2023   US  LT BREAST BX W LOC DEV 1ST LESION IMG BX SPEC US  GUIDE 08/20/2023 [facility] MAMMOGRAPHY   BREAST LUMPECTOMY Left 01/08/2006   BREAST LUMPECTOMY Left 01/08/2006   COLONOSCOPY  2005   negative; Dr Tova Fresh   COLONOSCOPY N/A 09/02/2015   Procedure: COLONOSCOPY;  Surgeon: Alyce Jubilee, MD;  Location: [facility];  Service: Endoscopy;  Laterality: N/A;  1215-moved to 1130 Ginger to notify pt   G0 P0     Dr. Arbie Knock    FAMILY HISTORY:  Family History  Problem Relation Age of Onset   Breast cancer Sister    Hypertension Other        some of Pts brothers and sisters   Heart attack Neg Hx    Stroke Neg Hx    Diabetes Neg Hx    Heart disease Neg Hx     SOCIAL HISTORY:  Social History   Socioeconomic History   Marital status: Single    Spouse name: Not on file   Number of children: Not on file   Years of education: Not on file   Highest education level: Not on file  Occupational History   Not on file  Tobacco Use   Smoking status: Every Day    Current packs/day: 0.50    Average packs/day: 0.5 packs/day for 48.0 years (24.0 ttl pk-yrs)    Types: Cigarettes    Passive exposure: Current   Smokeless tobacco: Never   Tobacco comments:    Smokes daily.    Vaping Use   Vaping status: Never Used  Substance and Sexual Activity   Alcohol use: Yes    Alcohol/week: 1.0 standard drink of alcohol    Types: 1 Standard drinks or equivalent per week    Comment: occ   Drug use: No   Sexual activity: Yes    Birth control/protection: Post-menopausal  Other Topics Concern   Not on  file  Social History Narrative   Single, no children.   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    Social Drivers of Health   Financial Resource Strain: Medium Risk (05/31/2023)   Overall Financial Resource Strain (CARDIA)    Difficulty of Paying Living Expenses: Somewhat hard  Food Insecurity: No Food Insecurity (05/31/2023)   Hunger Vital Sign    Worried About Running Out of Food in the Last Year: Never true    Ran Out of Food in the Last Year: Never true  Transportation Needs: No Transportation Needs (05/31/2023)   PRAPARE - Administrator, Civil Service (Medical): No    Lack of Transportation (Non-Medical): No  Physical Activity: Sufficiently Active (05/31/2023)   Exercise Vital Sign    Days of Exercise per Week: 5 days    Minutes of Exercise per Session: 60 min  Stress: No Stress Concern Present (05/31/2023)   [employer] of  Occupational Health - Occupational Stress Questionnaire    Feeling of Stress : Not at all  Social Connections: Moderately Isolated (05/31/2023)   Social Connection and Isolation Panel [NHANES]    Frequency of Communication with Friends and Family: More than three times a week    Frequency of Social Gatherings with Friends and Family: More than three times a week    Attends Religious Services: 1 to 4 times per year    Active Member of [employer] or Organizations: No    Attends Engineer, structural: Never    Marital Status: Never married    ALLERGIES:  Allergies  Allergen Reactions   Benazepril Hcl     REACTION: FACE SWELLING (ANGIOEDEMA); she can not take ARBS  !!! Because of a history of documented adverse serious drug reaction;Medi Alert bracelet  is recommended   Peanut-Containing Drug Products     03/13/13 hivesw/o facial swelling   Shellfish Allergy     03/15/13 hives & facial swelling   Yellow Dyes (Non-Tartrazine)     12/14 hives  & facial swelling with Arlis Bent Aid    MEDICATIONS:  Current Outpatient Medications   Medication Sig Dispense Refill   amLODipine  (NORVASC ) 10 MG tablet TAKE 1 TABLET BY MOUTH EVERY DAY 90 tablet 2   aspirin  EC 81 MG tablet Take 1 tablet (81 mg total) by mouth daily. 90 tablet 3   atorvastatin  (LIPITOR) 40 MG tablet Take 1 tablet (40 mg total) by mouth daily. 90 tablet 1   gabapentin  (NEURONTIN ) 300 MG capsule TAKE 1 CAPSULE BY MOUTH EVERYDAY AT BEDTIME 90 capsule 1   hydrochlorothiazide  (HYDRODIURIL ) 25 MG tablet TAKE 1 TABLET (25 MG TOTAL) BY MOUTH DAILY. 15 tablet 0   labetalol  (NORMODYNE ) 300 MG tablet TAKE 1 TABLET BY MOUTH 2 TIMES DAILY. 180 tablet 2   omeprazole  (PRILOSEC) 40 MG capsule TAKE 1 CAPSULE BY MOUTH EVERY DAY 90 capsule 0   Vitamin D , Ergocalciferol , (DRISDOL ) 1.25 MG (50000 UNIT) CAPS capsule Take 1 capsule (50,000 Units total) by mouth every 7 (seven) days. 8 capsule 0   No current facility-administered medications for this encounter.    REVIEW OF SYSTEMS: She denies any nipple discharge or bleeding.  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   PHYSICAL EXAM:  Lungs are clear to auscultation bilaterally. Heart has regular rate and rhythm. No palpable cervical, supraclavicular, or axillary adenopathy. Abdomen soft, non-tender, normal bowel sounds. Breast: Right breast with no palpable mass, nipple discharge, or bleeding.  Large and pendulous.  Left breast is somewhat smaller than the right in light of her previous surgery and radiation therapy.  Chronic hyperpigmentation changes noted in the left breast and diffuse induration throughout the breast.  Lumpectomy scars noted in the lateral aspect of the breast.  Within the medial aspect of the breast she has a large approximately 3 cm dominant mass.  Some mild bruising associated with her biopsy.  KPS = 90  100 - Normal; no complaints; no evidence of disease. 90   - Able to carry on normal activity; minor signs or symptoms of disease. 80   - Normal  activity with effort; some signs or symptoms of disease. 66   - Cares for self; unable to carry on normal activity or to do active work. 60   - Requires occasional assistance, but is able to care for most of his personal needs. 50   - Requires considerable assistance  and frequent medical care. 40   - Disabled; requires special care and assistance. 30   - Severely disabled; hospital admission is indicated although death not imminent. 20   - Very sick; hospital admission necessary; active supportive treatment necessary. 10   - Moribund; fatal processes progressing rapidly. 0     - Dead  Karnofsky DA, Abelmann WH, Craver LS and Burchenal [facility] [phone]) The use of the nitrogen mustards in the palliative treatment of carcinoma: with particular reference to bronchogenic carcinoma Cancer 1 [id number]  LABORATORY DATA:  Lab Results  Component Value Date   WBC 9.4 05/04/2022   HGB 12.1 05/04/2022   HCT 36.5 05/04/2022   MCV 85.7 05/04/2022   PLT 279.0 05/04/2022   Lab Results  Component Value Date   NA 140 05/04/2022   K 4.1 05/04/2022   CL 103 05/04/2022   CO2 26 05/04/2022   Lab Results  Component Value Date   ALT 9 05/04/2022   AST 15 05/04/2022   ALKPHOS 69 05/04/2022   BILITOT 0.6 05/04/2022    PULMONARY FUNCTION TEST:   Review Flowsheet        No data to display          RADIOGRAPHY: US  LT BREAST BX W LOC DEV 1ST LESION IMG BX SPEC US  GUIDE Addendum Date: 08/21/2023 ADDENDUM REPORT: 08/21/2023 12:26 ADDENDUM: Pathology revealed: GRADE III INVASIVE DUCTAL CARCINOMA, WITH EXTENSIVE NECROSIS of the LEFT breast, 8 o'clock, 8cmfn, (ribbon clip). This was found to be concordant by Dr. Marvene Slipper. Pathology results were discussed with the patient by telephone. The patient reported doing well after the biopsy with minimal tenderness at the site. Post biopsy instructions and care were reviewed and questions were answered. The patient was encouraged to call [facility] for any additional concerns. My direct phone number was provided. The patient was referred to [facility] on Aug 28, 2023. Pathology results reported by Kraig Peru, RN on 08/21/2023. Electronically Signed   By: Rinda Cheers M.D.   On: 08/21/2023 12:26   Result Date: 08/21/2023 CLINICAL DATA:  78 year old with a remote personal history of malignant LEFT breast lumpectomy in 2007, presenting with a screening detected 2.8 cm mass with associated suspicious calcifications in the LOWER INNER QUADRANT of the LEFT breast at 8 o'clock 8 cm from nipple. EXAM: ULTRASOUND GUIDED LEFT BREAST CORE NEEDLE BIOPSY COMPARISON:  Previous exam(s). PROCEDURE: I met with the patient and we discussed the procedure of ultrasound-guided biopsy, including benefits and alternatives. We discussed the high likelihood of a successful procedure. We discussed the risks of the procedure, including infection, bleeding, tissue injury, clip migration, and inadequate sampling. Informed written consent was given. The usual time-out protocol was performed immediately prior to the procedure. Lesion quadrant: LOWER INNER QUADRANT. Using sterile technique with chlorhexidine as skin antisepsis, 1% Lidocaine as local anesthetic, under direct ultrasound visualization, a 12 gauge Bard Marquee core needle device placed through an 11 gauge introducer needle was used to perform biopsy of the mass with associated calcifications in the LOWER INNER QUADRANT using a medial approach. At the conclusion of the procedure, a ribbon shaped tissue marker clip was deployed into the biopsy cavity. Patient tolerated procedure well that apparent immediate complications. Follow up 2 view mammogram was performed in order to confirm clip placement and was dictated separately. IMPRESSION: Ultrasound guided core needle biopsy of a highly suspicious 2.8 cm mass with associated  calcifications in the LOWER INNER QUADRANT of the LEFT breast. Electronically Signed: By: Rinda Cheers M.D. On: 08/20/2023 14:37   MM CLIP PLACEMENT LEFT Result Date: 08/20/2023 CLINICAL DATA:  Confirmation of clip placement after ultrasound-guided core needle biopsy of a highly suspicious mass and associated calcifications in the LOWER INNER QUADRANT of the LEFT breast. EXAM: 2D and 3D DIAGNOSTIC LEFT MAMMOGRAM POST ULTRASOUND BIOPSY COMPARISON:  Previous exam(s). ACR Breast Density Category b: There are scattered areas of fibroglandular density. FINDINGS: 2D and 3D full field CC and mediolateral mammographic images were obtained following ultrasound guided biopsy of a mass with associated calcifications in the LOWER INNER QUADRANT of the LEFT breast. The coil shaped tissue marking clip is appropriately positioned within the biopsied mass in the LOWER INNER QUADRANT. Expected post biopsy changes are present without evidence of hematoma. IMPRESSION: Appropriate positioning of the ribbon shaped tissue marking clip within the biopsied mass in the LOWER INNER QUADRANT of the LEFT breast. Final Assessment: Post Procedure Mammograms for Marker Placement Electronically Signed   By: Rinda Cheers M.D.   On: 08/20/2023 14:36   MM 3D DIAGNOSTIC MAMMOGRAM BILATERAL BREAST Result Date: 08/15/2023 CLINICAL DATA:  78 year old female presenting for delayed follow-up of a probably benign right breast mass. Patient has a history of left breast cancer status post lumpectomy in 2007. EXAM: DIGITAL DIAGNOSTIC BILATERAL MAMMOGRAM WITH TOMOSYNTHESIS AND CAD; ULTRASOUND LEFT BREAST LIMITED; ULTRASOUND RIGHT BREAST LIMITED TECHNIQUE: Bilateral digital diagnostic mammography and breast tomosynthesis was performed. The images were evaluated with computer-aided detection. ; Targeted ultrasound examination of the left breast was performed.; Targeted ultrasound examination of the right breast was performed COMPARISON:  Previous  exam(s). ACR Breast Density Category b: There are scattered areas of fibroglandular density. FINDINGS: Mammogram: Right breast: The previously seen small mass in the upper superficial right breast is no longer visualized. No suspicious mass, distortion, or microcalcifications are identified to suggest presence of malignancy. Left breast: Spot 2D magnification views of the left breast were performed in addition to standard views. There is a new irregular suspicious mass in the lower inner left breast measuring 3.1 cm. There are associated suspicious pleomorphic calcifications. The calcifications extend slightly outside of the mass at the superior, anterior and inferior aspect by 0.5 cm or less. There are benign postsurgical changes in the upper central left breast. On physical exam of the lower inner left breast I feel a discrete mass. Ultrasound: Right breast: Targeted ultrasound performed in the right breast at 12 o'clock 6 cm from the nipple demonstrating resolution of the mass previously seen in this location. Ultrasound: Targeted ultrasound performed in the left breast at 8 o'clock 8 cm from the nipple demonstrating an irregular hypoechoic mass with associated calcifications overall measuring 2.3 x 2.0 x 2.8 cm. Targeted ultrasound of the left axilla demonstrates normal lymph nodes. IMPRESSION: 1. Highly suspicious mass with associated calcifications in the left breast at 8 o'clock measuring 2.8 cm. The calcifications extend slightly outside of the mass by 0.5 cm or less. 2. Resolution of the previously seen mass in the right breast at 12 o'clock. No mammographic evidence of malignancy in the right breast. RECOMMENDATION: Ultrasound-guided core needle biopsy x1 of the left breast. I have discussed the findings and recommendations with the patient. If applicable, a reminder letter will be sent to the patient regarding the next appointment. BI-RADS CATEGORY  5: Highly suggestive of malignancy. Electronically  Signed   By: Allena Ito M.D.   On: 08/15/2023 15:17   US   LIMITED ULTRASOUND INCLUDING AXILLA LEFT BREAST  Result Date: 08/15/2023 CLINICAL DATA:  78 year old female presenting for delayed follow-up of a probably benign right breast mass. Patient has a history of left breast cancer status post lumpectomy in 2007. EXAM: DIGITAL DIAGNOSTIC BILATERAL MAMMOGRAM WITH TOMOSYNTHESIS AND CAD; ULTRASOUND LEFT BREAST LIMITED; ULTRASOUND RIGHT BREAST LIMITED TECHNIQUE: Bilateral digital diagnostic mammography and breast tomosynthesis was performed. The images were evaluated with computer-aided detection. ; Targeted ultrasound examination of the left breast was performed.; Targeted ultrasound examination of the right breast was performed COMPARISON:  Previous exam(s). ACR Breast Density Category b: There are scattered areas of fibroglandular density. FINDINGS: Mammogram: Right breast: The previously seen small mass in the upper superficial right breast is no longer visualized. No suspicious mass, distortion, or microcalcifications are identified to suggest presence of malignancy. Left breast: Spot 2D magnification views of the left breast were performed in addition to standard views. There is a new irregular suspicious mass in the lower inner left breast measuring 3.1 cm. There are associated suspicious pleomorphic calcifications. The calcifications extend slightly outside of the mass at the superior, anterior and inferior aspect by 0.5 cm or less. There are benign postsurgical changes in the upper central left breast. On physical exam of the lower inner left breast I feel a discrete mass. Ultrasound: Right breast: Targeted ultrasound performed in the right breast at 12 o'clock 6 cm from the nipple demonstrating resolution of the mass previously seen in this location. Ultrasound: Targeted ultrasound performed in the left breast at 8 o'clock 8 cm from the nipple demonstrating an irregular hypoechoic mass with associated  calcifications overall measuring 2.3 x 2.0 x 2.8 cm. Targeted ultrasound of the left axilla demonstrates normal lymph nodes. IMPRESSION: 1. Highly suspicious mass with associated calcifications in the left breast at 8 o'clock measuring 2.8 cm. The calcifications extend slightly outside of the mass by 0.5 cm or less. 2. Resolution of the previously seen mass in the right breast at 12 o'clock. No mammographic evidence of malignancy in the right breast. RECOMMENDATION: Ultrasound-guided core needle biopsy x1 of the left breast. I have discussed the findings and recommendations with the patient. If applicable, a reminder letter will be sent to the patient regarding the next appointment. BI-RADS CATEGORY  5: Highly suggestive of malignancy. Electronically Signed   By: Allena Ito M.D.   On: 08/15/2023 15:17   US  LIMITED ULTRASOUND INCLUDING AXILLA RIGHT BREAST Result Date: 08/15/2023 CLINICAL DATA:  78 year old female presenting for delayed follow-up of a probably benign right breast mass. Patient has a history of left breast cancer status post lumpectomy in 2007. EXAM: DIGITAL DIAGNOSTIC BILATERAL MAMMOGRAM WITH TOMOSYNTHESIS AND CAD; ULTRASOUND LEFT BREAST LIMITED; ULTRASOUND RIGHT BREAST LIMITED TECHNIQUE: Bilateral digital diagnostic mammography and breast tomosynthesis was performed. The images were evaluated with computer-aided detection. ; Targeted ultrasound examination of the left breast was performed.; Targeted ultrasound examination of the right breast was performed COMPARISON:  Previous exam(s). ACR Breast Density Category b: There are scattered areas of fibroglandular density. FINDINGS: Mammogram: Right breast: The previously seen small mass in the upper superficial right breast is no longer visualized. No suspicious mass, distortion, or microcalcifications are identified to suggest presence of malignancy. Left breast: Spot 2D magnification views of the left breast were performed in addition to  standard views. There is a new irregular suspicious mass in the lower inner left breast measuring 3.1 cm. There are associated suspicious pleomorphic calcifications. The calcifications extend slightly outside of the mass at the superior, anterior  and inferior aspect by 0.5 cm or less. There are benign postsurgical changes in the upper central left breast. On physical exam of the lower inner left breast I feel a discrete mass. Ultrasound: Right breast: Targeted ultrasound performed in the right breast at 12 o'clock 6 cm from the nipple demonstrating resolution of the mass previously seen in this location. Ultrasound: Targeted ultrasound performed in the left breast at 8 o'clock 8 cm from the nipple demonstrating an irregular hypoechoic mass with associated calcifications overall measuring 2.3 x 2.0 x 2.8 cm. Targeted ultrasound of the left axilla demonstrates normal lymph nodes. IMPRESSION: 1. Highly suspicious mass with associated calcifications in the left breast at 8 o'clock measuring 2.8 cm. The calcifications extend slightly outside of the mass by 0.5 cm or less. 2. Resolution of the previously seen mass in the right breast at 12 o'clock. No mammographic evidence of malignancy in the right breast. RECOMMENDATION: Ultrasound-guided core needle biopsy x1 of the left breast. I have discussed the findings and recommendations with the patient. If applicable, a reminder letter will be sent to the patient regarding the next appointment. BI-RADS CATEGORY  5: Highly suggestive of malignancy. Electronically Signed   By: Allena Ito M.D.   On: 08/15/2023 15:17      IMPRESSION: Stage T2,Nx,Mx Left Breast LIQ, Invasive Ductal Carcinoma, ER- / PR- / Her2-, Grade 3  She was found to have a an aggressive triple negative breast cancer in a previously irradiated breast.  Standard of care recommendations would be for her to proceed with mastectomy in light of her previous radiation treatments.  We discussed that if  she has a mastectomy with clear margins and no evidence of lymph node involvement, we would likely not recommend postmastectomy radiation therapy in light of her previous radiation along the chest/breast area.  If however she is found to have node positive disease given her aggressive tumor we may consider adjuvant radiation therapy to the axillary region.  This recommendation would be assuming that she did not have previous radiation therapy to her left axillary area.  This could be confirmed in review of her radiation records in the past.  She is considering mastectomy with immediate reconstruction and will likely met with plastic surgery for further discussion of this issue.   PLAN:  She will proceed with neoadjuvant chemotherapy and port placement.  Surgery to follow with surgery approach to be determined at a later date.  Possible radiation therapy depending on pathologic findings and lymph node evaluation.   ------------------------------------------------  Noralee Beam, PhD, MD  This document serves as a record of services personally performed by Retta Caster, MD. It was created on his behalf by Aleta Anda, a trained medical scribe. The creation of this record is based on the scribe's personal observations and the provider's statements to them. This document has been checked and approved by the attending provider.

## 2023-08-28 ENCOUNTER — Encounter: Payer: Self-pay | Admitting: Genetic Counselor

## 2023-08-28 ENCOUNTER — Ambulatory Visit: Payer: Self-pay | Admitting: Surgery

## 2023-08-28 ENCOUNTER — Other Ambulatory Visit: Payer: Self-pay

## 2023-08-28 ENCOUNTER — Inpatient Hospital Stay: Admitting: Licensed Clinical Social Worker

## 2023-08-28 ENCOUNTER — Inpatient Hospital Stay: Attending: Hematology and Oncology | Admitting: Hematology and Oncology

## 2023-08-28 ENCOUNTER — Encounter: Payer: Self-pay | Admitting: *Deleted

## 2023-08-28 ENCOUNTER — Encounter: Payer: Self-pay | Admitting: Physical Therapy

## 2023-08-28 ENCOUNTER — Ambulatory Visit
Admission: RE | Admit: 2023-08-28 | Discharge: 2023-08-28 | Disposition: A | Discharge status: Home / Self Care | Source: Ambulatory Visit | Attending: Radiation Oncology | Admitting: Radiation Oncology

## 2023-08-28 ENCOUNTER — Ambulatory Visit: Attending: Surgery | Admitting: Physical Therapy

## 2023-08-28 ENCOUNTER — Inpatient Hospital Stay (HOSPITAL_BASED_OUTPATIENT_CLINIC_OR_DEPARTMENT_OTHER): Admitting: Genetic Counselor

## 2023-08-28 ENCOUNTER — Inpatient Hospital Stay

## 2023-08-28 VITALS — BP 138/49 | HR 51 | Temp 98.5°F | Resp 17 | Wt 161.9 lb

## 2023-08-28 DIAGNOSIS — Z923 Personal history of irradiation: Secondary | ICD-10-CM | POA: Diagnosis not present

## 2023-08-28 DIAGNOSIS — Z79899 Other long term (current) drug therapy: Secondary | ICD-10-CM | POA: Diagnosis not present

## 2023-08-28 DIAGNOSIS — C50812 Malignant neoplasm of overlapping sites of left female breast: Secondary | ICD-10-CM | POA: Diagnosis not present

## 2023-08-28 DIAGNOSIS — C50312 Malignant neoplasm of lower-inner quadrant of left female breast: Secondary | ICD-10-CM

## 2023-08-28 DIAGNOSIS — R293 Abnormal posture: Secondary | ICD-10-CM | POA: Diagnosis not present

## 2023-08-28 DIAGNOSIS — Z171 Estrogen receptor negative status [ER-]: Secondary | ICD-10-CM | POA: Insufficient documentation

## 2023-08-28 DIAGNOSIS — F17210 Nicotine dependence, cigarettes, uncomplicated: Secondary | ICD-10-CM | POA: Insufficient documentation

## 2023-08-28 DIAGNOSIS — Z17421 Hormone receptor negative with human epidermal growth factor receptor 2 negative status: Secondary | ICD-10-CM | POA: Insufficient documentation

## 2023-08-28 DIAGNOSIS — Z853 Personal history of malignant neoplasm of breast: Secondary | ICD-10-CM | POA: Diagnosis not present

## 2023-08-28 DIAGNOSIS — Z803 Family history of malignant neoplasm of breast: Secondary | ICD-10-CM | POA: Diagnosis not present

## 2023-08-28 DIAGNOSIS — I10 Essential (primary) hypertension: Secondary | ICD-10-CM | POA: Diagnosis not present

## 2023-08-28 LAB — CMP (CANCER CENTER ONLY)
ALT: 7 U/L (ref 0–44)
AST: 12 U/L — ABNORMAL LOW (ref 15–41)
Albumin: 4.5 g/dL (ref 3.5–5.0)
Alkaline Phosphatase: 66 U/L (ref 38–126)
Anion gap: 11 (ref 5–15)
BUN: 42 mg/dL — ABNORMAL HIGH (ref 8–23)
CO2: 25 mmol/L (ref 22–32)
Calcium: 9.9 mg/dL (ref 8.9–10.3)
Chloride: 103 mmol/L (ref 98–111)
Creatinine: 1.49 mg/dL — ABNORMAL HIGH (ref 0.44–1.00)
GFR, Estimated: 36 mL/min — ABNORMAL LOW (ref >60)
Glucose, Bld: 107 mg/dL — ABNORMAL HIGH (ref 70–99)
Potassium: 3.8 mmol/L (ref 3.5–5.1)
Sodium: 139 mmol/L (ref 135–145)
Total Bilirubin: 0.7 mg/dL (ref 0.0–1.2)
Total Protein: 8.2 g/dL — ABNORMAL HIGH (ref 6.5–8.1)

## 2023-08-28 LAB — CBC WITH DIFFERENTIAL (CANCER CENTER ONLY)
Abs Immature Granulocytes: 0.01 10*3/uL (ref 0.00–0.07)
Basophils Absolute: 0 10*3/uL (ref 0.0–0.1)
Basophils Relative: 0 %
Eosinophils Absolute: 0.1 10*3/uL (ref 0.0–0.5)
Eosinophils Relative: 2 %
HCT: 41.7 % (ref 36.0–46.0)
Hemoglobin: 13.6 g/dL (ref 12.0–15.0)
Immature Granulocytes: 0 %
Lymphocytes Relative: 37 %
Lymphs Abs: 2.7 10*3/uL (ref 0.7–4.0)
MCH: 27.9 pg (ref 26.0–34.0)
MCHC: 32.6 g/dL (ref 30.0–36.0)
MCV: 85.5 fL (ref 80.0–100.0)
Monocytes Absolute: 0.3 10*3/uL (ref 0.1–1.0)
Monocytes Relative: 4 %
Neutro Abs: 4.2 10*3/uL (ref 1.7–7.7)
Neutrophils Relative %: 57 %
Platelet Count: 242 10*3/uL (ref 150–400)
RBC: 4.88 MIL/uL (ref 3.87–5.11)
RDW: 13.9 % (ref 11.5–15.5)
WBC Count: 7.5 10*3/uL (ref 4.0–10.5)
nRBC: 0 % (ref 0.0–0.2)

## 2023-08-28 LAB — GENETIC SCREENING ORDER

## 2023-08-28 NOTE — Progress Notes (Signed)
 [facility] CONSULT NOTE  Patient Care Team: [name], MD as PCP - General (Internal Medicine) Arnoldo Lapping, MD as PCP - Cardiology (Cardiology) Jonathan Neighbor, [facility] (Inactive) as Pharmacist (Pharmacist) Jonathan Neighbor, [facility] (Inactive) as Pharmacist (Pharmacist) Worthy Heads, MD as Consulting Physician (Ophthalmology) Ivery Marking, MD as Consulting Physician (Obstetrics and Gynecology) Murleen Arms, MD as Consulting Physician (Hematology and Oncology) Auther Bo, RN as Oncology Nurse Navigator Alane Hsu, RN as Oncology Nurse Navigator Sim Dryer, MD as Consulting Physician (General Surgery) Retta Caster, MD as Consulting Physician (Radiation Oncology)  CHIEF COMPLAINTS/PURPOSE OF CONSULTATION:  Newly diagnosed breast cancer  HISTORY OF PRESENTING ILLNESS:  Tina [age] y.o. female is here because of recent diagnosis of left breast cancer  I reviewed her records extensively and collaborated the history with the patient.  SUMMARY OF ONCOLOGIC HISTORY: Oncology History  Malignant neoplasm of lower-inner quadrant of left breast in female, estrogen receptor negative (HCC)  08/15/2023 Mammogram   Highly suspicious mass with associated calcifications in the left breast at 8 o'clock measuring 2.8 cm. The calcifications extend slightly outside of the mass by 0.5 cm or less. Resolution of the previously seen mass in the right breast at 12o'clock. No mammographic evidence of malignancy in the right breast.     08/20/2023 Pathology Results   Left breast needle core biopsy showed grade 3 IDC, triple negative.   08/26/2023 Initial Diagnosis   Malignant neoplasm of lower-inner quadrant of left breast in female, estrogen receptor negative (HCC)   08/28/2023 Cancer Staging   Staging form: Breast, AJCC 8th Edition - Clinical stage from 08/28/2023: Stage IIB (cT2, cN0, cM0, G3, ER-, PR-, HER2-) - Signed by Murleen Arms, MD on  08/28/2023 Stage prefix: Initial diagnosis Histologic grading system: 3 grade system Laterality: Left Staged by: Pathologist and managing physician Stage used in treatment planning: Yes National guidelines used in treatment planning: Yes Type of national guideline used in treatment planning: NCCN    Discussed the use of AI scribe software for clinical note transcription with the patient, who gave verbal consent to proceed.  History of Present Illness Raesha Coonrod is a 78 year old female with a history of breast cancer who presents for evaluation of a new breast mass.  Approximately two weeks ago, she discovered a lump in her left breast at the eight o'clock position, measuring 2.8 centimeters, following a mammogram. A biopsy confirmed it as triple negative breast cancer.  She has a history of breast cancer in 2005/09/29 of left breast, previously treated with surgery, adj radiation and tamoxifen for five years. She is currently on medication for high blood pressure. She had rheumatic fever as a child with no lasting effects. No diabetes or heart disease.  Her family history is significant for a sister who also had breast cancer. She has no children and does not take any hormone therapy.  Socially, she has a flexible work schedule and can work from home. She enjoys cooking and generally avoids eating out. She has no current partner, as her previous partner passed away in September 29, 2017 from cancer.  Rest of the pertinent 10 point ROS reviewed and neg.   MEDICAL HISTORY:  Past Medical History:  Diagnosis Date   Absence of menstruation    amenorrhea   Angioneurotic edema not elsewhere classified    due to ACE-I   Breast cancer (HCC)    Breast disorder    breast cancer 09-29-2005   Cancer [facility]) 09/29/05  Left Breast Cancer   Carotid stenosis    Carotid US  (8/15):  RICA 40-59%; LICA 40-59% >>> FU 1 year   H/O: rheumatic fever    History of breast cancer 07/13/2015   HLD (hyperlipidemia)    Occlusion and  stenosis of carotid artery without mention of cerebral infarction    Other abnormal glucose    fasting hyperglycemia   Personal history of malignant neoplasm of breast    Personal history of radiation therapy 2007   Unspecified essential hypertension     SURGICAL HISTORY: Past Surgical History:  Procedure Laterality Date   APPENDECTOMY     BREAST BIOPSY Left 12/06/2005   malignant   BREAST BIOPSY Left 08/20/2023   US  LT BREAST BX W LOC DEV 1ST LESION IMG BX SPEC US  GUIDE 08/20/2023 [facility] MAMMOGRAPHY   BREAST LUMPECTOMY Left 01/08/2006   BREAST LUMPECTOMY Left 01/08/2006   COLONOSCOPY  2005   negative; Dr Tova Fresh   COLONOSCOPY N/A 09/02/2015   Procedure: COLONOSCOPY;  Surgeon: Alyce Jubilee, MD;  Location: [facility] SUITE;  Service: Endoscopy;  Laterality: N/A;  1215-moved to 1130 Ginger to notify pt   G0 P0     Dr. Arbie Knock    SOCIAL HISTORY: Social History   Socioeconomic History   Marital status: Single    Spouse name: Not on file   Number of children: Not on file   Years of education: Not on file   Highest education level: Not on file  Occupational History   Not on file  Tobacco Use   Smoking status: Every Day    Current packs/day: 0.50    Average packs/day: 0.5 packs/day for 48.0 years (24.0 ttl pk-yrs)    Types: Cigarettes    Passive exposure: Current   Smokeless tobacco: Never   Tobacco comments:    Smokes daily.    Vaping Use   Vaping status: Never Used  Substance and Sexual Activity   Alcohol use: Yes    Alcohol/week: 1.0 standard drink of alcohol    Types: 1 Standard drinks or equivalent per week    Comment: occ   Drug use: No   Sexual activity: Yes    Birth control/protection: Post-menopausal  Other Topics Concern   Not on file  Social History Narrative   Single, no children.   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    Social Drivers of Health   Financial Resource Strain: Medium Risk (05/31/2023)   Overall Financial Resource Strain (CARDIA)     Difficulty of Paying Living Expenses: Somewhat hard  Food Insecurity: No Food Insecurity (05/31/2023)   Hunger Vital Sign    Worried About Running Out of Food in the Last Year: Never true    Ran Out of Food in the Last Year: Never true  Transportation Needs: No Transportation Needs (05/31/2023)   PRAPARE - Administrator, Civil Service (Medical): No    Lack of Transportation (Non-Medical): No  Physical Activity: Sufficiently Active (05/31/2023)   Exercise Vital Sign    Days of Exercise per Week: 5 days    Minutes of Exercise per Session: 60 min  Stress: No Stress Concern Present (05/31/2023)   [employer] of Occupational Health - Occupational Stress Questionnaire    Feeling of Stress : Not at all  Social Connections: Moderately Isolated (05/31/2023)   Social Connection and Isolation Panel [NHANES]    Frequency of Communication with Friends and Family: More than three times a week    Frequency of  Social Gatherings with Friends and Family: More than three times a week    Attends Religious Services: 1 to 4 times per year    Active Member of [employer] or Organizations: No    Attends Banker Meetings: Never    Marital Status: Never married  Intimate Partner Violence: Not At Risk (05/31/2023)   Humiliation, Afraid, Rape, and Kick questionnaire    Fear of Current or Ex-Partner: No    Emotionally Abused: No    Physically Abused: No    Sexually Abused: No    FAMILY HISTORY: Family History  Problem Relation Age of Onset   Breast cancer Sister    Hypertension Other        some of Pts brothers and sisters   Heart attack Neg Hx    Stroke Neg Hx    Diabetes Neg Hx    Heart disease Neg Hx     ALLERGIES:  is allergic to benazepril hcl, peanut-containing drug products, shellfish allergy, and yellow dyes (non-tartrazine).  MEDICATIONS:  Current Outpatient Medications  Medication Sig Dispense Refill   amLODipine  (NORVASC ) 10 MG tablet TAKE 1 TABLET BY MOUTH EVERY DAY  90 tablet 2   aspirin  EC 81 MG tablet Take 1 tablet (81 mg total) by mouth daily. 90 tablet 3   atorvastatin  (LIPITOR) 40 MG tablet Take 1 tablet (40 mg total) by mouth daily. 90 tablet 1   gabapentin  (NEURONTIN ) 300 MG capsule TAKE 1 CAPSULE BY MOUTH EVERYDAY AT BEDTIME 90 capsule 1   hydrochlorothiazide  (HYDRODIURIL ) 25 MG tablet TAKE 1 TABLET (25 MG TOTAL) BY MOUTH DAILY. 15 tablet 0   labetalol  (NORMODYNE ) 300 MG tablet TAKE 1 TABLET BY MOUTH 2 TIMES DAILY. 180 tablet 2   omeprazole  (PRILOSEC) 40 MG capsule TAKE 1 CAPSULE BY MOUTH EVERY DAY 90 capsule 0   Vitamin D , Ergocalciferol , (DRISDOL ) 1.25 MG (50000 UNIT) CAPS capsule Take 1 capsule (50,000 Units total) by mouth every 7 (seven) days. 8 capsule 0   No current facility-administered medications for this visit.    REVIEW OF SYSTEMS:   Constitutional: Denies fevers, chills or abnormal night sweats Eyes: Denies blurriness of vision, double vision or watery eyes Ears, nose, mouth, throat, and face: Denies mucositis or sore throat Respiratory: Denies cough, dyspnea or wheezes Cardiovascular: Denies palpitation, chest discomfort or lower extremity swelling Gastrointestinal:  Denies nausea, heartburn or change in bowel habits Skin: Denies abnormal skin rashes Lymphatics: Denies new lymphadenopathy or easy bruising Neurological:Denies numbness, tingling or new weaknesses Behavioral/Psych: Mood is stable, no new changes  Breast: Denies any palpable lumps or discharge All other systems were reviewed with the patient and are negative.  PHYSICAL EXAMINATION: ECOG PERFORMANCE STATUS: 0 - Asymptomatic  Vitals:   08/28/23 0828 08/28/23 0829  BP: (!) 158/62 (!) 138/49  Pulse: (!) 51   Resp: 17   Temp: 98.5 F (36.9 C)   SpO2: 97%    Filed Weights   08/28/23 0828  Weight: 161 lb 14.4 oz (73.4 kg)    GENERAL:alert, no distress and comfortable Large palpable left breast mass in the lower inner quadrant measuring about 3 cm.  No  palpable regional adenopathy. No lower extremity edema.  LABORATORY DATA:  I have reviewed the data as listed Lab Results  Component Value Date   WBC 9.4 05/04/2022   HGB 12.1 05/04/2022   HCT 36.5 05/04/2022   MCV 85.7 05/04/2022   PLT 279.0 05/04/2022   Lab Results  Component Value Date   NA 140  05/04/2022   K 4.1 05/04/2022   CL 103 05/04/2022   CO2 26 05/04/2022    RADIOGRAPHIC STUDIES: I have personally reviewed the radiological reports and agreed with the findings in the report.  ASSESSMENT AND PLAN:  Malignant neoplasm of lower-inner quadrant of left breast in female, estrogen receptor negative (HCC) Assessment and Plan Assessment & Plan Triple negative breast cancer  Newly diagnosed 2.8 cm triple negative breast cancer in left breast, 8 o'clock position. No axillary involvement.  Neoadjuvant chemotherapy proposed to downsize the tumor and also for prognostic information. Although she is a healthy 78 year old, I do not believe she will tolerate the standard of care keynote 522 regimen well.  She also is very focused on keeping her quality of life going, she wants to continue to work.  Reviewed adverse effects of chemotherapy including but not limited to fatigue, nausea, vomiting, diarrhea, cytopenias, neuropathy etc. Shorter 12-week regimen with TC chosen for reduced side effects.  I have explained to her that TC neoadjuvant will be inferior to the standard of care keynote 522 regimen.  Mastectomy planned post-chemotherapy due to prior radiation. Cold cap discussed for hair loss mitigation. Treatment efficacy and quality of life considered. - Initiate 12-week chemotherapy regimen with docetaxel/cyclophosphamide. - Consider cold cap use to reduce hair loss. - Plan mastectomy post-chemotherapy due to prior radiation therapy. - Arrange port placement for chemotherapy administration. - Schedule chemotherapy to start the week of June 9th, pending port  placement.  Hypertension Hypertension managed with four medications.  Follow-up Coordinate treatment schedule with personal commitments. - Schedule chemotherapy to start the week of June 9th, pending port placement. - Coordinate with Dr. Afton Horse for port placement.    All questions were answered. The patient knows to call the clinic with any problems, questions or concerns.    Murleen Arms, MD 08/28/23

## 2023-08-28 NOTE — Progress Notes (Signed)
 [facility] Clinical Social Work  Initial Assessment   [name] is a 78 y.o. year old female accompanied by cousin, [name]. Clinical Social Work was referred by [facility] for assessment of psychosocial needs.   SDOH (Social Determinants of Health) assessments performed: Yes SDOH Interventions    Flowsheet Row Clinical Support from 08/28/2023 in [facility] Of [place] Clinical Support from 05/31/2023 in [facility] Clinical Support from 06/22/2020 in [facility] [place]  SDOH Interventions     Food Insecurity Interventions Intervention Not Indicated Intervention Not Indicated --  Housing Interventions Community Resources Provided, Other (Comment)  [cancer foundations] Intervention Not Indicated Intervention Not Indicated  Transportation Interventions Intervention Not Indicated Intervention Not Indicated --  Utilities Interventions Community Resources Provided, Other (Comment)  [cancer foundations] Intervention Not Indicated --  Alcohol Usage Interventions -- Intervention Not Indicated (Score <7) --  Financial Strain Interventions -- Intervention Not Indicated --  Physical Activity Interventions -- Intervention Not Indicated --  Stress Interventions -- Intervention Not Indicated --  Social Connections Interventions -- Intervention Not Indicated --  Health Literacy Interventions -- Intervention Not Indicated --       SDOH Screenings   Food Insecurity: No Food Insecurity (08/28/2023)  Housing: High Risk (08/28/2023)  Transportation Needs: No Transportation Needs (08/28/2023)  Utilities: At Risk (08/28/2023)  Alcohol Screen: Low Risk  (05/31/2023)  Depression (PHQ2-9): Low Risk  (05/31/2023)  Financial Resource Strain: Medium Risk (05/31/2023)  Physical Activity: Sufficiently Active (05/31/2023)  Social Connections: Moderately Isolated (05/31/2023)  Stress: No Stress Concern Present (05/31/2023)   Tobacco Use: High Risk (08/28/2023)  Health Literacy: Adequate Health Literacy (05/31/2023)     Distress Screen completed: No     No data to display            Family/Social Information:  Housing Arrangement: patient lives alone Family members/support persons in your life? Family including cousin [name] who is a Paramedic and Friends Transportation concerns: no  Employment: Working as "part Chiropractor but works full time hours.  Income source: Employment and Actor concerns: Yes, current concerns Type of concern: Utilities and Rent/ mortgage Food access concerns: no Advanced directives: No Services Currently in place:  [employer]; has accessed local [place] resources before (a [employer])  Coping/ Adjustment to diagnosis: Patient understands treatment plan and what happens next? yes Concerns about diagnosis and/or treatment: Losing my job and/or losing income Patient reported stressors: Finances Patient enjoys time with family/ friends Current coping skills/ strengths: Ability for insight , Manufacturing systems engineer , Motivation for treatment/growth , and Supportive family/friends     SUMMARY: Current SDOH Barriers:  Financial constraints related to limited income  Clinical Social Work Clinical Goal(s):  Explore community resource options for unmet needs related to:  Financial Strain   Interventions: Discussed common feeling and emotions when being diagnosed with cancer, and the importance of support during treatment Informed patient of the support team roles and support services at [facility] Provided CSW contact information and encouraged patient to call with any questions or concerns Provided patient with information about cancer foundations. Will work on applications as pt starts treatment. CSW will send pt a message with what documents she needs to bring in   Follow Up Plan: CSW will see patient on 1st chemo tx day to work on  financial applications Patient verbalizes understanding of plan: Yes    Dierks Wach E Yarielys Beed, LCSW  Clinical Social Worker [facility]

## 2023-08-28 NOTE — Progress Notes (Signed)
 REFERRING PROVIDER: Murleen Arms, MD  PRIMARY PROVIDER:  [name], MD  PRIMARY REASON FOR VISIT:  Encounter Diagnoses  Name Primary?   Malignant neoplasm of lower-inner quadrant of left breast in female, estrogen receptor negative (HCC) Yes   Family history of breast cancer     HISTORY OF PRESENT ILLNESS:   [name], a 78 y.o. female, was seen for a [place] cancer genetics consultation at the request of Dr. Arno Bibles due to a personal and family history of cancer.  [name] presents to clinic today to discuss the possibility of a hereditary predisposition to cancer, to discuss genetic testing, and to further clarify her future cancer risks, as well as potential cancer risks for family members.   In May 2025, at the age of 35, Ms. Mcadams was diagnosed with invasive ductal carcinoma of the left breast (ER/PR/HER2 negative). She also has a history of invasive ductal carcinoma of the left breast diagnosed at age 70 (ER/PR positive, HER2 negative).   Past Medical History:  Diagnosis Date   Absence of menstruation    amenorrhea   Angioneurotic edema not elsewhere classified    due to ACE-I   Breast cancer (HCC)    Breast disorder    breast cancer 2007   Cancer [facility]) 2007   Left Breast Cancer   Carotid stenosis    Carotid US  (8/15):  RICA 40-59%; LICA 40-59% >>> FU 1 year   H/O: rheumatic fever    History of breast cancer 07/13/2015   HLD (hyperlipidemia)    Occlusion and stenosis of carotid artery without mention of cerebral infarction    Other abnormal glucose    fasting hyperglycemia   Personal history of malignant neoplasm of breast    Personal history of radiation therapy 2007   Unspecified essential hypertension     Past Surgical History:  Procedure Laterality Date   APPENDECTOMY     BREAST BIOPSY Left 12/06/2005   malignant   BREAST BIOPSY Left 08/20/2023   US  LT BREAST BX W LOC DEV 1ST LESION IMG BX SPEC US  GUIDE 08/20/2023 [facility]   BREAST  LUMPECTOMY Left 01/08/2006   BREAST LUMPECTOMY Left 01/08/2006   COLONOSCOPY  2005   negative; Dr Tova Fresh   COLONOSCOPY N/A 09/02/2015   Procedure: COLONOSCOPY;  Surgeon: Alyce Jubilee, MD;  Location: [facility];  Service: Endoscopy;  Laterality: N/A;  1215-moved to 1130 Ginger to notify pt   G0 P0     Dr. Arbie Knock    Social History   Socioeconomic History   Marital status: Single    Spouse name: Not on file   Number of children: Not on file   Years of education: Not on file   Highest education level: Not on file  Occupational History   Not on file  Tobacco Use   Smoking status: Every Day    Current packs/day: 0.50    Average packs/day: 0.5 packs/day for 48.0 years (24.0 ttl pk-yrs)    Types: Cigarettes    Passive exposure: Current   Smokeless tobacco: Never   Tobacco comments:    Smokes daily.    Vaping Use   Vaping status: Never Used  Substance and Sexual Activity   Alcohol use: Yes    Alcohol/week: 1.0 standard drink of alcohol    Types: 1 Standard drinks or equivalent per week    Comment: occ   Drug use: No   Sexual activity: Yes    Birth control/protection: Post-menopausal  Other Topics  Concern   Not on file  Social History Narrative   Single, no children.   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    Social Drivers of Health   Financial Resource Strain: Medium Risk (05/31/2023)   Overall Financial Resource Strain (CARDIA)    Difficulty of Paying Living Expenses: Somewhat hard  Food Insecurity: No Food Insecurity (05/31/2023)   Hunger Vital Sign    Worried About Running Out of Food in the Last Year: Never true    Ran Out of Food in the Last Year: Never true  Transportation Needs: No Transportation Needs (05/31/2023)   PRAPARE - Administrator, Civil Service (Medical): No    Lack of Transportation (Non-Medical): No  Physical Activity: Sufficiently Active (05/31/2023)   Exercise Vital Sign    Days of Exercise per Week: 5 days    Minutes of  Exercise per Session: 60 min  Stress: No Stress Concern Present (05/31/2023)   [employer] of Occupational Health - Occupational Stress Questionnaire    Feeling of Stress : Not at all  Social Connections: Moderately Isolated (05/31/2023)   Social Connection and Isolation Panel [NHANES]    Frequency of Communication with Friends and Family: More than three times a week    Frequency of Social Gatherings with Friends and Family: More than three times a week    Attends Religious Services: 1 to 4 times per year    Active Member of [employer] or Organizations: No    Attends Banker Meetings: Never    Marital Status: Never married     FAMILY HISTORY:  We obtained a detailed, 4-generation family history.  Significant diagnoses are listed below: Family History  Problem Relation Age of Onset   Breast cancer Sister 91   Hypertension Other        some of Pts brothers and sisters   Heart attack Neg Hx    Stroke Neg Hx    Diabetes Neg Hx    Heart disease Neg Hx        Ms. Hemp is unaware of previous family history of genetic testing for hereditary cancer risks. There is no reported Ashkenazi Jewish ancestry.   GENETIC COUNSELING ASSESSMENT: [name] is a 78 y.o. female with a personal and family history of cancer which is somewhat suggestive of a hereditary predisposition to cancer given her personal history of two primary breast cancers one of which is triple negative. We, therefore, discussed and recommended the following at today's visit.   DISCUSSION: We discussed that 5 - 10% of cancer is hereditary, with most cases of breast cancer associated with BRCA1/2.  There are other genes that can be associated with hereditary breast cancer syndromes.  We discussed that testing is beneficial for several reasons including knowing how to follow individuals after completing their treatment, identifying whether potential treatment options would be beneficial, and understanding if other  family members could be at risk for cancer and allowing them to undergo genetic testing.   We reviewed the characteristics, features and inheritance patterns of hereditary cancer syndromes. We also discussed genetic testing, including the appropriate family members to test, the process of testing, insurance coverage and turn-around-time for results. We discussed the implications of a negative, positive, carrier and/or variant of uncertain significant result. We recommended [name] pursue genetic testing for a panel that includes genes associated with breast cancer.   [name] elected to have [facility] Panel. The [facility] CancerNext+RNAinsight Panel includes sequencing, rearrangement analysis,  and RNA analysis for the following 40 genes: APC, ATM, BAP1, BARD1, BMPR1A, BRCA1, BRCA2, BRIP1, CDH1, CDKN2A, CHEK2, FH, FLCN, MET, MLH1, MSH2, MSH6, MUTYH, NF1, NTHL1, PALB2, PMS2, PTEN, RAD51C, RAD51D, RPS20, SMAD4, STK11, TP53, TSC1, TSC2, and VHL (sequencing and deletion/duplication); AXIN2, HOXB13, MBD4, MSH3, POLD1 and POLE (sequencing only); EPCAM and GREM1 (deletion/duplication only).   Based on [name]'s personal and family history of cancer, she meets medical criteria for genetic testing. Despite that she meets criteria, she may still have an out of pocket cost. We discussed that if her out of pocket cost for testing is over $100, the laboratory should contact them to discuss self-pay prices, patient pay assistance programs, if applicable, and other billing options.  PLAN: After considering the risks, benefits, and limitations, [name] provided informed consent to pursue genetic testing and the blood sample was sent to [facility] for analysis of the CancerNext Panel. Results should be available within approximately 2-3 weeks' time, at which point they will be disclosed by telephone to [name], as will any additional recommendations warranted by these results. [name] will receive a  summary of her genetic counseling visit and a copy of her results once available. This information will also be available in Epic.   Ms. Willis questions were answered to her satisfaction today. Our contact information was provided should additional questions or concerns arise. Thank you for the referral and allowing us  to share in the care of your patient.   Genesia Caslin, MS, [facility] Genetic Counselor [email or web address] (P) [phone]  40 minutes were spent on the date of the encounter in service to the patient including preparation, face-to-face consultation, documentation and care coordination.  The patient brought her cousin.  Drs. Gudena and/or Maryalice Smaller were available to discuss this case as needed.   _______________________________________________________________________ For Office Staff:  Number of people involved in session: 2 Was an Intern/ student involved with case: no

## 2023-08-28 NOTE — Assessment & Plan Note (Signed)
 Assessment and Plan Assessment & Plan Triple negative breast cancer  Newly diagnosed 2.8 cm triple negative breast cancer in left breast, 8 o'clock position. No axillary involvement.  Neoadjuvant chemotherapy proposed to downsize the tumor and also for prognostic information. Although she is a healthy 78 year old, I do not believe she will tolerate the standard of care keynote 522 regimen well.  She also is very focused on keeping her quality of life going, she wants to continue to work.  Reviewed adverse effects of chemotherapy including but not limited to fatigue, nausea, vomiting, diarrhea, cytopenias, neuropathy etc. Shorter 12-week regimen with TC chosen for reduced side effects.  I have explained to her that TC neoadjuvant will be inferior to the standard of care keynote 522 regimen.  Mastectomy planned post-chemotherapy due to prior radiation. Cold cap discussed for hair loss mitigation. Treatment efficacy and quality of life considered. - Initiate 12-week chemotherapy regimen with docetaxel/cyclophosphamide. - Consider cold cap use to reduce hair loss. - Plan mastectomy post-chemotherapy due to prior radiation therapy. - Arrange port placement for chemotherapy administration. - Schedule chemotherapy to start the week of June 9th, pending port placement.  Hypertension Hypertension managed with four medications.  Follow-up Coordinate treatment schedule with personal commitments. - Schedule chemotherapy to start the week of June 9th, pending port placement. - Coordinate with Dr. Afton Horse for port placement.

## 2023-08-28 NOTE — Research (Signed)
 V4098, ICE COMPRESS: RANDOMIZED TRIAL OF LIMB CRYOCOMPRESSION VERSUS CONTINUOUS COMPRESSION VERSUS LOW CYCLIC COMPRESSION FOR THE PREVENTION  OF TAXANE-INDUCED PERIPHERAL NEUROPATHY  Patient [name] was identified by Dr. Arno Bibles as a potential candidate for the above listed study.  This Clinical Research Nurse met with [name], [name], on 08/28/23 in a manner and location that ensures patient privacy to discuss participation in the above listed research study.  Patient is Unaccompanied.  A copy of the informed consent document and separate HIPAA Authorization was provided to the patient.  Patient reads, speaks, and understands [place].   Patient was provided with the business card of this Nurse and encouraged to contact the research team with any questions.  Approximately 20 minutes were spent with the patient reviewing the informed consent documents.  Patient was provided the option of taking informed consent documents home to review and was encouraged to review at their convenience with their support network, including other care providers. Patient is comfortable with making a decision regarding study participation today.  Pt was introduced to study and Research Rn answered questions. Pt elected not to participate in study. Pt sited she had a bad experience with research in the past and did not want to do anymore research studies.    [name] BSN RN Clinical Research Nurse [facility] Direct Dial: [phone] 08/28/2023  4:03 PM

## 2023-08-28 NOTE — Therapy (Signed)
 OUTPATIENT PHYSICAL THERAPY BREAST CANCER BASELINE EVALUATION   Patient Name: [name] MRN: [id number] DOB:02/16/1946, 78 y.o., female Today's Date: 08/28/2023  END OF SESSION:  PT End of Session - 08/28/23 1308     Visit Number 1    Number of Visits 2    Date for PT Re-Evaluation 02/28/24    PT Start Time 1014    PT Stop Time 1042    PT Time Calculation (min) 28 min    Activity Tolerance Patient tolerated treatment well    Behavior During Therapy [facility] for tasks assessed/performed             Past Medical History:  Diagnosis Date   Absence of menstruation    amenorrhea   Angioneurotic edema not elsewhere classified    due to ACE-I   Breast cancer (HCC)    Breast disorder    breast cancer 2007   Cancer [facility]) 2007   Left Breast Cancer   Carotid stenosis    Carotid US  (8/15):  RICA 40-59%; LICA 40-59% >>> FU 1 year   H/O: rheumatic fever    History of breast cancer 07/13/2015   HLD (hyperlipidemia)    Occlusion and stenosis of carotid artery without mention of cerebral infarction    Other abnormal glucose    fasting hyperglycemia   Personal history of malignant neoplasm of breast    Personal history of radiation therapy 2007   Unspecified essential hypertension    Past Surgical History:  Procedure Laterality Date   APPENDECTOMY     BREAST BIOPSY Left 12/06/2005   malignant   BREAST BIOPSY Left 08/20/2023   US  LT BREAST BX W LOC DEV 1ST LESION IMG BX SPEC US  GUIDE 08/20/2023 [facility] MAMMOGRAPHY   BREAST LUMPECTOMY Left 01/08/2006   BREAST LUMPECTOMY Left 01/08/2006   COLONOSCOPY  2005   negative; Dr Tova Fresh   COLONOSCOPY N/A 09/02/2015   Procedure: COLONOSCOPY;  Surgeon: Alyce Jubilee, MD;  Location: [facility];  Service: Endoscopy;  Laterality: N/A;  1215-moved to 1130 Ginger to notify pt   G0 P0     Dr. Arbie Knock   Patient Active Problem List   Diagnosis Date Noted   Malignant neoplasm of lower-inner quadrant of left breast in female, estrogen receptor  negative (HCC) 08/26/2023   CKD (chronic kidney disease) stage 3, GFR 30-59 ml/min (HCC) 06/16/2023   Muscle cramping 07/09/2022   Weight loss 05/04/2022   Subacute cough 05/04/2022   Breast lump on right side at 11 o'clock position 12/05/2021   Vitamin D  deficiency 12/04/2021   Low vitamin B12 level 12/04/2021   Clavicle enlargement 07/23/2018   Osteopenia 06/14/2018   Achilles tendinitis 01/29/2017   Bilateral carotid artery occlusion 01/29/2017   Neuropathy at site of breast surgery 01/28/2017   Prediabetes 07/24/2015   GERD (gastroesophageal reflux disease) 07/20/2015   Vitiligo 07/07/2012   PLANTAR FASCIITIS 06/16/2010   Tobacco dependence due to cigarettes 06/14/2009   Hyperlipidemia 11/10/2008   Angioedema 01/09/2008   Hypertension 06/12/2007   Personal history of malignant neoplasm of breast 02/25/2007   RHEUMATIC FEVER, HX OF 02/21/2007   REFERRING PROVIDER: Dr. Sim Dryer  REFERRING DIAG: Left breast cancer  THERAPY DIAG:  Malignant neoplasm of lower-inner quadrant of left breast in female, estrogen receptor negative (HCC)  Abnormal posture  Rationale for Evaluation and Treatment: Rehabilitation  ONSET DATE: 08/15/2023  SUBJECTIVE:  SUBJECTIVE STATEMENT: Patient reports she is here today to be seen by her medical team for her newly diagnosed left breast cancer.   PERTINENT HISTORY:  Patient was diagnosed on 08/15/2023 with left grade 3 invasive ductal carcinoma breast cancer. It measures 2.8 cm and is located in the lower inner quadrant. It is triple negative with a Ki67 of 80%. She has a history of left breast cancer in 2007 with a lumpectomy (1 negative node removed) and radiation.  PATIENT GOALS:   reduce lymphedema risk and learn post op HEP.   PAIN:  Are you having pain?  No  PRECAUTIONS: Active CA; already at risk for left UE lymphedema  RED FLAGS: None   HAND DOMINANCE: right  WEIGHT BEARING RESTRICTIONS: No  FALLS:  Has patient fallen in last 6 months? 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  COGNITION: Overall cognitive status: Within functional limits for tasks assessed    POSTURE:  Forward head and rounded shoulders posture  UPPER EXTREMITY AROM/PROM:  A/PROM RIGHT   eval   Shoulder extension 60  Shoulder flexion 149  Shoulder abduction 141  Shoulder internal rotation 62  Shoulder external rotation 70    (Blank rows = not tested)  A/PROM LEFT   eval  Shoulder extension 60  Shoulder flexion 129  Shoulder abduction 137  Shoulder internal rotation 54  Shoulder external rotation 89    (Blank rows = not tested)  CERVICAL AROM: All within normal limits  UPPER EXTREMITY STRENGTH: WNL  LYMPHEDEMA ASSESSMENTS (in cm):   LANDMARK RIGHT   eval  10 cm proximal to olecranon process 30.2  Olecranon process 24.1  10 cm proximal to ulnar styloid process 21.1  Just proximal to ulnar styloid process 15.8  Across hand at thumb web space 20  At base of 2nd digit 6.4  (Blank rows = not tested)  LANDMARK LEFT   eval  10 cm proximal to olecranon process 29.5  Olecranon process 24.6  10 cm proximal to ulnar styloid process 21.2  Just proximal to ulnar styloid process 15.8  Across hand at thumb web space 19.7  At base of 2nd digit 6.2  (Blank rows = not tested)  L-DEX LYMPHEDEMA SCREENING:  The patient was assessed using the L-Dex machine today to produce a lymphedema index baseline score. The patient will  be reassessed on a regular basis (typically every 3 months) to obtain new L-Dex scores. If the score is > 6.5 points away from his/her baseline score indicating onset of subclinical lymphedema, it will be recommended to wear a compression garment for 4 weeks, 12 hours per day and then be reassessed. If the score continues to be > 6.5 points from baseline at reassessment, we will initiate lymphedema treatment. Assessing in this manner has a 95% rate of preventing clinically significant lymphedema.   L-DEX FLOWSHEETS - 08/28/23 1300       L-DEX LYMPHEDEMA SCREENING   Measurement Type Unilateral    L-DEX MEASUREMENT EXTREMITY Upper Extremity    POSITION  Standing    DOMINANT SIDE Right    At Risk Side Left    BASELINE SCORE (UNILATERAL) 10.6   Could be related to her history of  breast cancer            QUICK DASH SURVEY:  Cindia Crease - 08/28/23 0001     Open a tight or new jar No difficulty    Do heavy household chores (wash walls, wash floors) No difficulty    Carry a shopping bag or briefcase No difficulty    Wash your back No difficulty    Use a knife to cut food No difficulty    Recreational activities in which you take some force or impact through your arm, shoulder, or hand (golf, hammering, tennis) Moderate difficulty    During the past week, to what extent has your arm, shoulder or hand problem interfered with your normal social activities with family, friends, neighbors, or groups? Not at all    During the past week, to what extent has your arm, shoulder or hand problem limited your work or other regular daily activities Not at all    Arm, shoulder, or hand pain. None    Tingling (pins and needles) in your arm, shoulder, or hand None    Difficulty Sleeping No difficulty    DASH Score 4.55 %              PATIENT EDUCATION:  Education details: Time spent educating patient on aspects of self-care to maximize post op recovery. Patient was educated on where and how to get a  post op compression bra to use to reduce post op edema. Patient was also educated on the use of SOZO screenings and surveillance principles for early identification of lymphedema onset. She was instructed to use the post op pillow in the axilla for pressure and pain relief. Patient educated on lymphedema risk reduction and post op shoulder/posture HEP. Person educated: Patient Education method: Explanation, Demonstration, Handout Education comprehension: Patient verbalized understanding and returned demonstration  HOME EXERCISE PROGRAM: Patient was instructed today in a home exercise program today for post op shoulder range of motion. These included active assist shoulder flexion in sitting, scapular retraction, wall walking with shoulder abduction, and hands behind head external rotation.  She was encouraged to do these twice a day, holding 3 seconds and repeating 5 times when permitted by her physician.   ASSESSMENT:  CLINICAL IMPRESSION: Patient was diagnosed on 08/15/2023 with left grade 3 invasive ductal carcinoma breast cancer. It measures 2.8 cm and is located in the lower inner quadrant. It is triple negative with a Ki67 of 80%. She has a history of left breast cancer in 2007 with a lumpectomy (1 negative node removed) and radiation.Her multidisciplinary medical team met prior to her assessments to determine a recommended treatment plan. She is planning to have neoadjuvant chemotherapy followed by a left mastectomy and sentinel node biopsy. She will benefit from a post op PT reassessment to determine needs and from L-Dex screens every 3 months for 2 years to detect subclinical lymphedema.  Pt will benefit from skilled therapeutic intervention to improve on the following deficits: Decreased knowledge of precautions, impaired UE functional use, pain, decreased ROM, postural dysfunction.   PT treatment/interventions: ADL/self-care home management, pt/family education, therapeutic exercise  REHAB  POTENTIAL: Excellent  CLINICAL DECISION MAKING: Stable/uncomplicated  EVALUATION COMPLEXITY: Low   GOALS: Goals reviewed with patient? YES  LONG TERM GOALS: (STG=LTG)    Name Target Date Goal status  1 Pt will be able to verbalize understanding of pertinent lymphedema risk reduction practices relevant to her dx specifically related to skin care.  Baseline:  No knowledge 08/28/2023 Achieved at eval  2 Pt will be able to return demo and/or verbalize understanding of the post op HEP related to regaining shoulder ROM. Baseline:  No knowledge 08/28/2023 Achieved at eval  3 Pt will be able to verbalize understanding of the importance of viewing the post op After Breast CA Class video for further lymphedema risk reduction education and therapeutic exercise.  Baseline:  No knowledge 08/28/2023 Achieved at eval  4 Pt will demo she has regained full shoulder ROM and function post operatively compared to baselines.  Baseline: See objective measurements taken today. 02/28/2024     PLAN:  PT FREQUENCY/DURATION: EVAL and 1 follow up appointment.   PLAN FOR NEXT SESSION: will reassess 3-4 weeks post op to determine needs.   Patient will follow up at outpatient cancer rehab 3-4 weeks following surgery.  If the patient requires physical therapy at that time, a specific plan will be dictated and sent to the referring physician for approval. The patient was educated today on appropriate basic range of motion exercises to begin post operatively and the importance of viewing the After Breast Cancer class video following surgery.  Patient was educated today on lymphedema risk reduction practices as it pertains to recommendations that will benefit the patient immediately following surgery.  She verbalized good understanding.    Physical Therapy Information for After Breast Cancer Surgery/Treatment:  Lymphedema is a swelling condition that you may be at risk for in your arm if you have lymph nodes removed  from the armpit area.  After a sentinel node biopsy, the risk is approximately 5-9% and is higher after an axillary node dissection.  There is treatment available for this condition and it is not life-threatening.  Contact your physician or physical therapist with concerns. You may begin the 4 shoulder/posture exercises (see additional sheet) when permitted by your physician (typically a week after surgery).  If you have drains, you may need to wait until those are removed before beginning range of motion exercises.  A general recommendation is to not lift your arms above shoulder height until drains are removed.  These exercises should be done to your tolerance and gently.  This is not a "no pain/no gain" type of recovery so listen to your body and stretch into the range of motion that you can tolerate, stopping if you have pain.  If you are having immediate reconstruction, ask your plastic surgeon about doing exercises as he or she may want you to wait. We encourage you to view the After Breast Cancer class video following surgery.  You will learn information related to lymphedema risk, prevention and treatment and additional exercises to regain mobility following surgery.   While undergoing any medical procedure or treatment, try to avoid blood pressure being taken or needle sticks from occurring on the arm on the side of cancer.   This recommendation begins after surgery and continues for the rest of your life.  This may help reduce your risk of getting lymphedema (swelling in your arm). An excellent resource for those seeking information on lymphedema is the National Lymphedema Network's web site. It can be accessed at www.lymphnet.org If you notice swelling in your hand, arm or breast at any time following surgery (even if it is many years from now), please contact your doctor or physical therapist to discuss this.  Lymphedema can be treated at any time but it is easier for you if it is treated early on.   If you feel like your shoulder motion is not returning to  normal in a reasonable amount of time, please contact your surgeon or physical therapist.  [facility] [phone]. [address], [place] [address]  [facility] CLASS After Breast Cancer Class  After Breast Cancer Class is a specially designed exercise class video to assist you in a safe recover after having breast cancer surgery.  In this video you will learn how to get back to full function whether your drains were just removed or if you had surgery a month ago. The video can be viewed on this page: [email or web address].boyd-meyer.org/ or on YouTube here: [email or web address].  Class Goals  Understand specific stretches to improve the flexibility of you chest and shoulder. Learn ways to safely strengthen your upper body and improve your posture. Understand the warning signs of infection and why you may be at risk for an arm infection. Learn about Lymphedema and prevention.  ** You do not need to view this video until after surgery.  Drains should be removed to participate in the recommended exercises on the video.  Patient was instructed today in a home exercise program today for post op shoulder range of motion. These included active assist shoulder flexion in sitting, scapular retraction, wall walking with shoulder abduction, and hands behind head external rotation.  She was encouraged to do these twice a day, holding 3 seconds and repeating 5 times when permitted by her physician.  Rollin Clock, [place] 08/28/23 1:14 PM

## 2023-08-28 NOTE — Research (Signed)
 DCP-001: Use of a Clinical Trial Screening Tool to Address Cancer Health Disparities in the [employer] Community Oncology Research Program [facility])  Patient [name] was identified by Dr. Arno Bibles as a potential candidate for the above listed study.  This Clinical Research Nurse met with [name], [id number], on 08/28/23 in a manner and location that ensures patient privacy to discuss participation in the above listed research study.  Patient is Unaccompanied.  A copy of the informed consent document and separate HIPAA Authorization was provided to the patient.  Patient reads, speaks, and understands [place].   Patient was provided with the business card of this Nurse and encouraged to contact the research team with any questions.  Approximately 10 minutes were spent with the patient reviewing the informed consent documents.  Patient was provided the option of taking informed consent documents home to review and was encouraged to review at their convenience with their support network, including other care providers. Patient is comfortable with making a decision regarding study participation today.  Pt was introduced to study and questions were answered by research staff. Pt elected not to participate in this study. Pt sited a bad experience with research in the past and did not want to participate in anymore research studies.   [name] BSN RN Clinical Research Nurse [facility] Direct Dial: [phone] 08/28/2023  4:06 PM

## 2023-08-28 NOTE — Research (Signed)
 [employer] 2021-05 - Specimen Collection Study to Evaluate Biomarkers in Subjects with Cancer   Patient [name] was identified by Dr. Arno Bibles as a potential candidate for the above listed study.  This Clinical Research Nurse met with [name], [id number], on 08/28/23 in a manner and location that ensures patient privacy to discuss participation in the above listed research study.  Patient is Unaccompanied.  A copy of the informed consent document with embedded HIPAA language was provided to the patient.  Patient reads, speaks, and understands [place].   Patient was provided with the business card of this Nurse and encouraged to contact the research team with any questions.  Approximately 20 minutes were spent with the patient reviewing the informed consent documents.  Patient was provided the option of taking informed consent documents home to review and was encouraged to review at their convenience with their support network, including other care providers. Patient is comfortable with making a decision regarding study participation today.  Pt was introduced to study and elected not to participate. She stated she had a bad experience with research and blood draws in the past. Rn answered pt questions regarding this study for pt.   [name] BSN RN Clinical Research Nurse [facility] Direct Dial: [phone] 08/28/2023  4:00 PM

## 2023-08-28 NOTE — Progress Notes (Signed)
 START ON PATHWAY REGIMEN - Breast     A cycle is every 21 days:     Cyclophosphamide      Docetaxel   **Always confirm dose/schedule in your pharmacy ordering system**  Patient Characteristics: Preoperative or Nonsurgical Candidate, M0 (Clinical Staging), Up to cT4c, Any N, M0, Neoadjuvant Therapy followed by Surgery, Invasive Disease, Chemotherapy, HER2 Negative, ER Negative, Platinum Therapy Not Indicated Therapeutic Status: Preoperative or Nonsurgical Candidate, M0 (Clinical Staging) AJCC M Category: cM0 AJCC Grade: G3 ER Status: Negative (-) AJCC 8 Stage Grouping: IIB HER2 Status: Negative (-) AJCC T Category: cT2 AJCC N Category: cN0 PR Status: Negative (-) Breast Surgical Plan: Neoadjuvant Therapy followed by Surgery Intent of Therapy: Curative Intent, Discussed with Patient

## 2023-08-29 ENCOUNTER — Other Ambulatory Visit: Payer: Self-pay

## 2023-08-30 ENCOUNTER — Ambulatory Visit: Payer: Self-pay | Admitting: Hematology and Oncology

## 2023-08-30 ENCOUNTER — Encounter: Payer: Self-pay | Admitting: Hematology and Oncology

## 2023-09-03 ENCOUNTER — Other Ambulatory Visit: Payer: Self-pay | Admitting: Hematology and Oncology

## 2023-09-03 NOTE — Progress Notes (Deleted)
 [facility]       Telephone: [phone]?Fax: [phone]   Oncology Clinical Pharmacist Practitioner Initial Assessment  Modelle [name] is a 78 y.o. female with a diagnosis of breast cancer. They were contacted today via in-person visit.  Indication/Regimen Docetaxel (Taxotere) and cyclophosphamide (Cytoxan) are being used appropriately for treatment of breast cancer by Dr. Murleen Arms.     Wt Readings from Last 1 Encounters:  08/28/23 161 lb 14.4 oz (73.4 kg)    Estimated body surface area is 1.82 meters squared as calculated from the following:   Height as of 06/17/23: 5\' 4"  (1.626 m).   Weight as of 08/28/23: 161 lb 14.4 oz (73.4 kg).  The dosing regimen cycle is every 21 days x 4 cycles  Docetaxel (75 mg/m2) on Day 1 Cyclophosphamide (600 mg/m2) on Day 1 Pegfilgrastim (6 mg) on Day 3  It is planned to continue until treatment plan completion or unacceptable toxicity. The tentative start date is: 09/16/23. We reviewed her yellow dye allergy since the prescriptions for dexamethasone, ondansetron, and prochlorperazine all flag for possible yellow dye allergy. Dr. Arno Bibles did not sign the prescriptions because she was concerned about the alert. ***  Dose Modifications None   Allergies Allergies  Allergen Reactions   Benazepril Hcl     REACTION: FACE SWELLING (ANGIOEDEMA); she can not take ARBS  !!! Because of a history of documented adverse serious drug reaction;Medi Alert bracelet  is recommended   Peanut-Containing Drug Products     03/13/13 hivesw/o facial swelling   Shellfish Allergy     03/15/13 hives & facial swelling   Yellow Dyes (Non-Tartrazine)     12/14 hives  & facial swelling with Arlis Bent Aid   Vitals: No vitals or labs were done today for this chemotherapy education visit     08/28/2023    8:29 AM 08/28/2023    8:28 AM 06/17/2023   10:49 AM  Oncology Vitals  Height   163 cm  Weight  73.437 kg 73.483 kg  Weight (lbs)  161 lbs 14 oz 162  lbs  BMI  27.79 kg/m2 27.81 kg/m2  Temp  98.5 F (36.9 C) 97.6 F (36.4 C)  Pulse Rate  51 68  BP 138/49 158/62 146/90  Resp  17   SpO2  97 % 95 %  BSA (m2)  1.82 m2 1.82 m2    Laboratory Data    Latest Ref Rng & Units 08/28/2023   12:01 PM 05/04/2022    1:55 PM 12/05/2021    9:41 AM  CBC EXTENDED  WBC 4.0 - 10.5 K/uL 7.5  9.4  7.6   RBC 3.87 - 5.11 MIL/uL 4.88  4.26  4.34   Hemoglobin 12.0 - 15.0 g/dL 29.5  62.1  30.8   HCT 36.0 - 46.0 % 41.7  36.5  37.0   Platelets 150 - 400 K/uL 242  279.0  242.0   NEUT# 1.7 - 7.7 K/uL 4.2  4.6  3.9   Lymph# 0.7 - 4.0 K/uL 2.7  4.1  3.1        Latest Ref Rng & Units 08/28/2023   12:01 PM 05/04/2022    1:55 PM 12/05/2021    9:41 AM  CMP  Glucose 70 - 99 mg/dL 657  90  846   BUN 8 - 23 mg/dL 42  26  24   Creatinine 0.44 - 1.00 mg/dL 9.62  9.52  8.41   Sodium 135 - 145 mmol/L 139  140  142   Potassium 3.5 - 5.1 mmol/L 3.8  4.1  3.6   Chloride 98 - 111 mmol/L 103  103  105   CO2 22 - 32 mmol/L 25  26  24    Calcium  8.9 - 10.3 mg/dL 9.9  9.6  9.1   Total Protein 6.5 - 8.1 g/dL 8.2  7.8  7.4   Total Bilirubin 0.0 - 1.2 mg/dL 0.7  0.6  0.6   Alkaline Phos 38 - 126 U/L 66  69  79   AST 15 - 41 U/L 12  15  12    ALT 0 - 44 U/L 7  9  14     Contraindications Contraindications were reviewed? {yes/no:20286} Contraindications to therapy were identified? {YES/NO:21197}  Safety Precautions The following safety precautions were reviewed:  Fever: reviewed the importance of having a thermometer and the Centers for Disease Control and Prevention (CDC) definition of fever which is 100.14F (38C) or higher. Patient should call 24/7 triage at [phone] if experiencing a fever or any other symptoms Decreased white blood cells (WBCs) and increased risk for infection Decreased platelet count and increased risk of bleeding Decreased hemoglobin, part of the red blood cells that carry iron and oxygen Hair Loss Fatigue Fluid retention or swelling  (edema) Peripheral Neuropathy Mouth sores Rash or itchy skin Muscle or joint pain or weakness Nausea or vomiting Diarrhea Nail Changes Hypersensitivity reactions Secondary Malignancies Hemorrhagic cystitis Pneumonitis Handling body fluids and waste Intimacy, sexual activity, contraception, fertility  Medication Reconciliation Current Outpatient Medications  Medication Sig Dispense Refill   amLODipine  (NORVASC ) 10 MG tablet TAKE 1 TABLET BY MOUTH EVERY DAY 90 tablet 2   aspirin  EC 81 MG tablet Take 1 tablet (81 mg total) by mouth daily. 90 tablet 3   atorvastatin  (LIPITOR) 40 MG tablet Take 1 tablet (40 mg total) by mouth daily. 90 tablet 1   gabapentin  (NEURONTIN ) 300 MG capsule TAKE 1 CAPSULE BY MOUTH EVERYDAY AT BEDTIME 90 capsule 1   hydrochlorothiazide  (HYDRODIURIL ) 25 MG tablet TAKE 1 TABLET (25 MG TOTAL) BY MOUTH DAILY. 15 tablet 0   labetalol  (NORMODYNE ) 300 MG tablet TAKE 1 TABLET BY MOUTH 2 TIMES DAILY. 180 tablet 2   omeprazole  (PRILOSEC) 40 MG capsule TAKE 1 CAPSULE BY MOUTH EVERY DAY 90 capsule 0   Vitamin D , Ergocalciferol , (DRISDOL ) 1.25 MG (50000 UNIT) CAPS capsule Take 1 capsule (50,000 Units total) by mouth every 7 (seven) days. 8 capsule 0   No current facility-administered medications for this visit.   Medication reconciliation is based on the patient's most recent medication list in the electronic medical record (EMR) including herbal products and OTC medications.   The patient's medication list was reviewed today with the patient? Yes   Drug-drug interactions (DDIs) DDIs were evaluated? Yes Significant DDIs identified? No   Drug-Food Interactions Drug-food interactions were evaluated? Yes Drug-food interactions identified? No   Follow-up Plan  Treatment start date: 09/16/23  [facility] placement date: Peripheral per patient preference We reviewed the prescriptions, premedications, and treatment regimen with the patient. Possible side effects of the treatment  regimen were reviewed and management strategies were discussed.  Can use loperamide as needed for diarrhea, loratadine as needed for G-CSF bone pain, and Senna-S as needed for constipation.  *** Clinical pharmacy will assist Dr. Praveena Iruku and Sherryann Fleeger on an as needed basis going forward  [name] participated in the discussion, expressed understanding, and voiced agreement with the above plan. All questions were answered to her satisfaction. The  patient was advised to contact the clinic at [phone] with any questions or concerns prior to her return visit.   I spent 60 minutes assessing the patient.  Chalene Treu A. Webb Hake, PharmD, BCOP, CPP  Althea Atkinson, RPH-CPP, 09/03/2023 11:20 AM  **Disclaimer: This note was dictated with voice recognition software. Similar sounding words can inadvertently be transcribed and this note may contain transcription errors which may not have been corrected upon publication of note.**

## 2023-09-04 ENCOUNTER — Inpatient Hospital Stay

## 2023-09-04 ENCOUNTER — Telehealth: Payer: Self-pay

## 2023-09-04 ENCOUNTER — Telehealth: Payer: Self-pay | Admitting: *Deleted

## 2023-09-04 ENCOUNTER — Inpatient Hospital Stay: Admitting: Pharmacist

## 2023-09-04 NOTE — Telephone Encounter (Signed)
 This RN called pt per MD review of labs with noted renal abnormalities- discussed with pt who verified she does not drink a lot when she has to go out.  Reviewed concern with goal that she purposely drink more water  when she is at home and closer to the bathroom for better kidney function.  Pt verbalized understanding.

## 2023-09-04 NOTE — Telephone Encounter (Signed)
 Pt. Verbally confirmed no show for today's appointment, requests to be rescheduled.

## 2023-09-05 ENCOUNTER — Telehealth: Payer: Self-pay | Admitting: *Deleted

## 2023-09-05 ENCOUNTER — Other Ambulatory Visit: Payer: Self-pay | Admitting: Pharmacist

## 2023-09-05 ENCOUNTER — Encounter: Payer: Self-pay | Admitting: *Deleted

## 2023-09-05 NOTE — Progress Notes (Signed)
 Patient prefers to start chemotherapy on 09/19/23 after port placed. Updated dates to cycle 1, day 1 on 09/19/23.

## 2023-09-05 NOTE — Telephone Encounter (Signed)
 Spoke with patient to follow up from [facility] 5/21 and assess navigation needs. Patient denies any questions or concerns at this time.  She is scheduled for her port on 6/11 and chemo start on 6/9. She does not want to use IV for chemo. I have requested her chemo start date be moved to 6/12. Let her know someone would call her with updated apts.

## 2023-09-06 ENCOUNTER — Encounter: Payer: Self-pay | Admitting: *Deleted

## 2023-09-09 ENCOUNTER — Other Ambulatory Visit: Payer: Self-pay | Admitting: Hematology and Oncology

## 2023-09-10 ENCOUNTER — Encounter (HOSPITAL_COMMUNITY): Payer: Self-pay

## 2023-09-10 ENCOUNTER — Other Ambulatory Visit: Payer: Self-pay | Admitting: Hematology and Oncology

## 2023-09-10 NOTE — Pre-Procedure Instructions (Signed)
 Surgical Instructions   Your procedure is scheduled on September 18, 2023. Report to [facility] Main Entrance "A" at 1:15 P.M., then check in with the Admitting office. Any questions or running late day of surgery: call [phone]  Questions prior to your surgery date: call [phone], Monday-Friday, 8am-4pm. If you experience any cold or flu symptoms such as cough, fever, chills, shortness of breath, etc. between now and your scheduled surgery, please notify us  at the above number.     Remember:  Do not eat after midnight the night before your surgery   You may drink clear liquids until 12:15 PM the morning of your surgery.   Clear liquids allowed are: Water , Non-Citrus Juices (without pulp), Carbonated Beverages, Clear Tea (no milk, honey, etc.), Black Coffee Only (NO MILK, CREAM OR POWDERED CREAMER of any kind), and Gatorade.    Take these medicines the morning of surgery with A SIP OF WATER : amLODipine  (NORVASC )  atorvastatin  (LIPITOR)  labetalol  (NORMODYNE )    May take these medicines IF NEEDED: acetaminophen  (TYLENOL )  omeprazole  (PRILOSEC)    One week prior to surgery, STOP taking any Aspirin  (unless otherwise instructed by your surgeon) Aleve , Naproxen , Ibuprofen, Motrin, Advil, Goody's, BC's, all herbal medications, fish oil, and non-prescription vitamins.                     Do NOT Smoke (Tobacco/Vaping) for 24 hours prior to your procedure.  If you use a CPAP at night, you may bring your mask/headgear for your overnight stay.   You will be asked to remove any contacts, glasses, piercing's, hearing aid's, dentures/partials prior to surgery. Please bring cases for these items if needed.    Patients discharged the day of surgery will not be allowed to drive home, and someone needs to stay with them for 24 hours.  SURGICAL WAITING ROOM VISITATION Patients may have no more than 2 support people in the waiting area - these visitors may rotate.   Pre-op nurse will  coordinate an appropriate time for 1 ADULT support person, who may not rotate, to accompany patient in pre-op.  Children under the age of 8 must have an adult with them who is not the patient and must remain in the main waiting area with an adult.  If the patient needs to stay at the hospital during part of their recovery, the visitor guidelines for inpatient rooms apply.  Please refer to the [facility] website for the visitor guidelines for any additional information.   If you received a COVID test during your pre-op visit  it is requested that you wear a mask when out in public, stay away from anyone that may not be feeling well and notify your surgeon if you develop symptoms. If you have been in contact with anyone that has tested positive in the last 10 days please notify you surgeon.      Pre-operative CHG Bathing Instructions   You can play a key role in reducing the risk of infection after surgery. Your skin needs to be as free of germs as possible. You can reduce the number of germs on your skin by washing with CHG (chlorhexidine gluconate) soap before surgery. CHG is an antiseptic soap that kills germs and continues to kill germs even after washing.   DO NOT use if you have an allergy to chlorhexidine/CHG or antibacterial soaps. If your skin becomes reddened or irritated, stop using the CHG and notify one of our RNs at [phone].  TAKE A SHOWER THE NIGHT BEFORE SURGERY AND THE DAY OF SURGERY    Please keep in mind the following:  DO NOT shave, including legs and underarms, 48 hours prior to surgery.   You may shave your face before/day of surgery.  Place clean sheets on your bed the night before surgery Use a clean washcloth (not used since being washed) for each shower. DO NOT sleep with pet's night before surgery.  CHG Shower Instructions:  Wash your face and private area with normal soap. If you choose to wash your hair, wash first with your normal shampoo.   After you use shampoo/soap, rinse your hair and body thoroughly to remove shampoo/soap residue.  Turn the water  OFF and apply half the bottle of CHG soap to a CLEAN washcloth.  Apply CHG soap ONLY FROM YOUR NECK DOWN TO YOUR TOES (washing for 3-5 minutes)  DO NOT use CHG soap on face, private areas, open wounds, or sores.  Pay special attention to the area where your surgery is being performed.  If you are having back surgery, having someone wash your back for you may be helpful. Wait 2 minutes after CHG soap is applied, then you may rinse off the CHG soap.  Pat dry with a clean towel  Put on clean pajamas    Additional instructions for the day of surgery: DO NOT APPLY any lotions, deodorants, cologne, or perfumes.   Do not wear jewelry or makeup Do not wear nail polish, gel polish, artificial nails, or any other type of covering on natural nails (fingers and toes) Do not bring valuables to the hospital. [facility] is not responsible for valuables/personal belongings. Put on clean/comfortable clothes.  Please brush your teeth.  Ask your nurse before applying any prescription medications to the skin.

## 2023-09-11 ENCOUNTER — Ambulatory Visit
Admission: RE | Admit: 2023-09-11 | Discharge: 2023-09-11 | Disposition: A | Discharge status: Home / Self Care | Source: Ambulatory Visit | Attending: Hematology and Oncology

## 2023-09-11 ENCOUNTER — Encounter (HOSPITAL_COMMUNITY): Payer: Self-pay

## 2023-09-11 ENCOUNTER — Encounter: Payer: Self-pay | Admitting: Genetic Counselor

## 2023-09-11 ENCOUNTER — Other Ambulatory Visit: Payer: Self-pay

## 2023-09-11 ENCOUNTER — Encounter (HOSPITAL_COMMUNITY)
Admission: RE | Admit: 2023-09-11 | Discharge: 2023-09-11 | Disposition: A | Discharge status: Home / Self Care | Source: Ambulatory Visit | Attending: Surgery | Admitting: Surgery

## 2023-09-11 ENCOUNTER — Ambulatory Visit: Payer: Self-pay | Admitting: Internal Medicine

## 2023-09-11 ENCOUNTER — Telehealth: Payer: Self-pay | Admitting: Genetic Counselor

## 2023-09-11 DIAGNOSIS — Z01810 Encounter for preprocedural cardiovascular examination: Secondary | ICD-10-CM | POA: Diagnosis not present

## 2023-09-11 DIAGNOSIS — Z1379 Encounter for other screening for genetic and chromosomal anomalies: Secondary | ICD-10-CM | POA: Insufficient documentation

## 2023-09-11 DIAGNOSIS — Z171 Estrogen receptor negative status [ER-]: Secondary | ICD-10-CM

## 2023-09-11 DIAGNOSIS — D0512 Intraductal carcinoma in situ of left breast: Secondary | ICD-10-CM | POA: Diagnosis not present

## 2023-09-11 HISTORY — DX: Gastro-esophageal reflux disease without esophagitis: K21.9

## 2023-09-11 HISTORY — DX: Prediabetes: R73.03

## 2023-09-11 HISTORY — DX: Peripheral vascular disease, unspecified: I73.9

## 2023-09-11 HISTORY — DX: Chronic kidney disease, unspecified: N18.9

## 2023-09-11 HISTORY — DX: Pneumonia, unspecified organism: J18.9

## 2023-09-11 MED ORDER — GADOPICLENOL 0.5 MMOL/ML IV SOLN
7.0000 mL | Freq: Once | INTRAVENOUS | Status: AC | PRN
  Administered 2023-09-11: 7 mL via INTRAVENOUS

## 2023-09-11 NOTE — Progress Notes (Signed)
 PCP - Dr. Oma Bias Cardiologist - Dr. Arnoldo Lapping - Last office visit 11/27/2021. Pt has also been seen at HTN clinic on 02/14/2022. No cardiology follow-ups since 2023.  PPM/ICD - Denies Device Orders - n/a Rep Notified - n/a  Chest x-ray - n/a EKG - 09/11/2023 Stress Test - Per pt, 20+ years ago.  ECHO - 12/18/2021 Cardiac Cath - Denies  Sleep Study - Denies CPAP - n/a  Pt is Pre-DM  Last dose of GLP1 agonist- n/a GLP1 instructions: n/a  Blood Thinner Instructions: n/a Aspirin  Instructions: n/a  ERAS Protcol - Clear liquids until 1215 afternoon of surgery PRE-SURGERY Ensure or G2- n/a  COVID TEST- n/a   Anesthesia review: Yes. Hx of HTN, CKD3, Carotid Artery Stenosis. Elevated BP at pre-op appointment (201/64 and 208/84 with HR 48). Rudy Costain, PA-C made aware and pt instructed to reach out to PCP to discuss options prior to surgery. Pt was also instructed to start checking her BP at home 2-3x/day and recording results.   Patient denies shortness of breath, fever, cough and chest pain at PAT appointment. Pt denies any respiratory illness/infection in the last two months.   All instructions explained to the patient, with a verbal understanding of the material. Patient agrees to go over the instructions while at home for a better understanding. Patient also instructed to self quarantine after being tested for COVID-19. The opportunity to ask questions was provided.

## 2023-09-11 NOTE — Telephone Encounter (Signed)
 Message left for patient to return call to clinic and schedule appointment with Dr. Donnette Gal.

## 2023-09-11 NOTE — Telephone Encounter (Addendum)
 I contacted [name] to discuss her genetic testing results. No pathogenic variants were identified in the 40 genes analyzed. Detailed clinic note to follow.  The test report has been scanned into EPIC and is located under the Molecular Pathology section of the Results Review tab.  A portion of the result report is included below for reference.   Abbie Berling, MS, [facility] Genetic Counselor [email or web address] [phone]) [phone]

## 2023-09-11 NOTE — Telephone Encounter (Signed)
 FYI Only or Action Required?: Action required by provider  Patient was last seen in primary care on 06/17/2023 by [name], MD. Called Nurse Triage reporting Hypertension. Symptoms began today. Interventions attempted: Nothing. Symptoms are: stable.  Triage Disposition: See HCP Within 4 Hours (Or PCP Triage)  Patient/caregiver understands and will follow disposition?: This RN advised pt goes to urgent care as no appointment availability in office. Patient refused. Patient requests a call back from clinic to schedule an appointment at  [phone].             Copied from CRM [id number]. Topic: Clinical - Red Word Triage >> Sep 11, 2023  2:42 PM Marlan Silva wrote: Red Word that prompted transfer to Nurse Triage: Patient called in stating that she was at [facility] to get a port put in, but they did not because her blood pressure was 200/200. Patient states that the doctor at the hospital told her to contact her primary care doctor which is Dr. Donnette Gal for further instructions. Patient states that the doctor from [facility] said that they would be calling Dr. Donnette Gal to follow up with her on the patient as well. Patient denies any other symptoms related to her high blood pressure reading as well. Reason for Disposition  [1] Systolic BP  >= 200 OR Diastolic >= 120 AND [2] having NO cardiac or neurologic symptoms  Answer Assessment - Initial Assessment Questions 1. BLOOD PRESSURE: "What is the blood pressure?" "Did you take at least two measurements 5 minutes apart?"     208 over something 2. ONSET: "When did you take your blood pressure?"     1 PM 3. HOW: "How did you take your blood pressure?" (e.g., automatic home BP monitor, visiting nurse)     At hospital 4. HISTORY: "Do you have a history of high blood pressure?"     Yes 5. MEDICINES: "Are you taking any medicines for blood pressure?" "Have you missed any doses recently?"     Yes, no doses missed 6. OTHER  SYMPTOMS: "Do you have any symptoms?" (e.g., blurred vision, chest pain, difficulty breathing, headache, weakness)     No symptoms  Protocols used: Blood Pressure - High-A-AH

## 2023-09-12 ENCOUNTER — Telehealth: Payer: Self-pay | Admitting: *Deleted

## 2023-09-12 ENCOUNTER — Telehealth: Payer: Self-pay | Admitting: Internal Medicine

## 2023-09-12 ENCOUNTER — Encounter: Payer: Self-pay | Admitting: Internal Medicine

## 2023-09-12 ENCOUNTER — Telehealth: Payer: Self-pay

## 2023-09-12 ENCOUNTER — Encounter: Payer: Self-pay | Admitting: *Deleted

## 2023-09-12 ENCOUNTER — Inpatient Hospital Stay: Admitting: Pharmacist

## 2023-09-12 ENCOUNTER — Inpatient Hospital Stay: Attending: Hematology and Oncology

## 2023-09-12 ENCOUNTER — Other Ambulatory Visit: Payer: Self-pay | Admitting: *Deleted

## 2023-09-12 DIAGNOSIS — Z17421 Hormone receptor negative with human epidermal growth factor receptor 2 negative status: Secondary | ICD-10-CM | POA: Diagnosis not present

## 2023-09-12 DIAGNOSIS — F17210 Nicotine dependence, cigarettes, uncomplicated: Secondary | ICD-10-CM | POA: Diagnosis not present

## 2023-09-12 DIAGNOSIS — Z5111 Encounter for antineoplastic chemotherapy: Secondary | ICD-10-CM | POA: Diagnosis not present

## 2023-09-12 DIAGNOSIS — Z5189 Encounter for other specified aftercare: Secondary | ICD-10-CM | POA: Insufficient documentation

## 2023-09-12 DIAGNOSIS — C50312 Malignant neoplasm of lower-inner quadrant of left female breast: Secondary | ICD-10-CM | POA: Diagnosis not present

## 2023-09-12 DIAGNOSIS — Z171 Estrogen receptor negative status [ER-]: Secondary | ICD-10-CM

## 2023-09-12 MED ORDER — DEXAMETHASONE 4 MG PO TABS: ORAL_TABLET | ORAL | 1 refills | Status: DC

## 2023-09-12 MED ORDER — LIDOCAINE-PRILOCAINE 2.5-2.5 % EX CREA: TOPICAL_CREAM | CUTANEOUS | 3 refills | Status: DC

## 2023-09-12 MED ORDER — EPINEPHRINE 0.3 MG/0.3ML IJ SOAJ: 0.3000 mg | INTRAMUSCULAR | 1 refills | Status: AC | PRN

## 2023-09-12 MED ORDER — PROCHLORPERAZINE MALEATE 10 MG PO TABS: 10.0000 mg | ORAL_TABLET | Freq: Four times a day (QID) | ORAL | 1 refills | Status: DC | PRN

## 2023-09-12 MED ORDER — ONDANSETRON HCL 8 MG PO TABS: 8.0000 mg | ORAL_TABLET | Freq: Three times a day (TID) | ORAL | 1 refills | Status: DC | PRN

## 2023-09-12 NOTE — Telephone Encounter (Signed)
 This RN called pt to discuss MD request due to start of new medications and noted allergies - need for pt to have epipen  available if needed.  Pt verified pharmacy and understanding of request.  Prescription sent to pharmacy

## 2023-09-12 NOTE — Progress Notes (Unsigned)
    Subjective:    Patient ID: [name], female    DOB: [birth year], 78 y.o.   MRN: [id number]      HPI [name] is here for No chief complaint on file.   Elevated BP at pre-op -  201/64 and 208/84,  HR was 48        Medications and allergies reviewed with patient and updated if appropriate.  Current Outpatient Medications on File Prior to Visit  Medication Sig Dispense Refill   acetaminophen  (TYLENOL ) 325 MG tablet Take 650 mg by mouth every 6 (six) hours as needed for moderate pain (pain score 4-6). (Patient not taking: Reported on 09/12/2023)     amLODipine  (NORVASC ) 10 MG tablet TAKE 1 TABLET BY MOUTH EVERY DAY 90 tablet 2   atorvastatin  (LIPITOR) 40 MG tablet Take 1 tablet (40 mg total) by mouth daily. 90 tablet 1   dexamethasone (DECADRON) 4 MG tablet Take 2 tabs by mouth 2 times daily starting day before chemo. Then take 2 tabs daily for 2 days starting day after chemo. Take with food. 30 tablet 1   labetalol  (NORMODYNE ) 300 MG tablet TAKE 1 TABLET BY MOUTH 2 TIMES DAILY. 180 tablet 2   lidocaine-prilocaine (EMLA) cream Apply to affected area once 30 g 3   omeprazole  (PRILOSEC) 40 MG capsule TAKE 1 CAPSULE BY MOUTH EVERY DAY (Patient not taking: Reported on 09/12/2023) 90 capsule 0   ondansetron (ZOFRAN) 8 MG tablet Take 1 tablet (8 mg total) by mouth every 8 (eight) hours as needed for nausea or vomiting. Start on the third day after chemotherapy. 30 tablet 1   prochlorperazine (COMPAZINE) 10 MG tablet Take 1 tablet (10 mg total) by mouth every 6 (six) hours as needed for nausea or vomiting. 30 tablet 1   No current facility-administered medications on file prior to visit.    Review of Systems     Objective:  There were no vitals filed for this visit. BP Readings from Last 3 Encounters:  09/11/23 (!) 208/84  08/28/23 (!) 138/49  06/17/23 (!) 146/90   Wt Readings from Last 3 Encounters:  09/11/23 161 lb 8 oz (73.3 kg)  08/28/23 161 lb 14.4 oz (73.4 kg)  06/17/23  162 lb (73.5 kg)   There is no height or weight on file to calculate BMI.    Physical Exam         Assessment & Plan:    See Problem List for Assessment and Plan of chronic medical problems.

## 2023-09-12 NOTE — Telephone Encounter (Signed)
 Can you call her and ask her to bring her meds in with her or a list of what she is taking.

## 2023-09-12 NOTE — Progress Notes (Signed)
 Pharmacist Chemotherapy Monitoring - Initial Assessment    Anticipated start date: 09/19/23   The following has been reviewed per standard work regarding the patient's treatment regimen: The patient's diagnosis, treatment plan and drug doses, and organ/hematologic function Lab orders and baseline tests specific to treatment regimen  The treatment plan start date, drug sequencing, and pre-medications Prior authorization status  Patient's documented medication list, including drug-drug interaction screen and prescriptions for anti-emetics and supportive care specific to the treatment regimen The drug concentrations, fluid compatibility, administration routes, and timing of the medications to be used The patient's access for treatment and lifetime cumulative dose history, if applicable  The patient's medication allergies and previous infusion related reactions, if applicable   Changes made to treatment plan:  N/A  Follow up needed:  Port placement   Kara Orts, PharmD, MBA

## 2023-09-12 NOTE — Progress Notes (Signed)
 [facility]       Telephone: [phone]?Fax: [phone]   Oncology Clinical Pharmacist Practitioner Initial Assessment  [name] is a 78 y.o. female with a diagnosis of breast cancer. They were contacted today via in-person visit.  Indication/Regimen Docetaxel (Taxotere) and cyclophosphamide (Cytoxan) are being used appropriately for treatment of breast cancer by Dr. Murleen Arms.      Wt Readings from Last 1 Encounters:  09/11/23 161 lb 8 oz (73.3 kg)    Estimated body surface area is 1.82 meters squared as calculated from the following:   Height as of 09/11/23: 5\' 4"  (1.626 m).   Weight as of 09/11/23: 161 lb 8 oz (73.3 kg).  The dosing regimen cycle is every 21 days x 4 cycles  Docetaxel (75 mg/m2) on Day 1 Cyclophosphamide (600 mg/m2) on Day 1 Pegfilgrastim (6 mg) on Day 3  It is planned to continue until treatment plan completion or unacceptable toxicity. The tentative start date is: 09/19/23  Dose Modifications None at this time   Allergies Allergies  Allergen Reactions   Benazepril Hcl     REACTION: FACE SWELLING (ANGIOEDEMA); she can not take ARBS  !!! Because of a history of documented adverse serious drug reaction;Medi Alert bracelet  is recommended   Yellow Dyes (Non-Tartrazine)     12/14 hives  & facial swelling with Lemon Kool Aid   - extensively reviewed with patient her allergy list since her current prescriptions for omeprazole  and labetalol  flagged for the Yellow Dye allergy. She recalls taking benazepril after leaving a hospital and having her face swell and having difficulty breathing. She then went to an allergist who gave her recommendations on items to avoid, including lemon kool aid, but she never had a reaction to the kool aid per her report. She has been taking omeprazole  and labetalol  with no issues for a long time. We reviewed how the prescriptions that Dr. Arno Bibles wants her to take while on chemotherapy (dexamethasone,  ondansetron, and prochlorperazine) also have the potential yellow dye allergy. She has been taking other medications that flagged for the allergy with no issues. We will out of an abundance not have her start the dexamethasone at home the day before chemotherapy for cycle 1 only and instead take only her premedications and chemotherapy while in clinic. Will inquire with Dr. Arno Bibles is she wants to prescribe an Epipen  just in case. Patient said she had one in past but never had to use it  Vitals    09/11/2023    1:38 PM 09/11/2023    1:06 PM 08/28/2023    8:29 AM  Oncology Vitals  Height  163 cm   Weight  73.256 kg   Weight (lbs)  161 lbs 8 oz   BMI  27.72 kg/m2   Temp  100 F (37.8 C)   Pulse Rate  51   BP 208/84 201/64 138/49  Resp  18   SpO2  100 %   BSA (m2)  1.82 m2      Laboratory Data: No labs were done today for this chemotherapy education visit     Latest Ref Rng & Units 08/28/2023   12:01 PM 05/04/2022    1:55 PM 12/05/2021    9:41 AM  CBC EXTENDED  WBC 4.0 - 10.5 K/uL 7.5  9.4  7.6   RBC 3.87 - 5.11 MIL/uL 4.88  4.26  4.34   Hemoglobin 12.0 - 15.0 g/dL 57.8  46.9  62.9   HCT 36.0 -  46.0 % 41.7  36.5  37.0   Platelets 150 - 400 K/uL 242  279.0  242.0   NEUT# 1.7 - 7.7 K/uL 4.2  4.6  3.9   Lymph# 0.7 - 4.0 K/uL 2.7  4.1  3.1        Latest Ref Rng & Units 08/28/2023   12:01 PM 05/04/2022    1:55 PM 12/05/2021    9:41 AM  CMP  Glucose 70 - 99 mg/dL 960  90  454   BUN 8 - 23 mg/dL 42  26  24   Creatinine 0.44 - 1.00 mg/dL 0.98  1.19  1.47   Sodium 135 - 145 mmol/L 139  140  142   Potassium 3.5 - 5.1 mmol/L 3.8  4.1  3.6   Chloride 98 - 111 mmol/L 103  103  105   CO2 22 - 32 mmol/L 25  26  24    Calcium  8.9 - 10.3 mg/dL 9.9  9.6  9.1   Total Protein 6.5 - 8.1 g/dL 8.2  7.8  7.4   Total Bilirubin 0.0 - 1.2 mg/dL 0.7  0.6  0.6   Alkaline Phos 38 - 126 U/L 66  69  79   AST 15 - 41 U/L 12  15  12    ALT 0 - 44 U/L 7  9  14      Contraindications Contraindications were  reviewed? Yes Contraindications to therapy were identified? No   Safety Precautions The following safety precautions were reviewed:  Fever: reviewed the importance of having a thermometer and the Centers for Disease Control and Prevention (CDC) definition of fever which is 100.52F (38C) or higher. Patient should call 24/7 triage at [phone] if experiencing a fever or any other symptoms Decreased white blood cells (WBCs) and increased risk for infection Decreased platelet count and increased risk of bleeding Decreased hemoglobin, part of the red blood cells that carry iron and oxygen Hair Loss Fatigue Fluid retention or swelling (edema) Peripheral Neuropathy Mouth sores Rash or itchy skin Muscle or joint pain or weakness Nausea or vomiting Diarrhea Nail Changes Hypersensitivity reactions Secondary Malignancies Hemorrhagic cystitis Pneumonitis Handling body fluids and waste Intimacy, sexual activity, contraception, fertility  Medication Reconciliation Current Outpatient Medications  Medication Sig Dispense Refill   amLODipine  (NORVASC ) 10 MG tablet TAKE 1 TABLET BY MOUTH EVERY DAY 90 tablet 2   atorvastatin  (LIPITOR) 40 MG tablet Take 1 tablet (40 mg total) by mouth daily. 90 tablet 1   labetalol  (NORMODYNE ) 300 MG tablet TAKE 1 TABLET BY MOUTH 2 TIMES DAILY. 180 tablet 2   acetaminophen  (TYLENOL ) 325 MG tablet Take 650 mg by mouth every 6 (six) hours as needed for moderate pain (pain score 4-6). (Patient not taking: Reported on 09/12/2023)     dexamethasone (DECADRON) 4 MG tablet Take 2 tabs by mouth 2 times daily starting day before chemo. Then take 2 tabs daily for 2 days starting day after chemo. Take with food. 30 tablet 1   lidocaine-prilocaine (EMLA) cream Apply to affected area once 30 g 3   omeprazole  (PRILOSEC) 40 MG capsule TAKE 1 CAPSULE BY MOUTH EVERY DAY (Patient not taking: Reported on 09/12/2023) 90 capsule 0   ondansetron (ZOFRAN) 8 MG tablet Take 1 tablet (8 mg  total) by mouth every 8 (eight) hours as needed for nausea or vomiting. Start on the third day after chemotherapy. 30 tablet 1   prochlorperazine (COMPAZINE) 10 MG tablet Take 1 tablet (10 mg total) by mouth every 6 (  six) hours as needed for nausea or vomiting. 30 tablet 1   No current facility-administered medications for this visit.    Medication reconciliation is based on the patient's most recent medication list in the electronic medical record (EMR) including herbal products and OTC medications.   The patient's medication list was reviewed today with the patient? Yes   Drug-drug interactions (DDIs) DDIs were evaluated? Yes Significant DDIs identified? No   Drug-Food Interactions Drug-food interactions were evaluated? Yes Drug-food interactions identified? No   Follow-up Plan  Treatment start date: 09/19/23 Port placement date: 09/18/23 BP elevated yesterday. Checked today and much lower. Asymptomatic. Should be monitored while on treatment We reviewed the prescriptions, premedications, and treatment regimen with the patient. Possible side effects of the treatment regimen were reviewed and management strategies were discussed.  Can use loperamide as needed for diarrhea, loratadine as needed for G-CSF bone pain, and Senna-S as needed for constipation.  As above, reviewed allergies extensively with patient. Out of an abundance of caution. She will not start dexamethasone at home for cycle 1 since there is possibility of yellow dye allergy. Although, she has been taking labetalol  and omeprazole , which flagged for same allergy, with no issues for a long time. She will see how she tolerates cycle 1 first. Patient was in agreement to this plan and Dr. Arno Bibles made aware. Will see if Dr. Arno Bibles would also like to prescribe an Epipen  for home use if applicable. Patient had one in past but never had to use it per her report Clinical pharmacy will assist Dr. Praveena Iruku and Keymani Glynn on an as  needed basis going forward  Tera Pellicane participated in the discussion, expressed understanding, and voiced agreement with the above plan. All questions were answered to her satisfaction. The patient was advised to contact the clinic at [phone] with any questions or concerns prior to her return visit.   I spent 60 minutes assessing the patient.  Sharmain Lastra A. Webb Hake, PharmD, BCOP, CPP  Althea Atkinson, RPH-CPP, 09/12/2023 12:26 PM  **Disclaimer: This note was dictated with voice recognition software. Similar sounding words can inadvertently be transcribed and this note may contain transcription errors which may not have been corrected upon publication of note.**

## 2023-09-12 NOTE — Telephone Encounter (Signed)
 [name] arrived after education class "as instructed by another form staff member who has something for me to sign".  Obtained signed HIPAA Authorization only.  Unable to wait for further assistance.  Would like to be called if anything further is needed.   "I have another appointment." Currently no further questions or needs.[name]

## 2023-09-12 NOTE — Telephone Encounter (Signed)
 Spoke with patient today and she will bring meds with her tomorrow.

## 2023-09-12 NOTE — Telephone Encounter (Signed)
 Notified the pt regarding her FMLA forms being completed. Pt verbalized understanding and will be picking up her copy tomorrow 09/13/2023. No questions or concerns at this time.

## 2023-09-13 ENCOUNTER — Ambulatory Visit (INDEPENDENT_AMBULATORY_CARE_PROVIDER_SITE_OTHER): Admitting: Internal Medicine

## 2023-09-13 VITALS — BP 144/80 | HR 50 | Temp 97.9°F | Ht 64.0 in | Wt 162.0 lb

## 2023-09-13 DIAGNOSIS — I10 Essential (primary) hypertension: Secondary | ICD-10-CM | POA: Diagnosis not present

## 2023-09-13 DIAGNOSIS — F17210 Nicotine dependence, cigarettes, uncomplicated: Secondary | ICD-10-CM

## 2023-09-13 MED ORDER — AMLODIPINE BESYLATE 10 MG PO TABS: 10.0000 mg | ORAL_TABLET | Freq: Every day | ORAL | 2 refills | Status: DC

## 2023-09-13 MED ORDER — HYDRALAZINE HCL 50 MG PO TABS: 50.0000 mg | ORAL_TABLET | Freq: Three times a day (TID) | ORAL | 1 refills | Status: DC

## 2023-09-13 MED ORDER — BUPROPION HCL ER (XL) 150 MG PO TB24: 150.0000 mg | ORAL_TABLET | Freq: Every day | ORAL | 5 refills | Status: DC

## 2023-09-13 NOTE — Patient Instructions (Addendum)
       Medications changes include :   start hydralazine  50 mg three times a day, start wellbutrin  xl 150 mg daily       Return in about 2 months (around 11/13/2023) for hypertension.

## 2023-09-13 NOTE — Assessment & Plan Note (Signed)
 Smoking cessation was discussed for 4-1/2 minutes.  The patient was counseled on the dangers of tobacco use, and was advised to quit and she does want to quit..  She is interested in having help for quitting, but does not want to take anything that contains nicotine.  Discussed that the patches, gum, lozenges and e-cigarettes all contain nicotine.    Discussed pharmacotherapy (wellbutrin  and chantix ).  Discussed side effects of each of them.  Her sister did not tolerate [name]  and she is concerned about the side effects.  Will try Wellbutrin  XL 150 mg daily-I think the once a day option would be better for her and would probably increase compliance.  This does contain yellow dye, but so it is the hydralazine  and she has been taking that without any issues and she understands that this could cause an allergy but still wants to try it

## 2023-09-13 NOTE — Assessment & Plan Note (Signed)
 Chronic Not controlled Continue amlodipine  10 mg daily Continue labetalol  300 mg twice daily-stressed that this has to be taken twice a day Start hydralazine  50 mg 3 times daily-stressed that this needs to be taken 3 times daily Monitor blood pressure at home-discussed goal Asked her to update me via MyChart Discussed the importance of good blood pressure control especially given her decreased kidney function Follow-up with me in 2 months, sooner if needed

## 2023-09-16 ENCOUNTER — Ambulatory Visit

## 2023-09-16 ENCOUNTER — Ambulatory Visit: Admitting: Hematology and Oncology

## 2023-09-16 ENCOUNTER — Other Ambulatory Visit

## 2023-09-16 NOTE — Progress Notes (Signed)
 Anesthesia Chart Review:  78 year old female with pertinent history including current smoker, HTN, CKD 3, GERD on PPI, breast cancer.  Follows with oncology for history of breast cancer.  Initially had left breast cancer in 2007 treated with surgery and adjuvant radiation.  Now with newly diagnosed 2.8 cm triple negative breast cancer in left breast. No axillary involvement.  She has been recommended to undergo neoadjuvant chemotherapy followed by mastectomy.  Blood pressure noted to be uncontrolled at preadmission testing appointment.  Initially 201/64 and 208/84 on recheck.  Heart rate 48 by EKG.  Patient denied any symptoms of chest pain, palpitations, headache.  Discussed the importance of proper blood pressure management.  Patient reported taking medications as prescribed.  She was advised to reach out to her PCP for further optimization prior to surgery.  Patient seen by PCP Dr. Oma Bias on 09/13/2023.  Per note, patient had only been taking labetalol  300 mg once daily rather than twice daily as prescribed.  She was continued on amlodipine  10 mg daily.  Hydralazine  50 mg 3 times daily was added.  Patient stated she did buy a blood pressure cuff so that she could monitor at home.  Preop labs reviewed, creatinine elevated 1.49 consistent with history of CKD, otherwise unremarkable.  EKG 09/11/2023: Sinus bradycardia.  Rate 48.  TTE 12/18/2021: 1. Left ventricular ejection fraction, by estimation, is 60 to 65%. The  left ventricle has normal function. The left ventricle has no regional  wall motion abnormalities. There is mild concentric left ventricular  hypertrophy. Left ventricular diastolic  parameters are consistent with Grade I diastolic dysfunction (impaired  relaxation).   2. Right ventricular systolic function is normal. The right ventricular  size is normal. Tricuspid regurgitation signal is inadequate for assessing  PA pressure.   3. Left atrial size was moderately dilated.   4.  The mitral valve is grossly normal. Trivial mitral valve  regurgitation. No evidence of mitral stenosis.   5. The aortic valve is tricuspid. There is mild calcification of the  aortic valve. There is mild thickening of the aortic valve. Aortic valve  regurgitation is not visualized. Aortic valve sclerosis/calcification is  present, without any evidence of  aortic stenosis.   6. The inferior vena cava is normal in size with greater than 50%  respiratory variability, suggesting right atrial pressure of 3 mmHg.   Carotid duplex 12/06/2021: Summary:  Right Carotid: Velocities in the right ICA are consistent with a 1-39%  stenosis. Non-hemodynamically significant plaque <50% noted in the CCA.  Left Carotid: Velocities in the left ICA are consistent with a 1-39% stenosis. Non-hemodynamically significant plaque <50% noted in the CCA.  Vertebrals: Bilateral vertebral arteries demonstrate antegrade flow.  Subclavians: Normal flow hemodynamics were seen in bilateral subclavian arteries.     [name] [facility] Stay [facility]/Anesthesiology Phone [phone] 09/16/2023 8:42 AM

## 2023-09-16 NOTE — Anesthesia Preprocedure Evaluation (Signed)
 Anesthesia Evaluation  Patient identified by MRN, date of birth, ID band Patient awake    Reviewed: Allergy & Precautions, NPO status , Patient's Chart, lab work & pertinent test results  History of Anesthesia Complications Negative for: history of anesthetic complications  Airway Mallampati: II  TM Distance: >3 FB Neck ROM: Full    Dental no notable dental hx. (+) Teeth Intact, Dental Advisory Given   Pulmonary Current Smoker   Pulmonary exam normal breath sounds clear to auscultation       Cardiovascular hypertension, (-) angina (-) Past MI Normal cardiovascular exam Rhythm:Regular Rate:Normal  12/2021 TTE 1. Left ventricular ejection fraction, by estimation, is 60 to 65%. The  left ventricle has normal function. The left ventricle has no regional  wall motion abnormalities. There is mild concentric left ventricular  hypertrophy. Left ventricular diastolic  parameters are consistent with Grade I diastolic dysfunction (impaired  relaxation).   2. Right ventricular systolic function is normal. The right ventricular  size is normal. Tricuspid regurgitation signal is inadequate for assessing  PA pressure.   3. Left atrial size was moderately dilated.   4. The mitral valve is grossly normal. Trivial mitral valve  regurgitation. No evidence of mitral stenosis.   5. The aortic valve is tricuspid. There is mild calcification of the  aortic valve. There is mild thickening of the aortic valve. Aortic valve  regurgitation is not visualized. Aortic valve sclerosis/calcification is  present, without any evidence of  aortic stenosis.   6. The inferior vena cava is normal in size with greater than 50%  respiratory variability, suggesting right atrial pressure of 3 mmHg.      Neuro/Psych    GI/Hepatic ,GERD  Medicated and Controlled,,  Endo/Other    Renal/GU Renal InsufficiencyRenal diseaseLab Results      Component                 Value               Date                         K                        3.8                 08/28/2023                CO2                      25                  08/28/2023                BUN                      42 (H)              08/28/2023                CREATININE               1.49 (H)            08/28/2023                    Musculoskeletal   Abdominal   Peds  Hematology Lab Results      Component  Value               Date                      WBC                      7.5                 08/28/2023                HGB                      13.6                08/28/2023                HCT                      41.7                08/28/2023                MCV                      85.5                08/28/2023                PLT                      242                 08/28/2023              Anesthesia Other Findings All: Benazepril  Reproductive/Obstetrics                             Anesthesia Physical Anesthesia Plan  ASA: 3  Anesthesia Plan: General   Post-op Pain Management: Tylenol  PO (pre-op)*   Induction: Intravenous  PONV Risk Score and Plan: 3 and Treatment may vary due to age or medical condition, Ondansetron  and Dexamethasone   Airway Management Planned: LMA  Additional Equipment: None  Intra-op Plan:   Post-operative Plan: Extubation in OR  Informed Consent: I have reviewed the patients History and Physical, chart, labs and discussed the procedure including the risks, benefits and alternatives for the proposed anesthesia with the patient or authorized representative who has indicated his/her understanding and acceptance.     Dental advisory given  Plan Discussed with: CRNA and Surgeon  Anesthesia Plan Comments: (PAT note by Rudy Costain, PA-C: 78 year old female with pertinent history including current smoker, HTN, CKD 3, GERD on PPI, breast cancer.  Follows with oncology for history of breast cancer.   Initially had left breast cancer in 2007 treated with surgery and adjuvant radiation.  Now with newly diagnosed 2.8 cm triple negative breast cancer in left breast. No axillary involvement.  She has been recommended to undergo neoadjuvant chemotherapy followed by mastectomy.  Blood pressure noted to be uncontrolled at preadmission testing appointment.  Initially 201/64 and 208/84 on recheck.  Heart rate 48 by EKG.  Patient denied any symptoms of chest pain, palpitations, headache.  Discussed the importance of proper blood pressure management.  Patient reported taking medications as prescribed.  She was advised to reach out to her PCP for further optimization  prior to surgery.  Patient seen by PCP Dr. Oma Bias on 09/13/2023.  Per note, patient had only been taking labetalol  300 mg once daily rather than twice daily as prescribed.  She was continued on amlodipine  10 mg daily.  Hydralazine  50 mg 3 times daily was added.  Patient stated she did buy a blood pressure cuff so that she could monitor at home.  Preop labs reviewed, creatinine elevated 1.49 consistent with history of CKD, otherwise unremarkable.  EKG 09/11/2023: Sinus bradycardia.  Rate 48.  TTE 12/18/2021: 1. Left ventricular ejection fraction, by estimation, is 60 to 65%. The  left ventricle has normal function. The left ventricle has no regional  wall motion abnormalities. There is mild concentric left ventricular  hypertrophy. Left ventricular diastolic  parameters are consistent with Grade I diastolic dysfunction (impaired  relaxation).  2. Right ventricular systolic function is normal. The right ventricular  size is normal. Tricuspid regurgitation signal is inadequate for assessing  PA pressure.  3. Left atrial size was moderately dilated.  4. The mitral valve is grossly normal. Trivial mitral valve  regurgitation. No evidence of mitral stenosis.  5. The aortic valve is tricuspid. There is mild calcification of the  aortic  valve. There is mild thickening of the aortic valve. Aortic valve  regurgitation is not visualized. Aortic valve sclerosis/calcification is  present, without any evidence of  aortic stenosis.  6. The inferior vena cava is normal in size with greater than 50%  respiratory variability, suggesting right atrial pressure of 3 mmHg.   Carotid duplex 12/06/2021: Summary:  Right Carotid: Velocities in the right ICA are consistent with a 1-39%  stenosis. Non-hemodynamically significant plaque <50% noted in the CCA.  Left Carotid: Velocities in the left ICA are consistent with a 1-39% stenosis. Non-hemodynamically significant plaque <50% noted in the CCA.  Vertebrals:Bilateral vertebral arteries demonstrate antegrade flow.  Subclavians: Normal flow hemodynamics were seen in bilateral subclavian arteries.    )        Anesthesia Quick Evaluation

## 2023-09-17 ENCOUNTER — Encounter: Payer: Self-pay | Admitting: Licensed Clinical Social Worker

## 2023-09-17 NOTE — Progress Notes (Signed)
 [facility] Clinical Social Work  CSW called pt to discuss Scientist, product/process development. CSW is out of office when pt is coming in this week, so will leave documents at the front desk for pt. Pt voiced understanding and will contact CSW if she has questions while completing the applications.   [name] E Pama Roskos, LCSW

## 2023-09-18 ENCOUNTER — Ambulatory Visit (HOSPITAL_COMMUNITY)
Admission: RE | Admit: 2023-09-18 | Discharge: 2023-09-18 | Disposition: A | Discharge status: Home / Self Care | Attending: Surgery | Admitting: Surgery

## 2023-09-18 ENCOUNTER — Other Ambulatory Visit: Payer: Self-pay

## 2023-09-18 ENCOUNTER — Ambulatory Visit (HOSPITAL_COMMUNITY)

## 2023-09-18 ENCOUNTER — Ambulatory Visit

## 2023-09-18 ENCOUNTER — Ambulatory Visit (HOSPITAL_COMMUNITY): Payer: Self-pay | Admitting: Physician Assistant

## 2023-09-18 ENCOUNTER — Ambulatory Visit (HOSPITAL_BASED_OUTPATIENT_CLINIC_OR_DEPARTMENT_OTHER): Payer: Self-pay | Admitting: Physician Assistant

## 2023-09-18 ENCOUNTER — Encounter (HOSPITAL_COMMUNITY): Payer: Self-pay | Admitting: Surgery

## 2023-09-18 ENCOUNTER — Encounter (HOSPITAL_COMMUNITY)
Admission: RE | Disposition: A | Discharge status: Home / Self Care | Payer: Self-pay | Source: Home / Self Care | Attending: Surgery

## 2023-09-18 DIAGNOSIS — Z452 Encounter for adjustment and management of vascular access device: Secondary | ICD-10-CM | POA: Diagnosis not present

## 2023-09-18 DIAGNOSIS — I129 Hypertensive chronic kidney disease with stage 1 through stage 4 chronic kidney disease, or unspecified chronic kidney disease: Secondary | ICD-10-CM | POA: Diagnosis not present

## 2023-09-18 DIAGNOSIS — Z79899 Other long term (current) drug therapy: Secondary | ICD-10-CM | POA: Insufficient documentation

## 2023-09-18 DIAGNOSIS — Z17421 Hormone receptor negative with human epidermal growth factor receptor 2 negative status: Secondary | ICD-10-CM | POA: Diagnosis not present

## 2023-09-18 DIAGNOSIS — Z1721 Progesterone receptor positive status: Secondary | ICD-10-CM | POA: Diagnosis not present

## 2023-09-18 DIAGNOSIS — Z171 Estrogen receptor negative status [ER-]: Secondary | ICD-10-CM | POA: Diagnosis not present

## 2023-09-18 DIAGNOSIS — Z7982 Long term (current) use of aspirin: Secondary | ICD-10-CM | POA: Diagnosis not present

## 2023-09-18 DIAGNOSIS — C50312 Malignant neoplasm of lower-inner quadrant of left female breast: Secondary | ICD-10-CM | POA: Insufficient documentation

## 2023-09-18 DIAGNOSIS — C50912 Malignant neoplasm of unspecified site of left female breast: Secondary | ICD-10-CM

## 2023-09-18 DIAGNOSIS — Z853 Personal history of malignant neoplasm of breast: Secondary | ICD-10-CM | POA: Insufficient documentation

## 2023-09-18 DIAGNOSIS — N183 Chronic kidney disease, stage 3 unspecified: Secondary | ICD-10-CM

## 2023-09-18 DIAGNOSIS — F17210 Nicotine dependence, cigarettes, uncomplicated: Secondary | ICD-10-CM | POA: Insufficient documentation

## 2023-09-18 DIAGNOSIS — Z170 Estrogen receptor positive status [ER+]: Secondary | ICD-10-CM | POA: Diagnosis not present

## 2023-09-18 DIAGNOSIS — Z79810 Long term (current) use of selective estrogen receptor modulators (SERMs): Secondary | ICD-10-CM | POA: Insufficient documentation

## 2023-09-18 DIAGNOSIS — Z923 Personal history of irradiation: Secondary | ICD-10-CM | POA: Insufficient documentation

## 2023-09-18 DIAGNOSIS — C50919 Malignant neoplasm of unspecified site of unspecified female breast: Secondary | ICD-10-CM | POA: Diagnosis not present

## 2023-09-18 HISTORY — PX: PORTACATH PLACEMENT: SHX2246

## 2023-09-18 SURGERY — INSERTION, TUNNELED CENTRAL VENOUS DEVICE, WITH PORT
Anesthesia: General | Site: Chest

## 2023-09-18 MED ORDER — DEXAMETHASONE SODIUM PHOSPHATE 10 MG/ML IJ SOLN
INTRAMUSCULAR | Status: AC
  Filled 2023-09-18: qty 1

## 2023-09-18 MED ORDER — LIDOCAINE 2% (20 MG/ML) 5 ML SYRINGE
INTRAMUSCULAR | Status: AC
  Filled 2023-09-18: qty 5

## 2023-09-18 MED ORDER — FENTANYL CITRATE (PF) 100 MCG/2ML IJ SOLN
INTRAMUSCULAR | Status: AC
  Filled 2023-09-18: qty 2

## 2023-09-18 MED ORDER — CHLORHEXIDINE GLUCONATE 0.12 % MT SOLN
15.0000 mL | Freq: Once | OROMUCOSAL | Status: AC
Start: 2023-09-18 — End: 2023-09-18

## 2023-09-18 MED ORDER — TRAMADOL HCL 50 MG PO TABS: 50.0000 mg | ORAL_TABLET | Freq: Four times a day (QID) | ORAL | 0 refills | Status: DC | PRN

## 2023-09-18 MED ORDER — LIDOCAINE 2% (20 MG/ML) 5 ML SYRINGE
INTRAMUSCULAR | Status: DC | PRN
  Administered 2023-09-18: 60 mg via INTRAVENOUS

## 2023-09-18 MED ORDER — MIDAZOLAM HCL 2 MG/2ML IJ SOLN
INTRAMUSCULAR | Status: DC | PRN
  Administered 2023-09-18 (×2): 1 mg via INTRAVENOUS

## 2023-09-18 MED ORDER — PHENYLEPHRINE 80 MCG/ML (10ML) SYRINGE FOR IV PUSH (FOR BLOOD PRESSURE SUPPORT)
PREFILLED_SYRINGE | INTRAVENOUS | Status: AC
  Filled 2023-09-18: qty 10

## 2023-09-18 MED ORDER — PROPOFOL 10 MG/ML IV BOLUS
INTRAVENOUS | Status: AC
Start: 2023-09-18 — End: 2023-09-18
  Filled 2023-09-18: qty 20

## 2023-09-18 MED ORDER — ONDANSETRON HCL 4 MG/2ML IJ SOLN
INTRAMUSCULAR | Status: DC | PRN
  Administered 2023-09-18: 4 mg via INTRAVENOUS

## 2023-09-18 MED ORDER — CHLORHEXIDINE GLUCONATE CLOTH 2 % EX PADS: 6.0000 | MEDICATED_PAD | Freq: Once | CUTANEOUS | Status: DC

## 2023-09-18 MED ORDER — BUPIVACAINE-EPINEPHRINE (PF) 0.25% -1:200000 IJ SOLN
INTRAMUSCULAR | Status: AC
  Filled 2023-09-18: qty 30

## 2023-09-18 MED ORDER — ONDANSETRON HCL 4 MG/2ML IJ SOLN
INTRAMUSCULAR | Status: AC
  Filled 2023-09-18: qty 2

## 2023-09-18 MED ORDER — CEFAZOLIN SODIUM-DEXTROSE 2-4 GM/100ML-% IV SOLN
2.0000 g | INTRAVENOUS | Status: AC
  Administered 2023-09-18: 2 g via INTRAVENOUS
  Filled 2023-09-18: qty 100

## 2023-09-18 MED ORDER — ORAL CARE MOUTH RINSE
15.0000 mL | Freq: Once | OROMUCOSAL | Status: AC
  Administered 2023-09-18: 15 mL via OROMUCOSAL

## 2023-09-18 MED ORDER — IBUPROFEN 800 MG PO TABS: 800.0000 mg | ORAL_TABLET | Freq: Three times a day (TID) | ORAL | 0 refills | Status: DC | PRN

## 2023-09-18 MED ORDER — LACTATED RINGERS IV SOLN: INTRAVENOUS | Status: DC

## 2023-09-18 MED ORDER — FENTANYL CITRATE (PF) 250 MCG/5ML IJ SOLN
INTRAMUSCULAR | Status: DC | PRN
  Administered 2023-09-18: 50 ug via INTRAVENOUS

## 2023-09-18 MED ORDER — HEPARIN SOD (PORK) LOCK FLUSH 100 UNIT/ML IV SOLN
INTRAVENOUS | Status: DC | PRN
  Administered 2023-09-18: 500 [IU] via INTRAVENOUS

## 2023-09-18 MED ORDER — ACETAMINOPHEN 10 MG/ML IV SOLN: 1000.0000 mg | Freq: Once | INTRAVENOUS | Status: DC | PRN

## 2023-09-18 MED ORDER — ONDANSETRON HCL 4 MG/2ML IJ SOLN: 4.0000 mg | Freq: Once | INTRAMUSCULAR | Status: DC | PRN

## 2023-09-18 MED ORDER — DEXAMETHASONE SODIUM PHOSPHATE 10 MG/ML IJ SOLN
INTRAMUSCULAR | Status: DC | PRN
  Administered 2023-09-18: 5 mg via INTRAVENOUS

## 2023-09-18 MED ORDER — HEPARIN 6000 UNIT IRRIGATION SOLUTION
Status: AC
  Filled 2023-09-18: qty 500

## 2023-09-18 MED ORDER — MIDAZOLAM HCL 2 MG/2ML IJ SOLN
INTRAMUSCULAR | Status: AC
  Filled 2023-09-18: qty 2

## 2023-09-18 MED ORDER — EPHEDRINE SULFATE-NACL 50-0.9 MG/10ML-% IV SOSY
PREFILLED_SYRINGE | INTRAVENOUS | Status: DC | PRN
  Administered 2023-09-18: 5 mg via INTRAVENOUS

## 2023-09-18 MED ORDER — PROPOFOL 10 MG/ML IV BOLUS
INTRAVENOUS | Status: DC | PRN
  Administered 2023-09-18: 150 mg via INTRAVENOUS

## 2023-09-18 MED ORDER — HEPARIN SOD (PORK) LOCK FLUSH 100 UNIT/ML IV SOLN
INTRAVENOUS | Status: AC
  Filled 2023-09-18: qty 5

## 2023-09-18 MED ORDER — FENTANYL CITRATE (PF) 100 MCG/2ML IJ SOLN: 25.0000 ug | INTRAMUSCULAR | Status: DC | PRN

## 2023-09-18 MED ORDER — HEPARIN 6000 UNIT IRRIGATION SOLUTION
Status: DC | PRN
  Administered 2023-09-18: 1

## 2023-09-18 MED ORDER — BUPIVACAINE-EPINEPHRINE 0.25% -1:200000 IJ SOLN
INTRAMUSCULAR | Status: DC | PRN
  Administered 2023-09-18: 10 mL

## 2023-09-18 MED ORDER — CELECOXIB 200 MG PO CAPS
200.0000 mg | ORAL_CAPSULE | ORAL | Status: AC
  Administered 2023-09-18: 200 mg via ORAL
  Filled 2023-09-18: qty 1

## 2023-09-18 SURGICAL SUPPLY — 27 items
BAG DECANTER FOR FLEXI CONT (MISCELLANEOUS) ×1 IMPLANT
CHLORAPREP W/TINT 26 (MISCELLANEOUS) ×1 IMPLANT
COVER SURGICAL LIGHT HANDLE (MISCELLANEOUS) ×1 IMPLANT
COVER TRANSDUCER ULTRASND GEL (DISPOSABLE) ×1 IMPLANT
DRAPE C-ARM 42X120 X-RAY (DRAPES) ×1 IMPLANT
DRSG TEGADERM 4X4.75 (GAUZE/BANDAGES/DRESSINGS) IMPLANT
ELECT CAUTERY BLADE 6.4 (BLADE) ×1 IMPLANT
ELECTRODE REM PT RTRN 9FT ADLT (ELECTROSURGICAL) ×1 IMPLANT
GAUZE SPONGE 4X4 12PLY STRL (GAUZE/BANDAGES/DRESSINGS) IMPLANT
GEL ULTRASOUND 20GR AQUASONIC (MISCELLANEOUS) IMPLANT
GLOVE BIO SURGEON STRL SZ8 (GLOVE) ×1 IMPLANT
GLOVE BIOGEL PI IND STRL 8 (GLOVE) ×1 IMPLANT
GOWN STRL REUS W/ TWL LRG LVL3 (GOWN DISPOSABLE) ×1 IMPLANT
GOWN STRL REUS W/ TWL XL LVL3 (GOWN DISPOSABLE) ×1 IMPLANT
KIT BASIN OR (CUSTOM PROCEDURE TRAY) ×1 IMPLANT
KIT PORT POWER 8FR ISP CVUE (Port) IMPLANT
KIT TURNOVER KIT B (KITS) ×1 IMPLANT
NS IRRIG 1000ML POUR BTL (IV SOLUTION) ×1 IMPLANT
PAD ARMBOARD POSITIONER FOAM (MISCELLANEOUS) ×1 IMPLANT
PENCIL BUTTON HOLSTER BLD 10FT (ELECTRODE) ×1 IMPLANT
POSITIONER HEAD DONUT 9IN (MISCELLANEOUS) ×1 IMPLANT
SUT MNCRL AB 4-0 PS2 18 (SUTURE) ×1 IMPLANT
SUT PROLENE 2 0 SH 30 (SUTURE) ×1 IMPLANT
SUT VIC AB 3-0 SH 27X BRD (SUTURE) ×1 IMPLANT
SYR 5ML LUER SLIP (SYRINGE) ×1 IMPLANT
TOWEL GREEN STERILE (TOWEL DISPOSABLE) ×1 IMPLANT
TRAY LAPAROSCOPIC MC (CUSTOM PROCEDURE TRAY) ×1 IMPLANT

## 2023-09-18 NOTE — H&P (Signed)
 Chief Complaint: Breast Cancer  History of Present Illness: [name] is a 78 y.o. female who is seen today as an office consultation for evaluation of Breast Cancer  Patient presents to the [facility] for evaluation of newly diagnosed left breast cancer. She noticed a 3 cm mass left medial breast. Core biopsy showed triple negative left breast cancer grade 3 with a KI of 80%. Of notes in 2007 she had a left breast lumpectomy with sentinel lymph node mapping for stage I left breast cancer that was ER/PR positive by Dr. Linder Revere. She received postprocedure radiation therapy. She was maintained on tamoxifen for 5 years. She noticed a lump in her left breast a couple months ago. It has been sore. 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  Review of Systems: A complete review of systems was obtained from the patient. I have reviewed this information and discussed as appropriate with the patient. See HPI as well for other ROS.    Medical History: Past Medical History:  Diagnosis Date  History of cancer   There is no problem list on file for this patient.  Past Surgical History:  Procedure Laterality Date  MASTECTOMY PARTIAL / LUMPECTOMY  01/09/2016    Allergies  Allergen Reactions  Benazepril Hcl Unknown  REACTION: FACE SWELLING (ANGIOEDEMA); she can not take ARBS !!! Because of a history of documented adverse serious drug reaction;Medi Alert bracelet is recommended  Peanut Unknown  03/13/13 hivesw/o facial swelling  Shellfish Containing Products Unknown  03/15/13 hives & facial swelling  Yellow Dye Unknown  12/14 hives & facial swelling with Arlis Bent Aid   Current Outpatient Medications on File Prior to Visit  Medication Sig Dispense Refill  amLODIPine  (NORVASC ) 10 MG tablet  aspirin  81 MG chewable tablet Take 81 mg by mouth once daily  atorvastatin  (LIPITOR) 40 MG tablet Take 40 mg by mouth once daily  ergocalciferol , vitamin D2, 1,250 mcg (50,000 unit) capsule Take  50,000 Units by mouth every 7 (seven) days  gabapentin  (NEURONTIN ) 300 MG capsule Take 1 capsule by mouth at bedtime  hydroCHLOROthiazide  (HYDRODIURIL ) 25 MG tablet Take 1 tablet by mouth once daily  labetaloL  (TRANDATE ) 300 MG tablet Take 1 tablet by mouth 2 (two) times daily  omeprazole  (PRILOSEC) 40 MG DR capsule Take 1 capsule by mouth once daily   No current facility-administered medications on file prior to visit.   Family History  Problem Relation Age of Onset  Breast cancer Sister    Social History   Tobacco Use  Smoking Status Every Day  Types: Cigarettes  Smokeless Tobacco Not on file    Social History   Socioeconomic History  Marital status: Single  Tobacco Use  Smoking status: Every Day  Types: Cigarettes  Substance and Sexual Activity  Alcohol use: Not Currently  Drug use: Not Currently   Social Drivers of Health   Financial Resource Strain: Medium Risk (05/31/2023)  Received from [facility]  Overall Financial Resource Strain (CARDIA)  Difficulty of Paying Living Expenses: Somewhat hard  Food Insecurity: No Food Insecurity (05/31/2023)  Received from [facility]  Hunger Vital Sign  Worried About Running Out of Food in the Last Year: Never true  Ran Out of Food in the Last Year: Never true  Transportation Needs: No Transportation Needs (05/31/2023)  Received from [facility] - Transportation  Lack of Transportation (Medical): No  Lack of Transportation (Non-Medical): No  Physical Activity: Sufficiently Active (05/31/2023)  Received from [facility]  Exercise Vital Sign  Days of Exercise per Week: 5 days  Minutes of Exercise per Session: 60 min  Stress: No Stress Concern Present (05/31/2023)  Received from [facility] - Occupational Stress Questionnaire  Feeling of Stress : Not at all  Social Connections: Moderately Isolated (05/31/2023)  Received from [facility]  Social Connection and Isolation Panel  [NHANES]  Frequency of Communication with Friends and Family: More than three times a week  Frequency of Social Gatherings with Friends and Family: More than three times a week  Attends Religious Services: 1 to 4 times per year  Active Member of [employer] or Organizations: No  Attends Banker Meetings: Never  Marital Status: Never married  Housing Stability: Unknown (05/31/2023)  Received from [facility]  Housing Stability Vital Sign  Unable to Pay for Housing in the Last Year: No  Homeless in the Last Year: No   Objective:  There were no vitals filed for this visit.  There is no height or weight on file to calculate BMI.  Physical Exam Exam conducted with a chaperone present.  HENT:  Head: Normocephalic.  Eyes:  Pupils: Pupils are equal, round, and reactive to light.  Chest:  Breasts: Right: Normal.  Left: Mass, skin change and tenderness present.   Comments: Mobile 3 cm mass left inner lower quadrant. Scar noted radiation changes noted. Left breast smaller than right breast. Significant ptosis right breast noted. Musculoskeletal:  General: Normal range of motion.  Lymphadenopathy:  Upper Body:  Right upper body: No supraclavicular or axillary adenopathy.  Left upper body: No supraclavicular or axillary adenopathy.  Skin: General: Skin is warm.  Neurological:  General: No focal deficit present.  Mental Status: She is alert.  Psychiatric:  Mood and Affect: Mood normal.     Labs, Imaging and Diagnostic Testing:  FINAL DIAGNOSIS   1. Breast, left, needle core biopsy, 8:00 8 cmfn :  INVASIVE DUCTAL CARCINOMA, WITH EXTENSIVE NECROSIS  TUBULE FORMATION: SCORE 3  NUCLEAR PLEOMORPHISM: SCORE 3  MITOTIC COUNT: SCORE 3  TOTAL SCORE: 9  OVERALL GRADE: 3  LYMPHOVASCULAR INVASION: NOT IDENTIFIED  CANCER LENGTH: 1.4 CM / 14 MM  CALCIFICATIONS: NOT IDENTIFIED  OTHER FINDINGS: NONE  SEE NOTE   Diagnosis Note : Dr. Brunetta Capes reviewed the case and concurs with the   interpretation. A breast prognostic profile (ER and PR) is pending and will be  reported in an addendum. [facility] was notified on  08/21/2023.   DATE SIGNED OUT: 08/21/2023  ELECTRONIC SIGNATURE : Dillard Frame Md, Pathologist, Electronic Signature   MICROSCOPIC DESCRIPTION   CASE COMMENTS  STAINS USED IN DIAGNOSIS:  H&E-2  H&E-3  H&E-4  H&E  *RECUT 1 SLIDE  Stains used in diagnosis 1 Her2 by IHC, 1 ER-ACIS, 1 KI-67-ACIS, 1 PR-ACIS  IHC scores are reported using ASCO/CAP scoring criteria. An IHC Score of 0 or  1+ is NEGATIVE for HER2, 3+ is POSITIVE for HER2, and 2+ is EQUIVOCAL.  Equivocal results are reflexed to either FISH or IHC testing. Specimens are  fixed in 10% Neutral Buffered Formalin for at least 6 hours and up to 72 hours.  These tests have not be validated on decalcified tissue. Results should be  interpreted with caution given the possibility of false negative results on  decalcified specimens. Antibody Clone for HER2 is 4B5 (PATHWAY). Some of these  immunohistochemical stains may have been developed and the performance  characteristics determined by  [facility]. Some may not have been  cleared or approved by the U.S. Food and Drug Administration. The FDA has  determined that such clearance or approval is not necessary. This test is used  for clinical purposes. It should not be regarded as investigational or for  research. This laboratory is certified under the Clinical Laboratory  Improvement Amendments of 1988 (CLIA-88) as qualified to perform high complexity  clinical laboratory testing.  Estrogen receptor (6F11), immunohistochemical stains are performed on formalin  fixed, paraffin embedded tissue using a 3,3-diaminobenzidine (DAB) chromogen  and Leica Bond Autostainer System. The staining intensity of the nucleus is  scored manually and is reported as the percentage of tumor cell nuclei  demonstrating specific nuclear  staining.Specimens are fixed in 10% Neutral  Buffered Formalin for at least 6 hours and up to 72 hours. These tests have not  be validated on decalcified tissue. Results should be interpreted with caution  given the possibility of false negative results on decalcified specimens.  Ki-67 (MM1), immunohistochemical stains are performed on formalin fixed,  paraffin embedded tissue using a 3,3-diaminobenzidine (DAB) chromogen and Leica  Bond Autostainer System. The staining intensity of the nucleus is scored  manually and is reported as the percentage of tumor cell nuclei demonstrating  specific nuclear staining.Specimens are fixed in 10% Neutral Buffered Formalin  for at least 6 hours and up to 72 hours. These tests have not be validated on  decalcified tissue. Results should be interpreted with caution given the  possibility of false negative results on decalcified specimens.  PR progesterone receptor (16), immunohistochemical stains are performed on  formalin fixed, paraffin embedded tissue using a 3,3-diaminobenzidine (DAB)  chromogen and Leica Bond Autostainer System. The staining intensity of the  nucleus is scored manually and is reported as the percentage of tumor cell  nuclei demonstrating specific nuclear staining.Specimens are fixed in 10%  Neutral Buffered Formalin for at least 6 hours and up to 72 hours. These tests  have not be validated on decalcified tissue. Results should be interpreted with  caution given the possibility of false negative results on decalcified  specimens.   ADDENDUM  Breast, left, needle core biopsy, 8:00 8 cmfn  PROGNOSTIC INDICATORS   Results:  IMMUNOHISTOCHEMICAL AND MORPHOMETRIC ANALYSIS PERFORMED MANUALLY  The tumor cells are NEGATIVE for Her2 (0).  Estrogen Receptor: 0%, NEGATIVE  Progesterone Receptor: 0%, NEGATIVE  Proliferation Marker Ki67: 80%  COMMENT: The negative hormone receptor study(ies) in this case has an internal positive control.    REFERENCE RANGE ESTROGEN RECEPTOR  NEGATIVE 0%  POSITIVE =>1%  REFERENCE RANGE PROGESTERONE RECEPTOR  NEGATIVE 0%  POSITIVE =>1%  All controls stained appropriately  Earleen Glazier, John, Pathologist, Electronic Signature  ( Signed [phone] 2025)   CLINICAL DATA: 78 year old female presenting for delayed follow-up  of a probably benign right breast mass. Patient has a history of  left breast cancer status post lumpectomy in 2007.   EXAM:  DIGITAL DIAGNOSTIC BILATERAL MAMMOGRAM WITH TOMOSYNTHESIS AND CAD;  ULTRASOUND LEFT BREAST LIMITED; ULTRASOUND RIGHT BREAST LIMITED   TECHNIQUE:  Bilateral digital diagnostic mammography and breast tomosynthesis  was performed. The images were evaluated with computer-aided  detection. ; Targeted ultrasound examination of the left breast was  performed.; Targeted ultrasound examination of the right breast was  performed   COMPARISON: Previous exam(s).   ACR Breast Density Category b: There are scattered areas of  fibroglandular density.   FINDINGS:  Mammogram:   Right breast: The previously seen small  mass in the upper  superficial right breast is no longer visualized. No suspicious  mass, distortion, or microcalcifications are identified to suggest  presence of malignancy.   Left breast: Spot 2D magnification views of the left breast were  performed in addition to standard views. There is a new irregular  suspicious mass in the lower inner left breast measuring 3.1 cm.  There are associated suspicious pleomorphic calcifications. The  calcifications extend slightly outside of the mass at the superior,  anterior and inferior aspect by 0.5 cm or less. There are benign  postsurgical changes in the upper central left breast.   On physical exam of the lower inner left breast I feel a discrete  mass.   Ultrasound:   Right breast: Targeted ultrasound performed in the right breast at  12 o'clock 6 cm from the nipple demonstrating resolution  of the mass  previously seen in this location.   Ultrasound:   Targeted ultrasound performed in the left breast at 8 o'clock 8 cm  from the nipple demonstrating an irregular hypoechoic mass with  associated calcifications overall measuring 2.3 x 2.0 x 2.8 cm.  Targeted ultrasound of the left axilla demonstrates normal lymph  nodes.   IMPRESSION:  1. Highly suspicious mass with associated calcifications in the left  breast at 8 o'clock measuring 2.8 cm. The calcifications extend  slightly outside of the mass by 0.5 cm or less.  2. Resolution of the previously seen mass in the right breast at 12  o'clock. No mammographic evidence of malignancy in the right breast.   RECOMMENDATION:  Ultrasound-guided core needle biopsy x1 of the left breast.   I have discussed the findings and recommendations with the patient.  If applicable, a reminder letter will be sent to the patient  regarding the next appointment.   BI-RADS CATEGORY 5: Highly suggestive of malignancy.   Electronically Signed  By: Allena Ito M.D.  On: 08/15/2023 15:17   Assessment and Plan:   Diagnoses and all orders for this visit:  Breast cancer, stage 2, left (CMS/HHS-HCC)   Triple negative stage II left breast cancer  Previous history of stage I left breast cancer treated with breast conserving surgery, sentinel lymph node mapping and radiation therapy and tamoxifen  Recommend neoadjuvant chemotherapy and she will think about this. She will be seen by medical oncology to deem fitness for this treatment. Given her previous history 1 option is left simple mastectomy with or without reconstruction. We certainly could reattempt sentinel lymph node mapping as well. If she goes that route, she would like to have bilateral mastectomies and consideration of reconstruction. If that is the case, she will need to see plastic surgery.  Reviewed the literature on repeat lumpectomy after radiation therapy. This is an  aggressive cancer may not be your best choice.  When she makes a decision we can certainly help her progress down the path.  I did review port placement with her if she needs one.  She may not need a port therefore we will keep this on hold for now.   Sharlee Dawes, MD

## 2023-09-18 NOTE — Progress Notes (Deleted)
 [facility] Cancer Follow up:    Kimberly Dauphin, MD [address] [place] [address]   DIAGNOSIS: Cancer Staging  Malignant neoplasm of lower-inner quadrant of left breast in female, estrogen receptor negative (HCC) Staging form: Breast, AJCC 8th Edition - Clinical stage from 08/28/2023: Stage IIB (cT2, cN0, cM0, G3, ER-, PR-, HER2-) - Signed by Murleen Arms, MD on 08/28/2023 Stage prefix: Initial diagnosis Histologic grading system: 3 grade system Laterality: Left Staged by: Pathologist and managing physician Stage used in treatment planning: Yes National guidelines used in treatment planning: Yes Type of national guideline used in treatment planning: NCCN    SUMMARY OF ONCOLOGIC HISTORY: Oncology History  Malignant neoplasm of lower-inner quadrant of left breast in female, estrogen receptor negative (HCC)  08/15/2023 Mammogram   Highly suspicious mass with associated calcifications in the left breast at 8 o'clock measuring 2.8 cm. The calcifications extend slightly outside of the mass by 0.5 cm or less. Resolution of the previously seen mass in the right breast at 12o'clock. No mammographic evidence of malignancy in the right breast.     08/20/2023 Pathology Results   Left breast needle core biopsy showed grade 3 IDC, triple negative.   08/26/2023 Initial Diagnosis   Malignant neoplasm of lower-inner quadrant of left breast in female, estrogen receptor negative (HCC)   08/28/2023 Cancer Staging   Staging form: Breast, AJCC 8th Edition - Clinical stage from 08/28/2023: Stage IIB (cT2, cN0, cM0, G3, ER-, PR-, HER2-) - Signed by Murleen Arms, MD on 08/28/2023 Stage prefix: Initial diagnosis Histologic grading system: 3 grade system Laterality: Left Staged by: Pathologist and managing physician Stage used in treatment planning: Yes National guidelines used in treatment planning: Yes Type of national guideline used in treatment planning: NCCN   09/19/2023 -   Chemotherapy   Patient is on Treatment Plan : BREAST TC q21d      Genetic Testing   [facility] CancerNext Panel+RNA was Negative. Report date is 09/08/2023.   The [facility] CancerNext+RNAinsight Panel includes sequencing, rearrangement analysis, and RNA analysis for the following 40 genes: APC, ATM, BAP1, BARD1, BMPR1A, BRCA1, BRCA2, BRIP1, CDH1, CDKN2A, CHEK2, FH, FLCN, MET, MLH1, MSH2, MSH6, MUTYH, NF1, NTHL1, PALB2, PMS2, PTEN, RAD51C, RAD51D, RPS20, SMAD4, STK11, TP53, TSC1, TSC2, and VHL (sequencing and deletion/duplication); AXIN2, HOXB13, MBD4, MSH3, POLD1 and POLE (sequencing only); EPCAM and GREM1 (deletion/duplication only).      CURRENT THERAPY: Taxotere/Cytoxan  INTERVAL HISTORY:  Discussed the use of AI scribe software for clinical note transcription with the patient, who gave verbal consent to proceed.  History of Present Illness      Patient Active Problem List   Diagnosis Date Noted   Genetic testing 09/11/2023   Malignant neoplasm of lower-inner quadrant of left breast in female, estrogen receptor negative (HCC) 08/26/2023   CKD (chronic kidney disease) stage 3, GFR 30-59 ml/min (HCC) 06/16/2023   Muscle cramping 07/09/2022   Weight loss 05/04/2022   Subacute cough 05/04/2022   Breast lump on right side at 11 o'clock position 12/05/2021   Vitamin D  deficiency 12/04/2021   Low vitamin B12 level 12/04/2021   Clavicle enlargement 07/23/2018   Osteopenia 06/14/2018   Achilles tendinitis 01/29/2017   Bilateral carotid artery occlusion 01/29/2017   Neuropathy at site of breast surgery 01/28/2017   Prediabetes 07/24/2015   GERD (gastroesophageal reflux disease) 07/20/2015   Vitiligo 07/07/2012   PLANTAR FASCIITIS 06/16/2010   Tobacco dependence due to cigarettes 06/14/2009   Hyperlipidemia 11/10/2008   Angioedema 01/09/2008  Hypertension 06/12/2007   Personal history of malignant neoplasm of breast 02/25/2007   RHEUMATIC FEVER, HX OF 02/21/2007    is allergic to  benazepril hcl and yellow dyes (non-tartrazine).  MEDICAL HISTORY: Past Medical History:  Diagnosis Date   Absence of menstruation    amenorrhea   Angioneurotic edema not elsewhere classified    due to ACE-I   Breast cancer (HCC)    Breast disorder    breast cancer 2007   Cancer [facility]) 2007   Left Breast Cancer   Carotid stenosis    Carotid US  (8/15):  RICA 40-59%; LICA 40-59% >>> FU 1 year   Chronic kidney disease    CKD3   GERD (gastroesophageal reflux disease)    H/O: rheumatic fever    History of breast cancer 07/13/2015   HLD (hyperlipidemia)    Occlusion and stenosis of carotid artery without mention of cerebral infarction    Other abnormal glucose    fasting hyperglycemia   Peripheral vascular disease (HCC)    Carotid Stenosis   Personal history of malignant neoplasm of breast    Personal history of radiation therapy 2007   Pneumonia    Pre-diabetes    Unspecified essential hypertension     SURGICAL HISTORY: Past Surgical History:  Procedure Laterality Date   APPENDECTOMY  1972   In [place]    BREAST BIOPSY Left 12/06/2005   malignant   BREAST BIOPSY Left 08/20/2023   US  LT BREAST BX W LOC DEV 1ST LESION IMG BX SPEC US  GUIDE 08/20/2023 [facility] MAMMOGRAPHY   BREAST LUMPECTOMY Left 01/08/2006   COLONOSCOPY  2005   negative; Dr Tova Fresh   COLONOSCOPY N/A 09/02/2015   Procedure: COLONOSCOPY;  Surgeon: Alyce Jubilee, MD;  Location: [facility];  Service: Endoscopy;  Laterality: N/A;  1215-moved to 1130 Ginger to notify pt    SOCIAL HISTORY: Social History   Socioeconomic History   Marital status: Single    Spouse name: Not on file   Number of children: Not on file   Years of education: Not on file   Highest education level: Not on file  Occupational History   Not on file  Tobacco Use   Smoking status: Every Day    Current packs/day: 0.50    Average packs/day: 0.5 packs/day for 48.0 years (24.0 ttl pk-yrs)    Types: Cigarettes    Passive exposure:  Current   Smokeless tobacco: Never   Tobacco comments:    Smokes daily.    Vaping Use   Vaping status: Never Used  Substance and Sexual Activity   Alcohol use: Yes    Alcohol/week: 1.0 standard drink of alcohol    Types: 1 Standard drinks or equivalent per week    Comment: occasionally   Drug use: No   Sexual activity: Yes    Birth control/protection: Post-menopausal  Other Topics Concern   Not on file  Social History Narrative   Single, no children.   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    Social Drivers of Health   Financial Resource Strain: Medium Risk (05/31/2023)   Overall Financial Resource Strain (CARDIA)    Difficulty of Paying Living Expenses: Somewhat hard  Food Insecurity: No Food Insecurity (08/28/2023)   Hunger Vital Sign    Worried About Running Out of Food in the Last Year: Never true    Ran Out of Food in the Last Year: Never true  Transportation Needs: No Transportation Needs (08/28/2023)   PRAPARE - Transportation  Lack of Transportation (Medical): No    Lack of Transportation (Non-Medical): No  Physical Activity: Sufficiently Active (05/31/2023)   Exercise Vital Sign    Days of Exercise per Week: 5 days    Minutes of Exercise per Session: 60 min  Stress: No Stress Concern Present (05/31/2023)   [employer] of Occupational Health - Occupational Stress Questionnaire    Feeling of Stress : Not at all  Social Connections: Moderately Isolated (05/31/2023)   Social Connection and Isolation Panel [NHANES]    Frequency of Communication with Friends and Family: More than three times a week    Frequency of Social Gatherings with Friends and Family: More than three times a week    Attends Religious Services: 1 to 4 times per year    Active Member of [employer] or Organizations: No    Attends Banker Meetings: Never    Marital Status: Never married  Intimate Partner Violence: Not At Risk (08/28/2023)   Humiliation, Afraid, Rape, and Kick  questionnaire    Fear of Current or Ex-Partner: No    Emotionally Abused: No    Physically Abused: No    Sexually Abused: No    FAMILY HISTORY: Family History  Problem Relation Age of Onset   Breast cancer Sister 96   Hypertension Other        some of Pts brothers and sisters   Heart attack Neg Hx    Stroke Neg Hx    Diabetes Neg Hx    Heart disease Neg Hx     Review of Systems  Constitutional:  Negative for appetite change, chills, fatigue, fever and unexpected weight change.  HENT:   Negative for hearing loss, lump/mass and trouble swallowing.   Eyes:  Negative for eye problems and icterus.  Respiratory:  Negative for chest tightness, cough and shortness of breath.   Cardiovascular:  Negative for chest pain, leg swelling and palpitations.  Gastrointestinal:  Negative for abdominal distention, abdominal pain, constipation, diarrhea, nausea and vomiting.  Endocrine: Negative for hot flashes.  Genitourinary:  Negative for difficulty urinating.   Musculoskeletal:  Negative for arthralgias.  Skin:  Negative for itching and rash.  Neurological:  Negative for dizziness, extremity weakness, headaches and numbness.  Hematological:  Negative for adenopathy. Does not bruise/bleed easily.  Psychiatric/Behavioral:  Negative for depression. The patient is not nervous/anxious.       PHYSICAL EXAMINATION    There were no vitals filed for this visit.  Physical Exam Constitutional:      General: She is not in acute distress.    Appearance: Normal appearance. She is not toxic-appearing.  HENT:     Head: Normocephalic and atraumatic.     Mouth/Throat:     Mouth: Mucous membranes are moist.     Pharynx: Oropharynx is clear. No oropharyngeal exudate or posterior oropharyngeal erythema.  Eyes:     General: No scleral icterus. Cardiovascular:     Rate and Rhythm: Normal rate and regular rhythm.     Pulses: Normal pulses.     Heart sounds: Normal heart sounds.  Pulmonary:      Effort: Pulmonary effort is normal.     Breath sounds: Normal breath sounds.  Abdominal:     General: Abdomen is flat. Bowel sounds are normal. There is no distension.     Palpations: Abdomen is soft.     Tenderness: There is no abdominal tenderness.  Musculoskeletal:        General: No swelling.     Cervical  back: Neck supple.  Lymphadenopathy:     Cervical: No cervical adenopathy.  Skin:    General: Skin is warm and dry.     Findings: No rash.  Neurological:     General: No focal deficit present.     Mental Status: She is alert.  Psychiatric:        Mood and Affect: Mood normal.        Behavior: Behavior normal.     LABORATORY DATA:  CBC    Component Value Date/Time   WBC 7.5 08/28/2023 1201   WBC 9.4 05/04/2022 1355   RBC 4.88 08/28/2023 1201   HGB 13.6 08/28/2023 1201   HGB 13.3 06/27/2020 1354   HGB 12.4 05/07/2011 1356   HCT 41.7 08/28/2023 1201   HCT 40.3 06/27/2020 1354   HCT 37.8 05/07/2011 1356   PLT 242 08/28/2023 1201   PLT 287 06/27/2020 1354   MCV 85.5 08/28/2023 1201   MCV 85 06/27/2020 1354   MCV 84.3 05/07/2011 1356   MCH 27.9 08/28/2023 1201   MCHC 32.6 08/28/2023 1201   RDW 13.9 08/28/2023 1201   RDW 13.1 06/27/2020 1354   RDW 12.9 05/07/2011 1356   LYMPHSABS 2.7 08/28/2023 1201   LYMPHSABS 4.4 (H) 06/27/2020 1354   LYMPHSABS 5.1 (H) 05/07/2011 1356   MONOABS 0.3 08/28/2023 1201   MONOABS 0.5 05/07/2011 1356   EOSABS 0.1 08/28/2023 1201   EOSABS 0.1 06/27/2020 1354   BASOSABS 0.0 08/28/2023 1201   BASOSABS 0.0 06/27/2020 1354   BASOSABS 0.1 05/07/2011 1356    CMP     Component Value Date/Time   NA 139 08/28/2023 1201   NA 136 06/27/2020 1354   K 3.8 08/28/2023 1201   CL 103 08/28/2023 1201   CO2 25 08/28/2023 1201   GLUCOSE 107 (H) 08/28/2023 1201   BUN 42 (H) 08/28/2023 1201   BUN 15 06/27/2020 1354   CREATININE 1.49 (H) 08/28/2023 1201   CALCIUM  9.9 08/28/2023 1201   PROT 8.2 (H) 08/28/2023 1201   PROT 7.5 01/31/2021 1027    ALBUMIN 4.5 08/28/2023 1201   ALBUMIN 4.3 01/31/2021 1027   AST 12 (L) 08/28/2023 1201   ALT 7 08/28/2023 1201   ALKPHOS 66 08/28/2023 1201   BILITOT 0.7 08/28/2023 1201   GFRNONAA 36 (L) 08/28/2023 1201     ASSESSMENT and THERAPY PLAN:   No problem-specific Assessment & Plan notes found for this encounter.     All questions were answered. The patient knows to call the clinic with any problems, questions or concerns. We can certainly see the patient much sooner if necessary.  Total encounter time:*** minutes*in face-to-face visit time, chart review, lab review, care coordination, order entry, and documentation of the encounter time.    Alwin Baars, NP 09/18/23 1:17 PM Medical Oncology and Hematology [facility] [address] [place], [place] [address] Tel. [phone]    Fax. [phone]  *Total Encounter Time as defined by the Centers for Medicare and Medicaid Services includes, in addition to the face-to-face time of a patient visit (documented in the note above) non-face-to-face time: obtaining and reviewing outside history, ordering and reviewing medications, tests or procedures, care coordination (communications with other health care professionals or caregivers) and documentation in the medical record.

## 2023-09-18 NOTE — Op Note (Signed)
 Preoperative diagnosis: PAC needed for chemotherapy   Postoperative diagnosis: Same  Procedure: Portacath Placement with U/S and  C arm guidance   Surgeon: Rodrigo Clara, MD, FACS  Anesthesia: General and 0.25 % marcaine with epinephrine   Clinical History and Indications: The patient is getting ready to begin chemotherapy for her cancer. She  needs a Port-A-Cath for venous access. Risk of bleeding, infection,  Collapse lung,  Death,  DVT,  Organ injury,  Mediastinal injury,  Injury to heart,  Injury to blood vessels,  Nerves,  Migration of catheter,  Embolization of catheter and the need for more surgery.  Description of Procedure: I have seen the patient in the holding area and confirmed the plans for the procedure as noted above. I reviewed the risks and complications again and the patient has no further questions. She wishes to proceed.   The patient was then taken to the operating room. After satisfactory general  anesthesia had been obtained the upper chest and lower neck were prepped and draped as a sterile field. The timeout was done.  The right internal jugular vein  was entered under U/S guidance  and the guidewire threaded into the superior vena cava right atrial area under fluoroscopic guidance. An incision was then made on the anterior chest wall and a subcutaneous pocket fashioned for the port reservoir.  The port tubing was then brought through a subcutaneous tunnel from the port site to the guidewire site.  The port and catheter were attached, locked  and flushed. The catheter was measured and cut to appropriate length.The dilator and peel-away sheath were then advanced over the guidewire while monitoring this with fluoroscopy. The guidewire and dilator were removed and the tubing threaded to approximately 22 cm. The peel-away sheath was then removed. The catheter aspirated and flushed easily. Using fluoroscopy the tip was in the superior vena cava right atrial junction area. It  aspirated and flushed easily. That aspirated and flushed easily.  The reservoir was secured to the fascia with 1 sutures of 2-0 Prolene. A final check with fluoroscopy was done to make sure we had no kinks and good positioning of the tip of the catheter. Everything appeared to be okay. The catheter was aspirated, flushed with dilute heparin and then concentrated aqueous heparin.  The incision was then closed with interrupted 3-0 Vicryl, and 4-0 Monocryl subcuticular with Dermabond on the skin.   Port was left accessed.  C-arm images obtained showed the tip to be in the superior vena cava/cavoatrial junction and in good position.  There is no pneumothorax or other complicating feature.  The catheter is ready for use.   There were no operative complications. Estimated blood loss was minimal. All counts were correct. The patient tolerated the procedure well.  Rodrigo Clara, MD, FACS

## 2023-09-18 NOTE — Transfer of Care (Signed)
 Immediate Anesthesia Transfer of Care Note  Patient: [name]  Procedure(s) Performed: INSERTION, TUNNELED CENTRAL VENOUS DEVICE, WITH PORT  Patient Location: PACU  Anesthesia Type:General  Level of Consciousness: awake  Airway & Oxygen Therapy: Patient Spontanous Breathing  Post-op Assessment: Report given to RN and Post -op Vital signs reviewed and stable  Post vital signs: Reviewed and stable  Last Vitals:  Vitals Value Taken Time  BP 170/55 09/18/23 1615  Temp    Pulse 68 09/18/23 1619  Resp 17 09/18/23 1619  SpO2 97 % 09/18/23 1619  Vitals shown include unfiled device data.  Last Pain:  Vitals:   09/18/23 1327  TempSrc:   PainSc: 0-No pain         Complications: No notable events documented.

## 2023-09-18 NOTE — Progress Notes (Signed)
 Pt's BP 191/57 and 194/61, per pt she took hydrALAZINE  and Labetalol  this morning. DR. Lasalle Pointer is aware.

## 2023-09-18 NOTE — Interval H&P Note (Signed)
 History and Physical Interval Note:  09/18/2023 1:04 PM  [name]  has presented today for surgery, with the diagnosis of BREAST CANCER.  The various methods of treatment have been discussed with the patient and family. After consideration of risks, benefits and other options for treatment, the patient has consented to  Procedure(s): INSERTION, TUNNELED CENTRAL VENOUS DEVICE, WITH PORT (N/A) as a surgical intervention.  The patient's history has been reviewed, patient examined, no change in status, stable for surgery.  I have reviewed the patient's chart and labs.  Questions were answered to the patient's satisfaction.     Maat Kafer A Delories Mauri

## 2023-09-18 NOTE — Anesthesia Procedure Notes (Signed)
 Procedure Name: LMA Insertion Date/Time: 09/18/2023 3:29 PM  Performed by: Bennett Brass, CRNAPre-anesthesia Checklist: Patient identified, Emergency Drugs available, Suction available, Patient being monitored and Timeout performed Patient Re-evaluated:Patient Re-evaluated prior to induction Oxygen Delivery Method: Circle system utilized Preoxygenation: Pre-oxygenation with 100% oxygen Induction Type: IV induction LMA Size: 4.0 Laryngoscope Size: 4 Number of attempts: 1

## 2023-09-18 NOTE — Discharge Instructions (Signed)
PORT-A-CATH: POST OP INSTRUCTIONS  Always review your discharge instruction sheet given to you by the facility where your surgery was performed.   A prescription for pain medication may be given to you upon discharge. Take your pain medication as prescribed, if needed. If narcotic pain medicine is not needed, then you make take acetaminophen (Tylenol) or ibuprofen (Advil) as needed.  Take your usually prescribed medications unless otherwise directed. If you need a refill on your pain medication, please contact our office. All narcotic pain medicine now requires a paper prescription.  Phoned in and fax refills are no longer allowed by law.  Prescriptions will not be filled after 5 pm or on weekends.  You should follow a light diet for the remainder of the day after your procedure. Most patients will experience some mild swelling and/or bruising in the area of the incision. It may take several days to resolve. It is common to experience some constipation if taking pain medication after surgery. Increasing fluid intake and taking a stool softener (such as Colace) will usually help or prevent this problem from occurring. A mild laxative (Milk of Magnesia or Miralax) should be taken according to package directions if there are no bowel movements after 48 hours.  Unless discharge instructions indicate otherwise, you may remove your bandages 48 hours after surgery, and you may shower at that time. You may have steri-strips (small white skin tapes) in place directly over the incision.  These strips should be left on the skin for 7-10 days.  If your surgeon used Dermabond (skin glue) on the incision, you may shower in 24 hours.  The glue will flake off over the next 2-3 weeks.  If your port is left accessed at the end of surgery (needle left in port), the dressing cannot get wet and should only by changed by a healthcare professional. When the port is no longer accessed (when the needle has been removed), follow  step 7.   ACTIVITIES:  Limit activity involving your arms for the next 72 hours. Do no strenuous exercise or activity for 1 week. You may drive when you are no longer taking prescription pain medication, you can comfortably wear a seatbelt, and you can maneuver your car. 10.You may need to see your doctor in the office for a follow-up appointment.  Please       check with your doctor.  11.When you receive a new Port-a-Cath, you will get a product guide and        ID card.  Please keep them in case you need them.  WHEN TO CALL YOUR DOCTOR ([phone]): Fever over 101.0 Chills Continued bleeding from incision Increased redness and tenderness at the site Shortness of breath, difficulty breathing   The clinic staff is available to answer your questions during regular business hours. Please don't hesitate to call and ask to speak to one of the nurses or medical assistants for clinical concerns. If you have a medical emergency, go to the nearest emergency room or call 911.  A surgeon from [facility] is always on call at the hospital.     For further information, please visit [email or web address]      

## 2023-09-19 ENCOUNTER — Inpatient Hospital Stay (HOSPITAL_BASED_OUTPATIENT_CLINIC_OR_DEPARTMENT_OTHER): Admitting: Adult Health

## 2023-09-19 ENCOUNTER — Inpatient Hospital Stay

## 2023-09-19 ENCOUNTER — Inpatient Hospital Stay: Admitting: Adult Health

## 2023-09-19 ENCOUNTER — Encounter: Payer: Self-pay | Admitting: Hematology and Oncology

## 2023-09-19 ENCOUNTER — Encounter (HOSPITAL_COMMUNITY): Payer: Self-pay | Admitting: Surgery

## 2023-09-19 VITALS — BP 190/64 | HR 51 | Temp 99.0°F | Resp 18 | Ht 64.0 in | Wt 162.9 lb

## 2023-09-19 VITALS — BP 172/53 | HR 50 | Temp 98.0°F | Resp 20

## 2023-09-19 DIAGNOSIS — Z171 Estrogen receptor negative status [ER-]: Secondary | ICD-10-CM

## 2023-09-19 DIAGNOSIS — Z5189 Encounter for other specified aftercare: Secondary | ICD-10-CM | POA: Diagnosis not present

## 2023-09-19 DIAGNOSIS — F17210 Nicotine dependence, cigarettes, uncomplicated: Secondary | ICD-10-CM | POA: Diagnosis not present

## 2023-09-19 DIAGNOSIS — Z5111 Encounter for antineoplastic chemotherapy: Secondary | ICD-10-CM | POA: Diagnosis not present

## 2023-09-19 DIAGNOSIS — Z17421 Hormone receptor negative with human epidermal growth factor receptor 2 negative status: Secondary | ICD-10-CM | POA: Diagnosis not present

## 2023-09-19 DIAGNOSIS — C50312 Malignant neoplasm of lower-inner quadrant of left female breast: Secondary | ICD-10-CM

## 2023-09-19 LAB — CMP (CANCER CENTER ONLY)
ALT: 8 U/L (ref 0–44)
AST: 15 U/L (ref 15–41)
Albumin: 3.8 g/dL (ref 3.5–5.0)
Alkaline Phosphatase: 58 U/L (ref 38–126)
Anion gap: 12 (ref 5–15)
BUN: 24 mg/dL — ABNORMAL HIGH (ref 8–23)
CO2: 20 mmol/L — ABNORMAL LOW (ref 22–32)
Calcium: 9.1 mg/dL (ref 8.9–10.3)
Chloride: 107 mmol/L (ref 98–111)
Creatinine: 1.51 mg/dL — ABNORMAL HIGH (ref 0.44–1.00)
GFR, Estimated: 36 mL/min — ABNORMAL LOW (ref >60)
Glucose, Bld: 97 mg/dL (ref 70–99)
Potassium: 4 mmol/L (ref 3.5–5.1)
Sodium: 139 mmol/L (ref 135–145)
Total Bilirubin: 0.6 mg/dL (ref 0.0–1.2)
Total Protein: 7.2 g/dL (ref 6.5–8.1)

## 2023-09-19 LAB — CBC WITH DIFFERENTIAL (CANCER CENTER ONLY)
Abs Immature Granulocytes: 0.01 10*3/uL (ref 0.00–0.07)
Basophils Absolute: 0 10*3/uL (ref 0.0–0.1)
Basophils Relative: 0 %
Eosinophils Absolute: 0.1 10*3/uL (ref 0.0–0.5)
Eosinophils Relative: 2 %
HCT: 35.1 % — ABNORMAL LOW (ref 36.0–46.0)
Hemoglobin: 11.5 g/dL — ABNORMAL LOW (ref 12.0–15.0)
Immature Granulocytes: 0 %
Lymphocytes Relative: 38 %
Lymphs Abs: 2.7 10*3/uL (ref 0.7–4.0)
MCH: 27.8 pg (ref 26.0–34.0)
MCHC: 32.8 g/dL (ref 30.0–36.0)
MCV: 85 fL (ref 80.0–100.0)
Monocytes Absolute: 0.4 10*3/uL (ref 0.1–1.0)
Monocytes Relative: 5 %
Neutro Abs: 3.8 10*3/uL (ref 1.7–7.7)
Neutrophils Relative %: 55 %
Platelet Count: 191 10*3/uL (ref 150–400)
RBC: 4.13 MIL/uL (ref 3.87–5.11)
RDW: 13.5 % (ref 11.5–15.5)
WBC Count: 6.9 10*3/uL (ref 4.0–10.5)
nRBC: 0 % (ref 0.0–0.2)

## 2023-09-19 MED ORDER — PALONOSETRON HCL INJECTION 0.25 MG/5ML
0.2500 mg | Freq: Once | INTRAVENOUS | Status: AC
  Administered 2023-09-19: 0.25 mg via INTRAVENOUS
  Filled 2023-09-19: qty 5

## 2023-09-19 MED ORDER — SODIUM CHLORIDE 0.9 % IV SOLN
600.0000 mg/m2 | Freq: Once | INTRAVENOUS | Status: AC
  Administered 2023-09-19: 1000 mg via INTRAVENOUS
  Filled 2023-09-19: qty 50

## 2023-09-19 MED ORDER — SODIUM CHLORIDE 0.9% FLUSH: 10.0000 mL | INTRAVENOUS | Status: DC | PRN

## 2023-09-19 MED ORDER — SODIUM CHLORIDE 0.9 % IV SOLN: INTRAVENOUS | Status: DC

## 2023-09-19 MED ORDER — SODIUM CHLORIDE 0.9 % IV SOLN
75.0000 mg/m2 | Freq: Once | INTRAVENOUS | Status: AC
  Administered 2023-09-19: 137 mg via INTRAVENOUS
  Filled 2023-09-19: qty 13.7

## 2023-09-19 MED ORDER — DEXAMETHASONE SODIUM PHOSPHATE 10 MG/ML IJ SOLN
10.0000 mg | Freq: Once | INTRAMUSCULAR | Status: AC
  Administered 2023-09-19: 10 mg via INTRAVENOUS
  Filled 2023-09-19: qty 1

## 2023-09-19 MED ORDER — HEPARIN SOD (PORK) LOCK FLUSH 100 UNIT/ML IV SOLN
500.0000 [IU] | Freq: Once | INTRAVENOUS | Status: DC | PRN
Start: 2023-09-19 — End: 2023-09-19

## 2023-09-19 NOTE — Progress Notes (Signed)
 Ok to tx with elevated BP and SCr=1.51 per Dr Arno Bibles

## 2023-09-19 NOTE — Progress Notes (Signed)
 [facility] Cancer Follow up:    [name], MD [address] [place] [address]   DIAGNOSIS:  Cancer Staging  Malignant neoplasm of lower-inner quadrant of left breast in female, estrogen receptor negative (HCC) Staging form: Breast, AJCC 8th Edition - Clinical stage from 08/28/2023: Stage IIB (cT2, cN0, cM0, G3, ER-, PR-, HER2-) - Signed by Murleen Arms, MD on 08/28/2023 Stage prefix: Initial diagnosis Histologic grading system: 3 grade system Laterality: Left Staged by: Pathologist and managing physician Stage used in treatment planning: Yes National guidelines used in treatment planning: Yes Type of national guideline used in treatment planning: NCCN    SUMMARY OF ONCOLOGIC HISTORY: Oncology History  Malignant neoplasm of lower-inner quadrant of left breast in female, estrogen receptor negative (HCC)  08/15/2023 Mammogram   Highly suspicious mass with associated calcifications in the left breast at 8 o'clock measuring 2.8 cm. The calcifications extend slightly outside of the mass by 0.5 cm or less. Resolution of the previously seen mass in the right breast at 12o'clock. No mammographic evidence of malignancy in the right breast.     08/20/2023 Pathology Results   Left breast needle core biopsy showed grade 3 IDC, triple negative.   08/26/2023 Initial Diagnosis   Malignant neoplasm of lower-inner quadrant of left breast in female, estrogen receptor negative (HCC)   08/28/2023 Cancer Staging   Staging form: Breast, AJCC 8th Edition - Clinical stage from 08/28/2023: Stage IIB (cT2, cN0, cM0, G3, ER-, PR-, HER2-) - Signed by Murleen Arms, MD on 08/28/2023 Stage prefix: Initial diagnosis Histologic grading system: 3 grade system Laterality: Left Staged by: Pathologist and managing physician Stage used in treatment planning: Yes National guidelines used in treatment planning: Yes Type of national guideline used in treatment planning: NCCN   09/19/2023  -  Chemotherapy   Patient is on Treatment Plan : BREAST TC q21d      Genetic Testing   [facility] CancerNext Panel+RNA was Negative. Report date is 09/08/2023.   The [facility] CancerNext+RNAinsight Panel includes sequencing, rearrangement analysis, and RNA analysis for the following 40 genes: APC, ATM, BAP1, BARD1, BMPR1A, BRCA1, BRCA2, BRIP1, CDH1, CDKN2A, CHEK2, FH, FLCN, MET, MLH1, MSH2, MSH6, MUTYH, NF1, NTHL1, PALB2, PMS2, PTEN, RAD51C, RAD51D, RPS20, SMAD4, STK11, TP53, TSC1, TSC2, and VHL (sequencing and deletion/duplication); AXIN2, HOXB13, MBD4, MSH3, POLD1 and POLE (sequencing only); EPCAM and GREM1 (deletion/duplication only).      CURRENT THERAPY: Taxotere /Cytoxan   INTERVAL HISTORY:  Discussed the use of AI scribe software for clinical note transcription with the patient, who gave verbal consent to proceed.  [name] is a 78 year old female with triple negative breast cancer who presents to begin chemotherapy with Taxotere  and Cytoxan .  She is starting chemotherapy today with Taxotere  and Cytoxan . She feels good with no pain from her port, which was placed the previous day. She has not yet taken her premedications for nausea but has them ready at home.  Her blood pressure is high today. She is on multiple antihypertensive medications, including hydralazine  three times daily. She follows up with Dr. Donnette Gal and sometimes Dr. Arlester Ladd for blood pressure management and missed a recent appointment with Dr. Arlester Ladd.  She has been prescribed Wellbutrin  for smoking cessation but has not started it yet. She has difficulty sleeping since moving from [place] , often staying up until 1 or 2 AM and waking up at 6 AM without an alarm.  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 She has informed her employer that she will not be driving long distances or when tired, especially on treatment days.    Patient Active Problem  List   Diagnosis Date Noted   Genetic testing 09/11/2023   Malignant neoplasm of lower-inner quadrant of left breast in female, estrogen receptor negative (HCC) 08/26/2023   CKD (chronic kidney disease) stage 3, GFR 30-59 ml/min (HCC) 06/16/2023   Muscle cramping 07/09/2022   Weight loss 05/04/2022   Subacute cough 05/04/2022   Breast lump on right side at 11 o'clock position 12/05/2021   Vitamin D  deficiency 12/04/2021   Low vitamin B12 level 12/04/2021   Clavicle enlargement 07/23/2018   Osteopenia 06/14/2018   Achilles tendinitis 01/29/2017   Bilateral carotid artery occlusion 01/29/2017   Neuropathy at site of breast surgery 01/28/2017   Prediabetes 07/24/2015   GERD (gastroesophageal reflux disease) 07/20/2015   Vitiligo 07/07/2012   PLANTAR FASCIITIS 06/16/2010   Tobacco dependence due to cigarettes 06/14/2009   Hyperlipidemia 11/10/2008   Angioedema 01/09/2008   Hypertension 06/12/2007   Personal history of malignant neoplasm of breast 02/25/2007   RHEUMATIC FEVER, HX OF 02/21/2007    is allergic to benazepril hcl and yellow dyes (non-tartrazine).  MEDICAL HISTORY: Past Medical History:  Diagnosis Date   Absence of menstruation    amenorrhea   Angioneurotic edema not elsewhere classified    due to ACE-I   Breast cancer (HCC)    Breast disorder    breast cancer 2007   Cancer [facility]) 2007   Left Breast Cancer   Carotid stenosis    Carotid US  (8/15):  RICA 40-59%; LICA 40-59% >>> FU 1 year   Chronic kidney disease    CKD3   GERD (gastroesophageal reflux disease)    H/O: rheumatic fever    History of breast cancer 07/13/2015   HLD (hyperlipidemia)    Occlusion and stenosis of carotid artery without mention of cerebral infarction    Other abnormal glucose    fasting hyperglycemia   Peripheral vascular disease (HCC)    Carotid Stenosis   Personal history of malignant neoplasm of breast    Personal history of radiation therapy 2007   Pneumonia    Pre-diabetes     Unspecified essential hypertension     SURGICAL HISTORY: Past Surgical History:  Procedure Laterality Date   APPENDECTOMY  1972   In [place]    BREAST BIOPSY Left 12/06/2005   malignant   BREAST BIOPSY Left 08/20/2023   US  LT BREAST BX W LOC DEV 1ST LESION IMG BX SPEC US  GUIDE 08/20/2023 [facility] MAMMOGRAPHY   BREAST LUMPECTOMY Left 01/08/2006   COLONOSCOPY  2005   negative; Dr Tova Fresh   COLONOSCOPY N/A 09/02/2015   Procedure: COLONOSCOPY;  Surgeon: Alyce Jubilee, MD;  Location: [facility];  Service: Endoscopy;  Laterality: N/A;  1215-moved to 1130 Ginger to notify pt    SOCIAL HISTORY: Social History   Socioeconomic History   Marital status: Single    Spouse name: Not on file   Number of children: Not on file   Years of education: Not on file   Highest education level: Not on file  Occupational History   Not on file  Tobacco Use   Smoking status: Every Day    Current packs/day: 0.50    Average packs/day: 0.5 packs/day for 48.0 years (24.0 ttl pk-yrs)    Types: Cigarettes    Passive exposure: Current   Smokeless tobacco: Never   Tobacco comments:    Smokes daily.  Vaping Use   Vaping status: Never Used  Substance and Sexual Activity   Alcohol use: Yes    Alcohol/week: 1.0 standard drink of alcohol    Types: 1 Standard drinks or equivalent per week    Comment: occasionally   Drug use: No   Sexual activity: Yes    Birth control/protection: Post-menopausal  Other Topics Concern   Not on file  Social History Narrative   Single, no children.   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    Social Drivers of Health   Financial Resource Strain: Medium Risk (05/31/2023)   Overall Financial Resource Strain (CARDIA)    Difficulty of Paying Living Expenses: Somewhat hard  Food Insecurity: No Food Insecurity (08/28/2023)   Hunger Vital Sign    Worried About Running Out of Food in the Last Year: Never true    Ran Out of Food in the Last Year: Never true   Transportation Needs: No Transportation Needs (08/28/2023)   PRAPARE - Administrator, Civil Service (Medical): No    Lack of Transportation (Non-Medical): No  Physical Activity: Sufficiently Active (05/31/2023)   Exercise Vital Sign    Days of Exercise per Week: 5 days    Minutes of Exercise per Session: 60 min  Stress: No Stress Concern Present (05/31/2023)   [employer] of Occupational Health - Occupational Stress Questionnaire    Feeling of Stress : Not at all  Social Connections: Moderately Isolated (05/31/2023)   Social Connection and Isolation Panel    Frequency of Communication with Friends and Family: More than three times a week    Frequency of Social Gatherings with Friends and Family: More than three times a week    Attends Religious Services: 1 to 4 times per year    Active Member of [employer] or Organizations: No    Attends Banker Meetings: Never    Marital Status: Never married  Intimate Partner Violence: Not At Risk (08/28/2023)   Humiliation, Afraid, Rape, and Kick questionnaire    Fear of Current or Ex-Partner: No    Emotionally Abused: No    Physically Abused: No    Sexually Abused: No    FAMILY HISTORY: Family History  Problem Relation Age of Onset   Breast cancer Sister 45   Hypertension Other        some of Pts brothers and sisters   Heart attack Neg Hx    Stroke Neg Hx    Diabetes Neg Hx    Heart disease Neg Hx     Review of Systems  Constitutional:  Negative for appetite change, chills, fatigue, fever and unexpected weight change.  HENT:   Negative for hearing loss, lump/mass and trouble swallowing.   Eyes:  Negative for eye problems and icterus.  Respiratory:  Negative for chest tightness, cough and shortness of breath.   Cardiovascular:  Negative for chest pain, leg swelling and palpitations.  Gastrointestinal:  Negative for abdominal distention, abdominal pain, constipation, diarrhea, nausea and vomiting.  Endocrine:  Negative for hot flashes.  Genitourinary:  Negative for difficulty urinating.   Musculoskeletal:  Negative for arthralgias.  Skin:  Negative for itching and rash.  Neurological:  Negative for dizziness, extremity weakness, headaches and numbness.  Hematological:  Negative for adenopathy. Does not bruise/bleed easily.  Psychiatric/Behavioral:  Negative for depression. The patient is not nervous/anxious.       PHYSICAL EXAMINATION    Vitals:   09/19/23 1118  BP: (!) 190/64  Pulse: [name])  51  Resp: 18  Temp: 99 F (37.2 C)  SpO2: 100%    Physical Exam Constitutional:      General: She is not in acute distress.    Appearance: Normal appearance. She is not toxic-appearing.  HENT:     Head: Normocephalic and atraumatic.     Mouth/Throat:     Mouth: Mucous membranes are moist.     Pharynx: Oropharynx is clear. No oropharyngeal exudate or posterior oropharyngeal erythema.   Eyes:     General: No scleral icterus.   Cardiovascular:     Rate and Rhythm: Normal rate and regular rhythm.     Pulses: Normal pulses.     Heart sounds: Normal heart sounds.  Pulmonary:     Effort: Pulmonary effort is normal.     Breath sounds: Normal breath sounds.  Abdominal:     General: Abdomen is flat. Bowel sounds are normal. There is no distension.     Palpations: Abdomen is soft.     Tenderness: There is no abdominal tenderness.   Musculoskeletal:        General: No swelling.     Cervical back: Neck supple.  Lymphadenopathy:     Cervical: No cervical adenopathy.   Skin:    General: Skin is warm and dry.     Findings: No rash.   Neurological:     General: No focal deficit present.     Mental Status: She is alert.   Psychiatric:        Mood and Affect: Mood normal.        Behavior: Behavior normal.    LABORATORY DATA:  CBC    Component Value Date/Time   WBC 6.9 09/19/2023 1057   WBC 9.4 05/04/2022 1355   RBC 4.13 09/19/2023 1057   HGB 11.5 (L) 09/19/2023 1057   HGB 13.3  06/27/2020 1354   HGB 12.4 05/07/2011 1356   HCT 35.1 (L) 09/19/2023 1057   HCT 40.3 06/27/2020 1354   HCT 37.8 05/07/2011 1356   PLT 191 09/19/2023 1057   PLT 287 06/27/2020 1354   MCV 85.0 09/19/2023 1057   MCV 85 06/27/2020 1354   MCV 84.3 05/07/2011 1356   MCH 27.8 09/19/2023 1057   MCHC 32.8 09/19/2023 1057   RDW 13.5 09/19/2023 1057   RDW 13.1 06/27/2020 1354   RDW 12.9 05/07/2011 1356   LYMPHSABS 2.7 09/19/2023 1057   LYMPHSABS 4.4 (H) 06/27/2020 1354   LYMPHSABS 5.1 (H) 05/07/2011 1356   MONOABS 0.4 09/19/2023 1057   MONOABS 0.5 05/07/2011 1356   EOSABS 0.1 09/19/2023 1057   EOSABS 0.1 06/27/2020 1354   BASOSABS 0.0 09/19/2023 1057   BASOSABS 0.0 06/27/2020 1354   BASOSABS 0.1 05/07/2011 1356    CMP     Component Value Date/Time   NA 139 08/28/2023 1201   NA 136 06/27/2020 1354   K 3.8 08/28/2023 1201   CL 103 08/28/2023 1201   CO2 25 08/28/2023 1201   GLUCOSE 107 (H) 08/28/2023 1201   BUN 42 (H) 08/28/2023 1201   BUN 15 06/27/2020 1354   CREATININE 1.49 (H) 08/28/2023 1201   CALCIUM  9.9 08/28/2023 1201   PROT 8.2 (H) 08/28/2023 1201   PROT 7.5 01/31/2021 1027   ALBUMIN 4.5 08/28/2023 1201   ALBUMIN 4.3 01/31/2021 1027   AST 12 (L) 08/28/2023 1201   ALT 7 08/28/2023 1201   ALKPHOS 66 08/28/2023 1201   BILITOT 0.7 08/28/2023 1201   GFRNONAA 36 (L) 08/28/2023 1201  ASSESSMENT and THERAPY PLAN:   Triple negative breast cancer Initiated chemotherapy with Taxotere  and Cytoxan . Monitoring closely for tolerance. Discussed potential side effects and advised on when to contact the clinic. Informed about driving restrictions post-treatment. - Administer Taxotere  and Cytoxan  chemotherapy. - Monitor for side effects during and after treatment. - Advise against driving post-treatment. - Schedule follow-up appointment next week to assess tolerance. - Ensure hydration and consider alkaline water  for palatability. - Provide port access information and ensure  port is accessed for treatment.  Hypertension Blood pressure elevated. On antihypertensive medications. Wellbutrin  prescribed for smoking cessation, to start post-chemotherapy. - Continue current antihypertensive medications. - Recommended that she keep log of BPs and if still hypertensive next week at her appointment consider increasing Hydralazine  to 75mg  PO TID (per Jeronimo Moors, PA-C with Dr. Arlester Ladd).  - Start Wellbutrin  the day after chemotherapy for smoking cessation. - Follow up with Dr. Donnette Gal and Dr. Arlester Ladd for hypertension management.  RTC in 1 week for f/u   All questions were answered. The patient knows to call the clinic with any problems, questions or concerns. We can certainly see the patient much sooner if necessary.  Total encounter time:30 minutes*in face-to-face visit time, chart review, lab review, care coordination, order entry, and documentation of the encounter time.    Alwin Baars, NP 09/19/23 11:50 AM Medical Oncology and Hematology [facility] [address] [place], [place] [address] Tel. [phone]    Fax. [phone]  *Total Encounter Time as defined by the Centers for Medicare and Medicaid Services includes, in addition to the face-to-face time of a patient visit (documented in the note above) non-face-to-face time: obtaining and reviewing outside history, ordering and reviewing medications, tests or procedures, care coordination (communications with other health care professionals or caregivers) and documentation in the medical record.

## 2023-09-19 NOTE — Patient Instructions (Signed)
 [facility] - A DEPT OF [facility]  Discharge Instructions: Thank you for choosing [facility] to provide your oncology and hematology care.   If you have a lab appointment with the Cancer Center, please go directly to the Cancer Center and check in at the registration area.   Wear comfortable clothing and clothing appropriate for easy access to any Portacath or PICC line.   We strive to give you quality time with your provider. You may need to reschedule your appointment if you arrive late (15 or more minutes).  Arriving late affects you and other patients whose appointments are after yours.  Also, if you miss three or more appointments without notifying the office, you may be dismissed from the clinic at the provider's discretion.      For prescription refill requests, have your pharmacy contact our office and allow 72 hours for refills to be completed.    Today you received the following chemotherapy and/or immunotherapy agents docetaxel and cytoxan      To help prevent nausea and vomiting after your treatment, we encourage you to take your nausea medication as directed.  BELOW ARE SYMPTOMS THAT SHOULD BE REPORTED IMMEDIATELY: *FEVER GREATER THAN 100.4 F (38 C) OR HIGHER *CHILLS OR SWEATING *NAUSEA AND VOMITING THAT IS NOT CONTROLLED WITH YOUR NAUSEA MEDICATION *UNUSUAL SHORTNESS OF BREATH *UNUSUAL BRUISING OR BLEEDING *URINARY PROBLEMS (pain or burning when urinating, or frequent urination) *BOWEL PROBLEMS (unusual diarrhea, constipation, pain near the anus) TENDERNESS IN MOUTH AND THROAT WITH OR WITHOUT PRESENCE OF ULCERS (sore throat, sores in mouth, or a toothache) UNUSUAL RASH, SWELLING OR PAIN  UNUSUAL VAGINAL DISCHARGE OR ITCHING   Items with * indicate a potential emergency and should be followed up as soon as possible or go to the Emergency Department if any problems should occur.  Please show the CHEMOTHERAPY ALERT CARD or  IMMUNOTHERAPY ALERT CARD at check-in to the Emergency Department and triage nurse.  Should you have questions after your visit or need to cancel or reschedule your appointment, please contact [facility] - A DEPT OF [facility]  Dept: [phone]  and follow the prompts.  Office hours are 8:00 a.m. to 4:30 p.m. Monday - Friday. Please note that voicemails left after 4:00 p.m. may not be returned until the following business day.  We are closed weekends and major holidays. You have access to a nurse at all times for urgent questions. Please call the main number to the clinic Dept: [phone] and follow the prompts.   For any non-urgent questions, you may also contact your provider using MyChart. We now offer e-Visits for anyone 31 and older to request care online for non-urgent symptoms. For details visit [email or web address].   Also download the MyChart app! Go to the app store, search "MyChart", open the app, select [place], and log in with your MyChart username and password.

## 2023-09-20 ENCOUNTER — Encounter: Payer: Self-pay | Admitting: Hematology and Oncology

## 2023-09-20 ENCOUNTER — Other Ambulatory Visit: Payer: Self-pay | Admitting: *Deleted

## 2023-09-20 ENCOUNTER — Encounter: Payer: Self-pay | Admitting: *Deleted

## 2023-09-20 DIAGNOSIS — C50312 Malignant neoplasm of lower-inner quadrant of left female breast: Secondary | ICD-10-CM

## 2023-09-20 NOTE — Anesthesia Postprocedure Evaluation (Signed)
 Anesthesia Post Note  Patient: [name]  Procedure(s) Performed: INSERTION, TUNNELED CENTRAL VENOUS DEVICE, WITH PORT (Chest)     Patient location during evaluation: PACU Anesthesia Type: General Level of consciousness: awake and alert Pain management: pain level controlled Vital Signs Assessment: post-procedure vital signs reviewed and stable Respiratory status: spontaneous breathing, nonlabored ventilation, respiratory function stable and patient connected to nasal cannula oxygen Cardiovascular status: blood pressure returned to baseline and stable Postop Assessment: no apparent nausea or vomiting Anesthetic complications: no   No notable events documented.  Last Vitals:  Vitals:   09/18/23 1630 09/18/23 1645  BP: (!) 177/68 (!) 183/62  Pulse: 60 (!) 56  Resp: 15 14  Temp:  (!) 36.4 C  SpO2: 97% 93%    Last Pain:  Vitals:   09/18/23 1645  TempSrc:   PainSc: 0-No pain                 [name]

## 2023-09-20 NOTE — Telephone Encounter (Signed)
 Called pt to see how she did with her treatment.  She states I wonder if I got anything b/c I feel fine.  Discussed steroids.  She had not taken so she is going to take now with food & repeat tomorrow @ lunch.  Discussed claritin & reminded to get & take before her injection tomorrow to prevent bone pain.  Reminded of next appts.

## 2023-09-20 NOTE — Telephone Encounter (Signed)
-----   Message from Nurse Autry Boas sent at 09/20/2023  1:37 PM EDT ----- Regarding: Dr. Arno Bibles First time taxotere  and cytoxan  Dr. Arno Bibles pt first time  taxotere  and cytoxan   Tolerated well

## 2023-09-21 ENCOUNTER — Inpatient Hospital Stay

## 2023-09-21 VITALS — BP 181/57 | HR 58 | Resp 19

## 2023-09-21 DIAGNOSIS — C50312 Malignant neoplasm of lower-inner quadrant of left female breast: Secondary | ICD-10-CM | POA: Diagnosis not present

## 2023-09-21 DIAGNOSIS — Z5189 Encounter for other specified aftercare: Secondary | ICD-10-CM | POA: Diagnosis not present

## 2023-09-21 DIAGNOSIS — Z5111 Encounter for antineoplastic chemotherapy: Secondary | ICD-10-CM | POA: Diagnosis not present

## 2023-09-21 DIAGNOSIS — Z17421 Hormone receptor negative with human epidermal growth factor receptor 2 negative status: Secondary | ICD-10-CM | POA: Diagnosis not present

## 2023-09-21 DIAGNOSIS — F17210 Nicotine dependence, cigarettes, uncomplicated: Secondary | ICD-10-CM | POA: Diagnosis not present

## 2023-09-21 MED ORDER — PEGFILGRASTIM-JMDB 6 MG/0.6ML ~~LOC~~ SOSY
6.0000 mg | PREFILLED_SYRINGE | Freq: Once | SUBCUTANEOUS | Status: AC
  Administered 2023-09-21: 6 mg via SUBCUTANEOUS
  Filled 2023-09-21: qty 0.6

## 2023-09-21 NOTE — Patient Instructions (Signed)

## 2023-09-22 ENCOUNTER — Encounter: Payer: Self-pay | Admitting: Hematology and Oncology

## 2023-09-23 ENCOUNTER — Other Ambulatory Visit: Payer: Self-pay | Admitting: Hematology and Oncology

## 2023-09-23 DIAGNOSIS — Z171 Estrogen receptor negative status [ER-]: Secondary | ICD-10-CM

## 2023-09-24 ENCOUNTER — Telehealth: Payer: Self-pay

## 2023-09-24 NOTE — Telephone Encounter (Signed)
 Left message to confirm appt for 6/19

## 2023-09-25 ENCOUNTER — Other Ambulatory Visit: Payer: Self-pay | Admitting: *Deleted

## 2023-09-25 DIAGNOSIS — Z803 Family history of malignant neoplasm of breast: Secondary | ICD-10-CM

## 2023-09-25 DIAGNOSIS — Z171 Estrogen receptor negative status [ER-]: Secondary | ICD-10-CM

## 2023-09-26 ENCOUNTER — Inpatient Hospital Stay

## 2023-09-26 ENCOUNTER — Inpatient Hospital Stay (HOSPITAL_BASED_OUTPATIENT_CLINIC_OR_DEPARTMENT_OTHER): Admitting: Hematology and Oncology

## 2023-09-26 ENCOUNTER — Ambulatory Visit: Payer: Self-pay | Admitting: Genetic Counselor

## 2023-09-26 VITALS — BP 160/60 | HR 55 | Temp 98.2°F | Resp 17 | Wt 166.6 lb

## 2023-09-26 DIAGNOSIS — C50312 Malignant neoplasm of lower-inner quadrant of left female breast: Secondary | ICD-10-CM

## 2023-09-26 DIAGNOSIS — Z171 Estrogen receptor negative status [ER-]: Secondary | ICD-10-CM

## 2023-09-26 DIAGNOSIS — Z803 Family history of malignant neoplasm of breast: Secondary | ICD-10-CM

## 2023-09-26 DIAGNOSIS — F17210 Nicotine dependence, cigarettes, uncomplicated: Secondary | ICD-10-CM | POA: Diagnosis not present

## 2023-09-26 DIAGNOSIS — I10 Essential (primary) hypertension: Secondary | ICD-10-CM | POA: Diagnosis not present

## 2023-09-26 DIAGNOSIS — Z1379 Encounter for other screening for genetic and chromosomal anomalies: Secondary | ICD-10-CM

## 2023-09-26 DIAGNOSIS — Z5111 Encounter for antineoplastic chemotherapy: Secondary | ICD-10-CM | POA: Diagnosis not present

## 2023-09-26 DIAGNOSIS — Z17421 Hormone receptor negative with human epidermal growth factor receptor 2 negative status: Secondary | ICD-10-CM | POA: Diagnosis not present

## 2023-09-26 DIAGNOSIS — Z5189 Encounter for other specified aftercare: Secondary | ICD-10-CM | POA: Diagnosis not present

## 2023-09-26 LAB — CBC WITH DIFFERENTIAL (CANCER CENTER ONLY)
Abs Immature Granulocytes: 0.23 10*3/uL — ABNORMAL HIGH (ref 0.00–0.07)
Basophils Absolute: 0 10*3/uL (ref 0.0–0.1)
Basophils Relative: 1 %
Eosinophils Absolute: 0.1 10*3/uL (ref 0.0–0.5)
Eosinophils Relative: 1 %
HCT: 31.6 % — ABNORMAL LOW (ref 36.0–46.0)
Hemoglobin: 10.3 g/dL — ABNORMAL LOW (ref 12.0–15.0)
Immature Granulocytes: 6 %
Lymphocytes Relative: 51 %
Lymphs Abs: 2 10*3/uL (ref 0.7–4.0)
MCH: 27.9 pg (ref 26.0–34.0)
MCHC: 32.6 g/dL (ref 30.0–36.0)
MCV: 85.6 fL (ref 80.0–100.0)
Monocytes Absolute: 0.4 10*3/uL (ref 0.1–1.0)
Monocytes Relative: 11 %
Neutro Abs: 1.2 10*3/uL — ABNORMAL LOW (ref 1.7–7.7)
Neutrophils Relative %: 30 %
Platelet Count: 150 10*3/uL (ref 150–400)
RBC: 3.69 MIL/uL — ABNORMAL LOW (ref 3.87–5.11)
RDW: 13.2 % (ref 11.5–15.5)
Smear Review: NORMAL
WBC Count: 3.8 10*3/uL — ABNORMAL LOW (ref 4.0–10.5)
nRBC: 0 % (ref 0.0–0.2)

## 2023-09-26 LAB — CMP (CANCER CENTER ONLY)
ALT: 10 U/L (ref 0–44)
AST: 11 U/L — ABNORMAL LOW (ref 15–41)
Albumin: 3.7 g/dL (ref 3.5–5.0)
Alkaline Phosphatase: 63 U/L (ref 38–126)
Anion gap: 7 (ref 5–15)
BUN: 22 mg/dL (ref 8–23)
CO2: 24 mmol/L (ref 22–32)
Calcium: 8.7 mg/dL — ABNORMAL LOW (ref 8.9–10.3)
Chloride: 111 mmol/L (ref 98–111)
Creatinine: 1.22 mg/dL — ABNORMAL HIGH (ref 0.44–1.00)
GFR, Estimated: 46 mL/min — ABNORMAL LOW (ref >60)
Glucose, Bld: 95 mg/dL (ref 70–99)
Potassium: 4 mmol/L (ref 3.5–5.1)
Sodium: 142 mmol/L (ref 135–145)
Total Bilirubin: 0.6 mg/dL (ref 0.0–1.2)
Total Protein: 6.4 g/dL — ABNORMAL LOW (ref 6.5–8.1)

## 2023-09-26 MED ORDER — SODIUM CHLORIDE 0.9% FLUSH
10.0000 mL | INTRAVENOUS | Status: AC | PRN
  Administered 2023-09-26: 10 mL

## 2023-09-26 MED ORDER — HYDRALAZINE HCL 25 MG PO TABS: 25.0000 mg | ORAL_TABLET | Freq: Three times a day (TID) | ORAL | 0 refills | Status: DC

## 2023-09-26 MED ORDER — HEPARIN SOD (PORK) LOCK FLUSH 100 UNIT/ML IV SOLN
500.0000 [IU] | INTRAVENOUS | Status: AC | PRN
  Administered 2023-09-26: 500 [IU]

## 2023-09-26 NOTE — Progress Notes (Signed)
 HPI:   [name] was previously seen in the [facility] due to a personal and family history of cancer and concerns regarding a hereditary predisposition to cancer. Please refer to our prior cancer genetics clinic note for more information regarding our discussion, assessment and recommendations, at the time. [name] recent genetic test results were disclosed to her, as were recommendations warranted by these results. These results and recommendations are discussed in more detail below.  CANCER HISTORY:  Oncology History  Malignant neoplasm of lower-inner quadrant of left breast in female, estrogen receptor negative (HCC)  08/15/2023 Mammogram   Highly suspicious mass with associated calcifications in the left breast at 8 o'clock measuring 2.8 cm. The calcifications extend slightly outside of the mass by 0.5 cm or less. Resolution of the previously seen mass in the right breast at 12o'clock. No mammographic evidence of malignancy in the right breast.     08/20/2023 Pathology Results   Left breast needle core biopsy showed grade 3 IDC, triple negative.   08/26/2023 Initial Diagnosis   Malignant neoplasm of lower-inner quadrant of left breast in female, estrogen receptor negative (HCC)   08/28/2023 Cancer Staging   Staging form: Breast, AJCC 8th Edition - Clinical stage from 08/28/2023: Stage IIB (cT2, cN0, cM0, G3, ER-, PR-, HER2-) - Signed by Murleen Arms, MD on 08/28/2023 Stage prefix: Initial diagnosis Histologic grading system: 3 grade system Laterality: Left Staged by: Pathologist and managing physician Stage used in treatment planning: Yes National guidelines used in treatment planning: Yes Type of national guideline used in treatment planning: NCCN   09/19/2023 -  Chemotherapy   Patient is on Treatment Plan : BREAST TC q21d      Genetic Testing   [facility] CancerNext Panel+RNA was Negative. Report date is 09/08/2023.   The [facility] CancerNext+RNAinsight Panel  includes sequencing, rearrangement analysis, and RNA analysis for the following 40 genes: APC, ATM, BAP1, BARD1, BMPR1A, BRCA1, BRCA2, BRIP1, CDH1, CDKN2A, CHEK2, FH, FLCN, MET, MLH1, MSH2, MSH6, MUTYH, NF1, NTHL1, PALB2, PMS2, PTEN, RAD51C, RAD51D, RPS20, SMAD4, STK11, TP53, TSC1, TSC2, and VHL (sequencing and deletion/duplication); AXIN2, HOXB13, MBD4, MSH3, POLD1 and POLE (sequencing only); EPCAM and GREM1 (deletion/duplication only).      FAMILY HISTORY:  We obtained a detailed, 4-generation family history.  Significant diagnoses are listed below:      Family History  Problem Relation Age of Onset   Breast cancer Sister 39   Hypertension Other          some of Pts brothers and sisters   Heart attack Neg Hx     Stroke Neg Hx     Diabetes Neg Hx     Heart disease Neg Hx                 Ms. [name] is unaware of previous family history of genetic testing for hereditary cancer risks. There is no reported Ashkenazi Jewish ancestry.    GENETIC TEST RESULTS:  The [facility] CancerNext Panel found no pathogenic mutations.   The [facility] CancerNext+RNAinsight Panel includes sequencing, rearrangement analysis, and RNA analysis for the following 40 genes: APC, ATM, BAP1, BARD1, BMPR1A, BRCA1, BRCA2, BRIP1, CDH1, CDKN2A, CHEK2, FH, FLCN, MET, MLH1, MSH2, MSH6, MUTYH, NF1, NTHL1, PALB2, PMS2, PTEN, RAD51C, RAD51D, RPS20, SMAD4, STK11, TP53, TSC1, TSC2, and VHL (sequencing and deletion/duplication); AXIN2, HOXB13, MBD4, MSH3, POLD1 and POLE (sequencing only); EPCAM and GREM1 (deletion/duplication only).   The test report has been scanned into EPIC and is located under the Molecular  Pathology section of the Results Review tab.  A portion of the result report is included below for reference. Genetic testing reported out on 09/08/2023.      Even though a pathogenic variant was not identified, possible explanations for the cancer in the family may include: There may be no hereditary risk for cancer in the  family. The cancers in Ms. Lasseigne and/or her family may be due to other genetic or environmental factors. There may be a gene mutation in one of these genes that current testing methods cannot detect, but that chance is small. There could be another gene that has not yet been discovered, or that we have not yet tested, that is responsible for the cancer diagnoses in the family.  Therefore, it is important to remain in touch with cancer genetics in the future so that we can continue to offer Ms. Grabe the most up to date genetic testing.   ADDITIONAL GENETIC TESTING:  We discussed with Ms. Dippolito that her genetic testing was fairly extensive.  If there are genes identified to increase cancer risk that can be analyzed in the future, we would be happy to discuss and coordinate this testing at that time.    CANCER SCREENING RECOMMENDATIONS:  Ms. Ting test result is considered negative (normal).  This means that we have not identified a hereditary cause for her personal and family history of cancer at this time. Most cancers happen by chance and this negative test suggests that her cancer may fall into this category.    An individual's cancer risk and medical management are not determined by genetic test results alone. Overall cancer risk assessment incorporates additional factors, including personal medical history, family history, and any available genetic information that may result in a personalized plan for cancer prevention and surveillance. Therefore, it is recommended she continue to follow the cancer management and screening guidelines provided by her oncology and primary healthcare provider.  FOLLOW-UP:  Cancer genetics is a rapidly advancing field and it is possible that new genetic tests will be appropriate for her and/or her family members in the future. We encouraged her to remain in contact with cancer genetics on an annual basis so we can update her personal and family histories and let  her know of advances in cancer genetics that may benefit this family.   Our contact number was provided. Ms. [name] questions were answered to her satisfaction, and she knows she is welcome to call us  at anytime with additional questions or concerns.   Goerge Mohr, MS, [facility] Genetic Counselor [email or web address] (P) [phone]

## 2023-09-26 NOTE — Progress Notes (Signed)
 [facility] Cancer Follow up:    Kimberly Dauphin, MD [address] [place] [address]   DIAGNOSIS:  Cancer Staging  Malignant neoplasm of lower-inner quadrant of left breast in female, estrogen receptor negative (HCC) Staging form: Breast, AJCC 8th Edition - Clinical stage from 08/28/2023: Stage IIB (cT2, cN0, cM0, G3, ER-, PR-, HER2-) - Signed by Murleen Arms, MD on 08/28/2023 Stage prefix: Initial diagnosis Histologic grading system: 3 grade system Laterality: Left Staged by: Pathologist and managing physician Stage used in treatment planning: Yes National guidelines used in treatment planning: Yes Type of national guideline used in treatment planning: NCCN    SUMMARY OF ONCOLOGIC HISTORY: Oncology History  Malignant neoplasm of lower-inner quadrant of left breast in female, estrogen receptor negative (HCC)  08/15/2023 Mammogram   Highly suspicious mass with associated calcifications in the left breast at 8 o'clock measuring 2.8 cm. The calcifications extend slightly outside of the mass by 0.5 cm or less. Resolution of the previously seen mass in the right breast at 12o'clock. No mammographic evidence of malignancy in the right breast.     08/20/2023 Pathology Results   Left breast needle core biopsy showed grade 3 IDC, triple negative.   08/26/2023 Initial Diagnosis   Malignant neoplasm of lower-inner quadrant of left breast in female, estrogen receptor negative (HCC)   08/28/2023 Cancer Staging   Staging form: Breast, AJCC 8th Edition - Clinical stage from 08/28/2023: Stage IIB (cT2, cN0, cM0, G3, ER-, PR-, HER2-) - Signed by Murleen Arms, MD on 08/28/2023 Stage prefix: Initial diagnosis Histologic grading system: 3 grade system Laterality: Left Staged by: Pathologist and managing physician Stage used in treatment planning: Yes National guidelines used in treatment planning: Yes Type of national guideline used in treatment planning: NCCN   09/19/2023  -  Chemotherapy   Patient is on Treatment Plan : BREAST TC q21d      Genetic Testing   Ambry CancerNext Panel+RNA was Negative. Report date is 09/08/2023.   The Ambry CancerNext+RNAinsight Panel includes sequencing, rearrangement analysis, and RNA analysis for the following 40 genes: APC, ATM, BAP1, BARD1, BMPR1A, BRCA1, BRCA2, BRIP1, CDH1, CDKN2A, CHEK2, FH, FLCN, MET, MLH1, MSH2, MSH6, MUTYH, NF1, NTHL1, PALB2, PMS2, PTEN, RAD51C, RAD51D, RPS20, SMAD4, STK11, TP53, TSC1, TSC2, and VHL (sequencing and deletion/duplication); AXIN2, HOXB13, MBD4, MSH3, POLD1 and POLE (sequencing only); EPCAM and GREM1 (deletion/duplication only).      CURRENT THERAPY: Taxotere /Cytoxan   INTERVAL HISTORY:  [name] is a 78 year old female with triple negative breast cancer who presents to begin chemotherapy with Taxotere  and Cytoxan .  Discussed the use of AI scribe software for clinical note transcription with the patient, who gave verbal consent to proceed.  History of Present Illness [name] is a 78 year old female undergoing chemotherapy who presents for follow-up after her first cycle.  She has not experienced any changes in her condition since the last visit and reports no sickness or side effects from the chemotherapy. She maintains her normal activities, including traveling to [facility], [place], and [place].  She received her first round of chemotherapy on September 19, 2023, and is scheduled for her second cycle on October 09, 2023. She has not experienced any nausea or pain requiring medication and continues to work at [employer] in [place], where she is affectionately called 'mom' by her colleagues.  She is currently taking labetalol , amlodipine , and hydralazine  for hypertension. Amlodipine  is at the maximum dose of 10 mg daily,  She has a  blood pressure monitor at home but does not check it often. Her sister, who is a Engineer, civil (consulting), assists her with monitoring.  She has started taking  Wellbutrin  for smoking cessation and reports that it is helping, as she has not had a cigarette that morning.  No pain, bowel movement issues, or tingling and numbness in her hands and feet.   Patient Active Problem List   Diagnosis Date Noted   Genetic testing 09/11/2023   Malignant neoplasm of lower-inner quadrant of left breast in female, estrogen receptor negative (HCC) 08/26/2023   CKD (chronic kidney disease) stage 3, GFR 30-59 ml/min (HCC) 06/16/2023   Muscle cramping 07/09/2022   Weight loss 05/04/2022   Subacute cough 05/04/2022   Breast lump on right side at 11 o'clock position 12/05/2021   Vitamin D  deficiency 12/04/2021   Low vitamin B12 level 12/04/2021   Clavicle enlargement 07/23/2018   Osteopenia 06/14/2018   Achilles tendinitis 01/29/2017   Bilateral carotid artery occlusion 01/29/2017   Neuropathy at site of breast surgery 01/28/2017   Prediabetes 07/24/2015   GERD (gastroesophageal reflux disease) 07/20/2015   Vitiligo 07/07/2012   PLANTAR FASCIITIS 06/16/2010   Tobacco dependence due to cigarettes 06/14/2009   Hyperlipidemia 11/10/2008   Angioedema 01/09/2008   Hypertension 06/12/2007   Personal history of malignant neoplasm of breast 02/25/2007   RHEUMATIC FEVER, HX OF 02/21/2007    is allergic to benazepril hcl and yellow dyes (non-tartrazine).  MEDICAL HISTORY: Past Medical History:  Diagnosis Date   Absence of menstruation    amenorrhea   Angioneurotic edema not elsewhere classified    due to ACE-I   Breast cancer (HCC)    Breast disorder    breast cancer 2007   Cancer [facility]) 2007   Left Breast Cancer   Carotid stenosis    Carotid US  (8/15):  RICA 40-59%; LICA 40-59% >>> FU 1 year   Chronic kidney disease    CKD3   GERD (gastroesophageal reflux disease)    H/O: rheumatic fever    History of breast cancer 07/13/2015   HLD (hyperlipidemia)    Occlusion and stenosis of carotid artery without mention of cerebral infarction    Other abnormal  glucose    fasting hyperglycemia   Peripheral vascular disease (HCC)    Carotid Stenosis   Personal history of malignant neoplasm of breast    Personal history of radiation therapy 2007   Pneumonia    Pre-diabetes    Unspecified essential hypertension     SURGICAL HISTORY: Past Surgical History:  Procedure Laterality Date   APPENDECTOMY  1972   In [place]    BREAST BIOPSY Left 12/06/2005   malignant   BREAST BIOPSY Left 08/20/2023   US  LT BREAST BX W LOC DEV 1ST LESION IMG BX SPEC US  GUIDE 08/20/2023 [facility] MAMMOGRAPHY   BREAST LUMPECTOMY Left 01/08/2006   COLONOSCOPY  2005   negative; Dr Tova Fresh   COLONOSCOPY N/A 09/02/2015   Procedure: COLONOSCOPY;  Surgeon: Alyce Jubilee, MD;  Location: [facility] SUITE;  Service: Endoscopy;  Laterality: N/A;  1215-moved to 1130 Ginger to notify pt   PORTACATH PLACEMENT N/A 09/18/2023   Procedure: INSERTION, TUNNELED CENTRAL VENOUS DEVICE, WITH PORT;  Surgeon: Sim Dryer, MD;  Location: [facility] OR;  Service: General;  Laterality: N/A;    SOCIAL HISTORY: Social History   Socioeconomic History   Marital status: Single    Spouse name: Not on file   Number of children: Not on file   Years of education: Not on file  Highest education level: Not on file  Occupational History   Not on file  Tobacco Use   Smoking status: Every Day    Current packs/day: 0.50    Average packs/day: 0.5 packs/day for 48.0 years (24.0 ttl pk-yrs)    Types: Cigarettes    Passive exposure: Current   Smokeless tobacco: Never   Tobacco comments:    Smokes daily.    Vaping Use   Vaping status: Never Used  Substance and Sexual Activity   Alcohol use: Yes    Alcohol/week: 1.0 standard drink of alcohol    Types: 1 Standard drinks or equivalent per week    Comment: occasionally   Drug use: No   Sexual activity: Yes    Birth control/protection: Post-menopausal  Other Topics Concern   Not on file  Social History Narrative   Single, no children.   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    Social Drivers of Health   Financial Resource Strain: Medium Risk (05/31/2023)   Overall Financial Resource Strain (CARDIA)    Difficulty of Paying Living Expenses: Somewhat hard  Food Insecurity: No Food Insecurity (08/28/2023)   Hunger Vital Sign    Worried About Running Out of Food in the Last Year: Never true    Ran Out of Food in the Last Year: Never true  Transportation Needs: No Transportation Needs (08/28/2023)   PRAPARE - Administrator, Civil Service (Medical): No    Lack of Transportation (Non-Medical): No  Physical Activity: Sufficiently Active (05/31/2023)   Exercise Vital Sign    Days of Exercise per Week: 5 days    Minutes of Exercise per Session: 60 min  Stress: No Stress Concern Present (05/31/2023)   [employer] of Occupational Health - Occupational Stress Questionnaire    Feeling of Stress : Not at all  Social Connections: Moderately Isolated (05/31/2023)   Social Connection and Isolation Panel    Frequency of Communication with Friends and Family: More than three times a week    Frequency of Social Gatherings with Friends and Family: More than three times a week    Attends Religious Services: 1 to 4 times per year    Active Member of [employer] or Organizations: No    Attends Banker Meetings: Never    Marital Status: Never married  Intimate Partner Violence: Not At Risk (08/28/2023)   Humiliation, Afraid, Rape, and Kick questionnaire    Fear of Current or Ex-Partner: No    Emotionally Abused: No    Physically Abused: No    Sexually Abused: No    FAMILY HISTORY: Family History  Problem Relation Age of Onset   Breast cancer Sister 7   Hypertension Other        some of Pts brothers and sisters   Heart attack Neg Hx    Stroke Neg Hx    Diabetes Neg Hx    Heart disease Neg Hx     Review of Systems  Constitutional:  Negative for appetite change, chills, fatigue, fever and unexpected weight change.   HENT:   Negative for hearing loss, lump/mass and trouble swallowing.   Eyes:  Negative for eye problems and icterus.  Respiratory:  Negative for chest tightness, cough and shortness of breath.   Cardiovascular:  Negative for chest pain, leg swelling and palpitations.  Gastrointestinal:  Negative for abdominal distention, abdominal pain, constipation, diarrhea, nausea and vomiting.  Endocrine: Negative for hot flashes.  Genitourinary:  Negative for difficulty urinating.  Musculoskeletal:  Negative for arthralgias.  Skin:  Negative for itching and rash.  Neurological:  Negative for dizziness, extremity weakness, headaches and numbness.  Hematological:  Negative for adenopathy. Does not bruise/bleed easily.  Psychiatric/Behavioral:  Negative for depression. The patient is not nervous/anxious.       PHYSICAL EXAMINATION    Vitals:   09/26/23 0946 09/26/23 0947  BP: (!) 184/61 (!) 160/60  Pulse: (!) 55   Resp: 17   Temp: 98.2 F (36.8 C)   SpO2: 94%     Physical Exam Constitutional:      General: She is not in acute distress.    Appearance: Normal appearance. She is not toxic-appearing.  HENT:     Head: Normocephalic and atraumatic.     Mouth/Throat:     Mouth: Mucous membranes are moist.     Pharynx: Oropharynx is clear. No oropharyngeal exudate or posterior oropharyngeal erythema.   Eyes:     General: No scleral icterus.   Cardiovascular:     Rate and Rhythm: Normal rate and regular rhythm.     Pulses: Normal pulses.     Heart sounds: Normal heart sounds.  Pulmonary:     Effort: Pulmonary effort is normal.     Breath sounds: Normal breath sounds.  Abdominal:     General: Abdomen is flat. Bowel sounds are normal. There is no distension.     Palpations: Abdomen is soft.     Tenderness: There is no abdominal tenderness.   Musculoskeletal:        General: No swelling.     Cervical back: Neck supple.  Lymphadenopathy:     Cervical: No cervical adenopathy.    Skin:    General: Skin is warm and dry.     Findings: No rash.   Neurological:     General: No focal deficit present.     Mental Status: She is alert.   Psychiatric:        Mood and Affect: Mood normal.        Behavior: Behavior normal.     LABORATORY DATA:  CBC    Component Value Date/Time   WBC 3.8 (L) 09/26/2023 0920   WBC 9.4 05/04/2022 1355   RBC 3.69 (L) 09/26/2023 0920   HGB 10.3 (L) 09/26/2023 0920   HGB 13.3 06/27/2020 1354   HGB 12.4 05/07/2011 1356   HCT 31.6 (L) 09/26/2023 0920   HCT 40.3 06/27/2020 1354   HCT 37.8 05/07/2011 1356   PLT 150 09/26/2023 0920   PLT 287 06/27/2020 1354   MCV 85.6 09/26/2023 0920   MCV 85 06/27/2020 1354   MCV 84.3 05/07/2011 1356   MCH 27.9 09/26/2023 0920   MCHC 32.6 09/26/2023 0920   RDW 13.2 09/26/2023 0920   RDW 13.1 06/27/2020 1354   RDW 12.9 05/07/2011 1356   LYMPHSABS PENDING 09/26/2023 0920   LYMPHSABS 4.4 (H) 06/27/2020 1354   LYMPHSABS 5.1 (H) 05/07/2011 1356   MONOABS PENDING 09/26/2023 0920   MONOABS 0.5 05/07/2011 1356   EOSABS PENDING 09/26/2023 0920   EOSABS 0.1 06/27/2020 1354   BASOSABS PENDING 09/26/2023 0920   BASOSABS 0.0 06/27/2020 1354   BASOSABS 0.1 05/07/2011 1356    CMP     Component Value Date/Time   NA 139 09/19/2023 1057   NA 136 06/27/2020 1354   K 4.0 09/19/2023 1057   CL 107 09/19/2023 1057   CO2 20 (L) 09/19/2023 1057   GLUCOSE 97 09/19/2023 1057   BUN 24 (H) 09/19/2023 1057  BUN 15 06/27/2020 1354   CREATININE 1.51 (H) 09/19/2023 1057   CALCIUM  9.1 09/19/2023 1057   PROT 7.2 09/19/2023 1057   PROT 7.5 01/31/2021 1027   ALBUMIN 3.8 09/19/2023 1057   ALBUMIN 4.3 01/31/2021 1027   AST 15 09/19/2023 1057   ALT 8 09/19/2023 1057   ALKPHOS 58 09/19/2023 1057   BILITOT 0.6 09/19/2023 1057   GFRNONAA 36 (L) 09/19/2023 1057     ASSESSMENT and THERAPY PLAN:   Triple negative breast cancer Initiated chemotherapy with Taxotere  and Cytoxan . Monitoring closely for tolerance.  Discussed potential side effects and advised on when to contact the clinic. Informed about driving restrictions post-treatment.  Assessment and Plan Assessment & Plan Chemotherapy for cancer Completed first chemotherapy cycle with no side effects. Discussed potential hair loss, reversible in 99% of cases. - Administer second cycle of chemotherapy on July 2nd.  Hypertension Hypertension poorly controlled. Amlodipine  at maximum dose, labetalol  ineffective. Hydralazine  dose can be increased. - Increase hydralazine  to 75 mg three times a day by taking one 50 mg tablet and one 25 mg tablet each time. - Send prescription for 25 mg hydralazine  to [facility]. - Monitor blood pressure regularly.   RTC as scheduled.   All questions were answered. The patient knows to call the clinic with any problems, questions or concerns. We can certainly see the patient much sooner if necessary.  Total encounter time:30 minutes*in face-to-face visit time, chart review, lab review, care coordination, order entry, and documentation of the encounter time.  *Total Encounter Time as defined by the Centers for Medicare and Medicaid Services includes, in addition to the face-to-face time of a patient visit (documented in the note above) non-face-to-face time: obtaining and reviewing outside history, ordering and reviewing medications, tests or procedures, care coordination (communications with other health care professionals or caregivers) and documentation in the medical record.

## 2023-09-27 ENCOUNTER — Ambulatory Visit: Admitting: Internal Medicine

## 2023-09-29 ENCOUNTER — Encounter: Payer: Self-pay | Admitting: Internal Medicine
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
## 2023-09-30 ENCOUNTER — Ambulatory Visit (INDEPENDENT_AMBULATORY_CARE_PROVIDER_SITE_OTHER): Admitting: Internal Medicine

## 2023-09-30 VITALS — BP 136/60 | HR 61 | Temp 98.2°F | Ht 64.0 in | Wt 158.0 lb

## 2023-09-30 DIAGNOSIS — I10 Essential (primary) hypertension: Secondary | ICD-10-CM

## 2023-09-30 DIAGNOSIS — L989 Disorder of the skin and subcutaneous tissue, unspecified: Secondary | ICD-10-CM | POA: Diagnosis not present

## 2023-09-30 NOTE — Assessment & Plan Note (Signed)
 Chronic Blood pressure not ideally controlled Hydralazine  was increased to 75 mg 3 times daily by oncology, but she is still taking 50 mg 3 times daily Advised hydralazine  75 mg - one 50 mg pill and one 25 mg p.o. 3 times a day Continue amlodipine  10 mg daily, labetalol  300 mg twice daily
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
## 2023-09-30 NOTE — Patient Instructions (Addendum)
       Medications changes include :   take hydralazine  75 mg three times a day      Return for follow up as scheduled.

## 2023-10-03 ENCOUNTER — Encounter: Payer: Self-pay | Admitting: Emergency Medicine

## 2023-10-03 ENCOUNTER — Ambulatory Visit
Admission: EM | Admit: 2023-10-03 | Discharge: 2023-10-03 | Disposition: A | Discharge status: Home / Self Care | Attending: Nurse Practitioner | Admitting: Nurse Practitioner

## 2023-10-03 ENCOUNTER — Ambulatory Visit (HOSPITAL_COMMUNITY)
Admission: RE | Admit: 2023-10-03 | Discharge: 2023-10-03 | Disposition: A | Discharge status: Home / Self Care | Source: Ambulatory Visit | Attending: Nurse Practitioner | Admitting: Nurse Practitioner

## 2023-10-03 DIAGNOSIS — M25572 Pain in left ankle and joints of left foot: Secondary | ICD-10-CM | POA: Insufficient documentation

## 2023-10-03 DIAGNOSIS — M7989 Other specified soft tissue disorders: Secondary | ICD-10-CM | POA: Insufficient documentation

## 2023-10-03 DIAGNOSIS — M7662 Achilles tendinitis, left leg: Secondary | ICD-10-CM | POA: Diagnosis not present

## 2023-10-03 NOTE — Discharge Instructions (Addendum)
 Go to [facility] for an xray of your left ankle. Go to the main entrance of the hospital to go the Radiology department. You will be contacted when the results of the x-ray are received. Take the tramadol  that you currently have for severe pain or discomfort. You have been provided an ankle brace to provide additional compression and support.  Wear the brace when you are engaged in prolonged or strenuous activity. RICE therapy, rest, ice, compression, and elevation.  Apply ice for 20 minutes, remove for 1 hour, repeat is much as possible to help with pain or swelling. Make sure you are wearing shoes with good insole and support while you are working. As discussed, if symptoms do not improve over the next 1 to 2 weeks, or appear to be worsening, it is recommended that you follow-up with your primary care physician for further evaluation. Follow-up as needed.
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
## 2023-10-06 ENCOUNTER — Other Ambulatory Visit: Payer: Self-pay | Admitting: Internal Medicine

## 2023-10-09 ENCOUNTER — Inpatient Hospital Stay: Attending: Hematology and Oncology

## 2023-10-09 ENCOUNTER — Telehealth: Payer: Self-pay | Admitting: Physician Assistant

## 2023-10-09 ENCOUNTER — Inpatient Hospital Stay (HOSPITAL_BASED_OUTPATIENT_CLINIC_OR_DEPARTMENT_OTHER): Admitting: Physician Assistant

## 2023-10-09 VITALS — BP 157/61 | HR 58 | Temp 97.2°F | Resp 18 | Wt 161.2 lb

## 2023-10-09 DIAGNOSIS — Z171 Estrogen receptor negative status [ER-]: Secondary | ICD-10-CM

## 2023-10-09 DIAGNOSIS — D6481 Anemia due to antineoplastic chemotherapy: Secondary | ICD-10-CM | POA: Diagnosis not present

## 2023-10-09 DIAGNOSIS — Z17421 Hormone receptor negative with human epidermal growth factor receptor 2 negative status: Secondary | ICD-10-CM | POA: Diagnosis not present

## 2023-10-09 DIAGNOSIS — C50312 Malignant neoplasm of lower-inner quadrant of left female breast: Secondary | ICD-10-CM | POA: Diagnosis not present

## 2023-10-09 DIAGNOSIS — D649 Anemia, unspecified: Secondary | ICD-10-CM | POA: Diagnosis not present

## 2023-10-09 DIAGNOSIS — Z5111 Encounter for antineoplastic chemotherapy: Secondary | ICD-10-CM | POA: Insufficient documentation

## 2023-10-09 LAB — CBC WITH DIFFERENTIAL (CANCER CENTER ONLY)
Abs Immature Granulocytes: 0.02 10*3/uL (ref 0.00–0.07)
Basophils Absolute: 0.1 10*3/uL (ref 0.0–0.1)
Basophils Relative: 1 %
Eosinophils Absolute: 0 10*3/uL (ref 0.0–0.5)
Eosinophils Relative: 0 %
HCT: 29 % — ABNORMAL LOW (ref 36.0–46.0)
Hemoglobin: 9.4 g/dL — ABNORMAL LOW (ref 12.0–15.0)
Immature Granulocytes: 0 %
Lymphocytes Relative: 25 %
Lymphs Abs: 1.9 10*3/uL (ref 0.7–4.0)
MCH: 27.9 pg (ref 26.0–34.0)
MCHC: 32.4 g/dL (ref 30.0–36.0)
MCV: 86.1 fL (ref 80.0–100.0)
Monocytes Absolute: 0.4 10*3/uL (ref 0.1–1.0)
Monocytes Relative: 6 %
Neutro Abs: 5.2 10*3/uL (ref 1.7–7.7)
Neutrophils Relative %: 68 %
Platelet Count: 526 10*3/uL — ABNORMAL HIGH (ref 150–400)
RBC: 3.37 MIL/uL — ABNORMAL LOW (ref 3.87–5.11)
RDW: 13.7 % (ref 11.5–15.5)
WBC Count: 7.6 10*3/uL (ref 4.0–10.5)
nRBC: 0 % (ref 0.0–0.2)

## 2023-10-09 LAB — CMP (CANCER CENTER ONLY)
ALT: 8 U/L (ref 0–44)
AST: 8 U/L — ABNORMAL LOW (ref 15–41)
Albumin: 3.5 g/dL (ref 3.5–5.0)
Alkaline Phosphatase: 58 U/L (ref 38–126)
Anion gap: 8 (ref 5–15)
BUN: 20 mg/dL (ref 8–23)
CO2: 23 mmol/L (ref 22–32)
Calcium: 9 mg/dL (ref 8.9–10.3)
Chloride: 111 mmol/L (ref 98–111)
Creatinine: 1.54 mg/dL — ABNORMAL HIGH (ref 0.44–1.00)
GFR, Estimated: 35 mL/min — ABNORMAL LOW (ref >60)
Glucose, Bld: 100 mg/dL — ABNORMAL HIGH (ref 70–99)
Potassium: 4.3 mmol/L (ref 3.5–5.1)
Sodium: 142 mmol/L (ref 135–145)
Total Bilirubin: 0.4 mg/dL (ref 0.0–1.2)
Total Protein: 6.8 g/dL (ref 6.5–8.1)

## 2023-10-09 NOTE — Progress Notes (Signed)
 [facility] Cancer Follow up:    Kimberly Glade PARAS, MD [address] [place] [address]   DIAGNOSIS:  Cancer Staging  Malignant neoplasm of lower-inner quadrant of left breast in female, estrogen receptor negative (HCC) Staging form: Breast, AJCC 8th Edition - Clinical stage from 08/28/2023: Stage IIB (cT2, cN0, cM0, G3, ER-, PR-, HER2-) - Signed by Loretha Ash, MD on 08/28/2023 Stage prefix: Initial diagnosis Histologic grading system: 3 grade system Laterality: Left Staged by: Pathologist and managing physician Stage used in treatment planning: Yes National guidelines used in treatment planning: Yes Type of national guideline used in treatment planning: NCCN  SUMMARY OF ONCOLOGIC HISTORY: Oncology History  Malignant neoplasm of lower-inner quadrant of left breast in female, estrogen receptor negative (HCC)  08/15/2023 Mammogram   Highly suspicious mass with associated calcifications in the left breast at 8 o'clock measuring 2.8 cm. The calcifications extend slightly outside of the mass by 0.5 cm or less. Resolution of the previously seen mass in the right breast at 12o'clock. No mammographic evidence of malignancy in the right breast.     08/20/2023 Pathology Results   Left breast needle core biopsy showed grade 3 IDC, triple negative.   08/26/2023 Initial Diagnosis   Malignant neoplasm of lower-inner quadrant of left breast in female, estrogen receptor negative (HCC)   08/28/2023 Cancer Staging   Staging form: Breast, AJCC 8th Edition - Clinical stage from 08/28/2023: Stage IIB (cT2, cN0, cM0, G3, ER-, PR-, HER2-) - Signed by Loretha Ash, MD on 08/28/2023 Stage prefix: Initial diagnosis Histologic grading system: 3 grade system Laterality: Left Staged by: Pathologist and managing physician Stage used in treatment planning: Yes National guidelines used in treatment planning: Yes Type of national guideline used in treatment planning: NCCN   09/19/2023 -   Chemotherapy   Patient is on Treatment Plan : BREAST TC q21d      Genetic Testing   [facility] CancerNext Panel+RNA was Negative. Report date is 09/08/2023.   The [facility] CancerNext+RNAinsight Panel includes sequencing, rearrangement analysis, and RNA analysis for the following 40 genes: APC, ATM, BAP1, BARD1, BMPR1A, BRCA1, BRCA2, BRIP1, CDH1, CDKN2A, CHEK2, FH, FLCN, MET, MLH1, MSH2, MSH6, MUTYH, NF1, NTHL1, PALB2, PMS2, PTEN, RAD51C, RAD51D, RPS20, SMAD4, STK11, TP53, TSC1, TSC2, and VHL (sequencing and deletion/duplication); AXIN2, HOXB13, MBD4, MSH3, POLD1 and POLE (sequencing only); EPCAM and GREM1 (deletion/duplication only).      CURRENT THERAPY: Taxotere /Cytoxan   INTERVAL HISTORY: [name] is a 78 year old female with triple negative breast cancer who presents for a toxicity evaluation prior to Cycle 2, Day 1 of TC chemotherapy. She is unaccompanied for this visit.    [name] reports she tolerated chemotherapy without any significant toxicities. Her energy levels are unchanged and she continues to work full-time.  She has a good appetite without any noticeable weight loss.  She denies nausea, vomiting or abdominal pain.  She did experience some loose stools but contributes this to increased fiber intake with fruits.  She has noticed hair loss since starting chemotherapy.  She denies easy bruising or signs of active bleeding.  She denies fevers, chills, night sweats, shortness of breath, chest pain or cough.  She has no other complaints.  Patient Active Problem List   Diagnosis Date Noted   Skin abnormality 09/30/2023   Genetic testing 09/11/2023   Malignant neoplasm of lower-inner quadrant of left breast in female, estrogen receptor negative (HCC) 08/26/2023   CKD (chronic kidney disease) stage 3, GFR 30-59 ml/min (HCC) 06/16/2023  Muscle cramping 07/09/2022   Weight loss 05/04/2022   Subacute cough 05/04/2022   Breast lump on right side at 11 o'clock position 12/05/2021   Vitamin  D deficiency 12/04/2021   Low vitamin B12 level 12/04/2021   Clavicle enlargement 07/23/2018   Osteopenia 06/14/2018   Achilles tendinitis 01/29/2017   Bilateral carotid artery occlusion 01/29/2017   Neuropathy at site of breast surgery 01/28/2017   Prediabetes 07/24/2015   GERD (gastroesophageal reflux disease) 07/20/2015   Vitiligo 07/07/2012   PLANTAR FASCIITIS 06/16/2010   Tobacco dependence due to cigarettes 06/14/2009   Hyperlipidemia 11/10/2008   Angioedema 01/09/2008   Hypertension 06/12/2007   Personal history of malignant neoplasm of breast 02/25/2007   RHEUMATIC FEVER, HX OF 02/21/2007    is allergic to benazepril hcl and yellow dyes (non-tartrazine).  MEDICAL HISTORY: Past Medical History:  Diagnosis Date   Absence of menstruation    amenorrhea   Angioneurotic edema not elsewhere classified    due to ACE-I   Breast cancer (HCC)    Breast disorder    breast cancer 2007   Cancer [facility]) 2007   Left Breast Cancer   Carotid stenosis    Carotid US  (8/15):  RICA 40-59%; LICA 40-59% >>> FU 1 year   Chronic kidney disease    CKD3   GERD (gastroesophageal reflux disease)    H/O: rheumatic fever    History of breast cancer 07/13/2015   HLD (hyperlipidemia)    Occlusion and stenosis of carotid artery without mention of cerebral infarction    Other abnormal glucose    fasting hyperglycemia   Peripheral vascular disease (HCC)    Carotid Stenosis   Personal history of malignant neoplasm of breast    Personal history of radiation therapy 2007   Pneumonia    Pre-diabetes    Unspecified essential hypertension     SURGICAL HISTORY: Past Surgical History:  Procedure Laterality Date   APPENDECTOMY  1972   In [place]    BREAST BIOPSY Left 12/06/2005   malignant   BREAST BIOPSY Left 08/20/2023   US  LT BREAST BX W LOC DEV 1ST LESION IMG BX SPEC US  GUIDE 08/20/2023 [facility] MAMMOGRAPHY   BREAST LUMPECTOMY Left 01/08/2006   COLONOSCOPY  2005   negative; Dr Kristie    COLONOSCOPY N/A 09/02/2015   Procedure: COLONOSCOPY;  Surgeon: Margo LITTIE Haddock, MD;  Location: [facility] SUITE;  Service: Endoscopy;  Laterality: N/A;  1215-moved to 1130 Ginger to notify pt   PORTACATH PLACEMENT N/A 09/18/2023   Procedure: INSERTION, TUNNELED CENTRAL VENOUS DEVICE, WITH PORT;  Surgeon: Vanderbilt Ned, MD;  Location: [facility] OR;  Service: General;  Laterality: N/A;    SOCIAL HISTORY: Social History   Socioeconomic History   Marital status: Single    Spouse name: Not on file   Number of children: Not on file   Years of education: Not on file   Highest education level: Not on file  Occupational History   Not on file  Tobacco Use   Smoking status: Every Day    Current packs/day: 0.50    Average packs/day: 0.5 packs/day for 48.0 years (24.0 ttl pk-yrs)    Types: Cigarettes    Passive exposure: Current   Smokeless tobacco: Never   Tobacco comments:    Smokes daily.    Vaping Use   Vaping status: Never Used  Substance and Sexual Activity   Alcohol use: Yes    Alcohol/week: 1.0 standard drink of alcohol    Types: 1 Standard drinks or equivalent  per week    Comment: occasionally   Drug use: No   Sexual activity: Yes    Birth control/protection: Post-menopausal  Other Topics Concern   Not on file  Social History Narrative   Single, no children.   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    Social Drivers of Health   Financial Resource Strain: Medium Risk (05/31/2023)   Overall Financial Resource Strain (CARDIA)    Difficulty of Paying Living Expenses: Somewhat hard  Food Insecurity: No Food Insecurity (08/28/2023)   Hunger Vital Sign    Worried About Running Out of Food in the Last Year: Never true    Ran Out of Food in the Last Year: Never true  Transportation Needs: No Transportation Needs (08/28/2023)   PRAPARE - Administrator, Civil Service (Medical): No    Lack of Transportation (Non-Medical): No  Physical Activity: Sufficiently Active  (05/31/2023)   Exercise Vital Sign    Days of Exercise per Week: 5 days    Minutes of Exercise per Session: 60 min  Stress: No Stress Concern Present (05/31/2023)   [employer] of Occupational Health - Occupational Stress Questionnaire    Feeling of Stress : Not at all  Social Connections: Moderately Isolated (05/31/2023)   Social Connection and Isolation Panel    Frequency of Communication with Friends and Family: More than three times a week    Frequency of Social Gatherings with Friends and Family: More than three times a week    Attends Religious Services: 1 to 4 times per year    Active Member of [employer] or Organizations: No    Attends Banker Meetings: Never    Marital Status: Never married  Intimate Partner Violence: Not At Risk (08/28/2023)   Humiliation, Afraid, Rape, and Kick questionnaire    Fear of Current or Ex-Partner: No    Emotionally Abused: No    Physically Abused: No    Sexually Abused: No    FAMILY HISTORY: Family History  Problem Relation Age of Onset   Breast cancer Sister 86   Hypertension Other        some of Pts brothers and sisters   Heart attack Neg Hx    Stroke Neg Hx    Diabetes Neg Hx    Heart disease Neg Hx     Review of Systems  Constitutional:  Negative for appetite change, chills, fatigue, fever and unexpected weight change.  HENT:   Negative for hearing loss, lump/mass and trouble swallowing.   Eyes:  Negative for eye problems and icterus.  Respiratory:  Negative for chest tightness, cough and shortness of breath.   Cardiovascular:  Negative for chest pain, leg swelling and palpitations.  Gastrointestinal:  Negative for abdominal distention, abdominal pain, constipation, diarrhea, nausea and vomiting.  Endocrine: Negative for hot flashes.  Genitourinary:  Negative for difficulty urinating.   Musculoskeletal:  Negative for arthralgias.  Skin:  Negative for itching and rash.  Neurological:  Negative for dizziness, extremity  weakness, headaches and numbness.  Hematological:  Negative for adenopathy. Does not bruise/bleed easily.  Psychiatric/Behavioral:  Negative for depression. The patient is not nervous/anxious.       PHYSICAL EXAMINATION    Vitals:   10/09/23 0838  BP: (!) 157/61  Pulse: (!) 58  Resp: 18  Temp: (!) 97.2 F (36.2 C)  SpO2: 98%    Physical Exam Constitutional:      General: She is not in acute distress.    Appearance:  Normal appearance. She is not toxic-appearing.  HENT:     Head: Normocephalic and atraumatic.  Eyes:     General: No scleral icterus. Cardiovascular:     Rate and Rhythm: Normal rate and regular rhythm.     Pulses: Normal pulses.  Pulmonary:     Effort: Pulmonary effort is normal.     Breath sounds: Normal breath sounds.  Abdominal:     General: Abdomen is flat. There is no distension.  Musculoskeletal:        General: No swelling.  Lymphadenopathy:     Cervical: No cervical adenopathy.  Skin:    General: Skin is warm and dry.     Findings: No rash.  Neurological:     General: No focal deficit present.     Mental Status: She is alert.  Psychiatric:        Mood and Affect: Mood normal.        Behavior: Behavior normal.     LABORATORY DATA:  CBC    Component Value Date/Time   WBC 7.6 10/09/2023 0737   WBC 9.4 05/04/2022 1355   RBC 3.37 (L) 10/09/2023 0737   HGB 9.4 (L) 10/09/2023 0737   HGB 13.3 06/27/2020 1354   HGB 12.4 05/07/2011 1356   HCT 29.0 (L) 10/09/2023 0737   HCT 40.3 06/27/2020 1354   HCT 37.8 05/07/2011 1356   PLT 526 (H) 10/09/2023 0737   PLT 287 06/27/2020 1354   MCV 86.1 10/09/2023 0737   MCV 85 06/27/2020 1354   MCV 84.3 05/07/2011 1356   MCH 27.9 10/09/2023 0737   MCHC 32.4 10/09/2023 0737   RDW 13.7 10/09/2023 0737   RDW 13.1 06/27/2020 1354   RDW 12.9 05/07/2011 1356   LYMPHSABS 1.9 10/09/2023 0737   LYMPHSABS 4.4 (H) 06/27/2020 1354   LYMPHSABS 5.1 (H) 05/07/2011 1356   MONOABS 0.4 10/09/2023 0737   MONOABS  0.5 05/07/2011 1356   EOSABS 0.0 10/09/2023 0737   EOSABS 0.1 06/27/2020 1354   BASOSABS 0.1 10/09/2023 0737   BASOSABS 0.0 06/27/2020 1354   BASOSABS 0.1 05/07/2011 1356    CMP     Component Value Date/Time   NA 142 10/09/2023 0737   NA 136 06/27/2020 1354   K 4.3 10/09/2023 0737   CL 111 10/09/2023 0737   CO2 23 10/09/2023 0737   GLUCOSE 100 (H) 10/09/2023 0737   BUN 20 10/09/2023 0737   BUN 15 06/27/2020 1354   CREATININE 1.54 (H) 10/09/2023 0737   CALCIUM  9.0 10/09/2023 0737   PROT 6.8 10/09/2023 0737   PROT 7.5 01/31/2021 1027   ALBUMIN 3.5 10/09/2023 0737   ALBUMIN 4.3 01/31/2021 1027   AST 8 (L) 10/09/2023 0737   ALT 8 10/09/2023 0737   ALKPHOS 58 10/09/2023 0737   BILITOT 0.4 10/09/2023 0737   GFRNONAA 35 (L) 10/09/2023 0737     ASSESSMENT and THERAPY PLAN:  [name] is a 78 y.o. female who presents to the clinic for evaluation for triple negative breast cancer.  #Triple negative breast cancer: --Treatment plan includes neoadjuvant TC chemotherapy followed by mastectomy due to prior radiation therapy.  --Initiated chemotherapy with Taxotere  and Cytoxan  on 09/19/2023 with GCSF injection on Day 3.  PLAN: --Due for cycle 2, day 1 tomorrow.  --Labs from today were reviewed and adequate for treatment. WBC 7.6, Hgb 9.4, Plt 526K, Creatinine stable at 1.54, LFTs in range.  --No prohibitive toxicities identified.  --Proceed with treatment as planned.  --RTC in 3 weeks with labs  and follow up prior to Cycle 3, Day 1.   #Hypertension: --Currently on hydralazine  to 75 mg three times a day by taking one 50 mg tablet and one 25 mg tablet each time. --Monitor blood pressure regularly.  #Anemia: --Hgb is 9.4 today. Likely secondary to chemotherapy.  --Patient has history of B12 deficiency so will check B12 levels along with iron panel tomorrow.    All questions were answered. The patient knows to call the clinic with any problems, questions or concerns. We can  certainly see the patient much sooner if necessary.   I have spent a total of 30 minutes minutes of face-to-face and non-face-to-face time, preparing to see the patient, performing a medically appropriate examination, counseling and educating the patient, ordering medications/tests/procedures, documenting clinical information in the electronic health record, independently interpreting results and communicating results to the patient, and care coordination.   Johnston Police PA-C Dept of Hematology and Oncology [facility] at [facility] Phone: [phone]

## 2023-10-09 NOTE — Telephone Encounter (Signed)
 Attempted to contact the patient to inform her of changes made to her appointments for tomorrow. Was unable to leave a voicemail.

## 2023-10-10 ENCOUNTER — Encounter: Payer: Self-pay | Admitting: Hematology and Oncology

## 2023-10-10 ENCOUNTER — Inpatient Hospital Stay

## 2023-10-10 VITALS — BP 148/62 | HR 58 | Temp 98.2°F | Resp 18

## 2023-10-10 DIAGNOSIS — Z5111 Encounter for antineoplastic chemotherapy: Secondary | ICD-10-CM | POA: Diagnosis not present

## 2023-10-10 DIAGNOSIS — Z171 Estrogen receptor negative status [ER-]: Secondary | ICD-10-CM

## 2023-10-10 DIAGNOSIS — D649 Anemia, unspecified: Secondary | ICD-10-CM

## 2023-10-10 DIAGNOSIS — Z803 Family history of malignant neoplasm of breast: Secondary | ICD-10-CM

## 2023-10-10 LAB — VITAMIN B12: Vitamin B-12: 1186 pg/mL — ABNORMAL HIGH (ref 180–914)

## 2023-10-10 LAB — FERRITIN: Ferritin: 173 ng/mL (ref 11–307)

## 2023-10-10 LAB — IRON AND IRON BINDING CAPACITY (CC-WL,HP ONLY)
Iron: 33 ug/dL (ref 28–170)
Saturation Ratios: 14 % (ref 10.4–31.8)
TIBC: 238 ug/dL — ABNORMAL LOW (ref 250–450)
UIBC: 205 ug/dL (ref 148–442)

## 2023-10-10 LAB — FOLATE: Folate: 10.4 ng/mL (ref >5.9)

## 2023-10-10 MED ORDER — SODIUM CHLORIDE 0.9 % IV SOLN: INTRAVENOUS | Status: DC

## 2023-10-10 MED ORDER — DEXAMETHASONE SODIUM PHOSPHATE 10 MG/ML IJ SOLN
10.0000 mg | Freq: Once | INTRAMUSCULAR | Status: AC
  Administered 2023-10-10: 10 mg via INTRAVENOUS
  Filled 2023-10-10: qty 1

## 2023-10-10 MED ORDER — PALONOSETRON HCL INJECTION 0.25 MG/5ML
0.2500 mg | Freq: Once | INTRAVENOUS | Status: AC
  Administered 2023-10-10: 0.25 mg via INTRAVENOUS
  Filled 2023-10-10: qty 5

## 2023-10-10 MED ORDER — SODIUM CHLORIDE 0.9 % IV SOLN
600.0000 mg/m2 | Freq: Once | INTRAVENOUS | Status: AC
  Administered 2023-10-10: 1000 mg via INTRAVENOUS
  Filled 2023-10-10: qty 50

## 2023-10-10 MED ORDER — SODIUM CHLORIDE 0.9 % IV SOLN
75.0000 mg/m2 | Freq: Once | INTRAVENOUS | Status: AC
  Administered 2023-10-10: 137 mg via INTRAVENOUS
  Filled 2023-10-10: qty 13.7

## 2023-10-10 NOTE — Patient Instructions (Signed)
 [facility] - A DEPT OF [facility]  Discharge Instructions: Thank you for choosing [facility] to provide your oncology and hematology care.   If you have a lab appointment with the Cancer Center, please go directly to the Cancer Center and check in at the registration area.   Wear comfortable clothing and clothing appropriate for easy access to any Portacath or PICC line.   We strive to give you quality time with your provider. You may need to reschedule your appointment if you arrive late (15 or more minutes).  Arriving late affects you and other patients whose appointments are after yours.  Also, if you miss three or more appointments without notifying the office, you may be dismissed from the clinic at the provider's discretion.      For prescription refill requests, have your pharmacy contact our office and allow 72 hours for refills to be completed.    Today you received the following chemotherapy and/or immunotherapy agents docetaxel and cytoxan      To help prevent nausea and vomiting after your treatment, we encourage you to take your nausea medication as directed.  BELOW ARE SYMPTOMS THAT SHOULD BE REPORTED IMMEDIATELY: *FEVER GREATER THAN 100.4 F (38 C) OR HIGHER *CHILLS OR SWEATING *NAUSEA AND VOMITING THAT IS NOT CONTROLLED WITH YOUR NAUSEA MEDICATION *UNUSUAL SHORTNESS OF BREATH *UNUSUAL BRUISING OR BLEEDING *URINARY PROBLEMS (pain or burning when urinating, or frequent urination) *BOWEL PROBLEMS (unusual diarrhea, constipation, pain near the anus) TENDERNESS IN MOUTH AND THROAT WITH OR WITHOUT PRESENCE OF ULCERS (sore throat, sores in mouth, or a toothache) UNUSUAL RASH, SWELLING OR PAIN  UNUSUAL VAGINAL DISCHARGE OR ITCHING   Items with * indicate a potential emergency and should be followed up as soon as possible or go to the Emergency Department if any problems should occur.  Please show the CHEMOTHERAPY ALERT CARD or  IMMUNOTHERAPY ALERT CARD at check-in to the Emergency Department and triage nurse.  Should you have questions after your visit or need to cancel or reschedule your appointment, please contact [facility] - A DEPT OF [facility]  Dept: [phone]  and follow the prompts.  Office hours are 8:00 a.m. to 4:30 p.m. Monday - Friday. Please note that voicemails left after 4:00 p.m. may not be returned until the following business day.  We are closed weekends and major holidays. You have access to a nurse at all times for urgent questions. Please call the main number to the clinic Dept: [phone] and follow the prompts.   For any non-urgent questions, you may also contact your provider using MyChart. We now offer e-Visits for anyone 31 and older to request care online for non-urgent symptoms. For details visit [email or web address].   Also download the MyChart app! Go to the app store, search "MyChart", open the app, select [place], and log in with your MyChart username and password.

## 2023-10-12 ENCOUNTER — Inpatient Hospital Stay

## 2023-10-12 VITALS — BP 157/62 | HR 72 | Temp 97.2°F | Resp 17

## 2023-10-12 DIAGNOSIS — Z5111 Encounter for antineoplastic chemotherapy: Secondary | ICD-10-CM | POA: Diagnosis not present

## 2023-10-12 DIAGNOSIS — Z171 Estrogen receptor negative status [ER-]: Secondary | ICD-10-CM

## 2023-10-12 MED ORDER — PEGFILGRASTIM-JMDB 6 MG/0.6ML ~~LOC~~ SOSY
6.0000 mg | PREFILLED_SYRINGE | Freq: Once | SUBCUTANEOUS | Status: AC
  Administered 2023-10-12: 6 mg via SUBCUTANEOUS
  Filled 2023-10-12: qty 0.6

## 2023-10-14 ENCOUNTER — Ambulatory Visit: Payer: Self-pay | Admitting: Physician Assistant

## 2023-10-18 ENCOUNTER — Other Ambulatory Visit: Payer: Self-pay | Admitting: Hematology and Oncology

## 2023-10-20 DIAGNOSIS — D84821 Immunodeficiency due to drugs: Secondary | ICD-10-CM | POA: Diagnosis not present

## 2023-10-20 DIAGNOSIS — Z87892 Personal history of anaphylaxis: Secondary | ICD-10-CM | POA: Diagnosis not present

## 2023-10-20 DIAGNOSIS — Z9102 Food additives allergy status: Secondary | ICD-10-CM | POA: Diagnosis not present

## 2023-10-20 DIAGNOSIS — I10 Essential (primary) hypertension: Secondary | ICD-10-CM | POA: Diagnosis not present

## 2023-10-20 DIAGNOSIS — T451X5D Adverse effect of antineoplastic and immunosuppressive drugs, subsequent encounter: Secondary | ICD-10-CM | POA: Diagnosis not present

## 2023-10-20 DIAGNOSIS — I739 Peripheral vascular disease, unspecified: Secondary | ICD-10-CM | POA: Diagnosis not present

## 2023-10-20 DIAGNOSIS — C50919 Malignant neoplasm of unspecified site of unspecified female breast: Secondary | ICD-10-CM | POA: Diagnosis not present

## 2023-10-20 DIAGNOSIS — Z008 Encounter for other general examination: Secondary | ICD-10-CM | POA: Diagnosis not present

## 2023-10-20 DIAGNOSIS — Z8249 Family history of ischemic heart disease and other diseases of the circulatory system: Secondary | ICD-10-CM | POA: Diagnosis not present

## 2023-10-20 DIAGNOSIS — E785 Hyperlipidemia, unspecified: Secondary | ICD-10-CM | POA: Diagnosis not present

## 2023-10-20 DIAGNOSIS — F17210 Nicotine dependence, cigarettes, uncomplicated: Secondary | ICD-10-CM | POA: Diagnosis not present

## 2023-10-20 DIAGNOSIS — K219 Gastro-esophageal reflux disease without esophagitis: Secondary | ICD-10-CM | POA: Diagnosis not present

## 2023-10-24 ENCOUNTER — Encounter: Payer: Self-pay | Admitting: Hematology and Oncology

## 2023-10-31 ENCOUNTER — Encounter: Payer: Self-pay | Admitting: Adult Health

## 2023-10-31 ENCOUNTER — Other Ambulatory Visit: Payer: Self-pay | Admitting: Hematology and Oncology

## 2023-10-31 ENCOUNTER — Telehealth: Payer: Self-pay | Admitting: Internal Medicine

## 2023-10-31 ENCOUNTER — Inpatient Hospital Stay

## 2023-10-31 ENCOUNTER — Inpatient Hospital Stay: Admitting: Adult Health

## 2023-10-31 VITALS — BP 167/63 | HR 58 | Temp 97.4°F | Resp 18 | Ht 64.0 in | Wt 161.4 lb

## 2023-10-31 DIAGNOSIS — Z171 Estrogen receptor negative status [ER-]: Secondary | ICD-10-CM

## 2023-10-31 DIAGNOSIS — C50312 Malignant neoplasm of lower-inner quadrant of left female breast: Secondary | ICD-10-CM

## 2023-10-31 DIAGNOSIS — Z95828 Presence of other vascular implants and grafts: Secondary | ICD-10-CM | POA: Insufficient documentation

## 2023-10-31 DIAGNOSIS — Z5111 Encounter for antineoplastic chemotherapy: Secondary | ICD-10-CM | POA: Diagnosis not present

## 2023-10-31 LAB — CBC WITH DIFFERENTIAL (CANCER CENTER ONLY)
Abs Immature Granulocytes: 0.02 K/uL (ref 0.00–0.07)
Basophils Absolute: 0 K/uL (ref 0.0–0.1)
Basophils Relative: 1 %
Eosinophils Absolute: 0 K/uL (ref 0.0–0.5)
Eosinophils Relative: 0 %
HCT: 26 % — ABNORMAL LOW (ref 36.0–46.0)
Hemoglobin: 8.2 g/dL — ABNORMAL LOW (ref 12.0–15.0)
Immature Granulocytes: 0 %
Lymphocytes Relative: 25 %
Lymphs Abs: 1.4 K/uL (ref 0.7–4.0)
MCH: 27.7 pg (ref 26.0–34.0)
MCHC: 31.5 g/dL (ref 30.0–36.0)
MCV: 87.8 fL (ref 80.0–100.0)
Monocytes Absolute: 0.4 K/uL (ref 0.1–1.0)
Monocytes Relative: 6 %
Neutro Abs: 3.8 K/uL (ref 1.7–7.7)
Neutrophils Relative %: 68 %
Platelet Count: 375 K/uL (ref 150–400)
RBC: 2.96 MIL/uL — ABNORMAL LOW (ref 3.87–5.11)
RDW: 15.9 % — ABNORMAL HIGH (ref 11.5–15.5)
WBC Count: 5.6 K/uL (ref 4.0–10.5)
nRBC: 0 % (ref 0.0–0.2)

## 2023-10-31 LAB — CMP (CANCER CENTER ONLY)
ALT: 13 U/L (ref 0–44)
AST: 15 U/L (ref 15–41)
Albumin: 3.5 g/dL (ref 3.5–5.0)
Alkaline Phosphatase: 66 U/L (ref 38–126)
Anion gap: 6 (ref 5–15)
BUN: 21 mg/dL (ref 8–23)
CO2: 26 mmol/L (ref 22–32)
Calcium: 8.8 mg/dL — ABNORMAL LOW (ref 8.9–10.3)
Chloride: 109 mmol/L (ref 98–111)
Creatinine: 1.49 mg/dL — ABNORMAL HIGH (ref 0.44–1.00)
GFR, Estimated: 36 mL/min — ABNORMAL LOW (ref >60)
Glucose, Bld: 92 mg/dL (ref 70–99)
Potassium: 4.2 mmol/L (ref 3.5–5.1)
Sodium: 141 mmol/L (ref 135–145)
Total Bilirubin: 0.5 mg/dL (ref 0.0–1.2)
Total Protein: 6.7 g/dL (ref 6.5–8.1)

## 2023-10-31 MED ORDER — SODIUM CHLORIDE 0.9 % IV SOLN
60.0000 mg/m2 | Freq: Once | INTRAVENOUS | Status: AC
  Administered 2023-10-31: 109 mg via INTRAVENOUS
  Filled 2023-10-31: qty 10.9

## 2023-10-31 MED ORDER — SODIUM CHLORIDE 0.9% FLUSH
10.0000 mL | Freq: Once | INTRAVENOUS | Status: AC
Start: 2023-10-31 — End: 2023-10-31
  Administered 2023-10-31: 10 mL

## 2023-10-31 MED ORDER — SODIUM CHLORIDE 0.9 % IV SOLN
480.0000 mg/m2 | Freq: Once | INTRAVENOUS | Status: AC
  Administered 2023-10-31: 880 mg via INTRAVENOUS
  Filled 2023-10-31: qty 44

## 2023-10-31 MED ORDER — SODIUM CHLORIDE 0.9% FLUSH
10.0000 mL | INTRAVENOUS | Status: DC | PRN
  Administered 2023-10-31: 10 mL

## 2023-10-31 MED ORDER — PALONOSETRON HCL INJECTION 0.25 MG/5ML
0.2500 mg | Freq: Once | INTRAVENOUS | Status: AC
  Administered 2023-10-31: 0.25 mg via INTRAVENOUS
  Filled 2023-10-31: qty 5

## 2023-10-31 MED ORDER — DEXAMETHASONE SODIUM PHOSPHATE 10 MG/ML IJ SOLN
10.0000 mg | Freq: Once | INTRAMUSCULAR | Status: AC
  Administered 2023-10-31: 10 mg via INTRAVENOUS
  Filled 2023-10-31: qty 1

## 2023-10-31 MED ORDER — HEPARIN SOD (PORK) LOCK FLUSH 100 UNIT/ML IV SOLN
500.0000 [IU] | Freq: Once | INTRAVENOUS | Status: AC | PRN
  Administered 2023-10-31: 500 [IU]

## 2023-10-31 MED ORDER — SODIUM CHLORIDE 0.9 % IV SOLN: INTRAVENOUS | Status: DC

## 2023-10-31 NOTE — Telephone Encounter (Signed)
 Noted

## 2023-10-31 NOTE — Patient Instructions (Signed)
 [facility] - A DEPT OF [facility]  Discharge Instructions: Thank you for choosing [facility] to provide your oncology and hematology care.   If you have a lab appointment with the Cancer Center, please go directly to the Cancer Center and check in at the registration area.   Wear comfortable clothing and clothing appropriate for easy access to any Portacath or PICC line.   We strive to give you quality time with your provider. You may need to reschedule your appointment if you arrive late (15 or more minutes).  Arriving late affects you and other patients whose appointments are after yours.  Also, if you miss three or more appointments without notifying the office, you may be dismissed from the clinic at the provider's discretion.      For prescription refill requests, have your pharmacy contact our office and allow 72 hours for refills to be completed.    Today you received the following chemotherapy and/or immunotherapy agents: Taxotere , Cyclophosphamide       To help prevent nausea and vomiting after your treatment, we encourage you to take your nausea medication as directed.  BELOW ARE SYMPTOMS THAT SHOULD BE REPORTED IMMEDIATELY: *FEVER GREATER THAN 100.4 F (38 C) OR HIGHER *CHILLS OR SWEATING *NAUSEA AND VOMITING THAT IS NOT CONTROLLED WITH YOUR NAUSEA MEDICATION *UNUSUAL SHORTNESS OF BREATH *UNUSUAL BRUISING OR BLEEDING *URINARY PROBLEMS (pain or burning when urinating, or frequent urination) *BOWEL PROBLEMS (unusual diarrhea, constipation, pain near the anus) TENDERNESS IN MOUTH AND THROAT WITH OR WITHOUT PRESENCE OF ULCERS (sore throat, sores in mouth, or a toothache) UNUSUAL RASH, SWELLING OR PAIN  UNUSUAL VAGINAL DISCHARGE OR ITCHING   Items with * indicate a potential emergency and should be followed up as soon as possible or go to the Emergency Department if any problems should occur.  Please show the CHEMOTHERAPY ALERT CARD  or IMMUNOTHERAPY ALERT CARD at check-in to the Emergency Department and triage nurse.  Should you have questions after your visit or need to cancel or reschedule your appointment, please contact [facility] - A DEPT OF [facility]  Dept: [phone]  and follow the prompts.  Office hours are 8:00 a.m. to 4:30 p.m. Monday - Friday. Please note that voicemails left after 4:00 p.m. may not be returned until the following business day.  We are closed weekends and major holidays. You have access to a nurse at all times for urgent questions. Please call the main number to the clinic Dept: [phone] and follow the prompts.   For any non-urgent questions, you may also contact your provider using MyChart. We now offer e-Visits for anyone 49 and older to request care online for non-urgent symptoms. For details visit [email or web address].   Also download the MyChart app! Go to the app store, search MyChart, open the app, select [place], and log in with your MyChart username and password.

## 2023-10-31 NOTE — Telephone Encounter (Signed)
 Copied from CRM #[id number]. Topic: Clinical - Lab/Test Results >> Oct 30, 2023  5:02 PM Chiquita SQUIBB wrote: Reason for CRM: Powell from [facility] is calling in to confirm if the PAD results she faxed over have been received, nothing noted at this time. If any questions or issues please contact Heather back at [phone]

## 2023-10-31 NOTE — Progress Notes (Signed)
 [facility] Cancer Follow up:    Kimberly Glade PARAS, MD [address] [place] [address]   DIAGNOSIS: Cancer Staging  Malignant neoplasm of lower-inner quadrant of left breast in female, estrogen receptor negative (HCC) Staging form: Breast, AJCC 8th Edition - Clinical stage from 08/28/2023: Stage IIB (cT2, cN0, cM0, G3, ER-, PR-, HER2-) - Signed by Loretha Ash, MD on 08/28/2023 Stage prefix: Initial diagnosis Histologic grading system: 3 grade system Laterality: Left Staged by: Pathologist and managing physician Stage used in treatment planning: Yes National guidelines used in treatment planning: Yes Type of national guideline used in treatment planning: NCCN    SUMMARY OF ONCOLOGIC HISTORY: Oncology History  Malignant neoplasm of lower-inner quadrant of left breast in female, estrogen receptor negative (HCC)  08/15/2023 Mammogram   Highly suspicious mass with associated calcifications in the left breast at 8 o'clock measuring 2.8 cm. The calcifications extend slightly outside of the mass by 0.5 cm or less. Resolution of the previously seen mass in the right breast at 12o'clock. No mammographic evidence of malignancy in the right breast.     08/20/2023 Pathology Results   Left breast needle core biopsy showed grade 3 IDC, triple negative.   08/26/2023 Initial Diagnosis   Malignant neoplasm of lower-inner quadrant of left breast in female, estrogen receptor negative (HCC)   08/28/2023 Cancer Staging   Staging form: Breast, AJCC 8th Edition - Clinical stage from 08/28/2023: Stage IIB (cT2, cN0, cM0, G3, ER-, PR-, HER2-) - Signed by Loretha Ash, MD on 08/28/2023 Stage prefix: Initial diagnosis Histologic grading system: 3 grade system Laterality: Left Staged by: Pathologist and managing physician Stage used in treatment planning: Yes National guidelines used in treatment planning: Yes Type of national guideline used in treatment planning: NCCN   09/19/2023 -   Chemotherapy   Patient is on Treatment Plan : BREAST TC q21d      Genetic Testing   [facility] CancerNext Panel+RNA was Negative. Report date is 09/08/2023.   The [facility] CancerNext+RNAinsight Panel includes sequencing, rearrangement analysis, and RNA analysis for the following 40 genes: APC, ATM, BAP1, BARD1, BMPR1A, BRCA1, BRCA2, BRIP1, CDH1, CDKN2A, CHEK2, FH, FLCN, MET, MLH1, MSH2, MSH6, MUTYH, NF1, NTHL1, PALB2, PMS2, PTEN, RAD51C, RAD51D, RPS20, SMAD4, STK11, TP53, TSC1, TSC2, and VHL (sequencing and deletion/duplication); AXIN2, HOXB13, MBD4, MSH3, POLD1 and POLE (sequencing only); EPCAM and GREM1 (deletion/duplication only).      CURRENT THERAPY: Taxotere /Cytoxan   INTERVAL HISTORY:  Discussed the use of AI scribe software for clinical note transcription with the patient, who gave verbal consent to proceed.  History of Present Illness [name] is a 78 year old female undergoing neoadjuvant chemotherapy for breast cancer who presents for follow-up and evaluation prior to receiving cycle three of Taxotere  and Cytoxan .  She is scheduled for a post-neoadjuvant chemotherapy breast MRI on November 22, 2023. She experiences no significant side effects from the chemotherapy, such as nausea, vomiting, or peripheral edema. Her taste is slightly altered, but she has no neuropathy or pain. Her most recent hemoglobin level is 8.2.    Patient Active Problem List   Diagnosis Date Noted   Port-A-Cath in place 10/31/2023   Skin abnormality 09/30/2023   Genetic testing 09/11/2023   Malignant neoplasm of lower-inner quadrant of left breast in female, estrogen receptor negative (HCC) 08/26/2023   CKD (chronic kidney disease) stage 3, GFR 30-59 ml/min (HCC) 06/16/2023   Muscle cramping 07/09/2022   Weight loss 05/04/2022   Subacute cough 05/04/2022   Breast lump  on right side at 11 o'clock position 12/05/2021   Vitamin D  deficiency 12/04/2021   Low vitamin B12 level 12/04/2021   Clavicle enlargement  07/23/2018   Osteopenia 06/14/2018   Achilles tendinitis 01/29/2017   Bilateral carotid artery occlusion 01/29/2017   Neuropathy at site of breast surgery 01/28/2017   Prediabetes 07/24/2015   GERD (gastroesophageal reflux disease) 07/20/2015   Vitiligo 07/07/2012   PLANTAR FASCIITIS 06/16/2010   Tobacco dependence due to cigarettes 06/14/2009   Hyperlipidemia 11/10/2008   Angioedema 01/09/2008   Hypertension 06/12/2007   Personal history of malignant neoplasm of breast 02/25/2007   RHEUMATIC FEVER, HX OF 02/21/2007    is allergic to benazepril hcl and yellow dyes (non-tartrazine).  MEDICAL HISTORY: Past Medical History:  Diagnosis Date   Absence of menstruation    amenorrhea   Angioneurotic edema not elsewhere classified    due to ACE-I   Breast cancer (HCC)    Breast disorder    breast cancer 2007   Cancer [facility]) 2007   Left Breast Cancer   Carotid stenosis    Carotid US  (8/15):  RICA 40-59%; LICA 40-59% >>> FU 1 year   Chronic kidney disease    CKD3   GERD (gastroesophageal reflux disease)    H/O: rheumatic fever    History of breast cancer 07/13/2015   HLD (hyperlipidemia)    Occlusion and stenosis of carotid artery without mention of cerebral infarction    Other abnormal glucose    fasting hyperglycemia   Peripheral vascular disease (HCC)    Carotid Stenosis   Personal history of malignant neoplasm of breast    Personal history of radiation therapy 2007   Pneumonia    Pre-diabetes    Unspecified essential hypertension     SURGICAL HISTORY: Past Surgical History:  Procedure Laterality Date   APPENDECTOMY  1972   In [place]    BREAST BIOPSY Left 12/06/2005   malignant   BREAST BIOPSY Left 08/20/2023   US  LT BREAST BX W LOC DEV 1ST LESION IMG BX SPEC US  GUIDE 08/20/2023 [facility] MAMMOGRAPHY   BREAST LUMPECTOMY Left 01/08/2006   COLONOSCOPY  2005   negative; Dr Kristie   COLONOSCOPY N/A 09/02/2015   Procedure: COLONOSCOPY;  Surgeon: Margo LITTIE Haddock, MD;   Location: [facility]ITE;  Service: Endoscopy;  Laterality: N/A;  1215-moved to 1130 Ginger to notify pt   PORTACATH PLACEMENT N/A 09/18/2023   Procedure: INSERTION, TUNNELED CENTRAL VENOUS DEVICE, WITH PORT;  Surgeon: Vanderbilt Ned, MD;  Location: [facility] OR;  Service: General;  Laterality: N/A;    SOCIAL HISTORY: Social History   Socioeconomic History   Marital status: Single    Spouse name: Not on file   Number of children: Not on file   Years of education: Not on file   Highest education level: Not on file  Occupational History   Not on file  Tobacco Use   Smoking status: Every Day    Current packs/day: 0.50    Average packs/day: 0.5 packs/day for 48.0 years (24.0 ttl pk-yrs)    Types: Cigarettes    Passive exposure: Current   Smokeless tobacco: Never   Tobacco comments:    Smokes daily.    Vaping Use   Vaping status: Never Used  Substance and Sexual Activity   Alcohol use: Yes    Alcohol/week: 1.0 standard drink of alcohol    Types: 1 Standard drinks or equivalent per week    Comment: occasionally   Drug use: No   Sexual activity: Yes  Birth control/protection: Post-menopausal  Other Topics Concern   Not on file  Social History Narrative   Single, no children.   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    Social Drivers of Health   Financial Resource Strain: Medium Risk (05/31/2023)   Overall Financial Resource Strain (CARDIA)    Difficulty of Paying Living Expenses: Somewhat hard  Food Insecurity: No Food Insecurity (08/28/2023)   Hunger Vital Sign    Worried About Running Out of Food in the Last Year: Never true    Ran Out of Food in the Last Year: Never true  Transportation Needs: No Transportation Needs (08/28/2023)   PRAPARE - Administrator, Civil Service (Medical): No    Lack of Transportation (Non-Medical): No  Physical Activity: Sufficiently Active (05/31/2023)   Exercise Vital Sign    Days of Exercise per Week: 5 days    Minutes of  Exercise per Session: 60 min  Stress: No Stress Concern Present (05/31/2023)   [employer] of Occupational Health - Occupational Stress Questionnaire    Feeling of Stress : Not at all  Social Connections: Moderately Isolated (05/31/2023)   Social Connection and Isolation Panel    Frequency of Communication with Friends and Family: More than three times a week    Frequency of Social Gatherings with Friends and Family: More than three times a week    Attends Religious Services: 1 to 4 times per year    Active Member of [employer] or Organizations: No    Attends Banker Meetings: Never    Marital Status: Never married  Intimate Partner Violence: Not At Risk (08/28/2023)   Humiliation, Afraid, Rape, and Kick questionnaire    Fear of Current or Ex-Partner: No    Emotionally Abused: No    Physically Abused: No    Sexually Abused: No    FAMILY HISTORY: Family History  Problem Relation Age of Onset   Breast cancer Sister 35   Hypertension Other        some of Pts brothers and sisters   Heart attack Neg Hx    Stroke Neg Hx    Diabetes Neg Hx    Heart disease Neg Hx     Review of Systems  Constitutional:  Negative for appetite change, chills, fatigue, fever and unexpected weight change.  HENT:   Negative for hearing loss, lump/mass and trouble swallowing.   Eyes:  Negative for eye problems and icterus.  Respiratory:  Negative for chest tightness, cough and shortness of breath.   Cardiovascular:  Negative for chest pain, leg swelling and palpitations.  Gastrointestinal:  Negative for abdominal distention, abdominal pain, constipation, diarrhea, nausea and vomiting.  Endocrine: Negative for hot flashes.  Genitourinary:  Negative for difficulty urinating.   Musculoskeletal:  Negative for arthralgias.  Skin:  Negative for itching and rash.  Neurological:  Negative for dizziness, extremity weakness, headaches and numbness.  Hematological:  Negative for adenopathy. Does not  bruise/bleed easily.  Psychiatric/Behavioral:  Negative for depression. The patient is not nervous/anxious.       PHYSICAL EXAMINATION   Onc Performance Status - 10/31/23 1024       ECOG Perf Status   ECOG Perf Status Restricted in physically strenuous activity but ambulatory and able to carry out work of a light or sedentary nature, e.g., light house work, office work      KPS SCALE   KPS % SCORE Able to carry on normal activity, minor s/s of disease  Vitals:   10/31/23 1016  BP: (!) 167/63  Pulse: (!) 58  Resp: 18  Temp: (!) 97.4 F (36.3 C)  SpO2: 97%    Physical Exam Constitutional:      General: She is not in acute distress.    Appearance: Normal appearance. She is not toxic-appearing.  HENT:     Head: Normocephalic and atraumatic.     Mouth/Throat:     Mouth: Mucous membranes are moist.     Pharynx: Oropharynx is clear. No oropharyngeal exudate or posterior oropharyngeal erythema.  Eyes:     General: No scleral icterus. Cardiovascular:     Rate and Rhythm: Normal rate and regular rhythm.     Pulses: Normal pulses.     Heart sounds: Normal heart sounds.  Pulmonary:     Effort: Pulmonary effort is normal.     Breath sounds: Normal breath sounds.  Abdominal:     General: Abdomen is flat. Bowel sounds are normal. There is no distension.     Palpations: Abdomen is soft.     Tenderness: There is no abdominal tenderness.  Musculoskeletal:        General: No swelling.     Cervical back: Neck supple.  Lymphadenopathy:     Cervical: No cervical adenopathy.  Skin:    General: Skin is warm and dry.     Findings: No rash.  Neurological:     General: No focal deficit present.     Mental Status: She is alert.  Psychiatric:        Mood and Affect: Mood normal.        Behavior: Behavior normal.     LABORATORY DATA:  CBC    Component Value Date/Time   WBC 5.6 10/31/2023 0937   WBC 9.4 05/04/2022 1355   RBC 2.96 (L) 10/31/2023 0937   HGB 8.2 (L)  10/31/2023 0937   HGB 13.3 06/27/2020 1354   HGB 12.4 05/07/2011 1356   HCT 26.0 (L) 10/31/2023 0937   HCT 40.3 06/27/2020 1354   HCT 37.8 05/07/2011 1356   PLT 375 10/31/2023 0937   PLT 287 06/27/2020 1354   MCV 87.8 10/31/2023 0937   MCV 85 06/27/2020 1354   MCV 84.3 05/07/2011 1356   MCH 27.7 10/31/2023 0937   MCHC 31.5 10/31/2023 0937   RDW 15.9 (H) 10/31/2023 0937   RDW 13.1 06/27/2020 1354   RDW 12.9 05/07/2011 1356   LYMPHSABS 1.4 10/31/2023 0937   LYMPHSABS 4.4 (H) 06/27/2020 1354   LYMPHSABS 5.1 (H) 05/07/2011 1356   MONOABS 0.4 10/31/2023 0937   MONOABS 0.5 05/07/2011 1356   EOSABS 0.0 10/31/2023 0937   EOSABS 0.1 06/27/2020 1354   BASOSABS 0.0 10/31/2023 0937   BASOSABS 0.0 06/27/2020 1354   BASOSABS 0.1 05/07/2011 1356    CMP     Component Value Date/Time   NA 142 10/09/2023 0737   NA 136 06/27/2020 1354   K 4.3 10/09/2023 0737   CL 111 10/09/2023 0737   CO2 23 10/09/2023 0737   GLUCOSE 100 (H) 10/09/2023 0737   BUN 20 10/09/2023 0737   BUN 15 06/27/2020 1354   CREATININE 1.54 (H) 10/09/2023 0737   CALCIUM  9.0 10/09/2023 0737   PROT 6.8 10/09/2023 0737   PROT 7.5 01/31/2021 1027   ALBUMIN 3.5 10/09/2023 0737   ALBUMIN 4.3 01/31/2021 1027   AST 8 (L) 10/09/2023 0737   ALT 8 10/09/2023 0737   ALKPHOS 58 10/09/2023 0737   BILITOT 0.4 10/09/2023 0737   GFRNONAA  35 (L) 10/09/2023 0737     ASSESSMENT and THERAPY PLAN:   Assessment and Plan Assessment & Plan Breast cancer, undergoing neoadjuvant chemotherapy Undergoing neoadjuvant chemotherapy with Taxotere  and Cytoxan . Discussed reducing chemotherapy dose by 20% to manage anemia and avoid transfusion. - Administer 20% reduced dose of chemotherapy. - Schedule breast MRI on November 22, 2023.  Anemia due to chemotherapy Hemoglobin at 8.2, likely due to chemotherapy. Iron, B12, and folate levels normal. Discussed avoiding transfusion by reducing chemotherapy dose. - Monitor hemoglobin levels and  symptoms. - Continue B12 supplementation. - Avoid iron supplementation.  Hypertension Managed with hydralazine . Occasional missed doses reported. - Continue hydralazine  regimen. - Encourage adherence to medication schedule.  Ankle pain post-accident Ankle pain post-accident. X-ray reviewed. Discussed conservative management options. - Consider compression stockings. - If symptoms persist, consider referral to sports medicine.  RTC in 3 weeks for labs, f/u, and cycle 4 of chemotherapy.     All questions were answered. The patient knows to call the clinic with any problems, questions or concerns. We can certainly see the patient much sooner if necessary.  Total encounter time:30 minutes*in face-to-face visit time, chart review, lab review, care coordination, order entry, and documentation of the encounter time.    Morna Kendall, NP 10/31/23 10:32 AM Medical Oncology and Hematology [facility] [address] [place], [place] [address] Tel. [phone]    Fax. [phone]  *Total Encounter Time as defined by the Centers for Medicare and Medicaid Services includes, in addition to the face-to-face time of a patient visit (documented in the note above) non-face-to-face time: obtaining and reviewing outside history, ordering and reviewing medications, tests or procedures, care coordination (communications with other health care professionals or caregivers) and documentation in the medical record.

## 2023-11-02 ENCOUNTER — Inpatient Hospital Stay

## 2023-11-02 VITALS — BP 157/72 | HR 68 | Temp 97.5°F | Resp 13

## 2023-11-02 DIAGNOSIS — C50312 Malignant neoplasm of lower-inner quadrant of left female breast: Secondary | ICD-10-CM

## 2023-11-02 DIAGNOSIS — Z5111 Encounter for antineoplastic chemotherapy: Secondary | ICD-10-CM | POA: Diagnosis not present

## 2023-11-02 MED ORDER — PEGFILGRASTIM-JMDB 6 MG/0.6ML ~~LOC~~ SOSY
6.0000 mg | PREFILLED_SYRINGE | Freq: Once | SUBCUTANEOUS | Status: AC
  Administered 2023-11-02: 6 mg via SUBCUTANEOUS
  Filled 2023-11-02: qty 0.6

## 2023-11-04 ENCOUNTER — Encounter: Payer: Self-pay | Admitting: Hematology and Oncology

## 2023-11-07 ENCOUNTER — Other Ambulatory Visit: Payer: Self-pay | Admitting: *Deleted

## 2023-11-07 ENCOUNTER — Telehealth: Payer: Self-pay | Admitting: *Deleted

## 2023-11-07 ENCOUNTER — Encounter: Payer: Self-pay | Admitting: Hematology and Oncology

## 2023-11-07 MED ORDER — AMOXICILLIN-POT CLAVULANATE 875-125 MG PO TABS: 1.0000 | ORAL_TABLET | Freq: Two times a day (BID) | ORAL | 0 refills | Status: AC

## 2023-11-07 NOTE — Telephone Encounter (Addendum)
 Pt called to this RN stating she broke a tooth now having pain,  She states concern due to being told she should not go to the dentist while getting chemotherapy.  Pt is on chemotherapy every 3 weeks with growth factor support.  Last treatment was 7/24 with neulasta  given on 7/26.  She states she has called her dentist and obtained a mouth wash for benefit- if ok to be seen by dentist they will need clearance from this office.  Pt does not have any fevers.  Per review with Dr Loretha pt should proceed to dentist as well order given for antibiotic.  This RN called pt's dental office - Dr Hermon- was informed she would be out of the office for 2 weeks and was referred to covering dentist Dr Delmer ( both in [place]).  This RN called to Dr Pearla and obtained appt for 8/4 at 10 am.  This RN called pt and discussed above with understanding of plan as well as pharmacy verified - prescription for antibiotic sent.  Of note phone number for Dr Delmer is [phone] fax [phone]

## 2023-11-07 NOTE — Telephone Encounter (Signed)
 See prior note

## 2023-11-10 NOTE — Progress Notes (Signed)
      Subjective:    Patient ID: [name], female    DOB: [birth year], 78 y.o.   MRN: [id number]     HPI Lyly is here for follow up of her chronic medical problems - hypertension.   Getting chemo for breast cancer.  Recent BP 167/63 at oncology.   Hydralazine  increased 2 months ago.  BP still elevated at oncology visits     Medications and allergies reviewed with patient and updated if appropriate.  Current Outpatient Medications on File Prior to Visit  Medication Sig Dispense Refill  . acetaminophen  (TYLENOL ) 325 MG tablet Take 650 mg by mouth every 6 (six) hours as needed for moderate pain (pain score 4-6).    . atorvastatin  (LIPITOR) 40 MG tablet Take 1 tablet (40 mg total) by mouth daily. 90 tablet 1  . EPINEPHrine  0.3 mg/0.3 mL IJ SOAJ injection Inject 0.3 mg into the muscle as needed for anaphylaxis. 1 each 1  . omeprazole  (PRILOSEC) 40 MG capsule TAKE 1 CAPSULE BY MOUTH EVERY DAY 90 capsule 0   No current facility-administered medications on file prior to visit.     Review of Systems     Objective:  There were no vitals filed for this visit. BP Readings from Last 3 Encounters:  12/03/23 (!) 195/55  11/29/23 (!) 170/70  11/26/23 (!) 164/64   Wt Readings from Last 3 Encounters:  12/03/23 158 lb 1.6 oz (71.7 kg)  11/29/23 156 lb 12.8 oz (71.1 kg)  11/27/23 148 lb (67.1 kg)   There is no height or weight on file to calculate BMI.    Physical Exam     Lab Results  Component Value Date   WBC 8.7 12/03/2023   HGB 9.5 (L) 12/03/2023   HCT 30.5 (L) 12/03/2023   PLT 344 12/03/2023   GLUCOSE 95 12/03/2023   CHOL 157 12/05/2021   TRIG 104.0 12/05/2021   HDL 50.70 12/05/2021   LDLDIRECT 141.8 07/31/2012   LDLCALC 86 12/05/2021   ALT 14 12/03/2023   AST 17 12/03/2023   NA 140 12/03/2023   K 4.2 12/03/2023   CL 106 12/03/2023   CREATININE 1.48 (H) 12/03/2023   BUN 29 (H) 12/03/2023   CO2 26 12/03/2023   TSH 1.23 05/04/2022   HGBA1C 5.9  05/04/2022     Assessment & Plan:    See Problem List for Assessment and Plan of chronic medical problems.    This encounter was created in error - please disregard.

## 2023-11-13 ENCOUNTER — Telehealth: Payer: Self-pay | Admitting: Pharmacist

## 2023-11-13 ENCOUNTER — Encounter: Payer: Self-pay | Admitting: Internal Medicine

## 2023-11-13 NOTE — Progress Notes (Signed)
 Pharmacy Quality Measure Review  This patient is appearing on a report for being at risk of failing the adherence measure for cholesterol (statin) medications this calendar year.   Medication: Atorvastatin  40 mg daily Last fill date: 11/09/56 for 90 day supply  Insurance report was not up to date. No action needed at this time.

## 2023-11-13 NOTE — Patient Instructions (Incomplete)
      You should not take any NSAIDs-Advil , ibuprofen , Aleve , Goody powders  Medications changes include :   Increase hydralazine  to 100 mg 3 times daily.  Increase labetalol  to 400 mg twice daily, start spironolactone  12.5 mg daily (half pill daily).  Continue amlodipine  10 mg daily   Monitor your blood pressure daily   Return in about 4 weeks (around 12/12/2023) for hypertension.

## 2023-11-14 ENCOUNTER — Encounter: Admitting: Internal Medicine

## 2023-11-14 DIAGNOSIS — I10 Essential (primary) hypertension: Secondary | ICD-10-CM

## 2023-11-14 NOTE — Assessment & Plan Note (Addendum)
 Chronic Blood pressure not controlled Currently taking labetalol  300 mg twice daily, amlodipine  10 mg daily and hydralazine  75 mg 3 times daily Increased hydralazine  to 100 mg 3 times daily, increase labetalol  to 400 mg twice daily and continue amlodipine  10 mg daily Add spironolactone  12.5 mg daily

## 2023-11-14 NOTE — Assessment & Plan Note (Signed)
 Blood pressure not controlled Will adjust BP medication Stressed good water  intake Stop all NSAIDs

## 2023-11-19 ENCOUNTER — Encounter: Payer: Self-pay | Admitting: Internal Medicine

## 2023-11-19 NOTE — Patient Instructions (Incomplete)
      Blood work was ordered.   Chest xray ordered    Medications changes include :  increase hydralazine  to 100 mg three times a day, increase labetolol to 400 mg twice daily. Start spirolotacone 12.5 mg daily  For your blood pressure: Amlodipine  Labetalol  Hydralazine  spironolactone     Return in about 2 weeks (around 12/04/2023) for follow up, hypertension.

## 2023-11-19 NOTE — Progress Notes (Signed)
 Subjective:    Patient ID: [name], female    DOB: April 03, 1946, 78 y.o.   MRN: [id number]     HPI [name] is here for follow up of her chronic medical problems - hypertension.   SOB  - started a couple of days ago.  Occurs with exertion - takes a while to recover.  She states weakness.    Tomorrow has infusion and labs.    Getting chemo for breast cancer.  Recent BP 167/63 at oncology.   Hydralazine  increased 2 months ago.  BP still elevated at oncology visits   Not taking amlodipine  for the past couple of days-she thinks she was taken it before that, but is not 100%..     Medications and allergies reviewed with patient and updated if appropriate.  Current Outpatient Medications on File Prior to Visit  Medication Sig Dispense Refill   acetaminophen  (TYLENOL ) 325 MG tablet Take 650 mg by mouth every 6 (six) hours as needed for moderate pain (pain score 4-6).     atorvastatin  (LIPITOR) 40 MG tablet Take 1 tablet (40 mg total) by mouth daily. 90 tablet 1   EPINEPHrine  0.3 mg/0.3 mL IJ SOAJ injection Inject 0.3 mg into the muscle as needed for anaphylaxis. 1 each 1   lidocaine -prilocaine  (EMLA ) cream Apply to affected area once 30 g 3   omeprazole  (PRILOSEC) 40 MG capsule TAKE 1 CAPSULE BY MOUTH EVERY DAY 90 capsule 0   ondansetron  (ZOFRAN ) 8 MG tablet Take 1 tablet (8 mg total) by mouth every 8 (eight) hours as needed for nausea or vomiting. Start on the third day after chemotherapy. 30 tablet 1   prochlorperazine  (COMPAZINE ) 10 MG tablet Take 1 tablet (10 mg total) by mouth every 6 (six) hours as needed for nausea or vomiting. 30 tablet 1   traMADol  (ULTRAM ) 50 MG tablet Take 1 tablet (50 mg total) by mouth every 6 (six) hours as needed for moderate pain (pain score 4-6) or severe pain (pain score 7-10). 10 tablet 0   dexamethasone  (DECADRON ) 4 MG tablet Take 2 tabs by mouth 2 times daily starting day before chemo. Then take 2 tabs daily for 2 days starting day after  chemo. Take with food. (Patient not taking: Reported on 11/20/2023) 30 tablet 1   No current facility-administered medications on file prior to visit.     Review of Systems  Constitutional:  Positive for appetite change (dec). Negative for fever.  HENT:  Negative for congestion, sinus pain and sore throat.   Respiratory:  Positive for shortness of breath. Negative for cough, chest tightness and wheezing.   Cardiovascular:  Positive for leg swelling (right ankle - intermittent). Negative for chest pain and palpitations.  Musculoskeletal:        No leg pain  Neurological:  Negative for light-headedness and headaches.       Objective:   Vitals:   11/20/23 1001 11/20/23 1100  BP: (!) 160/100 (!) 152/110  Pulse: 78   Temp: 98 F (36.7 C)   SpO2: 90%    BP Readings from Last 3 Encounters:  11/20/23 (!) 152/110  11/02/23 (!) 157/72  10/31/23 (!) 167/63   Wt Readings from Last 3 Encounters:  11/20/23 154 lb (69.9 kg)  10/31/23 161 lb 6.4 oz (73.2 kg)  10/09/23 161 lb 3.2 oz (73.1 kg)   Body mass index is 26.43 kg/m.    Physical Exam Constitutional:      General: She is not in acute distress.  Appearance: Normal appearance.  HENT:     Head: Normocephalic and atraumatic.  Eyes:     Conjunctiva/sclera: Conjunctivae normal.  Cardiovascular:     Rate and Rhythm: Normal rate and regular rhythm.     Heart sounds: Normal heart sounds.  Pulmonary:     Effort: Respiratory distress (Slight shortness of breath visible with mild-moderate exertion) present.     Breath sounds: Normal breath sounds. No wheezing or rales.  Musculoskeletal:        General: No tenderness (No calf tenderness).     Cervical back: Neck supple.     Right lower leg: No edema.     Left lower leg: No edema.  Lymphadenopathy:     Cervical: No cervical adenopathy.  Skin:    General: Skin is warm and dry.     Findings: No rash.  Neurological:     Mental Status: She is alert. Mental status is at  baseline.  Psychiatric:        Mood and Affect: Mood normal.        Behavior: Behavior normal.        Lab Results  Component Value Date   WBC 5.6 10/31/2023   HGB 8.2 (L) 10/31/2023   HCT 26.0 (L) 10/31/2023   PLT 375 10/31/2023   GLUCOSE 92 10/31/2023   CHOL 157 12/05/2021   TRIG 104.0 12/05/2021   HDL 50.70 12/05/2021   LDLDIRECT 141.8 07/31/2012   LDLCALC 86 12/05/2021   ALT 13 10/31/2023   AST 15 10/31/2023   NA 141 10/31/2023   K 4.2 10/31/2023   CL 109 10/31/2023   CREATININE 1.49 (H) 10/31/2023   BUN 21 10/31/2023   CO2 26 10/31/2023   TSH 1.23 05/04/2022   HGBA1C 5.9 05/04/2022     Assessment & Plan:    See Problem List for Assessment and Plan of chronic medical problems.

## 2023-11-19 NOTE — Patient Instructions (Addendum)
      Blood work was ordered.   Chest xray ordered    Medications changes include :  increase hydralazine  to 100 mg three times a day, increase labetolol to 400 mg twice daily. Start spirolotacone 12.5 mg daily  For your blood pressure: Amlodipine  Labetalol  Hydralazine  spironolactone     Return in about 2 weeks (around 12/04/2023) for follow up, hypertension.

## 2023-11-20 ENCOUNTER — Ambulatory Visit (INDEPENDENT_AMBULATORY_CARE_PROVIDER_SITE_OTHER)

## 2023-11-20 ENCOUNTER — Emergency Department (HOSPITAL_COMMUNITY)

## 2023-11-20 ENCOUNTER — Encounter (HOSPITAL_COMMUNITY): Payer: Self-pay | Admitting: *Deleted

## 2023-11-20 ENCOUNTER — Other Ambulatory Visit: Payer: Self-pay

## 2023-11-20 ENCOUNTER — Telehealth: Payer: Self-pay | Admitting: Internal Medicine

## 2023-11-20 ENCOUNTER — Inpatient Hospital Stay (HOSPITAL_COMMUNITY)
Admission: EM | Admit: 2023-11-20 | Discharge: 2023-11-26 | DRG: 291 | Disposition: A | Discharge status: Home / Self Care | Source: Ambulatory Visit | Attending: Internal Medicine | Admitting: Internal Medicine

## 2023-11-20 ENCOUNTER — Ambulatory Visit (INDEPENDENT_AMBULATORY_CARE_PROVIDER_SITE_OTHER): Admitting: Internal Medicine

## 2023-11-20 ENCOUNTER — Telehealth: Payer: Self-pay

## 2023-11-20 VITALS — BP 152/110 | HR 78 | Temp 98.0°F | Ht 64.0 in | Wt 154.0 lb

## 2023-11-20 DIAGNOSIS — R0602 Shortness of breath: Secondary | ICD-10-CM | POA: Diagnosis not present

## 2023-11-20 DIAGNOSIS — M7989 Other specified soft tissue disorders: Secondary | ICD-10-CM | POA: Diagnosis present

## 2023-11-20 DIAGNOSIS — R918 Other nonspecific abnormal finding of lung field: Secondary | ICD-10-CM | POA: Diagnosis not present

## 2023-11-20 DIAGNOSIS — R7989 Other specified abnormal findings of blood chemistry: Secondary | ICD-10-CM | POA: Diagnosis present

## 2023-11-20 DIAGNOSIS — I509 Heart failure, unspecified: Secondary | ICD-10-CM | POA: Diagnosis not present

## 2023-11-20 DIAGNOSIS — I161 Hypertensive emergency: Secondary | ICD-10-CM | POA: Diagnosis present

## 2023-11-20 DIAGNOSIS — I517 Cardiomegaly: Secondary | ICD-10-CM | POA: Diagnosis not present

## 2023-11-20 DIAGNOSIS — Y92009 Unspecified place in unspecified non-institutional (private) residence as the place of occurrence of the external cause: Secondary | ICD-10-CM

## 2023-11-20 DIAGNOSIS — R911 Solitary pulmonary nodule: Secondary | ICD-10-CM | POA: Diagnosis present

## 2023-11-20 DIAGNOSIS — I10 Essential (primary) hypertension: Secondary | ICD-10-CM | POA: Diagnosis present

## 2023-11-20 DIAGNOSIS — I700 Atherosclerosis of aorta: Secondary | ICD-10-CM | POA: Diagnosis present

## 2023-11-20 DIAGNOSIS — Z87891 Personal history of nicotine dependence: Secondary | ICD-10-CM

## 2023-11-20 DIAGNOSIS — I110 Hypertensive heart disease with heart failure: Secondary | ICD-10-CM | POA: Diagnosis not present

## 2023-11-20 DIAGNOSIS — C50911 Malignant neoplasm of unspecified site of right female breast: Secondary | ICD-10-CM | POA: Diagnosis present

## 2023-11-20 DIAGNOSIS — Z8679 Personal history of other diseases of the circulatory system: Secondary | ICD-10-CM

## 2023-11-20 DIAGNOSIS — T451X5A Adverse effect of antineoplastic and immunosuppressive drugs, initial encounter: Secondary | ICD-10-CM | POA: Diagnosis present

## 2023-11-20 DIAGNOSIS — R0609 Other forms of dyspnea: Secondary | ICD-10-CM | POA: Diagnosis not present

## 2023-11-20 DIAGNOSIS — T508X5A Adverse effect of diagnostic agents, initial encounter: Secondary | ICD-10-CM | POA: Diagnosis present

## 2023-11-20 DIAGNOSIS — M109 Gout, unspecified: Secondary | ICD-10-CM | POA: Diagnosis present

## 2023-11-20 DIAGNOSIS — I5033 Acute on chronic diastolic (congestive) heart failure: Secondary | ICD-10-CM | POA: Diagnosis present

## 2023-11-20 DIAGNOSIS — F17200 Nicotine dependence, unspecified, uncomplicated: Secondary | ICD-10-CM | POA: Diagnosis not present

## 2023-11-20 DIAGNOSIS — Z79899 Other long term (current) drug therapy: Secondary | ICD-10-CM

## 2023-11-20 DIAGNOSIS — Z853 Personal history of malignant neoplasm of breast: Secondary | ICD-10-CM

## 2023-11-20 DIAGNOSIS — E785 Hyperlipidemia, unspecified: Secondary | ICD-10-CM | POA: Diagnosis present

## 2023-11-20 DIAGNOSIS — T380X5A Adverse effect of glucocorticoids and synthetic analogues, initial encounter: Secondary | ICD-10-CM | POA: Diagnosis present

## 2023-11-20 DIAGNOSIS — Z888 Allergy status to other drugs, medicaments and biological substances status: Secondary | ICD-10-CM

## 2023-11-20 DIAGNOSIS — Z9102 Food additives allergy status: Secondary | ICD-10-CM

## 2023-11-20 DIAGNOSIS — D6481 Anemia due to antineoplastic chemotherapy: Secondary | ICD-10-CM | POA: Diagnosis present

## 2023-11-20 DIAGNOSIS — Z803 Family history of malignant neoplasm of breast: Secondary | ICD-10-CM

## 2023-11-20 DIAGNOSIS — N1411 Contrast-induced nephropathy: Secondary | ICD-10-CM | POA: Diagnosis present

## 2023-11-20 DIAGNOSIS — I130 Hypertensive heart and chronic kidney disease with heart failure and stage 1 through stage 4 chronic kidney disease, or unspecified chronic kidney disease: Principal | ICD-10-CM | POA: Diagnosis present

## 2023-11-20 DIAGNOSIS — D630 Anemia in neoplastic disease: Secondary | ICD-10-CM | POA: Diagnosis present

## 2023-11-20 DIAGNOSIS — R590 Localized enlarged lymph nodes: Secondary | ICD-10-CM | POA: Diagnosis present

## 2023-11-20 DIAGNOSIS — I771 Stricture of artery: Secondary | ICD-10-CM | POA: Diagnosis present

## 2023-11-20 DIAGNOSIS — M79661 Pain in right lower leg: Secondary | ICD-10-CM | POA: Diagnosis present

## 2023-11-20 DIAGNOSIS — Z923 Personal history of irradiation: Secondary | ICD-10-CM

## 2023-11-20 DIAGNOSIS — Z8249 Family history of ischemic heart disease and other diseases of the circulatory system: Secondary | ICD-10-CM

## 2023-11-20 DIAGNOSIS — R7303 Prediabetes: Secondary | ICD-10-CM | POA: Diagnosis present

## 2023-11-20 DIAGNOSIS — Z886 Allergy status to analgesic agent status: Secondary | ICD-10-CM

## 2023-11-20 DIAGNOSIS — N178 Other acute kidney failure: Secondary | ICD-10-CM | POA: Diagnosis present

## 2023-11-20 DIAGNOSIS — I6529 Occlusion and stenosis of unspecified carotid artery: Secondary | ICD-10-CM | POA: Diagnosis present

## 2023-11-20 DIAGNOSIS — M79662 Pain in left lower leg: Secondary | ICD-10-CM | POA: Diagnosis present

## 2023-11-20 DIAGNOSIS — F411 Generalized anxiety disorder: Secondary | ICD-10-CM | POA: Diagnosis present

## 2023-11-20 DIAGNOSIS — N1832 Chronic kidney disease, stage 3b: Secondary | ICD-10-CM | POA: Diagnosis present

## 2023-11-20 DIAGNOSIS — D72829 Elevated white blood cell count, unspecified: Secondary | ICD-10-CM | POA: Diagnosis present

## 2023-11-20 DIAGNOSIS — I739 Peripheral vascular disease, unspecified: Secondary | ICD-10-CM | POA: Diagnosis present

## 2023-11-20 LAB — CBC
HCT: 29.2 % — ABNORMAL LOW (ref 36.0–46.0)
Hemoglobin: 9.1 g/dL — ABNORMAL LOW (ref 12.0–15.0)
MCH: 28.4 pg (ref 26.0–34.0)
MCHC: 31.2 g/dL (ref 30.0–36.0)
MCV: 91.3 fL (ref 80.0–100.0)
Platelets: 409 K/uL — ABNORMAL HIGH (ref 150–400)
RBC: 3.2 MIL/uL — ABNORMAL LOW (ref 3.87–5.11)
RDW: 20.7 % — ABNORMAL HIGH (ref 11.5–15.5)
WBC: 14.4 K/uL — ABNORMAL HIGH (ref 4.0–10.5)
nRBC: 0 % (ref 0.0–0.2)

## 2023-11-20 LAB — COMPREHENSIVE METABOLIC PANEL WITH GFR
ALT: 11 U/L (ref 0–44)
AST: 16 U/L (ref 15–41)
Albumin: 3 g/dL — ABNORMAL LOW (ref 3.5–5.0)
Alkaline Phosphatase: 100 U/L (ref 38–126)
Anion gap: 11 (ref 5–15)
BUN: 12 mg/dL (ref 8–23)
CO2: 20 mmol/L — ABNORMAL LOW (ref 22–32)
Calcium: 8.9 mg/dL (ref 8.9–10.3)
Chloride: 110 mmol/L (ref 98–111)
Creatinine, Ser: 1.04 mg/dL — ABNORMAL HIGH (ref 0.44–1.00)
GFR, Estimated: 56 mL/min — ABNORMAL LOW (ref >60)
Glucose, Bld: 107 mg/dL — ABNORMAL HIGH (ref 70–99)
Potassium: 3.6 mmol/L (ref 3.5–5.1)
Sodium: 141 mmol/L (ref 135–145)
Total Bilirubin: 1 mg/dL (ref 0.0–1.2)
Total Protein: 7 g/dL (ref 6.5–8.1)

## 2023-11-20 LAB — BASIC METABOLIC PANEL WITH GFR
BUN: 12 mg/dL (ref 6–23)
CO2: 21 meq/L (ref 19–32)
Calcium: 9.2 mg/dL (ref 8.4–10.5)
Chloride: 107 meq/L (ref 96–112)
Creatinine, Ser: 0.94 mg/dL (ref 0.40–1.20)
GFR: 58.96 mL/min — ABNORMAL LOW (ref >60.00)
Glucose, Bld: 111 mg/dL — ABNORMAL HIGH (ref 70–99)
Potassium: 3.7 meq/L (ref 3.5–5.1)
Sodium: 141 meq/L (ref 135–145)

## 2023-11-20 LAB — BRAIN NATRIURETIC PEPTIDE
B Natriuretic Peptide: 918.9 pg/mL — ABNORMAL HIGH (ref 0.0–100.0)
Pro B Natriuretic peptide (BNP): 1267 pg/mL — ABNORMAL HIGH (ref 0.0–100.0)

## 2023-11-20 LAB — TROPONIN I (HIGH SENSITIVITY)
High Sens Troponin I: 31 ng/L (ref 2–17)
Troponin I (High Sensitivity): 31 ng/L — ABNORMAL HIGH (ref <18)
Troponin I (High Sensitivity): 31 ng/L — ABNORMAL HIGH (ref <18)

## 2023-11-20 LAB — D-DIMER, QUANTITATIVE: D-Dimer, Quant: 1.36 ug{FEU}/mL — ABNORMAL HIGH (ref <0.50)

## 2023-11-20 MED ORDER — LABETALOL HCL 200 MG PO TABS
400.0000 mg | ORAL_TABLET | Freq: Two times a day (BID) | ORAL | Status: DC
  Administered 2023-11-20 – 2023-11-23 (×7): 400 mg via ORAL
  Filled 2023-11-20 (×7): qty 2

## 2023-11-20 MED ORDER — AMLODIPINE BESYLATE 10 MG PO TABS: 10.0000 mg | ORAL_TABLET | Freq: Every day | ORAL | 2 refills | Status: AC

## 2023-11-20 MED ORDER — SODIUM CHLORIDE 0.9% FLUSH: 10.0000 mL | INTRAVENOUS | Status: DC | PRN

## 2023-11-20 MED ORDER — SODIUM CHLORIDE 0.9% FLUSH
10.0000 mL | Freq: Two times a day (BID) | INTRAVENOUS | Status: DC
  Administered 2023-11-21: 20 mL
  Administered 2023-11-21 – 2023-11-26 (×9): 10 mL

## 2023-11-20 MED ORDER — LABETALOL HCL 200 MG PO TABS: 400.0000 mg | ORAL_TABLET | Freq: Two times a day (BID) | ORAL | 5 refills | Status: DC

## 2023-11-20 MED ORDER — FUROSEMIDE 10 MG/ML IJ SOLN
60.0000 mg | Freq: Once | INTRAMUSCULAR | Status: AC
  Administered 2023-11-20 (×2): 60 mg via INTRAVENOUS
  Filled 2023-11-20: qty 6

## 2023-11-20 MED ORDER — SPIRONOLACTONE 25 MG PO TABS: 12.5000 mg | ORAL_TABLET | Freq: Every day | ORAL | 1 refills | Status: DC

## 2023-11-20 MED ORDER — IOHEXOL 350 MG/ML SOLN
50.0000 mL | Freq: Once | INTRAVENOUS | Status: AC | PRN
  Administered 2023-11-20 (×2): 50 mL via INTRAVENOUS

## 2023-11-20 MED ORDER — HYDRALAZINE HCL 50 MG PO TABS
100.0000 mg | ORAL_TABLET | Freq: Three times a day (TID) | ORAL | Status: DC
  Administered 2023-11-20 – 2023-11-22 (×4): 100 mg via ORAL
  Filled 2023-11-20 (×4): qty 2

## 2023-11-20 MED ORDER — HYDRALAZINE HCL 100 MG PO TABS: 100.0000 mg | ORAL_TABLET | Freq: Three times a day (TID) | ORAL | 5 refills | Status: DC

## 2023-11-20 MED ORDER — CHLORHEXIDINE GLUCONATE CLOTH 2 % EX PADS
6.0000 | MEDICATED_PAD | Freq: Every day | CUTANEOUS | Status: DC
  Administered 2023-11-22 – 2023-11-26 (×5): 6 via TOPICAL

## 2023-11-20 MED ORDER — BUPROPION HCL ER (XL) 150 MG PO TB24: ORAL_TABLET | ORAL | 2 refills | Status: DC

## 2023-11-20 NOTE — Telephone Encounter (Signed)
 Patient made it to [facility] and is currently being worked up now.

## 2023-11-20 NOTE — ED Provider Notes (Signed)
 [place] EMERGENCY DEPARTMENT AT [facility] Provider Note   CSN: [id number] Arrival date & time: 11/20/23  1458     Patient presents with: Shortness of Breath   [name] is a 78 y.o. female.  With past medical history of hyperlipidemia, hypertension, history of rheumatic fever, breast cancer on chemotherapy presenting to emergency room with primary complaint of bilateral lower extremity swelling and shortness of breath.  Patient reports she has had 3 chemo treatments with the last one being 20 days ago for her breast cancer.  Notes that about 2 or 3 days ago when she was walking very short distances she noted she was getting very winded and having to sit down.  She had trouble walking into the ER and going to the bathroom. Denies swelling. Sent by PCP which had elevated dimer, trop and bnp.     Shortness of Breath      Prior to Admission medications   Medication Sig Start Date End Date Taking? Authorizing Provider  acetaminophen  (TYLENOL ) 325 MG tablet Take 650 mg by mouth every 6 (six) hours as needed for moderate pain (pain score 4-6).    [provider]  amLODipine  (NORVASC ) 10 MG tablet Take 1 tablet (10 mg total) by mouth daily. For high blood pressure 11/20/23   Burns, Glade PARAS, MD  atorvastatin  (LIPITOR) 40 MG tablet Take 1 tablet (40 mg total) by mouth daily. 08/14/23   [name], MD  buPROPion  (WELLBUTRIN  XL) 150 MG 24 hr tablet TAKE 1 TABLET (150 MG TOTAL) BY MOUTH IN THE MORNING DAILY. 11/20/23   [name], MD  dexamethasone  (DECADRON ) 4 MG tablet Take 2 tabs by mouth 2 times daily starting day before chemo. Then take 2 tabs daily for 2 days starting day after chemo. Take with food. Patient not taking: Reported on 11/20/2023 09/12/23   Iruku, Praveena, MD  EPINEPHrine  0.3 mg/0.3 mL IJ SOAJ injection Inject 0.3 mg into the muscle as needed for anaphylaxis. 09/12/23   Iruku, Praveena, MD  hydrALAZINE  (APRESOLINE ) 100 MG tablet Take 1 tablet (100 mg  total) by mouth 3 (three) times daily. 11/20/23   [name], MD  labetalol  (NORMODYNE ) 200 MG tablet Take 2 tablets (400 mg total) by mouth 2 (two) times daily. 11/20/23   [name], MD  lidocaine -prilocaine  (EMLA ) cream Apply to affected area once 09/12/23   Iruku, Praveena, MD  omeprazole  (PRILOSEC) 40 MG capsule TAKE 1 CAPSULE BY MOUTH EVERY DAY 05/28/23   [name], MD  ondansetron  (ZOFRAN ) 8 MG tablet Take 1 tablet (8 mg total) by mouth every 8 (eight) hours as needed for nausea or vomiting. Start on the third day after chemotherapy. 09/12/23   Iruku, Praveena, MD  prochlorperazine  (COMPAZINE ) 10 MG tablet Take 1 tablet (10 mg total) by mouth every 6 (six) hours as needed for nausea or vomiting. 09/12/23   Iruku, Praveena, MD  spironolactone  (ALDACTONE ) 25 MG tablet Take 0.5 tablets (12.5 mg total) by mouth daily. 11/20/23   [name], MD  traMADol  (ULTRAM ) 50 MG tablet Take 1 tablet (50 mg total) by mouth every 6 (six) hours as needed for moderate pain (pain score 4-6) or severe pain (pain score 7-10). 09/18/23   Cornett, Debby, MD    Allergies: Benazepril hcl and Yellow dyes (non-tartrazine)    Review of Systems  Respiratory:  Positive for shortness of breath.     Updated Vital Signs BP (!) 231/107 (BP Location: Right Arm)  Pulse (!) 101   Temp 98.2 F (36.8 C) (Oral)   Resp 16   Ht 5' 4 (1.626 m)   Wt 69.9 kg   SpO2 93%   BMI 26.45 kg/m   Physical Exam Vitals and nursing note reviewed.  Constitutional:      General: She is not in acute distress.    Appearance: She is not toxic-appearing.  HENT:     Head: Normocephalic and atraumatic.  Eyes:     General: No scleral icterus.    Conjunctiva/sclera: Conjunctivae normal.  Cardiovascular:     Rate and Rhythm: Regular rhythm. Tachycardia present.     Pulses: Normal pulses.     Heart sounds: Normal heart sounds.  Pulmonary:     Effort: Pulmonary effort is normal. No respiratory distress.     Breath sounds:  Rales present.  Abdominal:     General: Abdomen is flat. Bowel sounds are normal.     Palpations: Abdomen is soft.     Tenderness: There is no abdominal tenderness.  Musculoskeletal:     Right lower leg: Edema present.     Left lower leg: Edema present.  Skin:    General: Skin is warm and dry.     Findings: No lesion.  Neurological:     General: No focal deficit present.     Mental Status: She is alert and oriented to person, place, and time. Mental status is at baseline.     (all labs ordered are listed, but only abnormal results are displayed) Labs Reviewed  BRAIN NATRIURETIC PEPTIDE - Abnormal; Notable for the following components:      Result Value   B Natriuretic Peptide 918.9 (*)    All other components within normal limits  CBC - Abnormal; Notable for the following components:   WBC 14.4 (*)    RBC 3.20 (*)    Hemoglobin 9.1 (*)    HCT 29.2 (*)    RDW 20.7 (*)    Platelets 409 (*)    All other components within normal limits  COMPREHENSIVE METABOLIC PANEL WITH GFR - Abnormal; Notable for the following components:   CO2 20 (*)    Glucose, Bld 107 (*)    Creatinine, Ser 1.04 (*)    Albumin 3.0 (*)    GFR, Estimated 56 (*)    All other components within normal limits  TROPONIN I (HIGH SENSITIVITY) - Abnormal; Notable for the following components:   Troponin I (High Sensitivity) 31 (*)    All other components within normal limits  TROPONIN I (HIGH SENSITIVITY) - Abnormal; Notable for the following components:   Troponin I (High Sensitivity) 31 (*)    All other components within normal limits    EKG: None  Radiology: DG Chest 2 View Result Date: 11/20/2023 EXAM: 2 VIEW(S) XRAY OF THE CHEST 11/20/2023 04:13:00 PM COMPARISON: PA and lateral radiographs of the chest dated 11/20/2023. CLINICAL HISTORY: SOB x 3-4 days ago, HTN, smoker. FINDINGS: LUNGS AND PLEURA: Mild fullness of the central pulmonary vasculature. Mildly prominent reticular opacities again seen  throughout the lungs, similar to the prior study. No pleural effusion. No pneumothorax. HEART AND MEDIASTINUM: The heart is normal in size. No acute abnormality of the cardiac and mediastinal silhouettes. BONES AND SOFT TISSUES: No acute osseous abnormality. The right internal jugular chest port remains in place. IMPRESSION: 1. Mildly prominent reticular opacities throughout the lungs, similar to the prior study. 2. Mild fullness of the central pulmonary vasculature. Electronically signed by: evalene coho 11/20/2023  06:18 PM EDT RP Workstation: [id number]   DG Chest 2 View Result Date: 11/20/2023 CLINICAL DATA:  DOE, acute. EXAM: CHEST - 2 VIEW COMPARISON:  05/04/2022. FINDINGS: Mild-to-moderate diffuse increased interstitial markings with bilateral hilar predominance. Bilateral lung fields are otherwise clear. No acute consolidation or lung collapse. There is subtle blunting of bilateral costophrenic angles, which may represent bilateral trace pleural effusions. Stable cardio-mediastinal silhouette. No acute osseous abnormalities. The soft tissues are within normal limits. A new right-sided CT Port-A-Cath is seen with its tip overlying the midportion of superior vena cava. IMPRESSION: *Findings favor congestive heart failure/pulmonary edema. Electronically Signed   By: Ree Molt M.D.   On: 11/20/2023 11:08     Procedures   Medications Ordered in the ED  furosemide  (LASIX ) injection 60 mg (has no administration in time range)  hydrALAZINE  (APRESOLINE ) tablet 100 mg (has no administration in time range)  labetalol  (NORMODYNE ) tablet 400 mg (has no administration in time range)                                    Medical Decision Making Amount and/or Complexity of Data Reviewed Radiology: ordered.  Risk OTC drugs. Prescription drug management.   This patient presents to the ED for concern of SOB, this involves an extensive number of treatment options, and is a complaint that carries  with it a high risk of complications and morbidity.  The differential diagnosis includes ACS, PE, CHF, pneumonia, pneumothorax   Co morbidities that complicate the patient evaluation  Breast cancer on chemotherapy   Additional history obtained:  Additional history obtained from office visit earlier today in which patient had abnormal labs, shortness of breath hypertension and sent here.   Lab Tests:  I personally interpreted labs.  The pertinent results include:   CBC with mild leukocytosis at 14.  Hemoglobin is 9.1 about baseline for patient. CMP without significant electrolyte abnormality.  Creatinine at baseline 1.04 Troponins flat 31, 31 BNP 918   Imaging Studies ordered:  I ordered imaging studies including chest x-ray I independently visualized and interpreted imaging which showed central vascular congestion Obtaining CT angio to rule out pulmonary embolism.  Pending at time of sign out.    Cardiac Monitoring: / EKG:  The patient was maintained on a cardiac monitor.  I personally viewed and interpreted the cardiac monitored which showed an underlying rhythm of: normal sinus     Problem List / ED Course / Critical interventions / Medication management  Patient presents with exertional shortness of breath and orthopnea associated bilateral lower extremity swelling for approximately 4 days.  Patient reports she can only walk very short distances without having to sit down and rest.  She has no prior history of congestive heart failure.  Of note she is currently being treated for breast cancer with chemotherapy.  She has no history of DVT or PE but did have elevated D-dimer.  Obtaining CTA to rule out pulmonary embolism.  Her blood pressure is elevated which is likely causing troponin leak but Trop is flat here.  Will give blood pressure medicine and recheck.  With elevated troponin, signs of fluid overload on exam and chest x-ray also given 60 of Lasix . I ordered medication  including Lasix , home blood pressure medicine hydralazine  and labetalol . Reevaluation of the patient after these medicines showed that the patient stayed the same I have reviewed the patients home medicines and have made adjustments  as needed Patient ultimately needs to be admitted for suspected new onset congestive heart failure.  Would benefit from having echo.  Also still waiting PE study.       Final diagnoses:  Acute congestive heart failure, unspecified heart failure type [facility])  Hypertensive emergency    ED Discharge Orders     None          Chigozie Basaldua N, PA-C 11/20/23 2331    Albertina Dixon, MD 11/21/23 [phone]

## 2023-11-20 NOTE — Telephone Encounter (Signed)
 Copied from CRM #[id number]. Topic: Clinical - Medical Advice >> Nov 20, 2023  2:02 PM [name] wrote: Reason for CRM: Patient stated that her nearest hospital is in [place] and she will not go to that one. Please call patient back. She stated that the EMR in [place] stated that they can't take her to [place]. They can take her to [place] or [place]. She did speak with sister she may possibly take her. Please call patient.

## 2023-11-20 NOTE — Telephone Encounter (Signed)
 CRITICAL VALUE STICKER  CRITICAL VALUE: Troponin 31  RECEIVER (on-site recipient of call): Ronnald  DATE & TIME NOTIFIED: 11/20/23 @ 12:20 pm  MESSENGER (representative from lab): Harlene  MD NOTIFIED: Yes  TIME OF NOTIFICATION: 12:22 pm

## 2023-11-20 NOTE — Assessment & Plan Note (Signed)
 Acute Started 2 days ago Concern for pulmonary embolism, pneumonia, heart failure Chest x-ray BNP, BMP, D-dimer, troponin EKG-sinus rhythm with occasional PVCs at 94 bpm, probable LAE.  LAE new and sinus bradycardia no longer present from prior EKG earlier this year Depending on above results we will order additional blood work, change medications or refer to ED

## 2023-11-20 NOTE — Telephone Encounter (Signed)
 Spoke with patient today and info given.  She will try to find ride to come back to [facility].  Informed her to go to [facility] on [address] and not [place] as they were the preferred choice for heart conditions.   Patient voiced understanding and will find someone to take her to [facility] ER.

## 2023-11-20 NOTE — Assessment & Plan Note (Addendum)
 Chronic Blood pressure not controlled Currently taking labetalol  300 mg twice daily and hydralazine  75 mg 3 times daily She is not positive if she has been taking the amlodipine  10 mg daily-she thinks she has been maybe ran out 2 days ago, but is not sure Increase hydralazine  to 100 mg 3 times daily, increase labetalol  to 400 mg twice daily and continue/restart amlodipine  10 mg daily Add in spironolactone  12.5 mg daily-Will need to watch GFR, potassium closely

## 2023-11-20 NOTE — ED Triage Notes (Signed)
 Pt bib POV c/o sob r/t heart failure. Pt went to the provider that recommended pt to be seen in ER.  Pt has had sob for the past three days. Pt denies cp.

## 2023-11-20 NOTE — ED Provider Notes (Signed)
 Signout Kimberly Shad, PA-C, in short patient is a 78 year old female with history of hyperlipidemia, hypertension, breast cancer on chemotherapy presents with complaints of bilateral lower extremity swelling and shortness of breath.  Not associate with any chest pain.  Was evaluated by her PCP was referred here after being found to have elevated dimer, troponin and BNP.  She has no history of CHF.  Today's labs are notable for troponin of 31, repeat is flat, BNP elevated to 918, mildly elevated creatinine, CBC is notable for leukocytosis, however she is afebrile, no evidence of pneumonia on imaging.  At time of signout CT PE study is pending.    Physical Exam  BP (!) 229/92   Pulse (!) 114   Temp 98.2 F (36.8 C) (Oral)   Resp 16   Ht 5' 4 (1.626 m)   Wt 69.9 kg   SpO2 93%   BMI 26.45 kg/m   Physical Exam Vitals and nursing note reviewed.  Constitutional:      General: She is not in acute distress.    Appearance: She is well-developed.  HENT:     Head: Normocephalic and atraumatic.  Eyes:     Conjunctiva/sclera: Conjunctivae normal.  Cardiovascular:     Rate and Rhythm: Normal rate and regular rhythm.     Heart sounds: No murmur heard. Pulmonary:     Effort: Pulmonary effort is normal. No respiratory distress.     Comments: Bibasilar Rales without wheezing Abdominal:     Palpations: Abdomen is soft.     Tenderness: There is no abdominal tenderness.  Musculoskeletal:        General: Swelling present.     Cervical back: Neck supple.     Comments: 1+ pitting edema bilaterally, tenderness to right calf  Skin:    General: Skin is warm and dry.     Capillary Refill: Capillary refill takes less than 2 seconds.  Neurological:     Mental Status: She is alert.  Psychiatric:        Mood and Affect: Mood normal.     Procedures  Procedures  ED Course / MDM    Medical Decision Making Amount and/or Complexity of Data Reviewed Radiology: ordered.  Risk OTC  drugs. Prescription drug management. Decision regarding hospitalization.   PE study negative.  Overall workup is most consistent with acute decompensated heart failure.  Patient noted to be hypertensive with systolics in the 200 upon arrival.  Reports compliance with her blood pressure medication although there have been adjustments recently.  Blood pressure appears to be improved at this time with systolics in the 160s now.  She is not requiring oxygen.  Will pursue hospitalist admission. Dr. Karmen agreed for admission.      Kimberly Lynwood DEL, PA-C 11/21/23 0150    Kimberly Fonda MATSU, MD 11/21/23 [phone]

## 2023-11-20 NOTE — Telephone Encounter (Signed)
 Please call her-her chest x-ray shows possible heart failure.  This would explain her shortness of breath.  One of her blood tests-the troponin is elevated.  This can sometimes indicate a heart attack, but may also just be related to the heart strain, heart failure.  Some of her other blood work is still pending, but given these findings she needs to go to the emergency room to be evaluated.  Ideally she should go to [facility] emergency room.  She can let them know that we sent her there and they can see what her blood work and chest x-ray look like and they will get her evaluated quickly because of concerns with the heart.

## 2023-11-20 NOTE — Telephone Encounter (Signed)
 Patient made it to [facility] and is currently being worked up now.

## 2023-11-20 NOTE — ED Notes (Signed)
 Pt doctor visit revealed HF on xray and elevated troponin.

## 2023-11-20 NOTE — ED Provider Triage Note (Addendum)
 Emergency Medicine Provider Triage Evaluation Note  [name] , a 78 y.o. female  was evaluated in triage.  Pt complains of exertional dyspnea for 4 days but states I feel fine she is a breast cancer pt who has infusions at [facility].   Was seen at PCP office today and had high BP. Had meds increased today (has ppw with her).   No hemoptysis. No fever or chills.   She's uncertain if she took her amlodipine  and she tells me she may have been taking some meds at different times.   No headache. No SOB at rest.   Review of Systems  Positive: SOB w exertion Negative: Fever   Physical Exam  BP (!) 235/103 (BP Location: Right Arm)   Pulse 96   Temp 98.8 F (37.1 C)   Resp 17   SpO2 90%  Gen:   Awake, no distress   Resp:  Normal effort  MSK:   Moves extremities without difficulty  Other:  No crackles, speaking in full sentences. Cardiac murmur +  Medical Decision Making  Medically screening exam initiated at 3:18 PM.  Appropriate orders placed.  [name] was informed that the remainder of the evaluation will be completed by another provider, this initial triage assessment does not replace that evaluation, and the importance of remaining in the ED until their evaluation is complete.  Today for BP pt took hydralazine  and amlodipine  but has not yet taken labetalol .    Neldon Hamp RAMAN, [place] 11/20/23 1522    Neldon Hamp RAMAN, [place] 11/20/23 1523

## 2023-11-21 ENCOUNTER — Inpatient Hospital Stay (HOSPITAL_COMMUNITY)

## 2023-11-21 ENCOUNTER — Encounter (HOSPITAL_COMMUNITY): Payer: Self-pay | Admitting: Internal Medicine

## 2023-11-21 ENCOUNTER — Inpatient Hospital Stay

## 2023-11-21 ENCOUNTER — Inpatient Hospital Stay: Admitting: Hematology and Oncology

## 2023-11-21 ENCOUNTER — Ambulatory Visit

## 2023-11-21 DIAGNOSIS — D630 Anemia in neoplastic disease: Secondary | ICD-10-CM | POA: Diagnosis not present

## 2023-11-21 DIAGNOSIS — T380X5A Adverse effect of glucocorticoids and synthetic analogues, initial encounter: Secondary | ICD-10-CM | POA: Diagnosis not present

## 2023-11-21 DIAGNOSIS — Z923 Personal history of irradiation: Secondary | ICD-10-CM | POA: Diagnosis not present

## 2023-11-21 DIAGNOSIS — M79662 Pain in left lower leg: Secondary | ICD-10-CM

## 2023-11-21 DIAGNOSIS — I509 Heart failure, unspecified: Secondary | ICD-10-CM

## 2023-11-21 DIAGNOSIS — M79661 Pain in right lower leg: Secondary | ICD-10-CM

## 2023-11-21 DIAGNOSIS — N179 Acute kidney failure, unspecified: Secondary | ICD-10-CM | POA: Diagnosis not present

## 2023-11-21 DIAGNOSIS — C50911 Malignant neoplasm of unspecified site of right female breast: Secondary | ICD-10-CM | POA: Diagnosis not present

## 2023-11-21 DIAGNOSIS — D6481 Anemia due to antineoplastic chemotherapy: Secondary | ICD-10-CM | POA: Diagnosis not present

## 2023-11-21 DIAGNOSIS — Z8249 Family history of ischemic heart disease and other diseases of the circulatory system: Secondary | ICD-10-CM | POA: Diagnosis not present

## 2023-11-21 DIAGNOSIS — I739 Peripheral vascular disease, unspecified: Secondary | ICD-10-CM | POA: Diagnosis not present

## 2023-11-21 DIAGNOSIS — Z886 Allergy status to analgesic agent status: Secondary | ICD-10-CM | POA: Diagnosis not present

## 2023-11-21 DIAGNOSIS — Z9102 Food additives allergy status: Secondary | ICD-10-CM | POA: Diagnosis not present

## 2023-11-21 DIAGNOSIS — I7090 Unspecified atherosclerosis: Secondary | ICD-10-CM

## 2023-11-21 DIAGNOSIS — Z888 Allergy status to other drugs, medicaments and biological substances status: Secondary | ICD-10-CM | POA: Diagnosis not present

## 2023-11-21 DIAGNOSIS — M7989 Other specified soft tissue disorders: Secondary | ICD-10-CM

## 2023-11-21 DIAGNOSIS — R590 Localized enlarged lymph nodes: Secondary | ICD-10-CM | POA: Diagnosis not present

## 2023-11-21 DIAGNOSIS — M25571 Pain in right ankle and joints of right foot: Secondary | ICD-10-CM | POA: Diagnosis not present

## 2023-11-21 DIAGNOSIS — Y92009 Unspecified place in unspecified non-institutional (private) residence as the place of occurrence of the external cause: Secondary | ICD-10-CM | POA: Diagnosis not present

## 2023-11-21 DIAGNOSIS — I161 Hypertensive emergency: Secondary | ICD-10-CM

## 2023-11-21 DIAGNOSIS — I771 Stricture of artery: Secondary | ICD-10-CM | POA: Diagnosis not present

## 2023-11-21 DIAGNOSIS — E785 Hyperlipidemia, unspecified: Secondary | ICD-10-CM | POA: Diagnosis not present

## 2023-11-21 DIAGNOSIS — M7731 Calcaneal spur, right foot: Secondary | ICD-10-CM | POA: Diagnosis not present

## 2023-11-21 DIAGNOSIS — F411 Generalized anxiety disorder: Secondary | ICD-10-CM | POA: Diagnosis not present

## 2023-11-21 DIAGNOSIS — M19071 Primary osteoarthritis, right ankle and foot: Secondary | ICD-10-CM | POA: Diagnosis not present

## 2023-11-21 DIAGNOSIS — Z853 Personal history of malignant neoplasm of breast: Secondary | ICD-10-CM

## 2023-11-21 DIAGNOSIS — I10 Essential (primary) hypertension: Secondary | ICD-10-CM

## 2023-11-21 DIAGNOSIS — R0602 Shortness of breath: Secondary | ICD-10-CM | POA: Diagnosis not present

## 2023-11-21 DIAGNOSIS — M109 Gout, unspecified: Secondary | ICD-10-CM | POA: Diagnosis not present

## 2023-11-21 DIAGNOSIS — Z79899 Other long term (current) drug therapy: Secondary | ICD-10-CM | POA: Diagnosis not present

## 2023-11-21 DIAGNOSIS — D649 Anemia, unspecified: Secondary | ICD-10-CM | POA: Diagnosis not present

## 2023-11-21 DIAGNOSIS — I130 Hypertensive heart and chronic kidney disease with heart failure and stage 1 through stage 4 chronic kidney disease, or unspecified chronic kidney disease: Secondary | ICD-10-CM | POA: Diagnosis not present

## 2023-11-21 DIAGNOSIS — T451X5A Adverse effect of antineoplastic and immunosuppressive drugs, initial encounter: Secondary | ICD-10-CM | POA: Diagnosis not present

## 2023-11-21 DIAGNOSIS — N1832 Chronic kidney disease, stage 3b: Secondary | ICD-10-CM | POA: Diagnosis not present

## 2023-11-21 DIAGNOSIS — N281 Cyst of kidney, acquired: Secondary | ICD-10-CM | POA: Diagnosis not present

## 2023-11-21 DIAGNOSIS — N178 Other acute kidney failure: Secondary | ICD-10-CM | POA: Diagnosis not present

## 2023-11-21 DIAGNOSIS — I5033 Acute on chronic diastolic (congestive) heart failure: Secondary | ICD-10-CM

## 2023-11-21 DIAGNOSIS — Z87891 Personal history of nicotine dependence: Secondary | ICD-10-CM | POA: Diagnosis not present

## 2023-11-21 DIAGNOSIS — R7303 Prediabetes: Secondary | ICD-10-CM | POA: Diagnosis not present

## 2023-11-21 LAB — COMPREHENSIVE METABOLIC PANEL WITH GFR
ALT: 10 U/L (ref 0–44)
AST: 14 U/L — ABNORMAL LOW (ref 15–41)
Albumin: 2.5 g/dL — ABNORMAL LOW (ref 3.5–5.0)
Alkaline Phosphatase: 70 U/L (ref 38–126)
Anion gap: 9 (ref 5–15)
BUN: 13 mg/dL (ref 8–23)
CO2: 21 mmol/L — ABNORMAL LOW (ref 22–32)
Calcium: 8.4 mg/dL — ABNORMAL LOW (ref 8.9–10.3)
Chloride: 110 mmol/L (ref 98–111)
Creatinine, Ser: 1.21 mg/dL — ABNORMAL HIGH (ref 0.44–1.00)
GFR, Estimated: 46 mL/min — ABNORMAL LOW (ref >60)
Glucose, Bld: 116 mg/dL — ABNORMAL HIGH (ref 70–99)
Potassium: 3.5 mmol/L (ref 3.5–5.1)
Sodium: 140 mmol/L (ref 135–145)
Total Bilirubin: 0.8 mg/dL (ref 0.0–1.2)
Total Protein: 6.3 g/dL — ABNORMAL LOW (ref 6.5–8.1)

## 2023-11-21 LAB — URIC ACID: Uric Acid, Serum: 9.6 mg/dL — ABNORMAL HIGH (ref 2.5–7.1)

## 2023-11-21 LAB — RETICULOCYTES
Immature Retic Fract: 11.9 % (ref 2.3–15.9)
RBC.: 2.62 MIL/uL — ABNORMAL LOW (ref 3.87–5.11)
Retic Count, Absolute: 78.3 K/uL (ref 19.0–186.0)
Retic Ct Pct: 3 % (ref 0.4–3.1)

## 2023-11-21 LAB — CBC
HCT: 24.8 % — ABNORMAL LOW (ref 36.0–46.0)
Hemoglobin: 7.6 g/dL — ABNORMAL LOW (ref 12.0–15.0)
MCH: 28.1 pg (ref 26.0–34.0)
MCHC: 30.6 g/dL (ref 30.0–36.0)
MCV: 91.9 fL (ref 80.0–100.0)
Platelets: 377 K/uL (ref 150–400)
RBC: 2.7 MIL/uL — ABNORMAL LOW (ref 3.87–5.11)
RDW: 20.5 % — ABNORMAL HIGH (ref 11.5–15.5)
WBC: 16.3 K/uL — ABNORMAL HIGH (ref 4.0–10.5)
nRBC: 0 % (ref 0.0–0.2)

## 2023-11-21 LAB — ECHOCARDIOGRAM COMPLETE
Area-P 1/2: 3.37 cm2
Height: 64 in
S' Lateral: 3.4 cm
Weight: 2465.62 [oz_av]

## 2023-11-21 LAB — IRON AND TIBC
Iron: 8 ug/dL — ABNORMAL LOW (ref 28–170)
Saturation Ratios: 4 % — ABNORMAL LOW (ref 10.4–31.8)
TIBC: 204 ug/dL — ABNORMAL LOW (ref 250–450)
UIBC: 196 ug/dL

## 2023-11-21 LAB — CBG MONITORING, ED
Glucose-Capillary: 149 mg/dL — ABNORMAL HIGH (ref 70–99)
Glucose-Capillary: 116 mg/dL — ABNORMAL HIGH (ref 70–99)
Glucose-Capillary: 117 mg/dL — ABNORMAL HIGH (ref 70–99)
Glucose-Capillary: 113 mg/dL — ABNORMAL HIGH (ref 70–99)
Glucose-Capillary: 124 mg/dL — ABNORMAL HIGH (ref 70–99)

## 2023-11-21 LAB — VITAMIN B12: Vitamin B-12: 4316 pg/mL — ABNORMAL HIGH (ref 180–914)

## 2023-11-21 LAB — FERRITIN: Ferritin: 200 ng/mL (ref 11–307)

## 2023-11-21 LAB — SEDIMENTATION RATE: Sed Rate: 90 mm/h — ABNORMAL HIGH (ref 0–22)

## 2023-11-21 LAB — FOLATE: Folate: 9.6 ng/mL (ref >5.9)

## 2023-11-21 MED ORDER — PANTOPRAZOLE SODIUM 40 MG PO TBEC
40.0000 mg | DELAYED_RELEASE_TABLET | Freq: Every day | ORAL | Status: DC
  Administered 2023-11-21 – 2023-11-26 (×5): 40 mg via ORAL
  Filled 2023-11-21 (×6): qty 1

## 2023-11-21 MED ORDER — INSULIN ASPART 100 UNIT/ML IJ SOLN
0.0000 [IU] | Freq: Three times a day (TID) | INTRAMUSCULAR | Status: DC
  Administered 2023-11-22 – 2023-11-25 (×2): 1 [IU] via SUBCUTANEOUS

## 2023-11-21 MED ORDER — BUPROPION HCL ER (XL) 150 MG PO TB24
150.0000 mg | ORAL_TABLET | Freq: Every day | ORAL | Status: DC
  Administered 2023-11-21 – 2023-11-26 (×6): 150 mg via ORAL
  Filled 2023-11-21 (×6): qty 1

## 2023-11-21 MED ORDER — SODIUM CHLORIDE 0.9% FLUSH
3.0000 mL | Freq: Two times a day (BID) | INTRAVENOUS | Status: DC
  Administered 2023-11-21 – 2023-11-26 (×5): 3 mL via INTRAVENOUS

## 2023-11-21 MED ORDER — MORPHINE SULFATE (PF) 2 MG/ML IV SOLN
2.0000 mg | INTRAVENOUS | Status: DC | PRN
  Administered 2023-11-21 (×2): 2 mg via INTRAVENOUS
  Filled 2023-11-21 (×2): qty 1

## 2023-11-21 MED ORDER — ONDANSETRON HCL 4 MG PO TABS: 4.0000 mg | ORAL_TABLET | Freq: Four times a day (QID) | ORAL | Status: DC | PRN

## 2023-11-21 MED ORDER — SPIRONOLACTONE 12.5 MG HALF TABLET
12.5000 mg | ORAL_TABLET | Freq: Every day | ORAL | Status: DC
  Administered 2023-11-21: 12.5 mg via ORAL
  Filled 2023-11-21: qty 1

## 2023-11-21 MED ORDER — SODIUM CHLORIDE 0.9 % IV SOLN: 250.0000 mL | INTRAVENOUS | Status: AC | PRN

## 2023-11-21 MED ORDER — ACETAMINOPHEN 325 MG PO TABS
650.0000 mg | ORAL_TABLET | Freq: Four times a day (QID) | ORAL | Status: DC | PRN
  Administered 2023-11-21: 650 mg via ORAL
  Filled 2023-11-21: qty 2

## 2023-11-21 MED ORDER — AMLODIPINE BESYLATE 10 MG PO TABS
10.0000 mg | ORAL_TABLET | Freq: Every day | ORAL | Status: DC
  Administered 2023-11-21 – 2023-11-23 (×3): 10 mg via ORAL
  Filled 2023-11-21: qty 2
  Filled 2023-11-21: qty 1
  Filled 2023-11-21: qty 2

## 2023-11-21 MED ORDER — FUROSEMIDE 10 MG/ML IJ SOLN
20.0000 mg | Freq: Two times a day (BID) | INTRAMUSCULAR | Status: DC
  Administered 2023-11-21: 20 mg via INTRAVENOUS
  Filled 2023-11-21: qty 2

## 2023-11-21 MED ORDER — SODIUM CHLORIDE 0.9% FLUSH: 3.0000 mL | INTRAVENOUS | Status: DC | PRN

## 2023-11-21 MED ORDER — INSULIN ASPART 100 UNIT/ML IJ SOLN: 0.0000 [IU] | Freq: Every day | INTRAMUSCULAR | Status: DC

## 2023-11-21 MED ORDER — ACETAMINOPHEN 650 MG RE SUPP: 650.0000 mg | Freq: Four times a day (QID) | RECTAL | Status: DC | PRN

## 2023-11-21 MED ORDER — FUROSEMIDE 10 MG/ML IJ SOLN
40.0000 mg | Freq: Two times a day (BID) | INTRAMUSCULAR | Status: DC
  Administered 2023-11-21: 40 mg via INTRAVENOUS
  Filled 2023-11-21 (×2): qty 4

## 2023-11-21 MED ORDER — SPIRONOLACTONE 25 MG PO TABS
25.0000 mg | ORAL_TABLET | Freq: Every day | ORAL | Status: DC
Start: 2023-11-22 — End: 2023-11-22
  Filled 2023-11-21: qty 1

## 2023-11-21 MED ORDER — ONDANSETRON HCL 4 MG/2ML IJ SOLN
4.0000 mg | Freq: Four times a day (QID) | INTRAMUSCULAR | Status: DC | PRN
  Administered 2023-11-21: 4 mg via INTRAVENOUS
  Filled 2023-11-21: qty 2

## 2023-11-21 MED ORDER — HEPARIN SODIUM (PORCINE) 5000 UNIT/ML IJ SOLN
5000.0000 [IU] | Freq: Three times a day (TID) | INTRAMUSCULAR | Status: DC
  Administered 2023-11-21 – 2023-11-22 (×3): 5000 [IU] via SUBCUTANEOUS
  Filled 2023-11-21 (×3): qty 1

## 2023-11-21 MED ORDER — ATORVASTATIN CALCIUM 40 MG PO TABS
40.0000 mg | ORAL_TABLET | Freq: Every day | ORAL | Status: DC
  Administered 2023-11-21 – 2023-11-26 (×6): 40 mg via ORAL
  Filled 2023-11-21 (×6): qty 1

## 2023-11-21 MED ORDER — ACETAMINOPHEN 500 MG PO TABS
1000.0000 mg | ORAL_TABLET | Freq: Once | ORAL | Status: AC
  Administered 2023-11-21: 1000 mg via ORAL
  Filled 2023-11-21: qty 2

## 2023-11-21 NOTE — ED Notes (Signed)
 Patient transported to vascular.

## 2023-11-21 NOTE — Progress Notes (Addendum)
 [name]   DOB:[birth year]   FM#:[id number]      ASSESSMENT & PLAN:  [name] is a 78 year old female patient with oncologic history significant for left breast cancer.  She came to ED on 11/20/2023 due to shortness of breath and right lower extremity swelling with ankle pain.  Shortness of breath, progressive Lower ext swelling Right pleural effusion, small -- Patient complains of shortness of breath x3 days with LE swelling.  Went to see PCP and was advised to come to ED. -- CT angio done 11/14/2023 shows no evidence of pulmonary emboli. Does show multiple pulmonary nodules. Also shows small right pleural effusion --Dopplers done, no evidence of DVT bilateral lower extremities.  --Consideration for Pulm and cardiac evals --Monitor respiratory status.  Left breast Neoplasm, Triple Negative -- Diagnosed 08/26/2023 with Stage IIB (cT2, cN0,cM0, G3, ER-PR-HER2-) -- Started on chemotherapy with taxotere  + cytoxan  every 21 days, started 09/19/2023. Status post 3 cycles. - C4 was due 8/14, postponed due to this hospitalization.    -- MR breast done 09/11/23 shows necrotic 3.3 cm mass in lower inner quadrant of left breast; no malignancy right breast.  -- Treatment plan includes neoadjuvant chemo regimen followed by surgery/mastectomy, due to prior radiation therapy.  -- Medical Oncology/Dr. Loretha following closely.    Anemia History of B12 deficiency -- Likely secondary to recent chemotherapy + malignancy -- Hemoglobin low 7.6 -- Recommend PRBC transfusion for HGB <7.0 -- B12 level pending, last B12 level one month ago elevated 1186 likely due to previous repletion. -- Monitor CBC with differential  Hypertension -- BP extremely elevated in ED, improved now 143/50 -- On antihypertensive meds, continue as ordered -- Monitor blood pressure closely  Code Status Full   Subjective:  Patient seen resting comfortably in ED.  Awakens easily to verbal stimuli.  Confirms that she has had  increasing shortness of breath for few days with right ankle pain that became progressively worse yesterday.  Expresses sadness over missing chemo today.  No other acute complaints offered.  Objective:  No intake or output data in the 24 hours ending 11/21/23 1400   PHYSICAL EXAMINATION: ECOG PERFORMANCE STATUS: 3 - Symptomatic, >50% confined to bed  Vitals:   11/21/23 1300 11/21/23 1302  BP: (!) 143/50   Pulse: 77   Resp: (!) 27   Temp:  98.8 F (37.1 C)  SpO2: 97%    Filed Weights   11/20/23 1528  Weight: 154 lb 1.6 oz (69.9 kg)    GENERAL: alert, no distress and comfortable SKIN: skin color, texture, turgor are normal, no rashes or significant lesions EYES: normal, conjunctiva are pink and non-injected, sclera clear OROPHARYNX: no exudate, no erythema and lips, buccal mucosa, and tongue normal  NECK: supple, thyroid  normal size, non-tender, without nodularity LYMPH: no palpable lymphadenopathy in the cervical, axillary or inguinal LUNGS: clear to auscultation and percussion with normal breathing effort HEART: regular rate & rhythm and no murmurs  + lower extremity edema 1+ ABDOMEN: abdomen soft, non-tender and normal bowel sounds MUSCULOSKELETAL: no cyanosis of digits and no clubbing  PSYCH: alert & oriented x 3 with fluent speech NEURO: no focal motor/sensory deficits   All questions were answered. The patient knows to call the clinic with any problems, questions or concerns.   The total time spent in the appointment was 40 minutes encounter with patient including review of chart and various tests results, discussions about plan of care and coordination of care plan  Olam JINNY Brunner, NP  11/21/2023 2:00 PM    Labs Reviewed:  Lab Results  Component Value Date   WBC 16.3 (H) 11/21/2023   HGB 7.6 (L) 11/21/2023   HCT 24.8 (L) 11/21/2023   MCV 91.9 11/21/2023   PLT 377 11/21/2023   Recent Labs    10/31/23 0937 11/20/23 1103 11/20/23 1533 11/21/23 0608  NA 141  141 141 140  K 4.2 3.7 3.6 3.5  CL 109 107 110 110  CO2 26 21 20* 21*  GLUCOSE 92 111* 107* 116*  BUN 21 12 12 13   CREATININE 1.49* 0.94 1.04* 1.21*  CALCIUM  8.8* 9.2 8.9 8.4*  GFRNONAA 36*  --  56* 46*  PROT 6.7  --  7.0 6.3*  ALBUMIN 3.5  --  3.0* 2.5*  AST 15  --  16 14*  ALT 13  --  11 10  ALKPHOS 66  --  100 70  BILITOT 0.5  --  1.0 0.8    Studies Reviewed:  VAS US  ABI WITH/WO TBI Result Date: 11/21/2023  LOWER EXTREMITY DOPPLER STUDY Patient Name:  [name]  Date of Exam:   11/21/2023 Medical Rec #: [id number]     Accession #:    [id number] Date of Birth: 1946-01-30     Patient Gender: F Patient Age:   11 years Exam Location:  [facility] Procedure:      VAS US  ABI WITH/WO TBI Referring Phys: MICAELA SPEAKER --------------------------------------------------------------------------------  Indications: Claudication. High Risk Factors: Hypertension, hyperlipidemia, current smoker. Other Factors: CHF, carotid stenosis, CKD.  Comparison Study: No previous exams Performing Technologist: Leigh Rom RVT/RDMS  Examination Guidelines: A complete evaluation includes at minimum, Doppler waveform signals and systolic blood pressure reading at the level of bilateral brachial, anterior tibial, and posterior tibial arteries, when vessel segments are accessible. Bilateral testing is considered an integral part of a complete examination. Photoelectric Plethysmograph (PPG) waveforms and toe systolic pressure readings are included as required and additional duplex testing as needed. Limited examinations for reoccurring indications may be performed as noted.  ABI Findings: +---------+------------------+-----+----------+--------+ Right    Rt Pressure (mmHg)IndexWaveform  Comment  +---------+------------------+-----+----------+--------+ Brachial 189                    triphasic          +---------+------------------+-----+----------+--------+ PTA      146               0.77 monophasic          +---------+------------------+-----+----------+--------+ DP       140               0.74 monophasic         +---------+------------------+-----+----------+--------+ Great Toe103               0.54 Abnormal           +---------+------------------+-----+----------+--------+ +---------+------------------+-----+---------+--------------------------------+ Left     Lt Pressure (mmHg)IndexWaveform Comment                          +---------+------------------+-----+---------+--------------------------------+ Brachial                        triphasicHx on breast cancer - no  pressures                        +---------+------------------+-----+---------+--------------------------------+ PTA      153               0.81 biphasic                                  +---------+------------------+-----+---------+--------------------------------+ DP       159               0.84 biphasic                                  +---------+------------------+-----+---------+--------------------------------+ Great Toe116               0.61 Abnormal                                  +---------+------------------+-----+---------+--------------------------------+  Summary: Right: Resting right ankle-brachial index indicates moderate right lower extremity arterial disease. The right toe-brachial index is abnormal. Left: Resting left ankle-brachial index indicates mild left lower extremity arterial disease. The left toe-brachial index is abnormal. *See table(s) above for measurements and observations.     Preliminary    VAS US  LOWER EXTREMITY VENOUS (DVT) Result Date: 11/21/2023  Lower Venous DVT Study Patient Name:  [name]  Date of Exam:   11/21/2023 Medical Rec #: [id number]     Accession #:    [id number] Date of Birth: 09/27/1945     Patient Gender: F Patient Age:   88 years Exam Location:  [facility] [place] Procedure:      VAS US  LOWER  EXTREMITY VENOUS (DVT) Referring Phys: MICAELA SUNDIL --------------------------------------------------------------------------------  Indications: Pain, and Swelling.  Risk Factors: Breast cancer, chemotherapy. Comparison Study: RLEV on 11/10/2008 was negative for DVT Performing Technologist: Ezzie Potters RVT, RDMS  Examination Guidelines: A complete evaluation includes B-mode imaging, spectral Doppler, color Doppler, and power Doppler as needed of all accessible portions of each vessel. Bilateral testing is considered an integral part of a complete examination. Limited examinations for reoccurring indications may be performed as noted. The reflux portion of the exam is performed with the patient in reverse Trendelenburg.  +---------+---------------+---------+-----------+----------+--------------+ RIGHT    CompressibilityPhasicitySpontaneityPropertiesThrombus Aging +---------+---------------+---------+-----------+----------+--------------+ CFV      Full           Yes      Yes                                 +---------+---------------+---------+-----------+----------+--------------+ SFJ      Full                                                        +---------+---------------+---------+-----------+----------+--------------+ FV Prox  Full           Yes      Yes                                 +---------+---------------+---------+-----------+----------+--------------+ FV Mid   Full  Yes      Yes                                 +---------+---------------+---------+-----------+----------+--------------+ FV DistalFull           Yes      Yes                                 +---------+---------------+---------+-----------+----------+--------------+ PFV      Full                                                        +---------+---------------+---------+-----------+----------+--------------+ POP      Full           Yes      Yes                                  +---------+---------------+---------+-----------+----------+--------------+ PTV      Full                                                        +---------+---------------+---------+-----------+----------+--------------+ PERO     Full                                                        +---------+---------------+---------+-----------+----------+--------------+   +---------+---------------+---------+-----------+----------+--------------+ LEFT     CompressibilityPhasicitySpontaneityPropertiesThrombus Aging +---------+---------------+---------+-----------+----------+--------------+ CFV      Full           Yes      Yes                                 +---------+---------------+---------+-----------+----------+--------------+ SFJ      Full                                                        +---------+---------------+---------+-----------+----------+--------------+ FV Prox  Full           Yes      Yes                                 +---------+---------------+---------+-----------+----------+--------------+ FV Mid   Full           Yes      Yes                                 +---------+---------------+---------+-----------+----------+--------------+ FV DistalFull           Yes      Yes                                 +---------+---------------+---------+-----------+----------+--------------+  PFV      Full                                                        +---------+---------------+---------+-----------+----------+--------------+ POP      Full           Yes      Yes                                 +---------+---------------+---------+-----------+----------+--------------+ PTV      Full                                                        +---------+---------------+---------+-----------+----------+--------------+ PERO     Full                                                         +---------+---------------+---------+-----------+----------+--------------+     Summary: BILATERAL: - No evidence of deep vein thrombosis seen in the lower extremities, bilaterally. -No evidence of popliteal cyst, bilaterally. - Ultrasound characteristics of enlarged lymph nodes are noted in the groin, bilaterally.   *See table(s) above for measurements and observations.    Preliminary    DG Ankle 2 Views Right Result Date: 11/21/2023 CLINICAL DATA:  Right ankle pain EXAM: RIGHT ANKLE - 2 VIEW COMPARISON:  01/21/2009 FINDINGS: No acute fracture or dislocation is noted. No soft tissue changes are seen. Calcaneal spurring is noted. IMPRESSION: Degenerative change without acute abnormality. Electronically Signed   By: Oneil Devonshire M.D.   On: 11/21/2023 02:52   CT Angio Chest PE W and/or Wo Contrast Result Date: 11/21/2023 CLINICAL DATA:  Positive D-dimer and shortness of breath EXAM: CT ANGIOGRAPHY CHEST WITH CONTRAST TECHNIQUE: Multidetector CT imaging of the chest was performed using the standard protocol during bolus administration of intravenous contrast. Multiplanar CT image reconstructions and MIPs were obtained to evaluate the vascular anatomy. RADIATION DOSE REDUCTION: This exam was performed according to the departmental dose-optimization program which includes automated exposure control, adjustment of the mA and/or kV according to patient size and/or use of iterative reconstruction technique. CONTRAST:  50mL OMNIPAQUE  IOHEXOL  350 MG/ML SOLN COMPARISON:  Chest x-ray from earlier in the same day. FINDINGS: Cardiovascular: Atherosclerotic calcifications of the thoracic aorta are noted. No aneurysmal dilatation is seen. Heart is mildly enlarged in size. The pulmonary artery shows a normal branching pattern without evidence of intraluminal filling defect to suggest pulmonary embolism. Right chest wall port is noted. Mediastinum/Nodes: Thoracic inlet is within normal limits. Scattered mediastinal nodes are  seen the largest of which measures 17 mm in the right paratracheal region. Right hilar node is noted measuring up to 12 mm. The esophagus is within normal limits. Lungs/Pleura: Lungs are well aerated bilaterally. Emphysematous changes are noted with scarring in the left upper lobe. No focal infiltrate is seen. Tiny right-sided pleural effusion is noted. Tiny less than 5 mm nodules are noted scattered throughout the right lung  these are most notable on images 29, 35 and 44 of series 6 on the right. Upper Abdomen: Visualized upper abdomen shows no acute abnormality. Musculoskeletal: No chest wall abnormality. No acute or significant osseous findings. Review of the MIP images confirms the above findings. IMPRESSION: No evidence of pulmonary emboli. Multiple pulmonary nodules. Most significant: Right solid pulmonary nodule measuring 4 mm. Per Fleischner Society Guidelines, no routine follow-up imaging is recommended. These guidelines do not apply to immunocompromised patients and patients with cancer. Follow up in patients with significant comorbidities as clinically warranted. For lung cancer screening, adhere to Lung-RADS guidelines. Reference: Radiology. 2017; 284(1):228-43. Small right pleural effusion. Scattered mediastinal and hilar nodes likely reactive in nature. Short-term follow-up in 3-6 months may be helpful to assess for stability/resolution. Aortic Atherosclerosis (ICD10-I70.0) and Emphysema (ICD10-J43.9). Electronically Signed   By: Oneil Devonshire M.D.   On: 11/21/2023 00:19   DG Chest 2 View Result Date: 11/20/2023 EXAM: 2 VIEW(S) XRAY OF THE CHEST 11/20/2023 04:13:00 PM COMPARISON: PA and lateral radiographs of the chest dated 11/20/2023. CLINICAL HISTORY: SOB x 3-4 days ago, HTN, smoker. FINDINGS: LUNGS AND PLEURA: Mild fullness of the central pulmonary vasculature. Mildly prominent reticular opacities again seen throughout the lungs, similar to the prior study. No pleural effusion. No pneumothorax.  HEART AND MEDIASTINUM: The heart is normal in size. No acute abnormality of the cardiac and mediastinal silhouettes. BONES AND SOFT TISSUES: No acute osseous abnormality. The right internal jugular chest port remains in place. IMPRESSION: 1. Mildly prominent reticular opacities throughout the lungs, similar to the prior study. 2. Mild fullness of the central pulmonary vasculature. Electronically signed by: evalene coho 11/20/2023 06:18 PM EDT RP Workstation: [id number]   DG Chest 2 View Result Date: 11/20/2023 CLINICAL DATA:  DOE, acute. EXAM: CHEST - 2 VIEW COMPARISON:  05/04/2022. FINDINGS: Mild-to-moderate diffuse increased interstitial markings with bilateral hilar predominance. Bilateral lung fields are otherwise clear. No acute consolidation or lung collapse. There is subtle blunting of bilateral costophrenic angles, which may represent bilateral trace pleural effusions. Stable cardio-mediastinal silhouette. No acute osseous abnormalities. The soft tissues are within normal limits. A new right-sided CT Port-A-Cath is seen with its tip overlying the midportion of superior vena cava. IMPRESSION: *Findings favor congestive heart failure/pulmonary edema. Electronically Signed   By: Ree Molt M.D.   On: 11/20/2023 11:08   Attending Note  I personally saw the patient, reviewed the chart and examined the patient. The plan of care was discussed with the patient and the admitting team. I agree with the assessment and plan as documented above. Thank you very much for the consultation. This is our pt on neoadj chemotherapy admitted with hypertensive urgency, AKI and heart failure. It is unusual to have mediastinal adenopathy from breast cancer without ipsilateral axillary adenopathy. So these could be indeed reactive in nature, but we also plan to repeat breast imaging to assess the effect of neoadj chemotherapy. If on that imaging, there is concern for breast cancer progression, we will try to pursue  evaluation of mediastinal adenopathy. She feels well, will continue to monitor her intermittently during the hospitalization and make her appropriate follow up.

## 2023-11-21 NOTE — Progress Notes (Signed)
 BLE venous duplex and ABI have been completed.   Results can be found under chart review under CV PROC. 11/21/2023 11:32 AM Jenaye Rickert RVT, RDMS

## 2023-11-21 NOTE — ED Notes (Addendum)
 Went to check patient's CBG, patient/ still in vascular.
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
## 2023-11-21 NOTE — Progress Notes (Signed)
 PROGRESS NOTE    [name]  FMW:[id number] DOB: 1946/03/04 DOA: 11/20/2023 PCP: [name], MD   78 y.o. female with medical history significant of breast cancer on chemotherapy since May 2025, CKD stage IIIa, chronic vertigo, hyperlipidemia, HFpEF 60 to 65%, essential hypertension, carotid stenosis, peripheral vascular disease, and prediabetes.  Currently undergoing chemotherapy for breast CA, last treatment was about 3 weeks ago.  Now presenting to the ED with dyspnea on exertion, orthopnea X 2 days.  In the ER blood pressure 235/103, troponin 918, D-dimer 1.3, WBC 14, hemoglobin 9.1 platelets 409, creatinine 1.04, chest x-ray with prominent reticular opacities, mild fullness of the pulmonary vasculature, CTA chest with no PE, multiple lung nodules, small right pleural effusion, mediastinal and hilar adenopathy    Subjective: Still with some dyspnea  Assessment and Plan:  Hypertensive emergency Acute on chronic diastolic heart failure preservation -echo with EF 60-65%, grade 2DD, RV normal -increase lasix  to 40mg  BID, increase aldactone  -continue hydralazine  100 mg 3 times daily, amlodipine  10 mg, labetalol  400 mg twice daily  Acute on chronic anemia -Hemoglobin down to 7.6, multifactorial secondary to chronic disease, chemotherapy -Also check anemia panel   Left ankle pain and swelling History of peripheral vascular disease -Follow-up Dopplers, also some suspicion for gout, check ESR and uric acid   History of carotid artery artery stenosis Continue Lipitor   Recent diagnosis of breast cancer on chemotherapy since May 2025 New diagnosis of breast cancer and currently on chemotherapy.  Follows [facility].   Generalized anxiety disorder -Continue bupropion    Pre-diabetic - Continue heart healthy carb modified diet and sliding send insulin  with mealtime coverage  Multiple lung nodules Mediastinal and hilar adenopathy -Needs oncology follow-up in background  of breast cancer     DVT prophylaxis:  SQ Heparin  Code Status:  Full Code Diet: Heart healthy carb modified diet    DVT prophylaxis: Heparin  subcutaneous Code Status: Full code Family Communication: Disposition Plan: Home 1 to 2 days  Consultants:    Procedures:   Antimicrobials:    Objective: Vitals:   11/21/23 0848 11/21/23 0900 11/21/23 1216 11/21/23 1220  BP:  (!) 197/73 (!) 170/67 (!) 170/67  Pulse:  84  78  Resp:  (!) 22  (!) 25  Temp: 99.1 F (37.3 C)     TempSrc: Oral     SpO2:  98%  100%  Weight:      Height:       No intake or output data in the 24 hours ending 11/21/23 1255 Filed Weights   11/20/23 1528  Weight: 69.9 kg    Examination:  General exam: Appears calm and comfortable  Respiratory system: Few basilar rales Cardiovascular system: S1 & S2 heard, RRR.  Abd: nondistended, soft and nontender.Normal bowel sounds heard. Central nervous system: Alert and oriented. No focal neurological deficits. Extremities: Trace edema Skin: No rashes Psychiatry:  Mood & affect appropriate.     Data Reviewed:   CBC: Recent Labs  Lab 11/20/23 1533 11/21/23 0608  WBC 14.4* 16.3*  HGB 9.1* 7.6*  HCT 29.2* 24.8*  MCV 91.3 91.9  PLT 409* 377   Basic Metabolic Panel: Recent Labs  Lab 11/20/23 1103 11/20/23 1533 11/21/23 0608  NA 141 141 140  K 3.7 3.6 3.5  CL 107 110 110  CO2 21 20* 21*  GLUCOSE 111* 107* 116*  BUN 12 12 13   CREATININE 0.94 1.04* 1.21*  CALCIUM  9.2 8.9 8.4*   GFR: Estimated Creatinine Clearance: 38 mL/min (  A) (by C-G formula based on SCr of 1.21 mg/dL (H)). Liver Function Tests: Recent Labs  Lab 11/20/23 1533 11/21/23 0608  AST 16 14*  ALT 11 10  ALKPHOS 100 70  BILITOT 1.0 0.8  PROT 7.0 6.3*  ALBUMIN 3.0* 2.5*   No results for input(s): LIPASE, AMYLASE in the last 168 hours. No results for input(s): AMMONIA in the last 168 hours. Coagulation Profile: No results for input(s): INR, PROTIME in the last  168 hours. Cardiac Enzymes: No results for input(s): CKTOTAL, CKMB, CKMBINDEX, TROPONINI in the last 168 hours. BNP (last 3 results) Recent Labs    11/20/23 1103  PROBNP 1,267.0*   HbA1C: No results for input(s): HGBA1C in the last 72 hours. CBG: Recent Labs  Lab 11/21/23 0218 11/21/23 0747 11/21/23 1221  GLUCAP 149* 116* 117*   Lipid Profile: No results for input(s): CHOL, HDL, LDLCALC, TRIG, CHOLHDL, LDLDIRECT in the last 72 hours. Thyroid  Function Tests: No results for input(s): TSH, T4TOTAL, FREET4, T3FREE, THYROIDAB in the last 72 hours. Anemia Panel: No results for input(s): VITAMINB12, FOLATE, FERRITIN, TIBC, IRON , RETICCTPCT in the last 72 hours. Urine analysis:    Component Value Date/Time   COLORURINE orange 10/28/2007 1123   APPEARANCEUR Hazy 10/28/2007 1123   LABSPEC 1.015 10/28/2007 1123   PHURINE 5.0 10/28/2007 1123   HGBUR large 10/28/2007 1123   BILIRUBINUR negative 10/28/2007 1123   UROBILINOGEN 1.0 10/28/2007 1123   NITRITE negative 10/28/2007 1123   Sepsis Labs: @LABRCNTIP (procalcitonin:4,lacticidven:4)  )No results found for this or any previous visit (from the past 240 hours).   Radiology Studies: VAS US  ABI WITH/WO TBI Result Date: 11/21/2023  LOWER EXTREMITY DOPPLER STUDY Patient Name:  [name]  Date of Exam:   11/21/2023 Medical Rec #: [id number]     Accession #:    [id number] Date of Birth: 1945-04-27     Patient Gender: F Patient Age:   44 years Exam Location:  [facility] Procedure:      VAS US  ABI WITH/WO TBI Referring Phys: MICAELA SPEAKER --------------------------------------------------------------------------------  Indications: Claudication. High Risk Factors: Hypertension, hyperlipidemia, current smoker. Other Factors: CHF, carotid stenosis, CKD.  Comparison Study: No previous exams Performing Technologist: Leigh Rom RVT/RDMS  Examination Guidelines: A complete evaluation includes at  minimum, Doppler waveform signals and systolic blood pressure reading at the level of bilateral brachial, anterior tibial, and posterior tibial arteries, when vessel segments are accessible. Bilateral testing is considered an integral part of a complete examination. Photoelectric Plethysmograph (PPG) waveforms and toe systolic pressure readings are included as required and additional duplex testing as needed. Limited examinations for reoccurring indications may be performed as noted.  ABI Findings: +---------+------------------+-----+----------+--------+ Right    Rt Pressure (mmHg)IndexWaveform  Comment  +---------+------------------+-----+----------+--------+ Brachial 189                    triphasic          +---------+------------------+-----+----------+--------+ PTA      146               0.77 monophasic         +---------+------------------+-----+----------+--------+ DP       140               0.74 monophasic         +---------+------------------+-----+----------+--------+ Great Toe103               0.54 Abnormal           +---------+------------------+-----+----------+--------+ +---------+------------------+-----+---------+--------------------------------+ Left  Lt Pressure (mmHg)IndexWaveform Comment                          +---------+------------------+-----+---------+--------------------------------+ Brachial                        triphasicHx on breast cancer - no                                                  pressures                        +---------+------------------+-----+---------+--------------------------------+ PTA      153               0.81 biphasic                                  +---------+------------------+-----+---------+--------------------------------+ DP       159               0.84 biphasic                                  +---------+------------------+-----+---------+--------------------------------+ Great Toe116                0.61 Abnormal                                  +---------+------------------+-----+---------+--------------------------------+  Summary: Right: Resting right ankle-brachial index indicates moderate right lower extremity arterial disease. The right toe-brachial index is abnormal. Left: Resting left ankle-brachial index indicates mild left lower extremity arterial disease. The left toe-brachial index is abnormal. *See table(s) above for measurements and observations.     Preliminary    VAS US  LOWER EXTREMITY VENOUS (DVT) Result Date: 11/21/2023  Lower Venous DVT Study Patient Name:  [name]  Date of Exam:   11/21/2023 Medical Rec #: [id number]     Accession #:    [id number] Date of Birth: 08-19-1945     Patient Gender: F Patient Age:   71 years Exam [facility]:  [facility] Procedure:      VAS US  LOWER EXTREMITY VENOUS (DVT) Referring Phys: MICAELA SUNDIL --------------------------------------------------------------------------------  Indications: Pain, and Swelling.  Risk Factors: Breast cancer, chemotherapy. Comparison Study: RLEV on 11/10/2008 was negative for DVT Performing Technologist: Ezzie Potters RVT, RDMS  Examination Guidelines: A complete evaluation includes B-mode imaging, spectral Doppler, color Doppler, and power Doppler as needed of all accessible portions of each vessel. Bilateral testing is considered an integral part of a complete examination. Limited examinations for reoccurring indications may be performed as noted. The reflux portion of the exam is performed with the patient in reverse Trendelenburg.  +---------+---------------+---------+-----------+----------+--------------+ RIGHT    CompressibilityPhasicitySpontaneityPropertiesThrombus Aging +---------+---------------+---------+-----------+----------+--------------+ CFV      Full           Yes      Yes                                 +---------+---------------+---------+-----------+----------+--------------+  SFJ      Full                                                        +---------+---------------+---------+-----------+----------+--------------+  FV Prox  Full           Yes      Yes                                 +---------+---------------+---------+-----------+----------+--------------+ FV Mid   Full           Yes      Yes                                 +---------+---------------+---------+-----------+----------+--------------+ FV DistalFull           Yes      Yes                                 +---------+---------------+---------+-----------+----------+--------------+ PFV      Full                                                        +---------+---------------+---------+-----------+----------+--------------+ POP      Full           Yes      Yes                                 +---------+---------------+---------+-----------+----------+--------------+ PTV      Full                                                        +---------+---------------+---------+-----------+----------+--------------+ PERO     Full                                                        +---------+---------------+---------+-----------+----------+--------------+   +---------+---------------+---------+-----------+----------+--------------+ LEFT     CompressibilityPhasicitySpontaneityPropertiesThrombus Aging +---------+---------------+---------+-----------+----------+--------------+ CFV      Full           Yes      Yes                                 +---------+---------------+---------+-----------+----------+--------------+ SFJ      Full                                                        +---------+---------------+---------+-----------+----------+--------------+ FV Prox  Full           Yes      Yes                                 +---------+---------------+---------+-----------+----------+--------------+ FV Mid   Full  Yes      Yes                                  +---------+---------------+---------+-----------+----------+--------------+ FV DistalFull           Yes      Yes                                 +---------+---------------+---------+-----------+----------+--------------+ PFV      Full                                                        +---------+---------------+---------+-----------+----------+--------------+ POP      Full           Yes      Yes                                 +---------+---------------+---------+-----------+----------+--------------+ PTV      Full                                                        +---------+---------------+---------+-----------+----------+--------------+ PERO     Full                                                        +---------+---------------+---------+-----------+----------+--------------+     Summary: BILATERAL: - No evidence of deep vein thrombosis seen in the lower extremities, bilaterally. -No evidence of popliteal cyst, bilaterally. - Ultrasound characteristics of enlarged lymph nodes are noted in the groin, bilaterally.   *See table(s) above for measurements and observations.    Preliminary    DG Ankle 2 Views Right Result Date: 11/21/2023 CLINICAL DATA:  Right ankle pain EXAM: RIGHT ANKLE - 2 VIEW COMPARISON:  01/21/2009 FINDINGS: No acute fracture or dislocation is noted. No soft tissue changes are seen. Calcaneal spurring is noted. IMPRESSION: Degenerative change without acute abnormality. Electronically Signed   By: Oneil Devonshire M.D.   On: 11/21/2023 02:52   CT Angio Chest PE W and/or Wo Contrast Result Date: 11/21/2023 CLINICAL DATA:  Positive D-dimer and shortness of breath EXAM: CT ANGIOGRAPHY CHEST WITH CONTRAST TECHNIQUE: Multidetector CT imaging of the chest was performed using the standard protocol during bolus administration of intravenous contrast. Multiplanar CT image reconstructions and MIPs were obtained to evaluate the  vascular anatomy. RADIATION DOSE REDUCTION: This exam was performed according to the departmental dose-optimization program which includes automated exposure control, adjustment of the mA and/or kV according to patient size and/or use of iterative reconstruction technique. CONTRAST:  50mL OMNIPAQUE  IOHEXOL  350 MG/ML SOLN COMPARISON:  Chest x-ray from earlier in the same day. FINDINGS: Cardiovascular: Atherosclerotic calcifications of the thoracic aorta are noted. No aneurysmal dilatation is seen. Heart is mildly enlarged in size. The pulmonary artery shows a normal branching pattern without evidence of intraluminal filling  defect to suggest pulmonary embolism. Right chest wall port is noted. Mediastinum/Nodes: Thoracic inlet is within normal limits. Scattered mediastinal nodes are seen the largest of which measures 17 mm in the right paratracheal region. Right hilar node is noted measuring up to 12 mm. The esophagus is within normal limits. Lungs/Pleura: Lungs are well aerated bilaterally. Emphysematous changes are noted with scarring in the left upper lobe. No focal infiltrate is seen. Tiny right-sided pleural effusion is noted. Tiny less than 5 mm nodules are noted scattered throughout the right lung these are most notable on images 29, 35 and 44 of series 6 on the right. Upper Abdomen: Visualized upper abdomen shows no acute abnormality. Musculoskeletal: No chest wall abnormality. No acute or significant osseous findings. Review of the MIP images confirms the above findings. IMPRESSION: No evidence of pulmonary emboli. Multiple pulmonary nodules. Most significant: Right solid pulmonary nodule measuring 4 mm. Per Fleischner Society Guidelines, no routine follow-up imaging is recommended. These guidelines do not apply to immunocompromised patients and patients with cancer. Follow up in patients with significant comorbidities as clinically warranted. For lung cancer screening, adhere to Lung-RADS guidelines.  Reference: Radiology. 2017; 284(1):228-43. Small right pleural effusion. Scattered mediastinal and hilar nodes likely reactive in nature. Short-term follow-up in 3-6 months may be helpful to assess for stability/resolution. Aortic Atherosclerosis (ICD10-I70.0) and Emphysema (ICD10-J43.9). Electronically Signed   By: Oneil Devonshire M.D.   On: 11/21/2023 00:19   DG Chest 2 View Result Date: 11/20/2023 EXAM: 2 VIEW(S) XRAY OF THE CHEST 11/20/2023 04:13:00 PM COMPARISON: PA and lateral radiographs of the chest dated 11/20/2023. CLINICAL HISTORY: SOB x 3-4 days ago, HTN, smoker. FINDINGS: LUNGS AND PLEURA: Mild fullness of the central pulmonary vasculature. Mildly prominent reticular opacities again seen throughout the lungs, similar to the prior study. No pleural effusion. No pneumothorax. HEART AND MEDIASTINUM: The heart is normal in size. No acute abnormality of the cardiac and mediastinal silhouettes. BONES AND SOFT TISSUES: No acute osseous abnormality. The right internal jugular chest port remains in place. IMPRESSION: 1. Mildly prominent reticular opacities throughout the lungs, similar to the prior study. 2. Mild fullness of the central pulmonary vasculature. Electronically signed by: evalene coho 11/20/2023 06:18 PM EDT RP Workstation: [id number]   DG Chest 2 View Result Date: 11/20/2023 CLINICAL DATA:  DOE, acute. EXAM: CHEST - 2 VIEW COMPARISON:  05/04/2022. FINDINGS: Mild-to-moderate diffuse increased interstitial markings with bilateral hilar predominance. Bilateral lung fields are otherwise clear. No acute consolidation or lung collapse. There is subtle blunting of bilateral costophrenic angles, which may represent bilateral trace pleural effusions. Stable cardio-mediastinal silhouette. No acute osseous abnormalities. The soft tissues are within normal limits. A new right-sided CT Port-A-Cath is seen with its tip overlying the midportion of superior vena cava. IMPRESSION: *Findings favor congestive  heart failure/pulmonary edema. Electronically Signed   By: Ree Molt M.D.   On: 11/20/2023 11:08     Scheduled Meds:  amLODipine   10 mg Oral Daily   atorvastatin   40 mg Oral Daily   buPROPion   150 mg Oral Daily   Chlorhexidine  Gluconate Cloth  6 each Topical Daily   furosemide   20 mg Intravenous BID   heparin   5,000 Units Subcutaneous Q8H   hydrALAZINE   100 mg Oral TID   insulin  aspart  0-5 Units Subcutaneous QHS   insulin  aspart  0-6 Units Subcutaneous TID WC   labetalol   400 mg Oral BID   pantoprazole   40 mg Oral Daily   sodium chloride  flush  10-40 mL  Intracatheter Q12H   sodium chloride  flush  3 mL Intravenous Q12H   spironolactone   12.5 mg Oral Daily   Continuous Infusions:  sodium chloride        LOS: 0 days    Time spent:    Sigurd Pac, MD [facility]   11/21/2023, 12:55 PM

## 2023-11-22 ENCOUNTER — Other Ambulatory Visit

## 2023-11-22 ENCOUNTER — Encounter: Payer: Self-pay | Admitting: *Deleted

## 2023-11-22 DIAGNOSIS — I161 Hypertensive emergency: Secondary | ICD-10-CM | POA: Diagnosis not present

## 2023-11-22 LAB — CBC
HCT: 23.7 % — ABNORMAL LOW (ref 36.0–46.0)
Hemoglobin: 7.1 g/dL — ABNORMAL LOW (ref 12.0–15.0)
MCH: 28.2 pg (ref 26.0–34.0)
MCHC: 30 g/dL (ref 30.0–36.0)
MCV: 94 fL (ref 80.0–100.0)
Platelets: 326 K/uL (ref 150–400)
RBC: 2.52 MIL/uL — ABNORMAL LOW (ref 3.87–5.11)
RDW: 20.3 % — ABNORMAL HIGH (ref 11.5–15.5)
WBC: 10.6 K/uL — ABNORMAL HIGH (ref 4.0–10.5)
nRBC: 0 % (ref 0.0–0.2)

## 2023-11-22 LAB — BASIC METABOLIC PANEL WITH GFR
Anion gap: 9 (ref 5–15)
BUN: 20 mg/dL (ref 8–23)
CO2: 23 mmol/L (ref 22–32)
Calcium: 8.2 mg/dL — ABNORMAL LOW (ref 8.9–10.3)
Chloride: 107 mmol/L (ref 98–111)
Creatinine, Ser: 1.92 mg/dL — ABNORMAL HIGH (ref 0.44–1.00)
GFR, Estimated: 27 mL/min — ABNORMAL LOW (ref >60)
Glucose, Bld: 109 mg/dL — ABNORMAL HIGH (ref 70–99)
Potassium: 3.6 mmol/L (ref 3.5–5.1)
Sodium: 139 mmol/L (ref 135–145)

## 2023-11-22 LAB — CBG MONITORING, ED: Glucose-Capillary: 139 mg/dL — ABNORMAL HIGH (ref 70–99)

## 2023-11-22 LAB — GLUCOSE, CAPILLARY
Glucose-Capillary: 120 mg/dL — ABNORMAL HIGH (ref 70–99)
Glucose-Capillary: 181 mg/dL — ABNORMAL HIGH (ref 70–99)
Glucose-Capillary: 178 mg/dL — ABNORMAL HIGH (ref 70–99)

## 2023-11-22 LAB — PREPARE RBC (CROSSMATCH)

## 2023-11-22 MED ORDER — MORPHINE SULFATE (PF) 2 MG/ML IV SOLN: 1.0000 mg | INTRAVENOUS | Status: DC | PRN

## 2023-11-22 MED ORDER — ENSURE PLUS HIGH PROTEIN PO LIQD
237.0000 mL | Freq: Two times a day (BID) | ORAL | Status: DC
  Administered 2023-11-22 – 2023-11-26 (×7): 237 mL via ORAL

## 2023-11-22 MED ORDER — SODIUM CHLORIDE 0.9% IV SOLUTION: Freq: Once | INTRAVENOUS | Status: DC

## 2023-11-22 MED ORDER — ENOXAPARIN SODIUM 30 MG/0.3ML IJ SOSY
30.0000 mg | PREFILLED_SYRINGE | INTRAMUSCULAR | Status: DC
  Administered 2023-11-23 – 2023-11-26 (×4): 30 mg via SUBCUTANEOUS
  Filled 2023-11-22 (×4): qty 0.3

## 2023-11-22 MED ORDER — PREDNISONE 20 MG PO TABS
40.0000 mg | ORAL_TABLET | Freq: Every day | ORAL | Status: DC
  Administered 2023-11-22 – 2023-11-24 (×3): 40 mg via ORAL
  Filled 2023-11-22 (×3): qty 2

## 2023-11-22 MED ORDER — HYDRALAZINE HCL 50 MG PO TABS
50.0000 mg | ORAL_TABLET | Freq: Three times a day (TID) | ORAL | Status: DC
  Administered 2023-11-22 – 2023-11-23 (×5): 50 mg via ORAL
  Filled 2023-11-22 (×5): qty 1

## 2023-11-22 NOTE — ED Notes (Signed)
 Blood bank calling to notify of + antibody screen for this pt. There will be a delay with transfusion for this reason.

## 2023-11-22 NOTE — ED Notes (Addendum)
 Report called to [facility] on 6E.

## 2023-11-22 NOTE — Progress Notes (Signed)
 Heart Failure Navigator Progress Note  Assessed for Heart & Vascular TOC clinic readiness.  Patient does not meet criteria due to EF 60-65%. No HF TOC per Dr. Drexel Gentles. .   Navigator will sign off at this time.   Randie Bustle, BSN, Scientist, clinical (histocompatibility and immunogenetics) Only

## 2023-11-22 NOTE — ED Notes (Signed)
 Blood orders released. Awaiting blood bank to notify of availability.

## 2023-11-22 NOTE — TOC Initial Note (Addendum)
 Transition of Care [facility]) - Initial/Assessment Note    Patient Details  Name: [name] MRN: [id number] Date of Birth: [birth year]  Transition of Care [facility]) CM/SW Contact:    Isaiah Public, LCSWA Phone Number: 11/22/2023, 12:55 PM  Clinical Narrative:                  CSW spoke with patient at bedside. Patient reports PTA she comes from home with brother. Patient reports her plan at dc is to return back home.Patient reports her sister or someone from her family will provide transportation for her when medically ready for dc. Patient reports her PCP is Glade Hope MD with [facility] and her pharmacy is [facility] [place].CSW completed SDOH assessment. CSW offered patient [employer].Patient accepted. Patient informed CSW that her cell phone charger was left in her previous room in ED,RM4 . CSW informed Higher education careers adviser to see if we can locate patients cell Advertising account planner. CSW informed patient CSW informed Chiropodist.All questions answered. No further questions reported at this time.       Patient Goals and CMS Choice            Expected Discharge Plan and Services                                              Prior Living Arrangements/Services                       Activities of Daily Living   ADL Screening (condition at time of admission) Independently performs ADLs?: Yes (appropriate for developmental age) Is the patient deaf or have difficulty hearing?: No Does the patient have difficulty seeing, even when wearing glasses/contacts?: No Does the patient have difficulty concentrating, remembering, or making decisions?: No  Permission Sought/Granted                  Emotional Assessment              Admission diagnosis:  Hypertensive emergency [I16.1] Acute congestive heart failure, unspecified heart failure type [facility]) [I50.9] Patient Active Problem List   Diagnosis Date Noted    Acute exacerbation of CHF (congestive heart failure) (HCC) 11/21/2023   Bilateral calf pain 11/21/2023   Hypertensive emergency 11/21/2023   History of breast cancer 11/21/2023   GAD (generalized anxiety disorder) 11/21/2023   DOE (dyspnea on exertion) 11/20/2023   Port-A-Cath in place 10/31/2023   Skin abnormality 09/30/2023   Genetic testing 09/11/2023   Malignant neoplasm of lower-inner quadrant of left breast in female, estrogen receptor negative (HCC) 08/26/2023   CKD (chronic kidney disease) stage 3, GFR 30-59 ml/min (HCC) 06/16/2023   Muscle cramping 07/09/2022   Weight loss 05/04/2022   Subacute cough 05/04/2022   Vitamin D  deficiency 12/04/2021   Low vitamin B12 level 12/04/2021   Clavicle enlargement 07/23/2018   Osteopenia 06/14/2018   Achilles tendinitis 01/29/2017   Bilateral carotid artery occlusion 01/29/2017   Neuropathy at site of breast surgery 01/28/2017   Prediabetes 07/24/2015   GERD (gastroesophageal reflux disease) 07/20/2015   Vitiligo 07/07/2012   PLANTAR FASCIITIS 06/16/2010   Tobacco dependence due to cigarettes 06/14/2009   Hyperlipidemia 11/10/2008   Angioedema 01/09/2008   Carotid stenosis 06/17/2007   Essential hypertension 06/12/2007   Personal history of malignant neoplasm of breast 02/25/2007   History  of peripheral vascular disease 02/21/2007   PCP:  [name], MD Pharmacy:   [phone] [place] [place] - [address] [facility] [address] [place] [address] Phone: [phone] Fax: [phone]     Social Drivers of Health (SDOH) Social History: SDOH Screenings   Food Insecurity: No Food Insecurity (11/22/2023)  Housing: Low Risk  (11/22/2023)  Recent Concern: Housing - High Risk (08/28/2023)  Transportation Needs: No Transportation Needs (11/22/2023)  Utilities: Not At Risk (11/22/2023)  Recent Concern: Utilities - At Risk (08/28/2023)  Alcohol Screen: Low Risk  (05/31/2023)  Depression (PHQ2-9): Low  Risk  (10/10/2023)  Financial Resource Strain: Medium Risk (05/31/2023)  Physical Activity: Sufficiently Active (05/31/2023)  Social Connections: Socially Isolated (11/22/2023)  Stress: No Stress Concern Present (05/31/2023)  Tobacco Use: High Risk (11/21/2023)  Health Literacy: Adequate Health Literacy (05/31/2023)   SDOH Interventions: Utilities Interventions: Community Resources Provided   Readmission Risk Interventions     No data to display

## 2023-11-22 NOTE — ED Notes (Signed)
 Introduced self to patient at this time. Patient is resting in bed with visible chest rise and fall. The call light is in reach. There are no further requests at this time.

## 2023-11-22 NOTE — Progress Notes (Addendum)
 PROGRESS NOTE    [name]  FMW:[id number] DOB: 10-07-1945 DOA: 11/20/2023 PCP: [name], MD   78 y.o. female with medical history significant of breast cancer on chemotherapy since May 2025, CKD stage IIIa, chronic vertigo, hyperlipidemia, HFpEF 60 to 65%, essential hypertension, carotid stenosis, peripheral vascular disease, and prediabetes.  Currently undergoing chemotherapy for breast CA, last treatment was about 3 weeks ago.  Now presenting to the ED with dyspnea on exertion, orthopnea X 2 days and L ankle pain.  In the ER blood pressure 235/103, troponin 918, D-dimer 1.3, WBC 14, hemoglobin 9.1 platelets 409, creatinine 1.04, chest x-ray with prominent reticular opacities, mild fullness of the pulmonary vasculature, CTA chest with no PE, multiple lung nodules, small right pleural effusion, mediastinal and hilar adenopathy  -Admitted, started on diuretics - 8/15 transfuse PRBC, prednisone  for gout  Subjective: - Feels fair overall, denies any dyspnea today, still bothered by left ankle pain  Assessment and Plan:  Hypertensive emergency Acute on chronic diastolic heart failure preservation -echo with EF 60-65%, grade 2DD, RV normal - Volume status is improved, hold further IV Lasix , creatinine has trended up, hold Aldactone  as well -continue hydralazine  will drop dose to 50 mg 3 times daily, amlodipine  10 mg, labetalol  400 mg twice daily  AKI -could be cardiorenal, holding diuretics -she also got contrast in the ED on admission, monitor UO, BMP in am  Acute on chronic anemia -Hemoglobin down to 7.1, multifactorial secondary to chronic disease, chemotherapy - Anemia panel with iron  deficiency as well -Transfuse 1 unit PRBC today   Left ankle pain and swelling, suspected gout flare -Uric acid and ESR elevated, add prednisone , likely triggered by chemotherapy   History of carotid artery artery stenosis Continue Lipitor   Recent diagnosis of breast cancer on chemotherapy  since May 2025 New diagnosis of breast cancer and currently on chemotherapy.  Follows [facility].   Generalized anxiety disorder -Continue bupropion    Pre-diabetic - Continue heart healthy carb modified diet and sliding send insulin  with mealtime coverage  Multiple lung nodules Mediastinal and hilar adenopathy -Needs oncology follow-up in background of breast cancer     DVT prophylaxis:  SQ Heparin  Code Status:  Full Code Diet: Heart healthy carb modified diet    DVT prophylaxis: Heparin  subcutaneous Code Status: Full code Family Communication: Disposition Plan: Home 1 to 2 days  Consultants:    Procedures:   Antimicrobials:    Objective: Vitals:   11/22/23 0800 11/22/23 0938 11/22/23 1107 11/22/23 1209  BP: (!) 171/74  (!) 143/60 (!) 116/53  Pulse: 89  78 71  Resp: (!) 28  18 18   Temp:  98.8 F (37.1 C)  99.7 F (37.6 C)  TempSrc:  Oral  Oral  SpO2: 100%  100% 100%  Weight:      Height:        Intake/Output Summary (Last 24 hours) at 11/22/2023 1238 Last data filed at 11/22/2023 1200 Gross per 24 hour  Intake 360 ml  Output --  Net 360 ml   Filed Weights   11/20/23 1528  Weight: 69.9 kg    Examination:  General exam: Appears calm and comfortable  Respiratory system: Few basilar rales Cardiovascular system: S1 & S2 heard, RRR.  Abd: nondistended, soft and nontender.Normal bowel sounds heard. Central nervous system: Alert and oriented. No focal neurological deficits. Extremities: Trace edema Skin: No rashes Psychiatry:  Mood & affect appropriate.     Data Reviewed:   CBC: Recent Labs  Lab  11/20/23 1533 11/21/23 0608 11/22/23 0725  WBC 14.4* 16.3* 10.6*  HGB 9.1* 7.6* 7.1*  HCT 29.2* 24.8* 23.7*  MCV 91.3 91.9 94.0  PLT 409* 377 326   Basic Metabolic Panel: Recent Labs  Lab 11/20/23 1103 11/20/23 1533 11/21/23 0608 11/22/23 0725  NA 141 141 140 139  K 3.7 3.6 3.5 3.6  CL 107 110 110 107  CO2 21 20* 21* 23  GLUCOSE  111* 107* 116* 109*  BUN 12 12 13 20   CREATININE 0.94 1.04* 1.21* 1.92*  CALCIUM  9.2 8.9 8.4* 8.2*   GFR: Estimated Creatinine Clearance: 23.9 mL/min (A) (by C-G formula based on SCr of 1.92 mg/dL (H)). Liver Function Tests: Recent Labs  Lab 11/20/23 1533 11/21/23 0608  AST 16 14*  ALT 11 10  ALKPHOS 100 70  BILITOT 1.0 0.8  PROT 7.0 6.3*  ALBUMIN 3.0* 2.5*   No results for input(s): LIPASE, AMYLASE in the last 168 hours. No results for input(s): AMMONIA in the last 168 hours. Coagulation Profile: No results for input(s): INR, PROTIME in the last 168 hours. Cardiac Enzymes: No results for input(s): CKTOTAL, CKMB, CKMBINDEX, TROPONINI in the last 168 hours. BNP (last 3 results) Recent Labs    11/20/23 1103  PROBNP 1,267.0*   HbA1C: No results for input(s): HGBA1C in the last 72 hours. CBG: Recent Labs  Lab 11/21/23 1221 11/21/23 1821 11/21/23 2227 11/22/23 0824 11/22/23 1217  GLUCAP 117* 113* 124* 139* 120*   Lipid Profile: No results for input(s): CHOL, HDL, LDLCALC, TRIG, CHOLHDL, LDLDIRECT in the last 72 hours. Thyroid  Function Tests: No results for input(s): TSH, T4TOTAL, FREET4, T3FREE, THYROIDAB in the last 72 hours. Anemia Panel: Recent Labs    11/21/23 1834  VITAMINB12 4,316*  FOLATE 9.6  FERRITIN 200  TIBC 204*  IRON  8*  RETICCTPCT 3.0   Urine analysis:    Component Value Date/Time   COLORURINE orange 10/28/2007 1123   APPEARANCEUR Hazy 10/28/2007 1123   LABSPEC 1.015 10/28/2007 1123   PHURINE 5.0 10/28/2007 1123   HGBUR large 10/28/2007 1123   BILIRUBINUR negative 10/28/2007 1123   UROBILINOGEN 1.0 10/28/2007 1123   NITRITE negative 10/28/2007 1123   Sepsis Labs: @LABRCNTIP (procalcitonin:4,lacticidven:4)  )No results found for this or any previous visit (from the past 240 hours).   Radiology Studies: VAS US  ABI WITH/WO TBI Result Date: 11/21/2023  LOWER EXTREMITY DOPPLER STUDY Patient Name:   [name]  Date of Exam:   11/21/2023 Medical Rec #: [id number]     Accession #:    [id number] Date of Birth: Jan 03, 1946     Patient Gender: F Patient Age:   51 years Exam Location:  [facility] Procedure:      VAS US  ABI WITH/WO TBI Referring Phys: MICAELA SPEAKER --------------------------------------------------------------------------------  Indications: Claudication. High Risk Factors: Hypertension, hyperlipidemia, current smoker. Other Factors: CHF, carotid stenosis, CKD.  Comparison Study: No previous exams Performing Technologist: Leigh Rom RVT/RDMS  Examination Guidelines: A complete evaluation includes at minimum, Doppler waveform signals and systolic blood pressure reading at the level of bilateral brachial, anterior tibial, and posterior tibial arteries, when vessel segments are accessible. Bilateral testing is considered an integral part of a complete examination. Photoelectric Plethysmograph (PPG) waveforms and toe systolic pressure readings are included as required and additional duplex testing as needed. Limited examinations for reoccurring indications may be performed as noted.  ABI Findings: +---------+------------------+-----+----------+--------+ Right    Rt Pressure (mmHg)IndexWaveform  Comment  +---------+------------------+-----+----------+--------+ Brachial 189  triphasic          +---------+------------------+-----+----------+--------+ PTA      146               0.77 monophasic         +---------+------------------+-----+----------+--------+ DP       140               0.74 monophasic         +---------+------------------+-----+----------+--------+ Great Toe103               0.54 Abnormal           +---------+------------------+-----+----------+--------+ +---------+------------------+-----+---------+--------------------------------+ Left     Lt Pressure (mmHg)IndexWaveform Comment                           +---------+------------------+-----+---------+--------------------------------+ Brachial                        triphasicHx on breast cancer - no                                                  pressures                        +---------+------------------+-----+---------+--------------------------------+ PTA      153               0.81 biphasic                                  +---------+------------------+-----+---------+--------------------------------+ DP       159               0.84 biphasic                                  +---------+------------------+-----+---------+--------------------------------+ Great Toe116               0.61 Abnormal                                  +---------+------------------+-----+---------+--------------------------------+  Summary: Right: Resting right ankle-brachial index indicates moderate right lower extremity arterial disease. The right toe-brachial index is abnormal. Left: Resting left ankle-brachial index indicates mild left lower extremity arterial disease. The left toe-brachial index is abnormal. *See table(s) above for measurements and observations.  Electronically signed by Gaile New MD on 11/21/2023 at 7:14:03 PM.    Final    VAS US  LOWER EXTREMITY VENOUS (DVT) Result Date: 11/21/2023  Lower Venous DVT Study Patient Name:  [name]  Date of Exam:   11/21/2023 Medical Rec #: [id number]     Accession #:    [id number] Date of Birth: [birth year]     Patient Gender: F Patient Age:   57 years Exam Location:  [facility] Procedure:      VAS US  LOWER EXTREMITY VENOUS (DVT) Referring Phys: MICAELA SPEAKER --------------------------------------------------------------------------------  Indications: Pain, and Swelling.  Risk Factors: Breast cancer, chemotherapy. Comparison Study: RLEV on 11/10/2008 was negative for DVT Performing Technologist: Ezzie Potters RVT, RDMS  Examination Guidelines: A complete evaluation includes B-mode imaging,  spectral Doppler, color Doppler, and power Doppler  as needed of all accessible portions of each vessel. Bilateral testing is considered an integral part of a complete examination. Limited examinations for reoccurring indications may be performed as noted. The reflux portion of the exam is performed with the patient in reverse Trendelenburg.  +---------+---------------+---------+-----------+----------+--------------+ RIGHT    CompressibilityPhasicitySpontaneityPropertiesThrombus Aging +---------+---------------+---------+-----------+----------+--------------+ CFV      Full           Yes      Yes                                 +---------+---------------+---------+-----------+----------+--------------+ SFJ      Full                                                        +---------+---------------+---------+-----------+----------+--------------+ FV Prox  Full           Yes      Yes                                 +---------+---------------+---------+-----------+----------+--------------+ FV Mid   Full           Yes      Yes                                 +---------+---------------+---------+-----------+----------+--------------+ FV DistalFull           Yes      Yes                                 +---------+---------------+---------+-----------+----------+--------------+ PFV      Full                                                        +---------+---------------+---------+-----------+----------+--------------+ POP      Full           Yes      Yes                                 +---------+---------------+---------+-----------+----------+--------------+ PTV      Full                                                        +---------+---------------+---------+-----------+----------+--------------+ PERO     Full                                                        +---------+---------------+---------+-----------+----------+--------------+    +---------+---------------+---------+-----------+----------+--------------+ LEFT     CompressibilityPhasicitySpontaneityPropertiesThrombus Aging +---------+---------------+---------+-----------+----------+--------------+ CFV      Full  Yes      Yes                                 +---------+---------------+---------+-----------+----------+--------------+ SFJ      Full                                                        +---------+---------------+---------+-----------+----------+--------------+ FV Prox  Full           Yes      Yes                                 +---------+---------------+---------+-----------+----------+--------------+ FV Mid   Full           Yes      Yes                                 +---------+---------------+---------+-----------+----------+--------------+ FV DistalFull           Yes      Yes                                 +---------+---------------+---------+-----------+----------+--------------+ PFV      Full                                                        +---------+---------------+---------+-----------+----------+--------------+ POP      Full           Yes      Yes                                 +---------+---------------+---------+-----------+----------+--------------+ PTV      Full                                                        +---------+---------------+---------+-----------+----------+--------------+ PERO     Full                                                        +---------+---------------+---------+-----------+----------+--------------+     Summary: BILATERAL: - No evidence of deep vein thrombosis seen in the lower extremities, bilaterally. -No evidence of popliteal cyst, bilaterally. - Ultrasound characteristics of enlarged lymph nodes are noted in the groin, bilaterally.   *See table(s) above for measurements and observations. Electronically signed by Gaile New MD on 11/21/2023  at 7:10:37 PM.    Final    ECHOCARDIOGRAM COMPLETE Result Date: 11/21/2023    ECHOCARDIOGRAM REPORT   Patient Name:   [name] Date of Exam: 11/21/2023 Medical Rec #:  [id number]    Height:       64.0 in Accession #:    [id number]   Weight:       154.1 lb Date of Birth:  1945-05-13    BSA:          1.751 m Patient Age:    78 years     BP:           197/73 mmHg Patient Gender: F            HR:           65 bpm. Exam Location:  Inpatient Procedure: 2D Echo, Cardiac Doppler and Color Doppler (Both Spectral and Color            Flow Doppler were utilized during procedure). Indications:    CHF  History:        Patient has prior history of Echocardiogram examinations, most                 recent 12/18/2021. CHF; Risk Factors:Hypertension.  Sonographer:    Carmelita Hartshorn RDCS, FE, PE Referring Phys: [id number] SUBRINA SUNDIL IMPRESSIONS  1. Left ventricular ejection fraction, by estimation, is 60 to 65%. The left ventricle has normal function. The left ventricle has no regional wall motion abnormalities. Left ventricular diastolic parameters are consistent with Grade II diastolic dysfunction (pseudonormalization).  2. Right ventricular systolic function is normal. The right ventricular size is normal. Tricuspid regurgitation signal is inadequate for assessing PA pressure.  3. Left atrial size was severely dilated.  4. Right atrial size was mildly dilated.  5. The mitral valve is grossly normal. Trivial mitral valve regurgitation. No evidence of mitral stenosis.  6. The aortic valve is tricuspid. Aortic valve regurgitation is not visualized. No aortic stenosis is present.  7. The inferior vena cava is normal in size with greater than 50% respiratory variability, suggesting right atrial pressure of 3 mmHg. Comparison(s): No significant change from prior study. FINDINGS  Left Ventricle: Left ventricular ejection fraction, by estimation, is 60 to 65%. The left ventricle has normal function. The left ventricle has no regional  wall motion abnormalities. The left ventricular internal cavity size was normal in size. There is  no left ventricular hypertrophy. Left ventricular diastolic parameters are consistent with Grade II diastolic dysfunction (pseudonormalization). Right Ventricle: The right ventricular size is normal. No increase in right ventricular wall thickness. Right ventricular systolic function is normal. Tricuspid regurgitation signal is inadequate for assessing PA pressure. Left Atrium: Left atrial size was severely dilated. Right Atrium: Right atrial size was mildly dilated. Pericardium: There is no evidence of pericardial effusion. Mitral Valve: The mitral valve is grossly normal. Trivial mitral valve regurgitation. No evidence of mitral valve stenosis. Tricuspid Valve: The tricuspid valve is grossly normal. Tricuspid valve regurgitation is trivial. No evidence of tricuspid stenosis. Aortic Valve: The aortic valve is tricuspid. Aortic valve regurgitation is not visualized. No aortic stenosis is present. Pulmonic Valve: The pulmonic valve was grossly normal. Pulmonic valve regurgitation is not visualized. No evidence of pulmonic stenosis. Aorta: The aortic root and ascending aorta are structurally normal, with no evidence of dilitation. Venous: The right lower pulmonary vein is normal. The inferior vena cava is normal in size with greater than 50% respiratory variability, suggesting right atrial pressure of 3 mmHg. IAS/Shunts: The atrial septum is grossly normal.  LEFT VENTRICLE PLAX 2D LVIDd:         5.00 cm   Diastology LVIDs:  3.40 cm   LV e' medial:    8.38 cm/s LV PW:         1.10 cm   LV E/e' medial:  22.6 LV IVS:        0.90 cm   LV e' lateral:   9.03 cm/s LVOT diam:     2.00 cm   LV E/e' lateral: 20.9 LV SV:         81 LV SV Index:   46 LVOT Area:     3.14 cm  RIGHT VENTRICLE RV Basal diam:  3.50 cm RV Mid diam:    3.10 cm RV S prime:     13.90 cm/s TAPSE (M-mode): 2.0 cm LEFT ATRIUM              Index         RIGHT ATRIUM           Index LA diam:        4.50 cm  2.57 cm/m   RA Area:     15.50 cm LA Vol (A2C):   95.9 ml  54.76 ml/m  RA Volume:   37.50 ml  21.41 ml/m LA Vol (A4C):   121.0 ml 69.10 ml/m LA Biplane Vol: 110.0 ml 62.81 ml/m  AORTIC VALVE LVOT Vmax:   125.00 cm/s LVOT Vmean:  85.200 cm/s LVOT VTI:    0.259 m  AORTA Ao Root diam: 3.00 cm Ao Asc diam:  2.50 cm MITRAL VALVE MV Area (PHT): 3.37 cm     SHUNTS MV Decel Time: 225 msec     Systemic VTI:  0.26 m MV E velocity: 189.00 cm/s  Systemic Diam: 2.00 cm MV A velocity: 130.00 cm/s MV E/A ratio:  1.45 Darryle Decent MD Electronically signed by Darryle Decent MD Signature Date/Time: 11/21/2023/2:13:39 PM    Final    DG Ankle 2 Views Right Result Date: 11/21/2023 CLINICAL DATA:  Right ankle pain EXAM: RIGHT ANKLE - 2 VIEW COMPARISON:  01/21/2009 FINDINGS: No acute fracture or dislocation is noted. No soft tissue changes are seen. Calcaneal spurring is noted. IMPRESSION: Degenerative change without acute abnormality. Electronically Signed   By: Oneil Devonshire M.D.   On: 11/21/2023 02:52   CT Angio Chest PE W and/or Wo Contrast Result Date: 11/21/2023 CLINICAL DATA:  Positive D-dimer and shortness of breath EXAM: CT ANGIOGRAPHY CHEST WITH CONTRAST TECHNIQUE: Multidetector CT imaging of the chest was performed using the standard protocol during bolus administration of intravenous contrast. Multiplanar CT image reconstructions and MIPs were obtained to evaluate the vascular anatomy. RADIATION DOSE REDUCTION: This exam was performed according to the departmental dose-optimization program which includes automated exposure control, adjustment of the mA and/or kV according to patient size and/or use of iterative reconstruction technique. CONTRAST:  50mL OMNIPAQUE  IOHEXOL  350 MG/ML SOLN COMPARISON:  Chest x-ray from earlier in the same day. FINDINGS: Cardiovascular: Atherosclerotic calcifications of the thoracic aorta are noted. No aneurysmal dilatation is seen.  Heart is mildly enlarged in size. The pulmonary artery shows a normal branching pattern without evidence of intraluminal filling defect to suggest pulmonary embolism. Right chest wall port is noted. Mediastinum/Nodes: Thoracic inlet is within normal limits. Scattered mediastinal nodes are seen the largest of which measures 17 mm in the right paratracheal region. Right hilar node is noted measuring up to 12 mm. The esophagus is within normal limits. Lungs/Pleura: Lungs are well aerated bilaterally. Emphysematous changes are noted with scarring in the left upper lobe. No focal infiltrate is seen. Tiny right-sided  pleural effusion is noted. Tiny less than 5 mm nodules are noted scattered throughout the right lung these are most notable on images 29, 35 and 44 of series 6 on the right. Upper Abdomen: Visualized upper abdomen shows no acute abnormality. Musculoskeletal: No chest wall abnormality. No acute or significant osseous findings. Review of the MIP images confirms the above findings. IMPRESSION: No evidence of pulmonary emboli. Multiple pulmonary nodules. Most significant: Right solid pulmonary nodule measuring 4 mm. Per Fleischner Society Guidelines, no routine follow-up imaging is recommended. These guidelines do not apply to immunocompromised patients and patients with cancer. Follow up in patients with significant comorbidities as clinically warranted. For lung cancer screening, adhere to Lung-RADS guidelines. Reference: Radiology. 2017; 284(1):228-43. Small right pleural effusion. Scattered mediastinal and hilar nodes likely reactive in nature. Short-term follow-up in 3-6 months may be helpful to assess for stability/resolution. Aortic Atherosclerosis (ICD10-I70.0) and Emphysema (ICD10-J43.9). Electronically Signed   By: Oneil Devonshire M.D.   On: 11/21/2023 00:19   DG Chest 2 View Result Date: 11/20/2023 EXAM: 2 VIEW(S) XRAY OF THE CHEST 11/20/2023 04:13:00 PM COMPARISON: PA and lateral radiographs of the  chest dated 11/20/2023. CLINICAL HISTORY: SOB x 3-4 days ago, HTN, smoker. FINDINGS: LUNGS AND PLEURA: Mild fullness of the central pulmonary vasculature. Mildly prominent reticular opacities again seen throughout the lungs, similar to the prior study. No pleural effusion. No pneumothorax. HEART AND MEDIASTINUM: The heart is normal in size. No acute abnormality of the cardiac and mediastinal silhouettes. BONES AND SOFT TISSUES: No acute osseous abnormality. The right internal jugular chest port remains in place. IMPRESSION: 1. Mildly prominent reticular opacities throughout the lungs, similar to the prior study. 2. Mild fullness of the central pulmonary vasculature. Electronically signed by: timothy berrigan 11/20/2023 06:18 PM EDT RP Workstation: [id number]     Scheduled Meds:  sodium chloride    Intravenous Once   amLODipine   10 mg Oral Daily   atorvastatin   40 mg Oral Daily   buPROPion   150 mg Oral Daily   Chlorhexidine  Gluconate Cloth  6 each Topical Daily   feeding supplement  237 mL Oral BID BM   heparin   5,000 Units Subcutaneous Q8H   hydrALAZINE   100 mg Oral TID   insulin  aspart  0-5 Units Subcutaneous QHS   insulin  aspart  0-6 Units Subcutaneous TID WC   labetalol   400 mg Oral BID   pantoprazole   40 mg Oral Daily   predniSONE   40 mg Oral Q breakfast   sodium chloride  flush  10-40 mL Intracatheter Q12H   sodium chloride  flush  3 mL Intravenous Q12H   Continuous Infusions:     LOS: 1 day    Time spent:    Sigurd Pac, MD [facility]   11/22/2023, 12:38 PM

## 2023-11-23 ENCOUNTER — Inpatient Hospital Stay

## 2023-11-23 ENCOUNTER — Inpatient Hospital Stay (HOSPITAL_COMMUNITY)

## 2023-11-23 DIAGNOSIS — I161 Hypertensive emergency: Secondary | ICD-10-CM | POA: Diagnosis not present

## 2023-11-23 LAB — CBC
HCT: 24.6 % — ABNORMAL LOW (ref 36.0–46.0)
Hemoglobin: 7.6 g/dL — ABNORMAL LOW (ref 12.0–15.0)
MCH: 27.7 pg (ref 26.0–34.0)
MCHC: 30.9 g/dL (ref 30.0–36.0)
MCV: 89.8 fL (ref 80.0–100.0)
Platelets: 300 K/uL (ref 150–400)
RBC: 2.74 MIL/uL — ABNORMAL LOW (ref 3.87–5.11)
RDW: 19.1 % — ABNORMAL HIGH (ref 11.5–15.5)
WBC: 10.3 K/uL (ref 4.0–10.5)
nRBC: 0 % (ref 0.0–0.2)

## 2023-11-23 LAB — BASIC METABOLIC PANEL WITH GFR
Anion gap: 11 (ref 5–15)
BUN: 32 mg/dL — ABNORMAL HIGH (ref 8–23)
CO2: 21 mmol/L — ABNORMAL LOW (ref 22–32)
Calcium: 8.3 mg/dL — ABNORMAL LOW (ref 8.9–10.3)
Chloride: 106 mmol/L (ref 98–111)
Creatinine, Ser: 2.75 mg/dL — ABNORMAL HIGH (ref 0.44–1.00)
GFR, Estimated: 17 mL/min — ABNORMAL LOW (ref >60)
Glucose, Bld: 112 mg/dL — ABNORMAL HIGH (ref 70–99)
Potassium: 4.2 mmol/L (ref 3.5–5.1)
Sodium: 138 mmol/L (ref 135–145)

## 2023-11-23 LAB — GLUCOSE, CAPILLARY
Glucose-Capillary: 111 mg/dL — ABNORMAL HIGH (ref 70–99)
Glucose-Capillary: 146 mg/dL — ABNORMAL HIGH (ref 70–99)
Glucose-Capillary: 160 mg/dL — ABNORMAL HIGH (ref 70–99)
Glucose-Capillary: 160 mg/dL — ABNORMAL HIGH (ref 70–99)

## 2023-11-23 LAB — ABO/RH: ABO/RH(D): B POS

## 2023-11-23 MED ORDER — IRON SUCROSE 200 MG IVPB - SIMPLE MED
200.0000 mg | Freq: Once | Status: DC
  Filled 2023-11-23: qty 110

## 2023-11-23 MED ORDER — SODIUM CHLORIDE 0.9 % IV SOLN
200.0000 mg | Freq: Once | INTRAVENOUS | Status: AC
  Administered 2023-11-23: 200 mg via INTRAVENOUS
  Filled 2023-11-23: qty 10

## 2023-11-23 NOTE — Progress Notes (Signed)
 PROGRESS NOTE    [name]  FMW:[id number] DOB: Sep 13, 1945 DOA: 11/20/2023 PCP: [name], MD   78 y.o. female with medical history significant of breast cancer on chemotherapy since May 2025, CKD stage IIIa, chronic vertigo, hyperlipidemia, HFpEF 60 to 65%, essential hypertension, carotid stenosis, peripheral vascular disease, and prediabetes.  Currently undergoing chemotherapy for breast CA, last treatment was about 3 weeks ago.  Now presenting to the ED with dyspnea on exertion, orthopnea X 2 days and L ankle pain.  In the ER blood pressure 235/103, troponin 918, D-dimer 1.3, WBC 14, hemoglobin 9.1 platelets 409, creatinine 1.04, chest x-ray with prominent reticular opacities, mild fullness of the pulmonary vasculature, CTA chest with no PE, multiple lung nodules, small right pleural effusion, mediastinal and hilar adenopathy  -Admitted, started on diuretics - 8/15 transfuse PRBC, prednisone  for gout  Subjective: - Feels better overall, left ankle is improved  Assessment and Plan:  Hypertensive emergency Acute on chronic diastolic heart failure preservation -echo with EF 60-65%, grade 2DD, RV normal - Volume status is improved, hold further IV Lasix , creatinine has trended up, hold Aldactone  as well -continue hydralazine  dose dropped to 50 mg 3 times daily, amlodipine  10 mg, labetalol  400 mg twice daily  AKI -Suspect contrast-induced, had CTA in the ER day of admission - Creatinine continues to trend up, will request nephrology input, monitor urine output, BMP in a.m. - Also check renal ultrasound  Acute on chronic anemia -Hemoglobin down to 7.1, multifactorial secondary to chronic disease, chemotherapy - Anemia panel with iron  deficiency as well -Transfused 1 unit PRBC yesterday, add IV iron    Left ankle pain and swelling, suspected gout flare -Uric acid and ESR elevated, continue prednisone , likely triggered by chemotherapy   History of carotid artery artery  stenosis Continue Lipitor   Recent diagnosis of breast cancer on chemotherapy since May 2025 New diagnosis of breast cancer and currently on chemotherapy.  Follows [place] oncology.   Generalized anxiety disorder -Continue bupropion    Pre-diabetic - Continue heart healthy carb modified diet and sliding send insulin  with mealtime coverage  Multiple lung nodules Mediastinal and hilar adenopathy -Needs oncology follow-up in background of breast cancer     DVT prophylaxis:  SQ Heparin  Code Status:  Full Code Diet: Heart healthy carb modified diet    DVT prophylaxis: Heparin  subcutaneous Code Status: Full code Family Communication: Disposition Plan: Home improvement kidney function  Consultants:    Procedures:   Antimicrobials:    Objective: Vitals:   11/23/23 0204 11/23/23 0453 11/23/23 1038 11/23/23 1041  BP: 123/61 (!) 132/50 (!) 160/64   Pulse: (!) 56 (!) 50  60  Resp: 20 20    Temp: 98.6 F (37 C) 98.4 F (36.9 C)    TempSrc: Oral Oral    SpO2: 91% 98%    Weight:  67.8 kg    Height:        Intake/Output Summary (Last 24 hours) at 11/23/2023 1217 Last data filed at 11/23/2023 0905 Gross per 24 hour  Intake 834.58 ml  Output 150 ml  Net 684.58 ml   Filed Weights   11/20/23 1528 11/23/23 0453  Weight: 69.9 kg 67.8 kg    Examination:  General exam: Appears calm and comfortable  Respiratory system: Creased at the bases Cardiovascular system: S1 & S2 heard, RRR.  Abd: nondistended, soft and nontender.Normal bowel sounds heard. Central nervous system: Alert and oriented. No focal neurological deficits. Extremities: Trace edema Skin: No rashes Psychiatry:  Mood & affect  appropriate.     Data Reviewed:   CBC: Recent Labs  Lab 11/20/23 1533 11/21/23 0608 11/22/23 0725 11/23/23 0528  WBC 14.4* 16.3* 10.6* 10.3  HGB 9.1* 7.6* 7.1* 7.6*  HCT 29.2* 24.8* 23.7* 24.6*  MCV 91.3 91.9 94.0 89.8  PLT 409* 377 326 300   Basic Metabolic  Panel: Recent Labs  Lab 11/20/23 1103 11/20/23 1533 11/21/23 0608 11/22/23 0725 11/23/23 0528  NA 141 141 140 139 138  K 3.7 3.6 3.5 3.6 4.2  CL 107 110 110 107 106  CO2 21 20* 21* 23 21*  GLUCOSE 111* 107* 116* 109* 112*  BUN 12 12 13 20  32*  CREATININE 0.94 1.04* 1.21* 1.92* 2.75*  CALCIUM  9.2 8.9 8.4* 8.2* 8.3*   GFR: Estimated Creatinine Clearance: 16.5 mL/min (A) (by C-G formula based on SCr of 2.75 mg/dL (H)). Liver Function Tests: Recent Labs  Lab 11/20/23 1533 11/21/23 0608  AST 16 14*  ALT 11 10  ALKPHOS 100 70  BILITOT 1.0 0.8  PROT 7.0 6.3*  ALBUMIN 3.0* 2.5*   No results for input(s): LIPASE, AMYLASE in the last 168 hours. No results for input(s): AMMONIA in the last 168 hours. Coagulation Profile: No results for input(s): INR, PROTIME in the last 168 hours. Cardiac Enzymes: No results for input(s): CKTOTAL, CKMB, CKMBINDEX, TROPONINI in the last 168 hours. BNP (last 3 results) Recent Labs    11/20/23 1103  PROBNP 1,267.0*   HbA1C: No results for input(s): HGBA1C in the last 72 hours. CBG: Recent Labs  Lab 11/22/23 1217 11/22/23 1653 11/22/23 2053 11/23/23 0802 11/23/23 1141  GLUCAP 120* 181* 178* 111* 146*   Lipid Profile: No results for input(s): CHOL, HDL, LDLCALC, TRIG, CHOLHDL, LDLDIRECT in the last 72 hours. Thyroid  Function Tests: No results for input(s): TSH, T4TOTAL, FREET4, T3FREE, THYROIDAB in the last 72 hours. Anemia Panel: Recent Labs    11/21/23 1834  VITAMINB12 4,316*  FOLATE 9.6  FERRITIN 200  TIBC 204*  IRON  8*  RETICCTPCT 3.0   Urine analysis:    Component Value Date/Time   COLORURINE orange 10/28/2007 1123   APPEARANCEUR Hazy 10/28/2007 1123   LABSPEC 1.015 10/28/2007 1123   PHURINE 5.0 10/28/2007 1123   HGBUR large 10/28/2007 1123   BILIRUBINUR negative 10/28/2007 1123   UROBILINOGEN 1.0 10/28/2007 1123   NITRITE negative 10/28/2007 1123   Sepsis  Labs: @LABRCNTIP (procalcitonin:4,lacticidven:4)  )No results found for this or any previous visit (from the past 240 hours).   Radiology Studies: No results found.    Scheduled Meds:  sodium chloride    Intravenous Once   amLODipine   10 mg Oral Daily   atorvastatin   40 mg Oral Daily   buPROPion   150 mg Oral Daily   Chlorhexidine  Gluconate Cloth  6 each Topical Daily   enoxaparin  (LOVENOX ) injection  30 mg Subcutaneous Q24H   feeding supplement  237 mL Oral BID BM   hydrALAZINE   50 mg Oral TID   insulin  aspart  0-5 Units Subcutaneous QHS   insulin  aspart  0-6 Units Subcutaneous TID WC   labetalol   400 mg Oral BID   pantoprazole   40 mg Oral Daily   predniSONE   40 mg Oral Q breakfast   sodium chloride  flush  10-40 mL Intracatheter Q12H   sodium chloride  flush  3 mL Intravenous Q12H   Continuous Infusions:     LOS: 2 days    Time spent:    Sigurd Pac, MD [facility]   11/23/2023, 12:17 PM

## 2023-11-23 NOTE — Plan of Care (Signed)
   Problem: Education: Goal: Knowledge of General Education information will improve Description Including pain rating scale, medication(s)/side effects and non-pharmacologic comfort measures Outcome: Progressing

## 2023-11-23 NOTE — Consult Note (Signed)
 Renal Service Consult Note [facility]  Gwenith Tschida 11/23/2023 Lamar JONETTA Fret, MD Requesting Physician: Dr. Fairy  Reason for Consult: Renal failure HPI: The patient is a 78 y.o. year-old w/ PMH as below who presented to ED on 11/20/2023 complaining of shortness of breath for the last 3 days.  Patient has breast cancer on chemotherapy since May 2025, CKD stage IIIa/B, chronic vertigo, HL, HFpEF, hypertension, PVD and prediabetes.  She recently started chemo about 3 weeks prior to admission.  She noticed orthopnea and dyspnea also bilateral calf pain and swelling.  She received a dose of IV Lasix  in the emergency room and was urinating and her shortness of breath was improving.  In the ED blood pressure was 235/103, heart rate 105, WBC 14K, stable hemoglobin, creatinine 1.04, GFR 56.  Chest x-ray showed mildly prominent reticular opacities throughout the lungs similar to the prior study.  CTA of the chest showed no evidence of PE.  There were multiple pulm lung nodules and a right small effusion.  There was high hilar and mediastinal lymphadenopathy.  For the current setting of CHF exacerbation.  Patient received IV Lasix  60 mg, and her home labetalol  and hydralazine  were reordered.  Hospitalist was asked to admit.  Creatinine bumped up to 1.2 today after contrast, then 1.9 yesterday and 2.7 today.  We were asked to see for renal failure.   Pt seen in the room.  Patient is in good spirits, said she walked 20 steps today and she could never have done it a few days ago due to shortness of breath.  She also had orthopnea at home.  She says she has never had these problems.  Currently she is still on a small amount of nasal cannula oxygen, she says that they tried to get it off but the oxygen level must of dropped.   ROS - denies CP, no joint pain, no HA, no blurry vision, no rash, no diarrhea, no nausea/ vomiting   Past Medical History  Past Medical History:  Diagnosis Date    Absence of menstruation    amenorrhea   Angioneurotic edema not elsewhere classified    due to ACE-I   Breast cancer (HCC)    Breast disorder    breast cancer 2007   Cancer [facility]) 2007   Left Breast Cancer   Carotid stenosis    Carotid US  (8/15):  RICA 40-59%; LICA 40-59% >>> FU 1 year   Chronic kidney disease    CKD3   GERD (gastroesophageal reflux disease)    H/O: rheumatic fever    History of breast cancer 07/13/2015   HLD (hyperlipidemia)    Occlusion and stenosis of carotid artery without mention of cerebral infarction    Other abnormal glucose    fasting hyperglycemia   Peripheral vascular disease (HCC)    Carotid Stenosis   Personal history of malignant neoplasm of breast    Personal history of radiation therapy 2007   Pneumonia    Pre-diabetes    Unspecified essential hypertension    Past Surgical History  Past Surgical History:  Procedure Laterality Date   APPENDECTOMY  1972   In [place]    BREAST BIOPSY Left 12/06/2005   malignant   BREAST BIOPSY Left 08/20/2023   US  LT BREAST BX W LOC DEV 1ST LESION IMG BX SPEC US  GUIDE 08/20/2023 [facility] MAMMO[facility]APHY   BREAST LUMPECTOMY Left 01/08/2006   COLONOSCOPY  2005   negative; Dr Kristie   COLONOSCOPY N/A 09/02/2015   Procedure: COLONOSCOPY;  Surgeon: Margo LITTIE Haddock, MD;  Location: [facility]O SUITE;  Service: Endoscopy;  Laterality: N/A;  1215-moved to 1130 Ginger to notify pt   PORTACATH PLACEMENT N/A 09/18/2023   Procedure: INSERTION, TUNNELED CENTRAL VENOUS DEVICE, WITH PORT;  Surgeon: Vanderbilt Ned, MD;  Location: [facility] OR;  Service: General;  Laterality: N/A;   Family History  Family History  Problem Relation Age of Onset   Breast cancer Sister 89   Hypertension Other        some of Pts brothers and sisters   Heart attack Neg Hx    Stroke Neg Hx    Diabetes Neg Hx    Heart disease Neg Hx    Social History  reports that she has been smoking cigarettes. She has a 24 pack-year smoking history. She has been exposed to  tobacco smoke. She has never used smokeless tobacco. She reports current alcohol use of about 1.0 standard drink of alcohol per week. She reports that she does not use drugs. Allergies  Allergies  Allergen Reactions   Benazepril Hcl     REACTION: FACE SWELLING (ANGIOEDEMA); she can not take ARBS  !!! Because of a history of documented adverse serious drug reaction;Medi Alert bracelet  is recommended   Yellow Dyes (Non-Tartrazine)     12/14 hives  & facial swelling with Lemon Kool Aid   Home medications Prior to Admission medications   Medication Sig Start Date End Date Taking? Authorizing Provider  acetaminophen  (TYLENOL ) 325 MG tablet Take 650 mg by mouth every 6 (six) hours as needed for moderate pain (pain score 4-6).   Yes [provider]  amLODipine  (NORVASC ) 10 MG tablet Take 1 tablet (10 mg total) by mouth daily. For high blood pressure 11/20/23  Yes Burns, Glade PARAS, MD  atorvastatin  (LIPITOR) 40 MG tablet Take 1 tablet (40 mg total) by mouth daily. 08/14/23  Yes Burns, Glade PARAS, MD  buPROPion  (WELLBUTRIN  XL) 150 MG 24 hr tablet TAKE 1 TABLET (150 MG TOTAL) BY MOUTH IN THE MORNING DAILY. 11/20/23  Yes Burns, Glade PARAS, MD  dexamethasone  (DECADRON ) 4 MG tablet Take 2 tabs by mouth 2 times daily starting day before chemo. Then take 2 tabs daily for 2 days starting day after chemo. Take with food. 09/12/23  Yes Iruku, Praveena, MD  hydrALAZINE  (APRESOLINE ) 100 MG tablet Take 1 tablet (100 mg total) by mouth 3 (three) times daily. 11/20/23  Yes Burns, Glade PARAS, MD  labetalol  (NORMODYNE ) 200 MG tablet Take 2 tablets (400 mg total) by mouth 2 (two) times daily. 11/20/23  Yes Burns, Glade PARAS, MD  omeprazole  (PRILOSEC) 40 MG capsule TAKE 1 CAPSULE BY MOUTH EVERY DAY Patient taking differently: Take 40 mg by mouth daily as needed (for heartburn). 05/28/23  Yes Burns, Glade PARAS, MD  EPINEPHrine  0.3 mg/0.3 mL IJ SOAJ injection Inject 0.3 mg into the muscle as needed for anaphylaxis. Patient not taking:  Reported on 11/21/2023 09/12/23   Iruku, Praveena, MD  lidocaine -prilocaine  (EMLA ) cream Apply to affected area once Patient not taking: Reported on 11/21/2023 09/12/23   Iruku, Praveena, MD  ondansetron  (ZOFRAN ) 8 MG tablet Take 1 tablet (8 mg total) by mouth every 8 (eight) hours as needed for nausea or vomiting. Start on the third day after chemotherapy. Patient not taking: Reported on 11/21/2023 09/12/23   Iruku, Praveena, MD  prochlorperazine  (COMPAZINE ) 10 MG tablet Take 1 tablet (10 mg total) by mouth every 6 (six) hours as needed for nausea or vomiting. Patient not taking: Reported  on 11/21/2023 09/12/23   Iruku, Praveena, MD  spironolactone  (ALDACTONE ) 25 MG tablet Take 0.5 tablets (12.5 mg total) by mouth daily. Patient not taking: Reported on 11/21/2023 11/20/23   [name], MD  traMADol  (ULTRAM ) 50 MG tablet Take 1 tablet (50 mg total) by mouth every 6 (six) hours as needed for moderate pain (pain score 4-6) or severe pain (pain score 7-10). Patient not taking: Reported on 11/21/2023 09/18/23   Vanderbilt Ned, MD     Vitals:   11/23/23 0453 11/23/23 1038 11/23/23 1041 11/23/23 1529  BP: (!) 132/50 (!) 160/64  (!) 123/99  Pulse: (!) 50  60 (!) 58  Resp: 20   18  Temp: 98.4 F (36.9 C)   98.8 F (37.1 C)  TempSrc: Oral   Oral  SpO2: 98%   95%  Weight: 67.8 kg     Height:       Exam Gen alert, no distress, [place] O2 in place No rash, cyanosis or gangrene Sclera anicteric, throat clear  No jvd or bruits, flat neck veins Chest clear bilat to bases, no rales/ wheezing RRR no MRG Abd soft ntnd no mass or ascites +bs GU deferred MS no joint effusions or deformity Ext no LE or UE edema, no other edema Neuro is alert, Ox 3 , nf  Home bp meds: Norvasc  19 every day Hydralazine  100mg  tid Labetalol  400mg  bid Aldactone  12.5mg  every day (not taking)   Date   Creat  eGFR (ml/min) 2007- 2022  0.72- 1.26 Nov 2021  1.30 Apr 2022  1.07 Sep 2023  1.49 Jun 2025  1.22- 1.51 35- 46  ml/min Jul 2025  1.49- 1.54 11/20/23  0.94, 1.04 8/14   1.21 8/15   1.92 11/23/23  2.75  BP: lowest bp here was 123/ 99, admit bp's were 230/ 105, no hypotension I/O: 1.2 L in since admission (including prbc's) and 150 cc recorded UOP Wts: admit wt was 69.9kg, today 8/16 wt is 67.8kg IP meds of interest:  IV lasix  20- 60mg  on 8/13- 8/14 was given, one each Aldactone  was given 8/14 then dc'd IV venofer  200mg  given x 1  IV iohexol  (Omnipaque ) 50 cc given IV on 8/13 NO IVF's or boluses in ED, or since  Labs: UA pending UNa, UCr pending Renal US : 10.9/ 9.4 cm kidneys w/o hydro, + ^'d echotexture bilat Labs: Na 138, K 4.2, CO2 21 , BUN 32, creat 2.75, Hb 7.6, WBC 10K    Assessment/ Plan: AKI on CKD 3b: b/l creatinine is 1.2- 1.5 from June 2025, eGFR 35-46 ml/min.  Creat here was 1.04 on admission, and today is up to 2.75. UA pending, renal US  w/o obstruction.  Vital signs stable, no hypotension here.  She did get IV contrast on the day of admission, as well as IV Lasix .  No other nephrotoxic medications.  Suspect AKI due to contrast nephropathy.  Recommend supportive care. Still requiring oxygen so we will request fluid restriction for now.  Explained to the patient, questions answered.  Will follow.  Acute on chronic diastolic heart failure/ SOB: With vascular congestion on chest x-ray on admission.  Patient received 2 days of IV Lasix  with significant improvement in her shortness of breath.  IV Lasix  was stopped due to rising creatinine. HTN'sive emergency: echo showed G2DD, RV ok, LVEF 60-65 %. BP were adjusted per pmd.  Acute on chronic anemia: Hb down 7.1, due to chronic disease, chemotherapy. Transfuse prn.  Recent breast cancer dx: on chemoRx since may 2025  Myer Fret  MD [facility] 11/23/2023, 6:25 PM  Recent Labs  Lab 11/20/23 1533 11/21/23 0608 11/22/23 0725 11/23/23 0528  HGB 9.1* 7.6* 7.1* 7.6*  ALBUMIN 3.0* 2.5*  --   --   CALCIUM  8.9 8.4* 8.2* 8.3*  CREATININE  1.04* 1.21* 1.92* 2.75*  K 3.6 3.5 3.6 4.2   Inpatient medications:  sodium chloride    Intravenous Once   amLODipine   10 mg Oral Daily   atorvastatin   40 mg Oral Daily   buPROPion   150 mg Oral Daily   Chlorhexidine  Gluconate Cloth  6 each Topical Daily   enoxaparin  (LOVENOX ) injection  30 mg Subcutaneous Q24H   feeding supplement  237 mL Oral BID BM   hydrALAZINE   50 mg Oral TID   insulin  aspart  0-5 Units Subcutaneous QHS   insulin  aspart  0-6 Units Subcutaneous TID WC   labetalol   400 mg Oral BID   pantoprazole   40 mg Oral Daily   predniSONE   40 mg Oral Q breakfast   sodium chloride  flush  10-40 mL Intracatheter Q12H   sodium chloride  flush  3 mL Intravenous Q12H    acetaminophen  **OR** acetaminophen , morphine  injection, ondansetron  **OR** ondansetron  (ZOFRAN ) IV, sodium chloride  flush, sodium chloride  flush

## 2023-11-24 DIAGNOSIS — I161 Hypertensive emergency: Secondary | ICD-10-CM | POA: Diagnosis not present

## 2023-11-24 LAB — URINALYSIS, ROUTINE W REFLEX MICROSCOPIC
Bilirubin Urine: NEGATIVE
Glucose, UA: NEGATIVE mg/dL
Hgb urine dipstick: NEGATIVE
Ketones, ur: 5 mg/dL — AB
Leukocytes,Ua: NEGATIVE
Nitrite: NEGATIVE
Protein, ur: >=300 mg/dL — AB
Specific Gravity, Urine: 1.017 (ref 1.005–1.030)
pH: 5 (ref 5.0–8.0)

## 2023-11-24 LAB — BASIC METABOLIC PANEL WITH GFR
Anion gap: 12 (ref 5–15)
BUN: 48 mg/dL — ABNORMAL HIGH (ref 8–23)
CO2: 21 mmol/L — ABNORMAL LOW (ref 22–32)
Calcium: 8.5 mg/dL — ABNORMAL LOW (ref 8.9–10.3)
Chloride: 102 mmol/L (ref 98–111)
Creatinine, Ser: 2.79 mg/dL — ABNORMAL HIGH (ref 0.44–1.00)
GFR, Estimated: 17 mL/min — ABNORMAL LOW (ref >60)
Glucose, Bld: 113 mg/dL — ABNORMAL HIGH (ref 70–99)
Potassium: 4.4 mmol/L (ref 3.5–5.1)
Sodium: 135 mmol/L (ref 135–145)

## 2023-11-24 LAB — CBC
HCT: 24.6 % — ABNORMAL LOW (ref 36.0–46.0)
Hemoglobin: 7.6 g/dL — ABNORMAL LOW (ref 12.0–15.0)
MCH: 27.9 pg (ref 26.0–34.0)
MCHC: 30.9 g/dL (ref 30.0–36.0)
MCV: 90.4 fL (ref 80.0–100.0)
Platelets: 304 K/uL (ref 150–400)
RBC: 2.72 MIL/uL — ABNORMAL LOW (ref 3.87–5.11)
RDW: 19.2 % — ABNORMAL HIGH (ref 11.5–15.5)
WBC: 9.3 K/uL (ref 4.0–10.5)
nRBC: 0 % (ref 0.0–0.2)

## 2023-11-24 LAB — SODIUM, URINE, RANDOM: Sodium, Ur: <10 mmol/L

## 2023-11-24 LAB — GLUCOSE, CAPILLARY
Glucose-Capillary: 108 mg/dL — ABNORMAL HIGH (ref 70–99)
Glucose-Capillary: 129 mg/dL — ABNORMAL HIGH (ref 70–99)
Glucose-Capillary: 166 mg/dL — ABNORMAL HIGH (ref 70–99)
Glucose-Capillary: 128 mg/dL — ABNORMAL HIGH (ref 70–99)

## 2023-11-24 LAB — CREATININE, URINE, RANDOM: Creatinine, Urine: 219 mg/dL

## 2023-11-24 MED ORDER — IRON SUCROSE 200 MG IVPB - SIMPLE MED
200.0000 mg | Freq: Once | Status: AC
  Administered 2023-11-24: 200 mg via INTRAVENOUS
  Filled 2023-11-24: qty 200

## 2023-11-24 MED ORDER — PREDNISONE 20 MG PO TABS
30.0000 mg | ORAL_TABLET | Freq: Every day | ORAL | Status: AC
  Administered 2023-11-25 – 2023-11-26 (×2): 30 mg via ORAL
  Filled 2023-11-24: qty 1
  Filled 2023-11-24: qty 2

## 2023-11-24 MED ORDER — LABETALOL HCL 200 MG PO TABS
200.0000 mg | ORAL_TABLET | Freq: Two times a day (BID) | ORAL | Status: DC
  Administered 2023-11-24 – 2023-11-25 (×4): 200 mg via ORAL
  Filled 2023-11-24 (×4): qty 1

## 2023-11-24 MED ORDER — HYDRALAZINE HCL 25 MG PO TABS
25.0000 mg | ORAL_TABLET | Freq: Three times a day (TID) | ORAL | Status: DC
  Administered 2023-11-24 – 2023-11-26 (×7): 25 mg via ORAL
  Filled 2023-11-24 (×7): qty 1

## 2023-11-24 MED ORDER — SENNA 8.6 MG PO TABS
2.0000 | ORAL_TABLET | Freq: Every day | ORAL | Status: DC | PRN
  Administered 2023-11-24 – 2023-11-25 (×2): 17.2 mg via ORAL
  Filled 2023-11-24 (×2): qty 2

## 2023-11-24 NOTE — Plan of Care (Signed)
   Problem: Education: Goal: Knowledge of General Education information will improve Description Including pain rating scale, medication(s)/side effects and non-pharmacologic comfort measures Outcome: Progressing

## 2023-11-24 NOTE — Progress Notes (Signed)
 [name] Progress Note  Subjective:  Seen in room Creat stable at 2.79 today Not recording all her UOP  Vitals:   11/24/23 0420 11/24/23 0829 11/24/23 1047 11/24/23 1155  BP: (!) 137/58 (!) 135/56 (!) 160/60 (!) 135/58  Pulse: (!) 53  60 (!) 54  Resp: 19 18  18   Temp: 98.1 F (36.7 C) 98.3 F (36.8 C)  97.8 F (36.6 C)  TempSrc: Oral Oral  Oral  SpO2: 95% 98%  97%  Weight: 70.1 kg     Height:        Exam: Gen alert, no distress, [name] O2 in place No jvd or bruits Chest clear bilat to bases RRR no MRG Abd soft ntnd no mass or ascites +bs Ext no LE edema, no other edema Neuro is alert, Ox 3 , nf   Home bp meds: Norvasc  19 every day Hydralazine  100mg  tid Labetalol  400mg  bid Aldactone  12.5mg  every day (not taking)     Date                             Creat               eGFR (ml/min) 2007- 2022                  0.72- 1.20 2023                         1.22 2024                            1.31 May-July 2025             1.22- 1.54     35- 46 ml/min                     11/20/23                        0.94, 1.04 8/14                             1.21 8/15                             1.92 11/23/23                        2.75    IP meds of interest:  IV lasix  20- 60mg  on 8/13- 8/14 was given, one each Aldactone  was given 8/14 then dc'd  IV iohexol  (Omnipaque ) 50 cc given IV on 8/13   Labs: UA prot > 300, 0-5 rbc/ wbc, 11-20 epis UNa < 10, UCr 219 Renal US : 10.9/ 9.4 cm kidneys w/o hydro, + ^'d echotexture bilat Labs: Na 138, K 4.2, CO2 21 , BUN 32, creat 2.75, Hb 7.6, WBC 10K       Assessment/ Plan: AKI on CKD 3b: b/l creatinine is 1.2- 1.5 from June 2025, eGFR 35-46 ml/min.  Creat here was 1.04 on admission, and today is up to 2.75. UA pending, renal US  w/o obstruction.  Vital signs stable, no hypotension here.  Got IV contrast on DOA, also IV Lasix .  No other nephrotoxic medications.  Suspect AKI due to contrast nephropathy. Supportive care was  recommended. Creat  today is stable, may be leveling off. F/u labs in am.  Acute on chronic diastolic heart failure/ SOB: With vascular congestion on chest x-ray on admission.  Patient received 2 days of IV Lasix  with significant improvement w/ regards to SOB. IV Lasix  was stopped due to rising creatinine. HTN'sive emergency: echo showed G2DD, RV ok, LVEF 60-65 %. BP were adjusted per pmd.  Acute on chronic anemia: Hb down 7.5, hx chronic disease, chemotherapy. Transfuse prn.  Recent breast cancer dx: on chemoRx since may 2025             Kimberly Fret MD  [facility] 11/24/2023, 2:17 PM  Recent Labs  Lab 11/20/23 1533 11/21/23 0608 11/22/23 0725 11/23/23 0528 11/24/23 0610  HGB 9.1* 7.6*   < > 7.6* 7.6*  ALBUMIN 3.0* 2.5*  --   --   --   CALCIUM  8.9 8.4*   < > 8.3* 8.5*  CREATININE 1.04* 1.21*   < > 2.75* 2.79*  K 3.6 3.5   < > 4.2 4.4   < > = values in this interval not displayed.   Recent Labs  Lab 11/21/23 1834  IRON  8*  TIBC 204*  FERRITIN 200   Inpatient medications:  atorvastatin   40 mg Oral Daily   buPROPion   150 mg Oral Daily   Chlorhexidine  Gluconate Cloth  6 each Topical Daily   enoxaparin  (LOVENOX ) injection  30 mg Subcutaneous Q24H   feeding supplement  237 mL Oral BID BM   hydrALAZINE   25 mg Oral TID   insulin  aspart  0-5 Units Subcutaneous QHS   insulin  aspart  0-6 Units Subcutaneous TID WC   labetalol   200 mg Oral BID   pantoprazole   40 mg Oral Daily   [START ON 11/25/2023] predniSONE   30 mg Oral Q breakfast   sodium chloride  flush  10-40 mL Intracatheter Q12H   sodium chloride  flush  3 mL Intravenous Q12H    iron  sucrose 200 mg (11/24/23 1416)   acetaminophen  **OR** acetaminophen , morphine  injection, ondansetron  **OR** ondansetron  (ZOFRAN ) IV, senna, sodium chloride  flush, sodium chloride  flush

## 2023-11-24 NOTE — Progress Notes (Signed)
 PROGRESS NOTE    [name]  FMW:[id number] DOB: [birth year] DOA: 11/20/2023 PCP: [name], MD   78 y.o. female with medical history significant of breast cancer on chemotherapy since May 2025, CKD stage IIIa, chronic vertigo, hyperlipidemia, HFpEF 60 to 65%, essential hypertension, carotid stenosis, peripheral vascular disease, and prediabetes.  Currently undergoing chemotherapy for breast CA, last treatment was about 3 weeks ago.  Now presenting to the ED with dyspnea on exertion, orthopnea X 2 days and L ankle pain.  In the ER blood pressure 235/103, troponin 918, D-dimer 1.3, WBC 14, hemoglobin 9.1 platelets 409, creatinine 1.04, chest x-ray with prominent reticular opacities, mild fullness of the pulmonary vasculature, CTA chest with no PE, multiple lung nodules, small right pleural effusion, mediastinal and hilar adenopathy  -Admitted, started on diuretics - 8/15 transfuse PRBC, prednisone  for gout - Creatinine worsening, concern for contrast nephropathy, nephrology consulted  Subjective: - As well, no complaints  Assessment and Plan:  Hypertensive emergency Acute on chronic diastolic heart failure preservation -echo with EF 60-65%, grade 2DD, RV normal - Volume status is improved, hold further IV Lasix , creatinine has trended up, hold Aldactone  as well - Hydralazine  and labetalol  dose decreased, amlodipine  discontinued   AKI -Suspect contrast-induced, had CTA in the ER day of admission - Trended up to 2.7 yesterday, baseline is normal, now appears to be plateauing, renal ultrasound negative for hydronephrosis Appreciate nephrology input  Acute on chronic anemia -Hemoglobin down to 7.1, multifactorial secondary to chronic disease, chemotherapy - Anemia panel with iron  deficiency as well -Transfused 1 unit PRBC yesterday, given IV iron    Left ankle pain and swelling, suspected gout flare -Uric acid and ESR elevated, continue prednisone , likely triggered by  chemotherapy -Cut down prednisone  dose   History of carotid artery artery stenosis Continue Lipitor   Recent diagnosis of breast cancer on chemotherapy since May 2025 New diagnosis of breast cancer and currently on chemotherapy.  Follows [place] oncology.   Generalized anxiety disorder -Continue bupropion    Pre-diabetic - Continue heart healthy carb modified diet and sliding send insulin  with mealtime coverage  Multiple lung nodules Mediastinal and hilar adenopathy -Needs oncology follow-up in background of breast cancer     DVT prophylaxis:  SQ Heparin  Code Status:  Full Code Diet: Heart healthy carb modified diet    DVT prophylaxis: Heparin  subcutaneous Code Status: Full code Family Communication: Disposition Plan: Home improvement kidney function  Consultants:    Procedures:   Antimicrobials:    Objective: Vitals:   11/23/23 2300 11/24/23 0420 11/24/23 0829 11/24/23 1047  BP: 113/62 (!) 137/58 (!) 135/56 (!) 160/60  Pulse: (!) 57 (!) 53  60  Resp: 18 19 18    Temp: 98.4 F (36.9 C) 98.1 F (36.7 C) 98.3 F (36.8 C)   TempSrc: Oral Oral Oral   SpO2: 96% 95% 98%   Weight:  70.1 kg    Height:        Intake/Output Summary (Last 24 hours) at 11/24/2023 1130 Last data filed at 11/23/2023 2300 Gross per 24 hour  Intake 130 ml  Output --  Net 130 ml   Filed Weights   11/20/23 1528 11/23/23 0453 11/24/23 0420  Weight: 69.9 kg 67.8 kg 70.1 kg    Examination:  General exam: Appears calm and comfortable  Respiratory system: Creased at the bases Cardiovascular system: S1 & S2 heard, RRR.  Abd: nondistended, soft and nontender.Normal bowel sounds heard. Central nervous system: Alert and oriented. No focal neurological deficits. Extremities: Trace  edema Skin: No rashes Psychiatry:  Mood & affect appropriate.     Data Reviewed:   CBC: Recent Labs  Lab 11/20/23 1533 11/21/23 0608 11/22/23 0725 11/23/23 0528 11/24/23 0610  WBC 14.4* 16.3*  10.6* 10.3 9.3  HGB 9.1* 7.6* 7.1* 7.6* 7.6*  HCT 29.2* 24.8* 23.7* 24.6* 24.6*  MCV 91.3 91.9 94.0 89.8 90.4  PLT 409* 377 326 300 304   Basic Metabolic Panel: Recent Labs  Lab 11/20/23 1533 11/21/23 0608 11/22/23 0725 11/23/23 0528 11/24/23 0610  NA 141 140 139 138 135  K 3.6 3.5 3.6 4.2 4.4  CL 110 110 107 106 102  CO2 20* 21* 23 21* 21*  GLUCOSE 107* 116* 109* 112* 113*  BUN 12 13 20  32* 48*  CREATININE 1.04* 1.21* 1.92* 2.75* 2.79*  CALCIUM  8.9 8.4* 8.2* 8.3* 8.5*   GFR: Estimated Creatinine Clearance: 16.5 mL/min (A) (by C-G formula based on SCr of 2.79 mg/dL (H)). Liver Function Tests: Recent Labs  Lab 11/20/23 1533 11/21/23 0608  AST 16 14*  ALT 11 10  ALKPHOS 100 70  BILITOT 1.0 0.8  PROT 7.0 6.3*  ALBUMIN 3.0* 2.5*   No results for input(s): LIPASE, AMYLASE in the last 168 hours. No results for input(s): AMMONIA in the last 168 hours. Coagulation Profile: No results for input(s): INR, PROTIME in the last 168 hours. Cardiac Enzymes: No results for input(s): CKTOTAL, CKMB, CKMBINDEX, TROPONINI in the last 168 hours. BNP (last 3 results) Recent Labs    11/20/23 1103  PROBNP 1,267.0*   HbA1C: No results for input(s): HGBA1C in the last 72 hours. CBG: Recent Labs  Lab 11/23/23 0802 11/23/23 1141 11/23/23 1747 11/23/23 2118 11/24/23 0832  GLUCAP 111* 146* 160* 160* 108*   Lipid Profile: No results for input(s): CHOL, HDL, LDLCALC, TRIG, CHOLHDL, LDLDIRECT in the last 72 hours. Thyroid  Function Tests: No results for input(s): TSH, T4TOTAL, FREET4, T3FREE, THYROIDAB in the last 72 hours. Anemia Panel: Recent Labs    11/21/23 1834  VITAMINB12 4,316*  FOLATE 9.6  FERRITIN 200  TIBC 204*  IRON  8*  RETICCTPCT 3.0   Urine analysis:    Component Value Date/Time   COLORURINE AMBER (A) 11/23/2023 2335   APPEARANCEUR HAZY (A) 11/23/2023 2335   LABSPEC 1.017 11/23/2023 2335   PHURINE 5.0 11/23/2023  2335   GLUCOSEU NEGATIVE 11/23/2023 2335   HGBUR NEGATIVE 11/23/2023 2335   HGBUR large 10/28/2007 1123   BILIRUBINUR NEGATIVE 11/23/2023 2335   KETONESUR 5 (A) 11/23/2023 2335   PROTEINUR >=300 (A) 11/23/2023 2335   UROBILINOGEN 1.0 10/28/2007 1123   NITRITE NEGATIVE 11/23/2023 2335   LEUKOCYTESUR NEGATIVE 11/23/2023 2335   Sepsis Labs: @LABRCNTIP (procalcitonin:4,lacticidven:4)  )No results found for this or any previous visit (from the past 240 hours).   Radiology Studies: US  RENAL Result Date: 11/23/2023 CLINICAL DATA:  Acute kidney injury. EXAM: RENAL / URINARY TRACT ULTRASOUND COMPLETE COMPARISON:  None Available. FINDINGS: Right Kidney: Renal measurements: 10.9 x 3.1 x 4.8 cm = volume: 85.4 mL. Increased parenchymal echogenicity. A cysts are present in the upper pole measuring up to 1 cm. No mass or hydronephrosis visualized. Left Kidney: Renal measurements: 9.4 x 5.0 x 5.0 cm = volume: 123.5 mL. Examination is limited to due to obscuration due to bowel, gas, or mass in this region. A cyst is noted in the midpole measuring up to 0.2 cm. No mass or hydronephrosis visualized. Bladder: Bladder is not seen and may be nondistended. Other: None. IMPRESSION: 1. Increased renal echogenicity bilaterally  with bilateral renal cysts, compatible with medical renal disease. 2. Bladder is not seen. Electronically Signed   By: Leita Birmingham M.D.   On: 11/23/2023 14:41      Scheduled Meds:  sodium chloride    Intravenous Once   atorvastatin   40 mg Oral Daily   buPROPion   150 mg Oral Daily   Chlorhexidine  Gluconate Cloth  6 each Topical Daily   enoxaparin  (LOVENOX ) injection  30 mg Subcutaneous Q24H   feeding supplement  237 mL Oral BID BM   hydrALAZINE   25 mg Oral TID   insulin  aspart  0-5 Units Subcutaneous QHS   insulin  aspart  0-6 Units Subcutaneous TID WC   labetalol   200 mg Oral BID   pantoprazole   40 mg Oral Daily   predniSONE   40 mg Oral Q breakfast   sodium chloride  flush  10-40 mL  Intracatheter Q12H   sodium chloride  flush  3 mL Intravenous Q12H   Continuous Infusions:     LOS: 3 days    Time spent:    Sigurd Pac, MD [facility]   11/24/2023, 11:30 AM

## 2023-11-25 ENCOUNTER — Encounter (HOSPITAL_COMMUNITY): Payer: Self-pay | Admitting: Internal Medicine

## 2023-11-25 DIAGNOSIS — I161 Hypertensive emergency: Secondary | ICD-10-CM | POA: Diagnosis not present

## 2023-11-25 LAB — BASIC METABOLIC PANEL WITH GFR
Anion gap: 11 (ref 5–15)
BUN: 54 mg/dL — ABNORMAL HIGH (ref 8–23)
CO2: 23 mmol/L (ref 22–32)
Calcium: 8.7 mg/dL — ABNORMAL LOW (ref 8.9–10.3)
Chloride: 106 mmol/L (ref 98–111)
Creatinine, Ser: 2.33 mg/dL — ABNORMAL HIGH (ref 0.44–1.00)
GFR, Estimated: 21 mL/min — ABNORMAL LOW (ref >60)
Glucose, Bld: 97 mg/dL (ref 70–99)
Potassium: 4.1 mmol/L (ref 3.5–5.1)
Sodium: 140 mmol/L (ref 135–145)

## 2023-11-25 LAB — GLUCOSE, CAPILLARY
Glucose-Capillary: 90 mg/dL (ref 70–99)
Glucose-Capillary: 106 mg/dL — ABNORMAL HIGH (ref 70–99)
Glucose-Capillary: 134 mg/dL — ABNORMAL HIGH (ref 70–99)
Glucose-Capillary: 183 mg/dL — ABNORMAL HIGH (ref 70–99)
Glucose-Capillary: 118 mg/dL — ABNORMAL HIGH (ref 70–99)

## 2023-11-25 LAB — CBC
HCT: 24.7 % — ABNORMAL LOW (ref 36.0–46.0)
Hemoglobin: 7.6 g/dL — ABNORMAL LOW (ref 12.0–15.0)
MCH: 27.7 pg (ref 26.0–34.0)
MCHC: 30.8 g/dL (ref 30.0–36.0)
MCV: 90.1 fL (ref 80.0–100.0)
Platelets: 338 K/uL (ref 150–400)
RBC: 2.74 MIL/uL — ABNORMAL LOW (ref 3.87–5.11)
RDW: 18.8 % — ABNORMAL HIGH (ref 11.5–15.5)
WBC: 8.5 K/uL (ref 4.0–10.5)
nRBC: 0 % (ref 0.0–0.2)

## 2023-11-25 NOTE — Progress Notes (Signed)
 Heart Failure Nurse Navigator Progress Note  PCP: [name], MD PCP-Cardiologist: Wonda Admission Diagnosis: Acute congestive heart failure, Hypertensive emergency.  Admitted from: Home  Presentation:   [name] presented with shortness of breath x 3 days,bilateral leg edema,  went to her doctor , they told her to go to the ED. Hx of breast cancer, Patient reports she has had 3 chemo treatments with the last one being 20 days ago for her breast cancer. BP 231/107, HR 101, BNP 918.9, Troponin 31, , Chest x-ray showing Mildly prominent reticular opacities throughout the lungs, similar to the prior study. . Mild fullness of the central pulmonary vasculature .CTA chest with no PE, multiple lung nodules, small right pleural effusion, mediastinal and hilar adenopathy.   Patient was educated on the sign and symptoms of heart failure, daily weights, when to call his doctor or go to the ED. Diet/ fluid restrictions, Patient reports to salt use, states she usually soaks her chicken in salt water  before cooking it. Continued education on taking all medications as prescribed and attending all medical appointments. Patient verbalized her understanding of education . A HF TOC appointment was scheduled for 11/29/2023 @ 8:15 am per Dr. Fairy.   ECHO/ LVEF: 60-65%  Clinical Course:  Past Medical History:  Diagnosis Date   Absence of menstruation    amenorrhea   Angioneurotic edema not elsewhere classified    due to ACE-I   Breast cancer (HCC)    Breast disorder    breast cancer 2007   Cancer [facility]) 2007   Left Breast Cancer   Carotid stenosis    Carotid US  (8/15):  RICA 40-59%; LICA 40-59% >>> FU 1 year   Chronic kidney disease    CKD3   GERD (gastroesophageal reflux disease)    H/O: rheumatic fever    History of breast cancer 07/13/2015   HLD (hyperlipidemia)    Occlusion and stenosis of carotid artery without mention of cerebral infarction    Other abnormal glucose    fasting  hyperglycemia   Peripheral vascular disease (HCC)    Carotid Stenosis   Personal history of malignant neoplasm of breast    Personal history of radiation therapy 2007   Pneumonia    Pre-diabetes    Unspecified essential hypertension      Social History   Socioeconomic History   Marital status: Single    Spouse name: Not on file   Number of children: Not on file   Years of education: Not on file   Highest education level: Not on file  Occupational History   Not on file  Tobacco Use   Smoking status: Every Day    Current packs/day: 0.50    Average packs/day: 0.5 packs/day for 48.0 years (24.0 ttl pk-yrs)    Types: Cigarettes    Passive exposure: Current   Smokeless tobacco: Never   Tobacco comments:    Smokes daily.    Vaping Use   Vaping status: Never Used  Substance and Sexual Activity   Alcohol use: Yes    Alcohol/week: 1.0 standard drink of alcohol    Types: 1 Standard drinks or equivalent per week    Comment: occasionally   Drug use: No   Sexual activity: Yes    Birth control/protection: Post-menopausal  Other Topics Concern   Not on file  Social History Narrative   Single, no children.   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    Social Drivers of Corporate investment banker  Strain: Medium Risk (05/31/2023)   Overall Financial Resource Strain (CARDIA)    Difficulty of Paying Living Expenses: Somewhat hard  Food Insecurity: No Food Insecurity (11/22/2023)   Hunger Vital Sign    Worried About Running Out of Food in the Last Year: Never true    Ran Out of Food in the Last Year: Never true  Transportation Needs: No Transportation Needs (11/22/2023)   PRAPARE - Administrator, Civil Service (Medical): No    Lack of Transportation (Non-Medical): No  Physical Activity: Sufficiently Active (05/31/2023)   Exercise Vital Sign    Days of Exercise per Week: 5 days    Minutes of Exercise per Session: 60 min  Stress: No Stress Concern Present  (05/31/2023)   [employer] of Occupational Health - Occupational Stress Questionnaire    Feeling of Stress : Not at all  Social Connections: Socially Isolated (11/22/2023)   Social Connection and Isolation Panel    Frequency of Communication with Friends and Family: Twice a week    Frequency of Social Gatherings with Friends and Family: Once a week    Attends Religious Services: Never    Database administrator or Organizations: No    Attends Engineer, structural: Never    Marital Status: Never married   Education Assessment and Provision:  Detailed education and instructions provided on heart failure disease management including the following:  Signs and symptoms of Heart Failure When to call the physician Importance of daily weights Low sodium diet Fluid restriction Medication management Anticipated future follow-up appointments  Patient education given on each of the above topics.  Patient acknowledges understanding via teach back method and acceptance of all instructions.  Education Materials:  Living Better With Heart Failure Booklet, HF zone tool, & Daily Weight Tracker Tool.  Patient has scale at home: Yes Patient has pill box at home: Yes    High Risk Criteria for Readmission and/or Poor Patient Outcomes: Heart failure hospital admissions (last 6 months): 1  No Show rate: 9% Difficult social situation: No, lives alone Demonstrates medication adherence: Yes Primary Language: English  Literacy level: Reading, writing, and comprehension   Barriers of Care:   Diet/ fluid restrictions ( salt) Daily weights  Considerations/Referrals:   Referral made to Heart Failure Pharmacist Stewardship: NA Referral made to Heart Failure CSW/NCM TOC: NA Referral made to Heart & Vascular TOC clinic: Yes, 11/29/2023 @ 8;15 am per Dr. Fairy  Items for Follow-up on DC/TOC: Continued HF education  Diet/ fluid restrictions, daily weights   Stephane Haddock, BSN, RN Heart  Failure Print production planner Chat Only

## 2023-11-25 NOTE — Progress Notes (Signed)
 Nephrology Follow-Up Consult note   Assessment/Recommendations: [name] is a/an 78 y.o. female with a past medical history significant for breast cancer on chemotherapy, CKD stage IIIa, CHF, HTN, admitted for heart failure exacerbation complicated by AKI.       Nonoliguric AKI on CKD 3 A: Baseline creatinine 1.2-1.5.  Creatinine low on admission but went up to 2.8.  Possibly related to contrast and some cardiorenal syndrome. -Creatinine improving with supportive care -Continue to monitor daily Cr, Dose meds for GFR -Monitor Daily I/Os, Daily weight  -Maintain MAP>65 for optimal renal perfusion.  -Avoid nephrotoxic medications including NSAIDs -Use synthetic opioids (Fentanyl /Dilaudid) if needed -Currently no indication for HD  Diastolic heart failure exacerbation: With some signs of volume overload.  Received IV Lasix  with diuresis now off at this time.  Volume status much improved  Hypertension: Significant elevation on admission.  Blood pressure improved.  Continue current medications  Anemia: Multifactorial.  Transfusions per primary team.  Avoid ESA in the setting of cancer Breast cancer: Active disease on chemotherapy   Recommendations conveyed to primary service.    [name] 11/25/2023 11:13 AM  ___________________________________________________________  CC: Shortness of breath  Interval History/Subjective: Patient feels well with no complaints   Medications:  Current Facility-Administered Medications  Medication Dose Route Frequency Provider Last Rate Last Admin   acetaminophen  (TYLENOL ) tablet 650 mg  650 mg Oral Q6H PRN Sundil, Subrina, MD   650 mg at 11/21/23 1836   Or   acetaminophen  (TYLENOL ) suppository 650 mg  650 mg Rectal Q6H PRN Sundil, Subrina, MD       atorvastatin  (LIPITOR) tablet 40 mg  40 mg Oral Daily Sundil, Subrina, MD   40 mg at 11/25/23 1020   buPROPion  (WELLBUTRIN  XL) 24 hr tablet 150 mg  150 mg Oral  Daily Sundil, Subrina, MD   150 mg at 11/25/23 1020   Chlorhexidine  Gluconate Cloth 2 % PADS 6 each  6 each Topical Daily Sundil, Subrina, MD   6 each at 11/24/23 0940   enoxaparin  (LOVENOX ) injection 30 mg  30 mg Subcutaneous Q24H Joseph, Preetha, MD   30 mg at 11/25/23 1022   feeding supplement (ENSURE PLUS HIGH PROTEIN) liquid 237 mL  237 mL Oral BID BM Fairy Frames, MD   237 mL at 11/24/23 1046   hydrALAZINE  (APRESOLINE ) tablet 25 mg  25 mg Oral TID Joseph, Preetha, MD   25 mg at 11/25/23 1020   insulin  aspart (novoLOG ) injection 0-5 Units  0-5 Units Subcutaneous QHS Sundil, Subrina, MD       insulin  aspart (novoLOG ) injection 0-6 Units  0-6 Units Subcutaneous TID WC Sundil, Subrina, MD   1 Units at 11/22/23 1757   labetalol  (NORMODYNE ) tablet 200 mg  200 mg Oral BID Joseph, Preetha, MD   200 mg at 11/25/23 1020   morphine  (PF) 2 MG/ML injection 1 mg  1 mg Intravenous Q4H PRN Joseph, Preetha, MD       ondansetron  (ZOFRAN ) tablet 4 mg  4 mg Oral Q6H PRN Sundil, Subrina, MD       Or   ondansetron  (ZOFRAN ) injection 4 mg  4 mg Intravenous Q6H PRN Sundil, Subrina, MD   4 mg at 11/21/23 2233   pantoprazole  (PROTONIX ) EC tablet 40 mg  40 mg Oral Daily Sundil, Subrina, MD   40 mg at 11/25/23 1020   predniSONE  (DELTASONE ) tablet 30 mg  30 mg Oral Q breakfast Fairy Frames, MD   30 mg at 11/25/23 1019  senna (SENOKOT) tablet 17.2 mg  2 tablet Oral Daily PRN Opyd, Evalene RAMAN, MD   17.2 mg at 11/24/23 0113   sodium chloride  flush (NS) 0.9 % injection 10-40 mL  10-40 mL Intracatheter Q12H Sundil, Subrina, MD   10 mL at 11/25/23 1024   sodium chloride  flush (NS) 0.9 % injection 10-40 mL  10-40 mL Intracatheter PRN Lee, Subrina, MD       sodium chloride  flush (NS) 0.9 % injection 3 mL  3 mL Intravenous Q12H Sundil, Subrina, MD   3 mL at 11/22/23 2238   sodium chloride  flush (NS) 0.9 % injection 3 mL  3 mL Intravenous PRN Sundil, Subrina, MD          Review of Systems: 10 systems reviewed and  negative except per interval history/subjective  Physical Exam: Vitals:   11/25/23 0838 11/25/23 1020  BP: (!) 144/59 (!) 149/56  Pulse:  65  Resp: 18   Temp: 98.5 F (36.9 C)   SpO2: 97% 93%   Total I/O In: 240 [P.O.:240] Out: -   Intake/Output Summary (Last 24 hours) at 11/25/2023 1113 Last data filed at 11/25/2023 1000 Gross per 24 hour  Intake 847 ml  Output 550 ml  Net 297 ml   Constitutional: well-appearing, no acute distress ENMT: ears and nose without scars or lesions, MMM CV: normal rate, no edema Respiratory: clear to auscultation, normal work of breathing Gastrointestinal: soft, non-tender, no palpable masses or hernias Skin: no visible lesions or rashes Psych: alert, judgement/insight appropriate, appropriate mood and affect   Test Results I personally reviewed new and old clinical labs and radiology tests Lab Results  Component Value Date   NA 140 11/25/2023   K 4.1 11/25/2023   CL 106 11/25/2023   CO2 23 11/25/2023   BUN 54 (H) 11/25/2023   CREATININE 2.33 (H) 11/25/2023   GFR 58.96 (L) 11/20/2023   CALCIUM  8.7 (L) 11/25/2023   ALBUMIN 2.5 (L) 11/21/2023    CBC Recent Labs  Lab 11/23/23 0528 11/24/23 0610 11/25/23 0601  WBC 10.3 9.3 8.5  HGB 7.6* 7.6* 7.6*  HCT 24.6* 24.6* 24.7*  MCV 89.8 90.4 90.1  PLT 300 304 338

## 2023-11-25 NOTE — Progress Notes (Addendum)
 PROGRESS NOTE    [name]  FMW:[id number] DOB: June 10, 1945 DOA: 11/20/2023 PCP: [name], MD   78 y.o. female with medical history significant of breast cancer on chemotherapy since May 2025, CKD stage IIIa, chronic vertigo, hyperlipidemia, HFpEF 60 to 65%, essential hypertension, carotid stenosis, peripheral vascular disease, and prediabetes.  Currently undergoing chemotherapy for breast CA, last treatment was about 3 weeks ago.  Now presenting to the ED with dyspnea on exertion, orthopnea X 2 days and L ankle pain.  In the ER blood pressure 235/103, troponin 918, D-dimer 1.3, WBC 14, hemoglobin 9.1 platelets 409, creatinine 1.04, chest x-ray with prominent reticular opacities, mild fullness of the pulmonary vasculature, CTA chest with no PE, multiple lung nodules, small right pleural effusion, mediastinal and hilar adenopathy  -Admitted, started on diuretics - 8/15 transfuse PRBC, prednisone  for gout - Creatinine worsening, concern for contrast nephropathy, nephrology consulted  Subjective: - As well, no complaints  Assessment and Plan:  Hypertensive emergency Acute on chronic diastolic heart failure preservation -echo with EF 60-65%, grade 2DD, RV normal - Volume status is improved, then complicated by AKI, Lasix  and Aldactone  held - Hydralazine  and labetalol  dose decreased, amlodipine  discontinued   AKI -Suspect contrast-induced, had CTA in the ER day of admission - Trended up to 2.7, baseline is normal, now appears to be improving, renal ultrasound negative for hydronephrosis Appreciate nephrology input  Acute on chronic anemia -Hemoglobin down to 7.1, multifactorial secondary to chronic disease, chemotherapy - Anemia panel with iron  deficiency as well -Transfused 1 unit PRBC, given IV iron  - Hemoglobin now stable in the 7.6 range   Left ankle pain and swelling, suspected gout flare -Uric acid and ESR elevated, continue prednisone , likely triggered by  chemotherapy -Cut down prednisone  dose   History of carotid artery artery stenosis Continue Lipitor   Recent diagnosis of breast cancer on chemotherapy since May 2025 New diagnosis of breast cancer and currently on chemotherapy.  Follows [facility].   Generalized anxiety disorder -Continue bupropion    Pre-diabetic - Continue heart healthy carb modified diet and sliding send insulin  with mealtime coverage  Multiple lung nodules Mediastinal and hilar adenopathy -Needs oncology follow-up in background of breast cancer     DVT prophylaxis:  SQ Heparin  Code Status:  Full Code Diet: Heart healthy carb modified diet    DVT prophylaxis: Heparin  subcutaneous Code Status: Full code Family Communication: Disposition Plan: Home improvement kidney function  Consultants:    Procedures:   Antimicrobials:    Objective: Vitals:   11/25/23 0551 11/25/23 0838 11/25/23 1020 11/25/23 1214  BP: (!) 154/61 (!) 144/59 (!) 149/56 (!) 144/89  Pulse: (!) 53  65   Resp: 18 18  18   Temp: 98.7 F (37.1 C) 98.5 F (36.9 C)  98 F (36.7 C)  TempSrc: Oral Oral  Oral  SpO2: 96% 97% 93% 95%  Weight: 70.1 kg     Height:        Intake/Output Summary (Last 24 hours) at 11/25/2023 1235 Last data filed at 11/25/2023 1000 Gross per 24 hour  Intake 847 ml  Output 550 ml  Net 297 ml   Filed Weights   11/23/23 0453 11/24/23 0420 11/25/23 0551  Weight: 67.8 kg 70.1 kg 70.1 kg    Examination:  General exam: Appears calm and comfortable  Respiratory system: Decreased breath sounds at the bases Cardiovascular system: S1 & S2 heard, RRR.  Abd: nondistended, soft and nontender.Normal bowel sounds heard. Central nervous system: Alert and oriented. No  focal neurological deficits. Extremities: Trace edema Skin: No rashes Psychiatry:  Mood & affect appropriate.     Data Reviewed:   CBC: Recent Labs  Lab 11/21/23 0608 11/22/23 0725 11/23/23 0528 11/24/23 0610 11/25/23 0601   WBC 16.3* 10.6* 10.3 9.3 8.5  HGB 7.6* 7.1* 7.6* 7.6* 7.6*  HCT 24.8* 23.7* 24.6* 24.6* 24.7*  MCV 91.9 94.0 89.8 90.4 90.1  PLT 377 326 300 304 338   Basic Metabolic Panel: Recent Labs  Lab 11/21/23 0608 11/22/23 0725 11/23/23 0528 11/24/23 0610 11/25/23 0601  NA 140 139 138 135 140  K 3.5 3.6 4.2 4.4 4.1  CL 110 107 106 102 106  CO2 21* 23 21* 21* 23  GLUCOSE 116* 109* 112* 113* 97  BUN 13 20 32* 48* 54*  CREATININE 1.21* 1.92* 2.75* 2.79* 2.33*  CALCIUM  8.4* 8.2* 8.3* 8.5* 8.7*   GFR: Estimated Creatinine Clearance: 19.7 mL/min (A) (by C-G formula based on SCr of 2.33 mg/dL (H)). Liver Function Tests: Recent Labs  Lab 11/20/23 1533 11/21/23 0608  AST 16 14*  ALT 11 10  ALKPHOS 100 70  BILITOT 1.0 0.8  PROT 7.0 6.3*  ALBUMIN 3.0* 2.5*   No results for input(s): LIPASE, AMYLASE in the last 168 hours. No results for input(s): AMMONIA in the last 168 hours. Coagulation Profile: No results for input(s): INR, PROTIME in the last 168 hours. Cardiac Enzymes: No results for input(s): CKTOTAL, CKMB, CKMBINDEX, TROPONINI in the last 168 hours. BNP (last 3 results) Recent Labs    11/20/23 1103  PROBNP 1,267.0*   HbA1C: No results for input(s): HGBA1C in the last 72 hours. CBG: Recent Labs  Lab 11/24/23 1620 11/24/23 2053 11/25/23 0545 11/25/23 0841 11/25/23 1216  GLUCAP 166* 128* 90 106* 134*   Lipid Profile: No results for input(s): CHOL, HDL, LDLCALC, TRIG, CHOLHDL, LDLDIRECT in the last 72 hours. Thyroid  Function Tests: No results for input(s): TSH, T4TOTAL, FREET4, T3FREE, THYROIDAB in the last 72 hours. Anemia Panel: No results for input(s): VITAMINB12, FOLATE, FERRITIN, TIBC, IRON , RETICCTPCT in the last 72 hours.  Urine analysis:    Component Value Date/Time   COLORURINE AMBER (A) 11/23/2023 2335   APPEARANCEUR HAZY (A) 11/23/2023 2335   LABSPEC 1.017 11/23/2023 2335   PHURINE 5.0  11/23/2023 2335   GLUCOSEU NEGATIVE 11/23/2023 2335   HGBUR NEGATIVE 11/23/2023 2335   HGBUR large 10/28/2007 1123   BILIRUBINUR NEGATIVE 11/23/2023 2335   KETONESUR 5 (A) 11/23/2023 2335   PROTEINUR >=300 (A) 11/23/2023 2335   UROBILINOGEN 1.0 10/28/2007 1123   NITRITE NEGATIVE 11/23/2023 2335   LEUKOCYTESUR NEGATIVE 11/23/2023 2335   Sepsis Labs: @LABRCNTIP (procalcitonin:4,lacticidven:4)  )No results found for this or any previous visit (from the past 240 hours).   Radiology Studies: US  RENAL Result Date: 11/23/2023 CLINICAL DATA:  Acute kidney injury. EXAM: RENAL / URINARY TRACT ULTRASOUND COMPLETE COMPARISON:  None Available. FINDINGS: Right Kidney: Renal measurements: 10.9 x 3.1 x 4.8 cm = volume: 85.4 mL. Increased parenchymal echogenicity. A cysts are present in the upper pole measuring up to 1 cm. No mass or hydronephrosis visualized. Left Kidney: Renal measurements: 9.4 x 5.0 x 5.0 cm = volume: 123.5 mL. Examination is limited to due to obscuration due to bowel, gas, or mass in this region. A cyst is noted in the midpole measuring up to 0.2 cm. No mass or hydronephrosis visualized. Bladder: Bladder is not seen and may be nondistended. Other: None. IMPRESSION: 1. Increased renal echogenicity bilaterally with bilateral renal cysts, compatible with  medical renal disease. 2. Bladder is not seen. Electronically Signed   By: Leita Birmingham M.D.   On: 11/23/2023 14:41      Scheduled Meds:  atorvastatin   40 mg Oral Daily   buPROPion   150 mg Oral Daily   Chlorhexidine  Gluconate Cloth  6 each Topical Daily   enoxaparin  (LOVENOX ) injection  30 mg Subcutaneous Q24H   feeding supplement  237 mL Oral BID BM   hydrALAZINE   25 mg Oral TID   insulin  aspart  0-5 Units Subcutaneous QHS   insulin  aspart  0-6 Units Subcutaneous TID WC   labetalol   200 mg Oral BID   pantoprazole   40 mg Oral Daily   predniSONE   30 mg Oral Q breakfast   sodium chloride  flush  10-40 mL Intracatheter Q12H   sodium  chloride flush  3 mL Intravenous Q12H   Continuous Infusions:     LOS: 4 days    Time spent:    Sigurd Pac, MD [facility]   11/25/2023, 12:35 PM

## 2023-11-25 NOTE — Progress Notes (Signed)
 Mobility Specialist Progress Note;   11/25/23 1337  Mobility  Activity Ambulated independently  Level of Assistance Standby assist, set-up cues, supervision of patient - no hands on  Assistive Device None  Distance Ambulated (ft) 300 ft  Activity Response Tolerated well  Mobility Referral Yes  Mobility visit 1 Mobility  Mobility Specialist Start Time (ACUTE ONLY) 1337  Mobility Specialist Stop Time (ACUTE ONLY) 1343  Mobility Specialist Time Calculation (min) (ACUTE ONLY) 6 min   Pt agreeable to mobility. Required no physical assistance during ambulation, SV. Found on RA and ambulated on RA. SPO2 dropped to 86%, however able to quickly recover to 91%> w/ PLB. No c/o when asked. Pt returned to bed and left with all needs met, call bell in reach.   Lauraine Erm Mobility Specialist Please contact via SecureChat or [employer] [phone]

## 2023-11-26 DIAGNOSIS — I161 Hypertensive emergency: Secondary | ICD-10-CM | POA: Diagnosis not present

## 2023-11-26 LAB — BPAM RBC
Blood Product Expiration Date: 202508222359
Blood Product Expiration Date: 202509152359
ISSUE DATE / TIME: 202508152253
Unit Type and Rh: 5100
Unit Type and Rh: 7300

## 2023-11-26 LAB — CBC
HCT: 25.8 % — ABNORMAL LOW (ref 36.0–46.0)
Hemoglobin: 7.9 g/dL — ABNORMAL LOW (ref 12.0–15.0)
MCH: 27.6 pg (ref 26.0–34.0)
MCHC: 30.6 g/dL (ref 30.0–36.0)
MCV: 90.2 fL (ref 80.0–100.0)
Platelets: 381 K/uL (ref 150–400)
RBC: 2.86 MIL/uL — ABNORMAL LOW (ref 3.87–5.11)
RDW: 19 % — ABNORMAL HIGH (ref 11.5–15.5)
WBC: 9.5 K/uL (ref 4.0–10.5)
nRBC: 0 % (ref 0.0–0.2)

## 2023-11-26 LAB — BASIC METABOLIC PANEL WITH GFR
Anion gap: 11 (ref 5–15)
BUN: 57 mg/dL — ABNORMAL HIGH (ref 8–23)
CO2: 22 mmol/L (ref 22–32)
Calcium: 9 mg/dL (ref 8.9–10.3)
Chloride: 108 mmol/L (ref 98–111)
Creatinine, Ser: 1.83 mg/dL — ABNORMAL HIGH (ref 0.44–1.00)
GFR, Estimated: 28 mL/min — ABNORMAL LOW (ref >60)
Glucose, Bld: 89 mg/dL (ref 70–99)
Potassium: 4.1 mmol/L (ref 3.5–5.1)
Sodium: 141 mmol/L (ref 135–145)

## 2023-11-26 LAB — TYPE AND SCREEN
ABO/RH(D): B POS
Antibody Screen: POSITIVE
Donor AG Type: NEGATIVE
Donor AG Type: NEGATIVE
PT AG Type: NEGATIVE
Unit division: 0
Unit division: 0

## 2023-11-26 LAB — GLUCOSE, CAPILLARY: Glucose-Capillary: 93 mg/dL (ref 70–99)

## 2023-11-26 MED ORDER — AMLODIPINE BESYLATE 5 MG PO TABS
5.0000 mg | ORAL_TABLET | Freq: Every day | ORAL | Status: DC
  Administered 2023-11-26: 5 mg via ORAL
  Filled 2023-11-26: qty 1

## 2023-11-26 MED ORDER — HYDRALAZINE HCL 50 MG PO TABS
100.0000 mg | ORAL_TABLET | Freq: Three times a day (TID) | ORAL | Status: DC
  Administered 2023-11-26: 100 mg via ORAL
  Filled 2023-11-26: qty 2

## 2023-11-26 MED ORDER — FUROSEMIDE 40 MG PO TABS: 40.0000 mg | ORAL_TABLET | Freq: Every day | ORAL | 0 refills | Status: DC

## 2023-11-26 MED ORDER — LABETALOL HCL 200 MG PO TABS
400.0000 mg | ORAL_TABLET | Freq: Two times a day (BID) | ORAL | Status: DC
  Administered 2023-11-26: 400 mg via ORAL
  Filled 2023-11-26: qty 2

## 2023-11-26 MED ORDER — HEPARIN SOD (PORK) LOCK FLUSH 100 UNIT/ML IV SOLN
500.0000 [IU] | INTRAVENOUS | Status: AC | PRN
  Administered 2023-11-26: 500 [IU]

## 2023-11-26 NOTE — Discharge Summary (Signed)
 Physician Discharge Summary  [name] FMW:[id number] DOB: [birth year] DOA: 11/20/2023  PCP: [name], MD  Admit date: 11/20/2023 Discharge date: 11/26/2023  Time spent: 45 minutes  Recommendations for Outpatient Follow-up:  [facility] clinic on 8/22 Oncology for breast cancer care, has hilar and mediastinal adenopathy on recent imaging likely related to her malignancy   Discharge Diagnoses:  Principal Problem:   Hypertensive emergency Active Problems:   Acute diastolic CHF   Bilateral calf pain   Essential hypertension   Carotid stenosis   History of peripheral vascular disease   History of breast cancer   GAD (generalized anxiety disorder) Acute gout flare   Discharge Condition: Improved  Diet recommendation low-sodium, heart healthy  Filed Weights   11/24/23 0420 11/25/23 0551 11/26/23 0543  Weight: 70.1 kg 70.1 kg 70.7 kg    History of present illness:  78 y.o. female with medical history significant of breast cancer on chemotherapy since May 2025, CKD stage IIIa, chronic vertigo, hyperlipidemia, HFpEF 60 to 65%, essential hypertension, carotid stenosis, peripheral vascular disease, and prediabetes.  Currently undergoing chemotherapy for breast CA, last treatment was about 3 weeks ago.  Now presenting to the ED with dyspnea on exertion, orthopnea X 2 days and L ankle pain.  In the ER blood pressure 235/103, troponin 918, D-dimer 1.3, WBC 14, hemoglobin 9.1 platelets 409, creatinine 1.04, chest x-ray with prominent reticular opacities, mild fullness of the pulmonary vasculature, CTA chest with no PE, multiple lung nodules, small right pleural effusion, mediastinal and hilar adenopathy  -Admitted, started on diuretics - 8/15 transfused PRBC, prednisone  for gout - Complicated by AKI from contrast nephropathy, now improving  Hospital Course:   Hypertensive emergency Acute on chronic diastolic heart failure preservation -echo with EF 60-65%, grade 2DD, RV  normal -Suspect steroid use around chemo treatment likely contributed to worsening BP and volume overload - Improved with diuresis, then complicated by AKI, Lasix  and Aldactone  held - Now appears euvolemic, start Lasix  40 mg daily from tomorrow -Close follow-up in [facility] clinic later this week, can add further GDMT as kidney function continues to improve   AKI -Suspect contrast-induced, had CTA in the ER day of admission - Trended up to 2.7, baseline is normal, now improving, renal ultrasound negative for hydronephrosis Appreciate nephrology input   Acute on chronic anemia -Hemoglobin down to 7.1, multifactorial secondary to chronic disease, chemotherapy - Anemia panel with iron  deficiency as well -Transfused 1 unit PRBC, given IV iron  - Hemoglobin now stable in the 7.6 range   Left ankle pain and swelling, suspected gout flare -Uric acid and ESR elevated, treated with prednisone , now improved, likely triggered by chemotherapy   History of carotid artery artery stenosis Continue Lipitor   Recent diagnosis of breast cancer on chemotherapy since May 2025 New diagnosis of breast cancer and currently on chemotherapy.  Follows [facility] oncology.   Generalized anxiety disorder -Continue bupropion    Multiple lung nodules Mediastinal and hilar adenopathy -Needs oncology follow-up in background of breast cancer  Discharge Exam: Vitals:   11/26/23 0806 11/26/23 1003  BP: (!) 179/96 (!) 164/64  Pulse:    Resp: 17   Temp: 98.9 F (37.2 C)   SpO2: 95%    Gen: Awake, Alert, Oriented X 3,  HEENT: no JVD Lungs: Good air movement bilaterally, CTAB CVS: S1S2/RRR Abd: soft, Non tender, non distended, BS present Extremities: No edema Skin: no new rashes on exposed skin   Discharge Instructions   Discharge Instructions  Diet - low sodium heart healthy   Complete by: As directed    Increase activity slowly   Complete by: As directed       Allergies as of 11/26/2023        Reactions   Benazepril Hcl    REACTION: FACE SWELLING (ANGIOEDEMA); she can not take ARBS  !!! Because of a history of documented adverse serious drug reaction;Medi Alert bracelet  is recommended   Yellow Dyes (non-tartrazine)    12/14 hives  & facial swelling with Eleanore Puller Aid        Medication List     STOP taking these medications    prochlorperazine  10 MG tablet Commonly known as: COMPAZINE    spironolactone  25 MG tablet Commonly known as: ALDACTONE    traMADol  50 MG tablet Commonly known as: ULTRAM        TAKE these medications    acetaminophen  325 MG tablet Commonly known as: TYLENOL  Take 650 mg by mouth every 6 (six) hours as needed for moderate pain (pain score 4-6).   amLODipine  10 MG tablet Commonly known as: NORVASC  Take 1 tablet (10 mg total) by mouth daily. For high blood pressure   atorvastatin  40 MG tablet Commonly known as: LIPITOR Take 1 tablet (40 mg total) by mouth daily.   buPROPion  150 MG 24 hr tablet Commonly known as: WELLBUTRIN  XL TAKE 1 TABLET (150 MG TOTAL) BY MOUTH IN THE MORNING DAILY.   dexamethasone  4 MG tablet Commonly known as: DECADRON  Take 2 tabs by mouth 2 times daily starting day before chemo. Then take 2 tabs daily for 2 days starting day after chemo. Take with food.   EPINEPHrine  0.3 mg/0.3 mL Soaj injection Commonly known as: EPI-PEN Inject 0.3 mg into the muscle as needed for anaphylaxis.   furosemide  40 MG tablet Commonly known as: Lasix  Take 1 tablet (40 mg total) by mouth daily. Start taking on: November 27, 2023   hydrALAZINE  100 MG tablet Commonly known as: APRESOLINE  Take 1 tablet (100 mg total) by mouth 3 (three) times daily.   labetalol  200 MG tablet Commonly known as: NORMODYNE  Take 2 tablets (400 mg total) by mouth 2 (two) times daily.   lidocaine -prilocaine  cream Commonly known as: EMLA  Apply to affected area once   omeprazole  40 MG capsule Commonly known as: PRILOSEC TAKE 1 CAPSULE BY MOUTH EVERY  DAY What changed:  how much to take when to take this reasons to take this   ondansetron  8 MG tablet Commonly known as: Zofran  Take 1 tablet (8 mg total) by mouth every 8 (eight) hours as needed for nausea or vomiting. Start on the third day after chemotherapy.       Allergies  Allergen Reactions   Benazepril Hcl     REACTION: FACE SWELLING (ANGIOEDEMA); she can not take ARBS  !!! Because of a history of documented adverse serious drug reaction;Medi Alert bracelet  is recommended   Yellow Dyes (Non-Tartrazine)     12/14 hives  & facial swelling with Eleanore Puller Aid    Follow-up Information     [facility]. Go in 3 day(s).   Specialty: Cardiology Why: Hospital follow up 11/29/2023 @ 8:15 am PLEASE bring a current medication list to appointment  FREE valet parking, Entrance C, off [address] LOOK foir women and [facility] entrance Contact information: [address] [place]  [phone]  The results of significant diagnostics from this hospitalization (including imaging, microbiology, ancillary and laboratory) are listed below for reference.    Significant Diagnostic Studies: US  RENAL Result Date: 11/23/2023 CLINICAL DATA:  Acute kidney injury. EXAM: RENAL / URINARY TRACT ULTRASOUND COMPLETE COMPARISON:  None Available. FINDINGS: Right Kidney: Renal measurements: 10.9 x 3.1 x 4.8 cm = volume: 85.4 mL. Increased parenchymal echogenicity. A cysts are present in the upper pole measuring up to 1 cm. No mass or hydronephrosis visualized. Left Kidney: Renal measurements: 9.4 x 5.0 x 5.0 cm = volume: 123.5 mL. Examination is limited to due to obscuration due to bowel, gas, or mass in this region. A cyst is noted in the midpole measuring up to 0.2 cm. No mass or hydronephrosis visualized. Bladder: Bladder is not seen and may be nondistended. Other: None. IMPRESSION: 1. Increased  renal echogenicity bilaterally with bilateral renal cysts, compatible with medical renal disease. 2. Bladder is not seen. Electronically Signed   By: Leita Birmingham M.D.   On: 11/23/2023 14:41   VAS US  ABI WITH/WO TBI Result Date: 11/21/2023  LOWER EXTREMITY DOPPLER STUDY Patient Name:  [name]  Date of Exam:   11/21/2023 Medical Rec #: [id number]     Accession #:    [id number] Date of Birth: 1945/08/03     Patient Gender: F Patient Age:   48 years Exam Location:  [facility] Procedure:      VAS US  ABI WITH/WO TBI Referring Phys: MICAELA SPEAKER --------------------------------------------------------------------------------  Indications: Claudication. High Risk Factors: Hypertension, hyperlipidemia, current smoker. Other Factors: CHF, carotid stenosis, CKD.  Comparison Study: No previous exams Performing Technologist: Leigh Rom RVT/RDMS  Examination Guidelines: A complete evaluation includes at minimum, Doppler waveform signals and systolic blood pressure reading at the level of bilateral brachial, anterior tibial, and posterior tibial arteries, when vessel segments are accessible. Bilateral testing is considered an integral part of a complete examination. Photoelectric Plethysmograph (PPG) waveforms and toe systolic pressure readings are included as required and additional duplex testing as needed. Limited examinations for reoccurring indications may be performed as noted.  ABI Findings: +---------+------------------+-----+----------+--------+ Right    Rt Pressure (mmHg)IndexWaveform  Comment  +---------+------------------+-----+----------+--------+ Brachial 189                    triphasic          +---------+------------------+-----+----------+--------+ PTA      146               0.77 monophasic         +---------+------------------+-----+----------+--------+ DP       140               0.74 monophasic         +---------+------------------+-----+----------+--------+ Great  Toe103               0.54 Abnormal           +---------+------------------+-----+----------+--------+ +---------+------------------+-----+---------+--------------------------------+ Left     Lt Pressure (mmHg)IndexWaveform Comment                          +---------+------------------+-----+---------+--------------------------------+ Brachial                        triphasicHx on breast cancer - no  pressures                        +---------+------------------+-----+---------+--------------------------------+ PTA      153               0.81 biphasic                                  +---------+------------------+-----+---------+--------------------------------+ DP       159               0.84 biphasic                                  +---------+------------------+-----+---------+--------------------------------+ Great Toe116               0.61 Abnormal                                  +---------+------------------+-----+---------+--------------------------------+  Summary: Right: Resting right ankle-brachial index indicates moderate right lower extremity arterial disease. The right toe-brachial index is abnormal. Left: Resting left ankle-brachial index indicates mild left lower extremity arterial disease. The left toe-brachial index is abnormal. *See table(s) above for measurements and observations.  Electronically signed by Gaile New MD on 11/21/2023 at 7:14:03 PM.    Final    VAS US  LOWER EXTREMITY VENOUS (DVT) Result Date: 11/21/2023  Lower Venous DVT Study Patient Name:  [name]  Date of Exam:   11/21/2023 Medical Rec #: [id number]     Accession #:    [id number] Date of Birth: 01-Jul-1945     Patient Gender: F Patient Age:   51 years Exam Location:  [facility] Procedure:      VAS US  LOWER EXTREMITY VENOUS (DVT) Referring Phys: MICAELA SPEAKER  --------------------------------------------------------------------------------  Indications: Pain, and Swelling.  Risk Factors: Breast cancer, chemotherapy. Comparison Study: RLEV on 11/10/2008 was negative for DVT Performing Technologist: Ezzie Potters RVT, RDMS  Examination Guidelines: A complete evaluation includes B-mode imaging, spectral Doppler, color Doppler, and power Doppler as needed of all accessible portions of each vessel. Bilateral testing is considered an integral part of a complete examination. Limited examinations for reoccurring indications may be performed as noted. The reflux portion of the exam is performed with the patient in reverse Trendelenburg.  +---------+---------------+---------+-----------+----------+--------------+ RIGHT    CompressibilityPhasicitySpontaneityPropertiesThrombus Aging +---------+---------------+---------+-----------+----------+--------------+ CFV      Full           Yes      Yes                                 +---------+---------------+---------+-----------+----------+--------------+ SFJ      Full                                                        +---------+---------------+---------+-----------+----------+--------------+ FV Prox  Full           Yes      Yes                                 +---------+---------------+---------+-----------+----------+--------------+  FV Mid   Full           Yes      Yes                                 +---------+---------------+---------+-----------+----------+--------------+ FV DistalFull           Yes      Yes                                 +---------+---------------+---------+-----------+----------+--------------+ PFV      Full                                                        +---------+---------------+---------+-----------+----------+--------------+ POP      Full           Yes      Yes                                  +---------+---------------+---------+-----------+----------+--------------+ PTV      Full                                                        +---------+---------------+---------+-----------+----------+--------------+ PERO     Full                                                        +---------+---------------+---------+-----------+----------+--------------+   +---------+---------------+---------+-----------+----------+--------------+ LEFT     CompressibilityPhasicitySpontaneityPropertiesThrombus Aging +---------+---------------+---------+-----------+----------+--------------+ CFV      Full           Yes      Yes                                 +---------+---------------+---------+-----------+----------+--------------+ SFJ      Full                                                        +---------+---------------+---------+-----------+----------+--------------+ FV Prox  Full           Yes      Yes                                 +---------+---------------+---------+-----------+----------+--------------+ FV Mid   Full           Yes      Yes                                 +---------+---------------+---------+-----------+----------+--------------+ FV DistalFull  Yes      Yes                                 +---------+---------------+---------+-----------+----------+--------------+ PFV      Full                                                        +---------+---------------+---------+-----------+----------+--------------+ POP      Full           Yes      Yes                                 +---------+---------------+---------+-----------+----------+--------------+ PTV      Full                                                        +---------+---------------+---------+-----------+----------+--------------+ PERO     Full                                                         +---------+---------------+---------+-----------+----------+--------------+     Summary: BILATERAL: - No evidence of deep vein thrombosis seen in the lower extremities, bilaterally. -No evidence of popliteal cyst, bilaterally. - Ultrasound characteristics of enlarged lymph nodes are noted in the groin, bilaterally.   *See table(s) above for measurements and observations. Electronically signed by Gaile New MD on 11/21/2023 at 7:10:37 PM.    Final    ECHOCARDIOGRAM COMPLETE Result Date: 11/21/2023    ECHOCARDIOGRAM REPORT   Patient Name:   [name] Date of Exam: 11/21/2023 Medical Rec #:  [id number]    Height:       64.0 in Accession #:    [id number]   Weight:       154.1 lb Date of Birth:  [birth year]    BSA:          1.751 m Patient Age:    78 years     BP:           197/73 mmHg Patient Gender: F            HR:           65 bpm. Exam Location:  Inpatient Procedure: 2D Echo, Cardiac Doppler and Color Doppler (Both Spectral and Color            Flow Doppler were utilized during procedure). Indications:    CHF  History:        Patient has prior history of Echocardiogram examinations, most                 recent 12/18/2021. CHF; Risk Factors:Hypertension.  Sonographer:    Carmelita Hartshorn RDCS, FE, PE Referring Phys: [id number] SUBRINA SUNDIL IMPRESSIONS  1. Left ventricular ejection fraction, by estimation, is 60 to 65%. The left ventricle has normal function. The left ventricle has no regional wall motion abnormalities. Left ventricular diastolic  parameters are consistent with Grade II diastolic dysfunction (pseudonormalization).  2. Right ventricular systolic function is normal. The right ventricular size is normal. Tricuspid regurgitation signal is inadequate for assessing PA pressure.  3. Left atrial size was severely dilated.  4. Right atrial size was mildly dilated.  5. The mitral valve is grossly normal. Trivial mitral valve regurgitation. No evidence of mitral stenosis.  6. The aortic valve is tricuspid. Aortic  valve regurgitation is not visualized. No aortic stenosis is present.  7. The inferior vena cava is normal in size with greater than 50% respiratory variability, suggesting right atrial pressure of 3 mmHg. Comparison(s): No significant change from prior study. FINDINGS  Left Ventricle: Left ventricular ejection fraction, by estimation, is 60 to 65%. The left ventricle has normal function. The left ventricle has no regional wall motion abnormalities. The left ventricular internal cavity size was normal in size. There is  no left ventricular hypertrophy. Left ventricular diastolic parameters are consistent with Grade II diastolic dysfunction (pseudonormalization). Right Ventricle: The right ventricular size is normal. No increase in right ventricular wall thickness. Right ventricular systolic function is normal. Tricuspid regurgitation signal is inadequate for assessing PA pressure. Left Atrium: Left atrial size was severely dilated. Right Atrium: Right atrial size was mildly dilated. Pericardium: There is no evidence of pericardial effusion. Mitral Valve: The mitral valve is grossly normal. Trivial mitral valve regurgitation. No evidence of mitral valve stenosis. Tricuspid Valve: The tricuspid valve is grossly normal. Tricuspid valve regurgitation is trivial. No evidence of tricuspid stenosis. Aortic Valve: The aortic valve is tricuspid. Aortic valve regurgitation is not visualized. No aortic stenosis is present. Pulmonic Valve: The pulmonic valve was grossly normal. Pulmonic valve regurgitation is not visualized. No evidence of pulmonic stenosis. Aorta: The aortic root and ascending aorta are structurally normal, with no evidence of dilitation. Venous: The right lower pulmonary vein is normal. The inferior vena cava is normal in size with greater than 50% respiratory variability, suggesting right atrial pressure of 3 mmHg. IAS/Shunts: The atrial septum is grossly normal.  LEFT VENTRICLE PLAX 2D LVIDd:         5.00  cm   Diastology LVIDs:         3.40 cm   LV e' medial:    8.38 cm/s LV PW:         1.10 cm   LV E/e' medial:  22.6 LV IVS:        0.90 cm   LV e' lateral:   9.03 cm/s LVOT diam:     2.00 cm   LV E/e' lateral: 20.9 LV SV:         81 LV SV Index:   46 LVOT Area:     3.14 cm  RIGHT VENTRICLE RV Basal diam:  3.50 cm RV Mid diam:    3.10 cm RV S prime:     13.90 cm/s TAPSE (M-mode): 2.0 cm LEFT ATRIUM              Index        RIGHT ATRIUM           Index LA diam:        4.50 cm  2.57 cm/m   RA Area:     15.50 cm LA Vol (A2C):   95.9 ml  54.76 ml/m  RA Volume:   37.50 ml  21.41 ml/m LA Vol (A4C):   121.0 ml 69.10 ml/m LA Biplane Vol: 110.0 ml 62.81 ml/m  AORTIC VALVE LVOT Vmax:  125.00 cm/s LVOT Vmean:  85.200 cm/s LVOT VTI:    0.259 m  AORTA Ao Root diam: 3.00 cm Ao Asc diam:  2.50 cm MITRAL VALVE MV Area (PHT): 3.37 cm     SHUNTS MV Decel Time: 225 msec     Systemic VTI:  0.26 m MV E velocity: 189.00 cm/s  Systemic Diam: 2.00 cm MV A velocity: 130.00 cm/s MV E/A ratio:  1.45 Darryle Decent MD Electronically signed by Darryle Decent MD Signature Date/Time: 11/21/2023/2:13:39 PM    Final    DG Ankle 2 Views Right Result Date: 11/21/2023 CLINICAL DATA:  Right ankle pain EXAM: RIGHT ANKLE - 2 VIEW COMPARISON:  01/21/2009 FINDINGS: No acute fracture or dislocation is noted. No soft tissue changes are seen. Calcaneal spurring is noted. IMPRESSION: Degenerative change without acute abnormality. Electronically Signed   By: Oneil Devonshire M.D.   On: 11/21/2023 02:52   CT Angio Chest PE W and/or Wo Contrast Result Date: 11/21/2023 CLINICAL DATA:  Positive D-dimer and shortness of breath EXAM: CT ANGIOGRAPHY CHEST WITH CONTRAST TECHNIQUE: Multidetector CT imaging of the chest was performed using the standard protocol during bolus administration of intravenous contrast. Multiplanar CT image reconstructions and MIPs were obtained to evaluate the vascular anatomy. RADIATION DOSE REDUCTION: This exam was performed according  to the departmental dose-optimization program which includes automated exposure control, adjustment of the mA and/or kV according to patient size and/or use of iterative reconstruction technique. CONTRAST:  50mL OMNIPAQUE  IOHEXOL  350 MG/ML SOLN COMPARISON:  Chest x-ray from earlier in the same day. FINDINGS: Cardiovascular: Atherosclerotic calcifications of the thoracic aorta are noted. No aneurysmal dilatation is seen. Heart is mildly enlarged in size. The pulmonary artery shows a normal branching pattern without evidence of intraluminal filling defect to suggest pulmonary embolism. Right chest wall port is noted. Mediastinum/Nodes: Thoracic inlet is within normal limits. Scattered mediastinal nodes are seen the largest of which measures 17 mm in the right paratracheal region. Right hilar node is noted measuring up to 12 mm. The esophagus is within normal limits. Lungs/Pleura: Lungs are well aerated bilaterally. Emphysematous changes are noted with scarring in the left upper lobe. No focal infiltrate is seen. Tiny right-sided pleural effusion is noted. Tiny less than 5 mm nodules are noted scattered throughout the right lung these are most notable on images 29, 35 and 44 of series 6 on the right. Upper Abdomen: Visualized upper abdomen shows no acute abnormality. Musculoskeletal: No chest wall abnormality. No acute or significant osseous findings. Review of the MIP images confirms the above findings. IMPRESSION: No evidence of pulmonary emboli. Multiple pulmonary nodules. Most significant: Right solid pulmonary nodule measuring 4 mm. Per Fleischner Society Guidelines, no routine follow-up imaging is recommended. These guidelines do not apply to immunocompromised patients and patients with cancer. Follow up in patients with significant comorbidities as clinically warranted. For lung cancer screening, adhere to Lung-RADS guidelines. Reference: Radiology. 2017; 284(1):228-43. Small right pleural effusion. Scattered  mediastinal and hilar nodes likely reactive in nature. Short-term follow-up in 3-6 months may be helpful to assess for stability/resolution. Aortic Atherosclerosis (ICD10-I70.0) and Emphysema (ICD10-J43.9). Electronically Signed   By: Oneil Devonshire M.D.   On: 11/21/2023 00:19   DG Chest 2 View Result Date: 11/20/2023 EXAM: 2 VIEW(S) XRAY OF THE CHEST 11/20/2023 04:13:00 PM COMPARISON: PA and lateral radiographs of the chest dated 11/20/2023. CLINICAL HISTORY: SOB x 3-4 days ago, HTN, smoker. FINDINGS: LUNGS AND PLEURA: Mild fullness of the central pulmonary vasculature. Mildly prominent reticular opacities again  seen throughout the lungs, similar to the prior study. No pleural effusion. No pneumothorax. HEART AND MEDIASTINUM: The heart is normal in size. No acute abnormality of the cardiac and mediastinal silhouettes. BONES AND SOFT TISSUES: No acute osseous abnormality. The right internal jugular chest port remains in place. IMPRESSION: 1. Mildly prominent reticular opacities throughout the lungs, similar to the prior study. 2. Mild fullness of the central pulmonary vasculature. Electronically signed by: evalene coho 11/20/2023 06:18 PM EDT RP Workstation: [id number]   DG Chest 2 View Result Date: 11/20/2023 CLINICAL DATA:  DOE, acute. EXAM: CHEST - 2 VIEW COMPARISON:  05/04/2022. FINDINGS: Mild-to-moderate diffuse increased interstitial markings with bilateral hilar predominance. Bilateral lung fields are otherwise clear. No acute consolidation or lung collapse. There is subtle blunting of bilateral costophrenic angles, which may represent bilateral trace pleural effusions. Stable cardio-mediastinal silhouette. No acute osseous abnormalities. The soft tissues are within normal limits. A new right-sided CT Port-A-Cath is seen with its tip overlying the midportion of superior vena cava. IMPRESSION: *Findings favor congestive heart failure/pulmonary edema. Electronically Signed   By: Ree Molt M.D.    On: 11/20/2023 11:08    Microbiology: No results found for this or any previous visit (from the past 240 hours).   Labs: Basic Metabolic Panel: Recent Labs  Lab 11/22/23 0725 11/23/23 0528 11/24/23 0610 11/25/23 0601 11/26/23 0605  NA 139 138 135 140 141  K 3.6 4.2 4.4 4.1 4.1  CL 107 106 102 106 108  CO2 23 21* 21* 23 22  GLUCOSE 109* 112* 113* 97 89  BUN 20 32* 48* 54* 57*  CREATININE 1.92* 2.75* 2.79* 2.33* 1.83*  CALCIUM  8.2* 8.3* 8.5* 8.7* 9.0   Liver Function Tests: Recent Labs  Lab 11/20/23 1533 11/21/23 0608  AST 16 14*  ALT 11 10  ALKPHOS 100 70  BILITOT 1.0 0.8  PROT 7.0 6.3*  ALBUMIN 3.0* 2.5*   No results for input(s): LIPASE, AMYLASE in the last 168 hours. No results for input(s): AMMONIA in the last 168 hours. CBC: Recent Labs  Lab 11/22/23 0725 11/23/23 0528 11/24/23 0610 11/25/23 0601 11/26/23 0605  WBC 10.6* 10.3 9.3 8.5 9.5  HGB 7.1* 7.6* 7.6* 7.6* 7.9*  HCT 23.7* 24.6* 24.6* 24.7* 25.8*  MCV 94.0 89.8 90.4 90.1 90.2  PLT 326 300 304 338 381   Cardiac Enzymes: No results for input(s): CKTOTAL, CKMB, CKMBINDEX, TROPONINI in the last 168 hours. BNP: BNP (last 3 results) Recent Labs    11/20/23 1533  BNP 918.9*    ProBNP (last 3 results) Recent Labs    11/20/23 1103  PROBNP 1,267.0*    CBG: Recent Labs  Lab 11/25/23 0841 11/25/23 1216 11/25/23 1617 11/25/23 2047 11/26/23 0809  GLUCAP 106* 134* 183* 118* 93       Signed:  Sigurd Pac MD.  [facility] 11/26/2023, 11:45 AM

## 2023-11-26 NOTE — Progress Notes (Signed)
   11/25/23 2039  Assess: MEWS Score  Temp 98.5 F (36.9 C)  BP (!) 202/79  MAP (mmHg) 111  Resp 18  Assess: MEWS Score  MEWS Temp 0  MEWS Systolic 2  MEWS Pulse 0  MEWS RR 0  MEWS LOC 0  MEWS Score 2  MEWS Score Color Yellow  Assess: if the MEWS score is Yellow or Red  Were vital signs accurate and taken at a resting state? No, vital signs rechecked (BP checked after returning to bed from bathroom)  Does the patient meet 2 or more of the SIRS criteria? No  MEWS guidelines implemented  Yes, yellow  Treat  MEWS Interventions Considered administering scheduled or prn medications/treatments as ordered  Take Vital Signs  Increase Vital Sign Frequency  Yellow: Q2hr x1, continue Q4hrs until patient remains green for 12hrs  Escalate  MEWS: Escalate Yellow: Discuss with charge nurse and consider notifying provider and/or RRT  Notify: Charge Nurse/RN  Name of Charge Nurse/RN Notified [place], RN  Assess: SIRS CRITERIA  SIRS Temperature  0  SIRS Respirations  0  SIRS Pulse 0  SIRS WBC 0  SIRS Score Sum  0

## 2023-11-26 NOTE — Progress Notes (Signed)
 Patients's BP with HS meds was 181/59, scheduled hydralazine  and labetalol  given.  Patient asymptomatic.  Dr. Keturah notified with no additional orders.  BP now in the 156/51, Dr. Keturah updated with current BP.

## 2023-11-26 NOTE — Progress Notes (Signed)
 Nephrology Follow-Up Consult note   Assessment/Recommendations: [name] is a/an 78 y.o. female with a past medical history significant for breast cancer on chemotherapy, CKD stage IIIa, CHF, HTN, admitted for heart failure exacerbation complicated by AKI.       Nonoliguric AKI on CKD 3 A: Baseline creatinine 1.2-1.5.  Creatinine low on admission but went up to 2.8.  Possibly related to contrast and some cardiorenal syndrome. -Creatinine improving with supportive care -Continue to monitor daily Cr, Dose meds for GFR -Monitor Daily I/Os, Daily weight  -Maintain MAP>65 for optimal renal perfusion.  -Avoid nephrotoxic medications including NSAIDs -Use synthetic opioids (Fentanyl /Dilaudid) if needed -Currently no indication for HD  Diastolic heart failure exacerbation: With some signs of volume overload.  Received IV Lasix  with diuresis now off at this time.  Volume status much improved  Hypertension: Significant elevation on admission.  Blood pressure had improved but higher today.  Added back home amlodipine  5 mg daily.  Continue to uptitrate to home medications.  Consider thiazide if needed  Anemia: Multifactorial.  Transfusions per primary team.  Avoid ESA in the setting of cancer Breast cancer: Active disease on chemotherapy  Given the patient's improving creatinine we will sign off at this time   Recommendations conveyed to primary service.    [name] JINNY [facility] 11/26/2023 10:32 AM  ___________________________________________________________  CC: Shortness of breath  Interval History/Subjective: Patient feels well with no complaints   Medications:  Current Facility-Administered Medications  Medication Dose Route Frequency Provider Last Rate Last Admin   acetaminophen  (TYLENOL ) tablet 650 mg  650 mg Oral Q6H PRN Sundil, Subrina, MD   650 mg at 11/21/23 1836   Or   acetaminophen  (TYLENOL ) suppository 650 mg  650 mg Rectal Q6H PRN Sundil,  Subrina, MD       amLODipine  (NORVASC ) tablet 5 mg  5 mg Oral Daily Keilani Terrance J, MD   5 mg at 11/26/23 9261   atorvastatin  (LIPITOR) tablet 40 mg  40 mg Oral Daily Sundil, Subrina, MD   40 mg at 11/26/23 1003   buPROPion  (WELLBUTRIN  XL) 24 hr tablet 150 mg  150 mg Oral Daily Sundil, Subrina, MD   150 mg at 11/26/23 1003   Chlorhexidine  Gluconate Cloth 2 % PADS 6 each  6 each Topical Daily Sundil, Subrina, MD   6 each at 11/25/23 1000   enoxaparin  (LOVENOX ) injection 30 mg  30 mg Subcutaneous Q24H Joseph, Preetha, MD   30 mg at 11/26/23 1013   feeding supplement (ENSURE PLUS HIGH PROTEIN) liquid 237 mL  237 mL Oral BID BM Fairy Frames, MD   237 mL at 11/26/23 1016   hydrALAZINE  (APRESOLINE ) tablet 100 mg  100 mg Oral TID Fairy Frames, MD   100 mg at 11/26/23 1003   insulin  aspart (novoLOG ) injection 0-5 Units  0-5 Units Subcutaneous QHS Sundil, Subrina, MD       insulin  aspart (novoLOG ) injection 0-6 Units  0-6 Units Subcutaneous TID WC Sundil, Subrina, MD   1 Units at 11/25/23 1723   labetalol  (NORMODYNE ) tablet 400 mg  400 mg Oral BID Joseph, Preetha, MD   400 mg at 11/26/23 0739   morphine  (PF) 2 MG/ML injection 1 mg  1 mg Intravenous Q4H PRN Joseph, Preetha, MD       ondansetron  (ZOFRAN ) tablet 4 mg  4 mg Oral Q6H PRN Sundil, Subrina, MD       Or   ondansetron  (ZOFRAN ) injection 4 mg  4 mg Intravenous Q6H PRN  Sundil, Subrina, MD   4 mg at 11/21/23 2233   pantoprazole  (PROTONIX ) EC tablet 40 mg  40 mg Oral Daily Sundil, Subrina, MD   40 mg at 11/26/23 1003   senna (SENOKOT) tablet 17.2 mg  2 tablet Oral Daily PRN Opyd, Timothy S, MD   17.2 mg at 11/25/23 1727   sodium chloride  flush (NS) 0.9 % injection 10-40 mL  10-40 mL Intracatheter Q12H Sundil, Subrina, MD   10 mL at 11/26/23 1016   sodium chloride  flush (NS) 0.9 % injection 10-40 mL  10-40 mL Intracatheter PRN Sundil, Subrina, MD       sodium chloride  flush (NS) 0.9 % injection 3 mL  3 mL Intravenous Q12H Sundil, Subrina, MD   3  mL at 11/26/23 1017   sodium chloride  flush (NS) 0.9 % injection 3 mL  3 mL Intravenous PRN Sundil, Subrina, MD          Review of Systems: 10 systems reviewed and negative except per interval history/subjective  Physical Exam: Vitals:   11/26/23 0806 11/26/23 1003  BP: (!) 179/96 (!) 164/64  Pulse:    Resp: 17   Temp: 98.9 F (37.2 C)   SpO2: 95%    No intake/output data recorded.  Intake/Output Summary (Last 24 hours) at 11/26/2023 1032 Last data filed at 11/26/2023 0200 Gross per 24 hour  Intake 487 ml  Output 250 ml  Net 237 ml   Constitutional: well-appearing, no acute distress ENMT: ears and nose without scars or lesions, MMM CV: normal rate, no edema Respiratory: bilateral chest rise, normal work of breathing Gastrointestinal: soft, non-tender, no palpable masses or hernias Skin: no visible lesions or rashes Psych: alert, judgement/insight appropriate, appropriate mood and affect   Test Results I personally reviewed new and old clinical labs and radiology tests Lab Results  Component Value Date   NA 141 11/26/2023   K 4.1 11/26/2023   CL 108 11/26/2023   CO2 22 11/26/2023   BUN 57 (H) 11/26/2023   CREATININE 1.83 (H) 11/26/2023   GFR 58.96 (L) 11/20/2023   CALCIUM  9.0 11/26/2023   ALBUMIN 2.5 (L) 11/21/2023    CBC Recent Labs  Lab 11/24/23 0610 11/25/23 0601 11/26/23 0605  WBC 9.3 8.5 9.5  HGB 7.6* 7.6* 7.9*  HCT 24.6* 24.7* 25.8*  MCV 90.4 90.1 90.2  PLT 304 338 381

## 2023-11-26 NOTE — Progress Notes (Signed)
 Patients' BP this am 198/64.  Dr. Renold notified and orders to give hydralazine  now.  Medication given.  Will continue to monitor patient closely.

## 2023-11-26 NOTE — Care Management Important Message (Signed)
 Important Message  Patient Details  Name: [name] MRN: [id number] Date of Birth: 1945/04/14   Important Message Given:  Yes - Medicare IM     Vonzell Arrie Sharps 11/26/2023, 11:04 AM

## 2023-11-26 NOTE — TOC Transition Note (Signed)
 Transition of Care (TOC) - Discharge Note Rayfield Gobble RN, BSN Inpatient Care Management Unit 4E- RN Case Manager See Treatment Team for direct phone # Cross Coverage for 6E  Patient Details  Name: [name] MRN: [id number] Date of Birth: 1945/08/13  Transition of Care [facility]) CM/SW Contact:  Gobble Rayfield Hurst, RN Phone Number: 11/26/2023, 12:03 PM   Clinical Narrative:    Pt stable for transition home today, no HH or DME needs noted.  Pt has transportation per previous note.   No CM intervention needs noted.    Final next level of care: Home/Self Care Barriers to Discharge: Barriers Resolved   Patient Goals and CMS Choice Patient states their goals for this hospitalization and ongoing recovery are:: to return home   Choice offered to / list presented to : NA      Discharge Placement               Home        Discharge Plan and Services Additional resources added to the After Visit Summary for   In-house Referral: Clinical Social Work Discharge Planning Services: NA Post Acute Care Choice: NA          DME Arranged: N/A DME Agency: NA       HH Arranged: NA HH Agency: NA        Social Drivers of Health (SDOH) Interventions SDOH Screenings   Food Insecurity: No Food Insecurity (11/22/2023)  Housing: Low Risk  (11/22/2023)  Recent Concern: Housing - High Risk (08/28/2023)  Transportation Needs: No Transportation Needs (11/25/2023)  Utilities: Not At Risk (11/22/2023)  Recent Concern: Utilities - At Risk (08/28/2023)  Alcohol Screen: Low Risk  (11/25/2023)  Depression (PHQ2-9): Low Risk  (10/10/2023)  Financial Resource Strain: Low Risk  (11/25/2023)  Physical Activity: Sufficiently Active (05/31/2023)  Social Connections: Socially Isolated (11/22/2023)  Stress: No Stress Concern Present (05/31/2023)  Tobacco Use: High Risk (11/21/2023)  Health Literacy: Adequate Health Literacy (05/31/2023)     Readmission Risk Interventions    11/26/2023   12:03 PM   Readmission Risk Prevention Plan  Transportation Screening Complete  HRI or Home Care Consult Complete  Social Work Consult for Recovery Care Planning/Counseling Complete  Palliative Care Screening Not Applicable  Medication Review Ocean[employer]) Complete

## 2023-11-27 ENCOUNTER — Telehealth: Payer: Self-pay | Admitting: *Deleted

## 2023-11-27 ENCOUNTER — Telehealth: Payer: Self-pay | Admitting: Hematology and Oncology

## 2023-11-27 NOTE — Telephone Encounter (Signed)
 called pt to schedule f/u appt no answer no voice mail set up

## 2023-11-27 NOTE — Transitions of Care (Post Inpatient/ED Visit) (Signed)
 11/27/2023  Name: [name] MRN: [id number] DOB: 12/18/1945  Today's TOC FU Call Status: Today's TOC FU Call Status:: Successful TOC FU Call Completed TOC FU Call Complete Date: 11/27/23 Patient's Name and Date of Birth confirmed.  Transition Care Management Follow-up Telephone Call Date of Discharge: 11/26/23 Discharge Facility: [facility]) Type of Discharge: Inpatient Admission Primary Inpatient Discharge Diagnosis:: HTN emergency How have you been since you were released from the hospital?: Better (I am feeling great and doing well.  Much better.  Weighing myself every day like they told me to and just got back from the pharmacy- picked up all my meds and got a blood pressure cuff.  Looking forward to going back to work soon) Any questions or concerns?: No  Items Reviewed: Did you receive and understand the discharge instructions provided?: Yes (thoroughly reviewed with patient who verbalizes good understanding of same) Medications obtained,verified, and reconciled?: Yes (Medications Reviewed) (Full medication reconciliation/ review completed; no concerns or discrepancies identified; confirmed patient obtained/ is taking all newly Rx'd medications as instructed; self-manages medications and denies questions/ concerns around medications today) Any new allergies since your discharge?: No Dietary orders reviewed?: Yes Type of Diet Ordered:: Low sodium Heart Healthy Do you have support at home?: Yes People in Home [RPT]: sibling(s) (resides with brother; reports her local sister is her primary support- sister lives right around the corner from me) Name of Support/Comfort Primary Source: Reports independent in self-care activities; supportive sister assists as/ if needed/ indicated-- resides with brother; but reports her local sister is her primary support- sister lives right around the corner from me  Medications Reviewed Today: Medications Reviewed Today     Reviewed by  Shontia Gillooly M, RN (Registered Nurse) on 11/27/23 at 1203  Med List Status: <None>   Medication Order Taking? Sig Documenting Provider Last Dose Status Informant  acetaminophen  (TYLENOL ) 325 MG tablet [id number] Yes Take 650 mg by mouth every 6 (six) hours as needed for moderate pain (pain score 4-6). [provider]  Active Self, Pharmacy Records  amLODipine  (NORVASC ) 10 MG tablet [id number] Yes Take 1 tablet (10 mg total) by mouth daily. For high blood pressure Burns, Glade PARAS, MD  Active Self, Pharmacy Records           Med Note [facility], DONETA GORMAN Schaumann Nov 21, 2023 12:30 AM) Ran out of it and just picked up the refill on Wed  atorvastatin  (LIPITOR) 40 MG tablet [id number] Yes Take 1 tablet (40 mg total) by mouth daily. [name], MD  Active Self, Pharmacy Records  buPROPion  (WELLBUTRIN  XL) 150 MG 24 hr tablet [id number] Yes TAKE 1 TABLET (150 MG TOTAL) BY MOUTH IN THE MORNING DAILY. [name], MD  Active Self, Pharmacy Records           Med Note [facility], DONETA GORMAN Schaumann Nov 21, 2023 12:31 AM) Just picked up refill on Wed  dexamethasone  (DECADRON ) 4 MG tablet [id number] Yes Take 2 tabs by mouth 2 times daily starting day before chemo. Then take 2 tabs daily for 2 days starting day after chemo. Take with food. Iruku, Praveena, MD  Active Self, Pharmacy Records           Med Note [facility], DONETA GORMAN   Thu Nov 21, 2023 12:32 AM)    EPINEPHrine  0.3 mg/0.3 mL IJ SOAJ injection [id number] Yes Inject 0.3 mg into the muscle as needed for anaphylaxis. Iruku, Praveena, MD  Active Self, Pharmacy Records  furosemide  (LASIX ) 40 MG tablet [id number] Yes Take 1 tablet (40 mg total) by mouth daily. Fairy Frames, MD  Active   hydrALAZINE  (APRESOLINE ) 100 MG tablet [id number] Yes Take 1 tablet (100 mg total) by mouth 3 (three) times daily. [name], MD  Active Self, Pharmacy Records  labetalol  (NORMODYNE ) 200 MG tablet [id number] Yes Take 2 tablets (400 mg total) by mouth 2 (two) times daily. [name], MD  Active Self, Pharmacy Records  lidocaine -prilocaine  (EMLA ) cream [id number] Yes Apply to affected area once Loretha Ash, MD  Active Self, Pharmacy Records  omeprazole  (PRILOSEC) 40 MG capsule [id number] Yes TAKE 1 CAPSULE BY MOUTH EVERY DAY Burns, Glade PARAS, MD  Active Self, Pharmacy Records  ondansetron  (ZOFRAN ) 8 MG tablet [id number] Yes Take 1 tablet (8 mg total) by mouth every 8 (eight) hours as needed for nausea or vomiting. Start on the third day after chemotherapy. Loretha Ash, MD  Active Self, Pharmacy Records           Med Note (WHITE, DONETA RAMAN   Thu Nov 21, 2023 12:37 AM)             Home Care and Equipment/Supplies: Were Home Health Services Ordered?: No Any new equipment or medical supplies ordered?: No  Functional Questionnaire: Do you need assistance with bathing/showering or dressing?: No Do you need assistance with meal preparation?: No Do you need assistance with eating?: No Do you have difficulty maintaining continence: No Do you need assistance with getting out of bed/getting out of a chair/moving?: No Do you have difficulty managing or taking your medications?: No  Follow up appointments reviewed: PCP Follow-up appointment confirmed?: Yes (care coordination outreach in real-time with scheduling care guide to successfully facilitate switching routine PCP office visit to hospital follow up PCP visit- as was scheduled for 12/04/23) Date of PCP follow-up appointment?: 12/04/23 Follow-up Provider: PCP- Dr. Geofm Specialist [facility] Follow-up appointment confirmed?: Yes Date of Specialist follow-up appointment?: 11/29/23 Follow-Up Specialty Provider:: CHF clinic provider Do you need transportation to your follow-up appointment?: No Do you understand care options if your condition(s) worsen?: Yes-patient verbalized understanding  SDOH Interventions Today    Flowsheet Row Most Recent Value  SDOH Interventions   Food Insecurity Interventions Intervention Not  Indicated  Housing Interventions Intervention Not Indicated  Transportation Interventions Intervention Not Indicated  [drives self at baseline,  sister assists as/ if indicated- needed]  Utilities Interventions Intervention Not Indicated   See TOC assessment tabs for additional assessment/ TOC intervention information  Patient declines need for ongoing/ further care management/ coordination outreach; declines enrollment in 30-day TOC program- declines taking my direct phone number should needs/ concerns arise post-TOC call   Pls call/ message for questions,  Janesia Joswick Mckinney Aarion Metzgar, RN, BSN, CCRN Alumnus RN Care Manager  Transitions of Care  [facility] [phone]: direct office

## 2023-11-28 ENCOUNTER — Telehealth (HOSPITAL_COMMUNITY): Payer: Self-pay

## 2023-11-28 ENCOUNTER — Encounter: Payer: Self-pay | Admitting: *Deleted

## 2023-11-28 NOTE — Progress Notes (Incomplete)
 HEART & VASCULAR TRANSITION OF CARE CONSULT NOTE     Referring Physician: [name], MD   Chief Complaint:   HPI: Referred to clinic by *** for heart failure consultation.   [name] is a 78 y.o. female with history of breast cancer on chemo since 05/25, CKD IIIa, carotid artery stenosis, PAD, vertigo, chronic diastolic CHF.   She was admitted in 08/25 with acute on chronic CHF in setting of hypertensive emergency. It was suspected chemotherapy and steroid use were contributing. Echo with EF 60-65%, grade II DD, RV okay. Course c/b AKI. Nephrology consulted and suspected contrast-induced nephropathy. Ahe also had acute on chronic anemia requiring transfusion and IV iron .  She is here today for post hospital CHF follow-up.   Cardiac Testing    Past Medical History:  Diagnosis Date   Absence of menstruation    amenorrhea   Angioneurotic edema not elsewhere classified    due to ACE-I   Breast cancer (HCC)    Breast disorder    breast cancer 2007   Cancer [facility]) 2007   Left Breast Cancer   Carotid stenosis    Carotid US  (8/15):  RICA 40-59%; LICA 40-59% >>> FU 1 year   Chronic kidney disease    CKD3   GERD (gastroesophageal reflux disease)    H/O: rheumatic fever    History of breast cancer 07/13/2015   HLD (hyperlipidemia)    Occlusion and stenosis of carotid artery without mention of cerebral infarction    Other abnormal glucose    fasting hyperglycemia   Peripheral vascular disease (HCC)    Carotid Stenosis   Personal history of malignant neoplasm of breast    Personal history of radiation therapy 2007   Pneumonia    Pre-diabetes    Unspecified essential hypertension     Current Outpatient Medications  Medication Sig Dispense Refill   acetaminophen  (TYLENOL ) 325 MG tablet Take 650 mg by mouth every 6 (six) hours as needed for moderate pain (pain score 4-6).     amLODipine  (NORVASC ) 10 MG tablet Take 1 tablet (10 mg total) by mouth daily. For high  blood pressure 90 tablet 2   atorvastatin  (LIPITOR) 40 MG tablet Take 1 tablet (40 mg total) by mouth daily. 90 tablet 1   buPROPion  (WELLBUTRIN  XL) 150 MG 24 hr tablet TAKE 1 TABLET (150 MG TOTAL) BY MOUTH IN THE MORNING DAILY. 90 tablet 2   dexamethasone  (DECADRON ) 4 MG tablet Take 2 tabs by mouth 2 times daily starting day before chemo. Then take 2 tabs daily for 2 days starting day after chemo. Take with food. 30 tablet 1   EPINEPHrine  0.3 mg/0.3 mL IJ SOAJ injection Inject 0.3 mg into the muscle as needed for anaphylaxis. 1 each 1   furosemide  (LASIX ) 40 MG tablet Take 1 tablet (40 mg total) by mouth daily. 30 tablet 0   hydrALAZINE  (APRESOLINE ) 100 MG tablet Take 1 tablet (100 mg total) by mouth 3 (three) times daily. 90 tablet 5   labetalol  (NORMODYNE ) 200 MG tablet Take 2 tablets (400 mg total) by mouth 2 (two) times daily. 120 tablet 5   lidocaine -prilocaine  (EMLA ) cream Apply to affected area once 30 g 3   omeprazole  (PRILOSEC) 40 MG capsule TAKE 1 CAPSULE BY MOUTH EVERY DAY 90 capsule 0   ondansetron  (ZOFRAN ) 8 MG tablet Take 1 tablet (8 mg total) by mouth every 8 (eight) hours as needed for nausea or vomiting. Start on the third day after  chemotherapy. 30 tablet 1   No current facility-administered medications for this visit.    Allergies  Allergen Reactions   Benazepril Hcl     REACTION: FACE SWELLING (ANGIOEDEMA); she can not take ARBS  !!! Because of a history of documented adverse serious drug reaction;Medi Alert bracelet  is recommended   Yellow Dyes (Non-Tartrazine)     12/14 hives  & facial swelling with Eleanore Puller Aid      Social History   Socioeconomic History   Marital status: Single    Spouse name: Not on file   Number of children: 0   Years of education: Not on file   Highest education level: High school graduate  Occupational History   Not on file  Tobacco Use   Smoking status: Every Day    Current packs/day: 0.50    Average packs/day: 0.5 packs/day for  48.0 years (24.0 ttl pk-yrs)    Types: Cigarettes    Passive exposure: Current   Smokeless tobacco: Never   Tobacco comments:    Smokes daily.    Vaping Use   Vaping status: Never Used  Substance and Sexual Activity   Alcohol use: Yes    Alcohol/week: 1.0 standard drink of alcohol    Types: 1 Standard drinks or equivalent per week    Comment: occasionally   Drug use: No   Sexual activity: Yes    Birth control/protection: Post-menopausal  Other Topics Concern   Not on file  Social History Narrative   Single, no children.   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    Social Drivers of Corporate investment banker Strain: Low Risk  (11/25/2023)   Overall Financial Resource Strain (CARDIA)    Difficulty of Paying Living Expenses: Not very hard  Food Insecurity: No Food Insecurity (11/27/2023)   Hunger Vital Sign    Worried About Running Out of Food in the Last Year: Never true    Ran Out of Food in the Last Year: Never true  Transportation Needs: No Transportation Needs (11/27/2023)   PRAPARE - Administrator, Civil Service (Medical): No    Lack of Transportation (Non-Medical): No  Physical Activity: Sufficiently Active (05/31/2023)   Exercise Vital Sign    Days of Exercise per Week: 5 days    Minutes of Exercise per Session: 60 min  Stress: No Stress Concern Present (05/31/2023)   [employer] of Occupational Health - Occupational Stress Questionnaire    Feeling of Stress : Not at all  Social Connections: Socially Isolated (11/22/2023)   Social Connection and Isolation Panel    Frequency of Communication with Friends and Family: Twice a week    Frequency of Social Gatherings with Friends and Family: Once a week    Attends Religious Services: Never    Database administrator or Organizations: No    Attends Banker Meetings: Never    Marital Status: Never married  Intimate Partner Violence: Not At Risk (11/27/2023)   Humiliation, Afraid, Rape, and  Kick questionnaire    Fear of Current or Ex-Partner: No    Emotionally Abused: No    Physically Abused: No    Sexually Abused: No      Family History  Problem Relation Age of Onset   Breast cancer Sister 38   Hypertension Other        some of Pts brothers and sisters   Heart attack Neg Hx    Stroke Neg Hx  Diabetes Neg Hx    Heart disease Neg Hx     There were no vitals filed for this visit.  PHYSICAL EXAM: General:  Well appearing. No respiratory difficulty HEENT: normal Neck: supple. no JVD. Carotids 2+ bilat; no bruits. No lymphadenopathy or thryomegaly appreciated. Cor: PMI nondisplaced. Regular rate & rhythm. No rubs, gallops or murmurs. Lungs: clear Abdomen: soft, nontender, nondistended. No hepatosplenomegaly. No bruits or masses. Good bowel sounds. Extremities: no cyanosis, clubbing, rash, edema Neuro: alert & oriented x 3, cranial nerves grossly intact. moves all 4 extremities w/o difficulty. Affect pleasant.  ECG:   ASSESSMENT & PLAN:  HFpEF    Referred to HFSW (PCP, Medications, Transportation, ETOH Abuse, Drug Abuse, Insurance, Financial ): Yes or No Refer to Pharmacy: Yes or No Refer to Home Health: Yes on No Refer to [facility]: Yes or no  Refer to General Cardiology: Yes or No  Follow up

## 2023-11-28 NOTE — Telephone Encounter (Signed)
 Called to confirm/remind patient of their appointment at the [facility] on 11/29/23 8:15.   Appointment:   [x] Confirmed  [] Left mess   [] No answer/No voice mail  [] VM Full/unable to leave message  [] Phone not in service  Patient reminded to bring all medications and/or complete list.  Confirmed patient has transportation. Gave directions, instructed to utilize valet parking.

## 2023-11-29 ENCOUNTER — Encounter (HOSPITAL_COMMUNITY): Payer: Self-pay

## 2023-11-29 ENCOUNTER — Ambulatory Visit (HOSPITAL_COMMUNITY)
Admit: 2023-11-29 | Discharge: 2023-11-29 | Disposition: A | Discharge status: Home / Self Care | Source: Ambulatory Visit | Attending: Physician Assistant | Admitting: Physician Assistant

## 2023-11-29 VITALS — BP 170/70 | HR 55 | Wt 156.8 lb

## 2023-11-29 DIAGNOSIS — N1831 Chronic kidney disease, stage 3a: Secondary | ICD-10-CM | POA: Insufficient documentation

## 2023-11-29 DIAGNOSIS — Z79899 Other long term (current) drug therapy: Secondary | ICD-10-CM | POA: Insufficient documentation

## 2023-11-29 DIAGNOSIS — F17210 Nicotine dependence, cigarettes, uncomplicated: Secondary | ICD-10-CM | POA: Diagnosis not present

## 2023-11-29 DIAGNOSIS — R42 Dizziness and giddiness: Secondary | ICD-10-CM | POA: Insufficient documentation

## 2023-11-29 DIAGNOSIS — I739 Peripheral vascular disease, unspecified: Secondary | ICD-10-CM | POA: Insufficient documentation

## 2023-11-29 DIAGNOSIS — I6523 Occlusion and stenosis of bilateral carotid arteries: Secondary | ICD-10-CM | POA: Insufficient documentation

## 2023-11-29 DIAGNOSIS — Z7969 Long term (current) use of other immunomodulators and immunosuppressants: Secondary | ICD-10-CM | POA: Diagnosis not present

## 2023-11-29 DIAGNOSIS — I10 Essential (primary) hypertension: Secondary | ICD-10-CM

## 2023-11-29 DIAGNOSIS — I130 Hypertensive heart and chronic kidney disease with heart failure and stage 1 through stage 4 chronic kidney disease, or unspecified chronic kidney disease: Secondary | ICD-10-CM | POA: Diagnosis not present

## 2023-11-29 DIAGNOSIS — C50912 Malignant neoplasm of unspecified site of left female breast: Secondary | ICD-10-CM | POA: Insufficient documentation

## 2023-11-29 DIAGNOSIS — I5032 Chronic diastolic (congestive) heart failure: Secondary | ICD-10-CM | POA: Diagnosis not present

## 2023-11-29 NOTE — Addendum Note (Signed)
 Encounter addended by: Colletta Manuelita Garre, PA-C on: 11/29/2023 9:46 AM  Actions taken: Clinical Note Signed

## 2023-11-29 NOTE — Addendum Note (Signed)
 Encounter addended by: Colletta Manuelita Garre, PA-C on: 11/29/2023 3:51 PM  Actions taken: Clinical Note Signed

## 2023-11-29 NOTE — Patient Instructions (Signed)
 Thank you for your visit today.  PLEASE MAKE SURE TO GET YOUR BLOOD WORK DONE AT THE CANCER CENTER ON MONDAY.  YOU HAVE BEEN REFERRED TO THE HYPERTENSION CLINIC. THEY WILL CALL YOU TO ARRANGE YOUR APPOINTMENT.  Thank you for allowing us  to provider your heart failure care after your recent hospitalization. Please follow-up with Hypertension Clinic as scheduled.  If you have any questions, issues, or concerns before your next appointment please call our office at [phone], opt. [phone] and leave a message for the triage nurse.

## 2023-12-03 ENCOUNTER — Inpatient Hospital Stay: Attending: Hematology and Oncology | Admitting: Hematology and Oncology

## 2023-12-03 ENCOUNTER — Inpatient Hospital Stay: Attending: Hematology and Oncology

## 2023-12-03 ENCOUNTER — Telehealth (HOSPITAL_COMMUNITY): Payer: Self-pay

## 2023-12-03 VITALS — BP 195/55 | HR 57 | Temp 97.8°F | Resp 18 | Wt 158.1 lb

## 2023-12-03 DIAGNOSIS — C50312 Malignant neoplasm of lower-inner quadrant of left female breast: Secondary | ICD-10-CM

## 2023-12-03 DIAGNOSIS — I130 Hypertensive heart and chronic kidney disease with heart failure and stage 1 through stage 4 chronic kidney disease, or unspecified chronic kidney disease: Secondary | ICD-10-CM | POA: Insufficient documentation

## 2023-12-03 DIAGNOSIS — I5032 Chronic diastolic (congestive) heart failure: Secondary | ICD-10-CM | POA: Diagnosis not present

## 2023-12-03 DIAGNOSIS — Z8739 Personal history of other diseases of the musculoskeletal system and connective tissue: Secondary | ICD-10-CM | POA: Insufficient documentation

## 2023-12-03 DIAGNOSIS — Z17421 Hormone receptor negative with human epidermal growth factor receptor 2 negative status: Secondary | ICD-10-CM | POA: Diagnosis not present

## 2023-12-03 DIAGNOSIS — Z171 Estrogen receptor negative status [ER-]: Secondary | ICD-10-CM

## 2023-12-03 DIAGNOSIS — N183 Chronic kidney disease, stage 3 unspecified: Secondary | ICD-10-CM | POA: Insufficient documentation

## 2023-12-03 DIAGNOSIS — Z95828 Presence of other vascular implants and grafts: Secondary | ICD-10-CM

## 2023-12-03 DIAGNOSIS — Z803 Family history of malignant neoplasm of breast: Secondary | ICD-10-CM

## 2023-12-03 LAB — CMP (CANCER CENTER ONLY)
ALT: 14 U/L (ref 0–44)
AST: 17 U/L (ref 15–41)
Albumin: 3.6 g/dL (ref 3.5–5.0)
Alkaline Phosphatase: 64 U/L (ref 38–126)
Anion gap: 8 (ref 5–15)
BUN: 29 mg/dL — ABNORMAL HIGH (ref 8–23)
CO2: 26 mmol/L (ref 22–32)
Calcium: 9 mg/dL (ref 8.9–10.3)
Chloride: 106 mmol/L (ref 98–111)
Creatinine: 1.48 mg/dL — ABNORMAL HIGH (ref 0.44–1.00)
GFR, Estimated: 36 mL/min — ABNORMAL LOW (ref >60)
Glucose, Bld: 95 mg/dL (ref 70–99)
Potassium: 4.2 mmol/L (ref 3.5–5.1)
Sodium: 140 mmol/L (ref 135–145)
Total Bilirubin: 0.4 mg/dL (ref 0.0–1.2)
Total Protein: 6.8 g/dL (ref 6.5–8.1)

## 2023-12-03 LAB — CBC WITH DIFFERENTIAL (CANCER CENTER ONLY)
Abs Immature Granulocytes: 0.03 K/uL (ref 0.00–0.07)
Basophils Absolute: 0 K/uL (ref 0.0–0.1)
Basophils Relative: 0 %
Eosinophils Absolute: 0.3 K/uL (ref 0.0–0.5)
Eosinophils Relative: 3 %
HCT: 30.5 % — ABNORMAL LOW (ref 36.0–46.0)
Hemoglobin: 9.5 g/dL — ABNORMAL LOW (ref 12.0–15.0)
Immature Granulocytes: 0 %
Lymphocytes Relative: 20 %
Lymphs Abs: 1.8 K/uL (ref 0.7–4.0)
MCH: 28.4 pg (ref 26.0–34.0)
MCHC: 31.1 g/dL (ref 30.0–36.0)
MCV: 91 fL (ref 80.0–100.0)
Monocytes Absolute: 0.5 K/uL (ref 0.1–1.0)
Monocytes Relative: 6 %
Neutro Abs: 6.1 K/uL (ref 1.7–7.7)
Neutrophils Relative %: 71 %
Platelet Count: 344 K/uL (ref 150–400)
RBC: 3.35 MIL/uL — ABNORMAL LOW (ref 3.87–5.11)
RDW: 19.5 % — ABNORMAL HIGH (ref 11.5–15.5)
WBC Count: 8.7 K/uL (ref 4.0–10.5)
nRBC: 0 % (ref 0.0–0.2)

## 2023-12-03 MED ORDER — CHLORTHALIDONE 25 MG PO TABS: 25.0000 mg | ORAL_TABLET | Freq: Every day | ORAL | 3 refills | Status: AC

## 2023-12-03 MED ORDER — SODIUM CHLORIDE 0.9% FLUSH
10.0000 mL | Freq: Once | INTRAVENOUS | Status: AC
  Administered 2023-12-03: 10 mL

## 2023-12-03 NOTE — Progress Notes (Deleted)
 Subjective:    Patient ID: [name], female    DOB: Apr 19, 1945, 78 y.o.   MRN: [id number]     HPI [name] is here for follow up from the hospital.   Admitted 8/13 - 8/19  Advised to go to the ED for dyspnea on exertion, orthopnea, left ankle pain and anemia.  Also with uncontrolled hypertension.  In ED BP 235/103, troponin 918, D-dimer 1.3, WBC 14, hemoglobin 9.1, creatinine 1.04.  Chest x-ray with prominent reticular opacities, mild fullness of the pulmonary vasculature.  CTA chest without PE, but multiple lung nodules, small right pleural effusion and mediastinal and hilar adenopathy.  Started on diuretics, 8/15 transfuse PRBC, prednisone  for gout.  Complicated by AKI from contrast nephropathy   Hypertensive emergency: Acute on chronic diastolic heart failure Echo EF 60-65%, grade 2 DD Likely prednisone  use around chemotherapy contributing to elevated BP and volume overload Improved with diuresis, complicated by AKI Discharged on Lasix  40 mg daily, hydralazine  100 mg 3 times daily, labetalol  400 mg twice daily, amlodipine  10 mg daily   Acute on chronic anemia: Hemoglobin down to 7.1-multifactorial-chronic disease, chemotherapy, iron  deficiency Transfused 1 unit PRBC, given IV iron  Hemoglobin stable around 7.6  Left ankle pain, swelling: Suspected gout flare Uric acid and ESR elevated Treated with prednisone  Improved  History of carotid artery stenosis: Continue Lipitor  Breast cancer on chemotherapy since May 2025: On chemotherapy Following with oncology  Generalized anxiety disorder Continue bupropion   Multiple lung nodules: Mediastinal and hilar adenopathy Oncology outpatient follow-up   Aldactone  12.5 mg     Medications and allergies reviewed with patient and updated if appropriate.  Current Outpatient Medications on File Prior to Visit  Medication Sig Dispense Refill   acetaminophen  (TYLENOL ) 325 MG tablet Take 650 mg by mouth every 6 (six)  hours as needed for moderate pain (pain score 4-6).     amLODipine  (NORVASC ) 10 MG tablet Take 1 tablet (10 mg total) by mouth daily. For high blood pressure 90 tablet 2   atorvastatin  (LIPITOR) 40 MG tablet Take 1 tablet (40 mg total) by mouth daily. 90 tablet 1   buPROPion  (WELLBUTRIN  XL) 150 MG 24 hr tablet TAKE 1 TABLET (150 MG TOTAL) BY MOUTH IN THE MORNING DAILY. (Patient not taking: Reported on 12/03/2023) 90 tablet 2   chlorthalidone  (HYGROTON ) 25 MG tablet Take 1 tablet (25 mg total) by mouth daily. 90 tablet 3   EPINEPHrine  0.3 mg/0.3 mL IJ SOAJ injection Inject 0.3 mg into the muscle as needed for anaphylaxis. 1 each 1   hydrALAZINE  (APRESOLINE ) 100 MG tablet Take 1 tablet (100 mg total) by mouth 3 (three) times daily. 90 tablet 5   labetalol  (NORMODYNE ) 200 MG tablet Take 2 tablets (400 mg total) by mouth 2 (two) times daily. 120 tablet 5   omeprazole  (PRILOSEC) 40 MG capsule TAKE 1 CAPSULE BY MOUTH EVERY DAY 90 capsule 0   No current facility-administered medications on file prior to visit.     Review of Systems     Objective:  There were no vitals filed for this visit. BP Readings from Last 3 Encounters:  12/03/23 (!) 195/55  11/29/23 (!) 170/70  11/26/23 (!) 164/64   Wt Readings from Last 3 Encounters:  12/03/23 158 lb 1.6 oz (71.7 kg)  11/29/23 156 lb 12.8 oz (71.1 kg)  11/27/23 148 lb (67.1 kg)   There is no height or weight on file to calculate BMI.    Physical Exam  Lab Results  Component Value Date   WBC 8.7 12/03/2023   HGB 9.5 (L) 12/03/2023   HCT 30.5 (L) 12/03/2023   PLT 344 12/03/2023   GLUCOSE 95 12/03/2023   CHOL 157 12/05/2021   TRIG 104.0 12/05/2021   HDL 50.70 12/05/2021   LDLDIRECT 141.8 07/31/2012   LDLCALC 86 12/05/2021   ALT 14 12/03/2023   AST 17 12/03/2023   NA 140 12/03/2023   K 4.2 12/03/2023   CL 106 12/03/2023   CREATININE 1.48 (H) 12/03/2023   BUN 29 (H) 12/03/2023   CO2 26 12/03/2023   TSH 1.23 05/04/2022   HGBA1C 5.9  05/04/2022     Assessment & Plan:    See Problem List for Assessment and Plan of chronic medical problems.

## 2023-12-03 NOTE — Telephone Encounter (Signed)
 Spoke with patient. She is aware of the medication changes and that she needs a bmet in a week (order is in). She would like this done at the cancer center. Is it possible to get that arranged ?  Thank you   Emiel Kielty

## 2023-12-03 NOTE — Progress Notes (Signed)
 [facility] Cancer Follow up:    [name], MD [address] [place] [address]   DIAGNOSIS:  Cancer Staging  Malignant neoplasm of lower-inner quadrant of left breast in female, estrogen receptor negative (HCC) Staging form: Breast, AJCC 8th Edition - Clinical stage from 08/28/2023: Stage IIB (cT2, cN0, cM0, G3, ER-, PR-, HER2-) - Signed by Loretha Ash, MD on 08/28/2023 Stage prefix: Initial diagnosis Histologic grading system: 3 grade system Laterality: Left Staged by: Pathologist and managing physician Stage used in treatment planning: Yes National guidelines used in treatment planning: Yes Type of national guideline used in treatment planning: NCCN    SUMMARY OF ONCOLOGIC HISTORY: Oncology History  Malignant neoplasm of lower-inner quadrant of left breast in female, estrogen receptor negative (HCC)  08/15/2023 Mammogram   Highly suspicious mass with associated calcifications in the left breast at 8 o'clock measuring 2.8 cm. The calcifications extend slightly outside of the mass by 0.5 cm or less. Resolution of the previously seen mass in the right breast at 12o'clock. No mammographic evidence of malignancy in the right breast.     08/20/2023 Pathology Results   Left breast needle core biopsy showed grade 3 IDC, triple negative.   08/26/2023 Initial Diagnosis   Malignant neoplasm of lower-inner quadrant of left breast in female, estrogen receptor negative (HCC)   08/28/2023 Cancer Staging   Staging form: Breast, AJCC 8th Edition - Clinical stage from 08/28/2023: Stage IIB (cT2, cN0, cM0, G3, ER-, PR-, HER2-) - Signed by Loretha Ash, MD on 08/28/2023 Stage prefix: Initial diagnosis Histologic grading system: 3 grade system Laterality: Left Staged by: Pathologist and managing physician Stage used in treatment planning: Yes National guidelines used in treatment planning: Yes Type of national guideline used in treatment planning: NCCN   09/19/2023  -  Chemotherapy   Patient is on Treatment Plan : BREAST TC q21d      Genetic Testing   [facility] CancerNext Panel+RNA was Negative. Report date is 09/08/2023.   The [facility] CancerNext+RNAinsight Panel includes sequencing, rearrangement analysis, and RNA analysis for the following 40 genes: APC, ATM, BAP1, BARD1, BMPR1A, BRCA1, BRCA2, BRIP1, CDH1, CDKN2A, CHEK2, FH, FLCN, MET, MLH1, MSH2, MSH6, MUTYH, NF1, NTHL1, PALB2, PMS2, PTEN, RAD51C, RAD51D, RPS20, SMAD4, STK11, TP53, TSC1, TSC2, and VHL (sequencing and deletion/duplication); AXIN2, HOXB13, MBD4, MSH3, POLD1 and POLE (sequencing only); EPCAM and GREM1 (deletion/duplication only).      CURRENT THERAPY: Taxotere /Cytoxan   INTERVAL HISTORY:  Discussed the use of AI scribe software for clinical note transcription with the patient, who gave verbal consent to proceed.  History of Present Illness  [name] is a 78 year old female with hypertension and breast cancer who presents with uncontrolled blood pressure during chemotherapy treatment.  She has a history of hypertension that has been difficult to control. She takes hydralazine  100 mg three times daily, along with another unspecified medication, but states 'it's not enough.' Her blood pressure has been as high as 193 mmHg, leading to hospitalizations where adjustments were made to her medication regimen. Upon discharge from the hospital, her blood pressure was around 150 mmHg. Despite these interventions, her blood pressure remains elevated, and she is asymptomatic with no chest pain, chest pressure, or leg swelling.  She is currently undergoing chemotherapy for breast cancer, having completed three out of four planned treatments. Her fourth treatment was scheduled for the 14th but was postponed due to uncontrolled blood pressure. The chemotherapy has not made her sick, except for a previous hospitalization. Her  breast mass is located in the lower inner quadrant of the left breast. She reports  that it is now difficult to feel.  She mentions a family reunion planned for September 20th, with family visiting from [place]  and [place] . She previously stayed with her sister after a surgery in 2007. She wants to return to work, indicating that she feels in good health despite her current medical challenges.    Patient Active Problem List   Diagnosis Date Noted   Chronic diastolic heart failure (HCC) 11/29/2023   Acute exacerbation of CHF (congestive heart failure) (HCC) 11/21/2023   Bilateral calf pain 11/21/2023   Hypertensive emergency 11/21/2023   History of breast cancer 11/21/2023   GAD (generalized anxiety disorder) 11/21/2023   DOE (dyspnea on exertion) 11/20/2023   Port-A-Cath in place 10/31/2023   Skin abnormality 09/30/2023   Genetic testing 09/11/2023   Malignant neoplasm of lower-inner quadrant of left breast in female, estrogen receptor negative (HCC) 08/26/2023   CKD (chronic kidney disease) stage 3, GFR 30-59 ml/min (HCC) 06/16/2023   Muscle cramping 07/09/2022   Weight loss 05/04/2022   Subacute cough 05/04/2022   Vitamin D  deficiency 12/04/2021   Low vitamin B12 level 12/04/2021   Clavicle enlargement 07/23/2018   Osteopenia 06/14/2018   Achilles tendinitis 01/29/2017   Bilateral carotid artery occlusion 01/29/2017   Neuropathy at site of breast surgery 01/28/2017   Prediabetes 07/24/2015   GERD (gastroesophageal reflux disease) 07/20/2015   Vitiligo 07/07/2012   PLANTAR FASCIITIS 06/16/2010   Tobacco dependence due to cigarettes 06/14/2009   Hyperlipidemia 11/10/2008   Angioedema 01/09/2008   Carotid stenosis 06/17/2007   Essential hypertension 06/12/2007   Personal history of malignant neoplasm of breast 02/25/2007   History of peripheral vascular disease 02/21/2007    is allergic to benazepril hcl and yellow dyes (non-tartrazine).  MEDICAL HISTORY: Past Medical History:  Diagnosis Date   Absence of menstruation    amenorrhea    Angioneurotic edema not elsewhere classified    due to ACE-I   Breast cancer (HCC)    Breast disorder    breast cancer 2007   Cancer [facility]) 2007   Left Breast Cancer   Carotid stenosis    Carotid US  (8/15):  RICA 40-59%; LICA 40-59% >>> FU 1 year   Chronic kidney disease    CKD3   GERD (gastroesophageal reflux disease)    H/O: rheumatic fever    History of breast cancer 07/13/2015   HLD (hyperlipidemia)    Occlusion and stenosis of carotid artery without mention of cerebral infarction    Other abnormal glucose    fasting hyperglycemia   Peripheral vascular disease (HCC)    Carotid Stenosis   Personal history of malignant neoplasm of breast    Personal history of radiation therapy 2007   Pneumonia    Pre-diabetes    Unspecified essential hypertension     SURGICAL HISTORY: Past Surgical History:  Procedure Laterality Date   APPENDECTOMY  1972   In [place]    BREAST BIOPSY Left 12/06/2005   malignant   BREAST BIOPSY Left 08/20/2023   US  LT BREAST BX W LOC DEV 1ST LESION IMG BX SPEC US  GUIDE 08/20/2023 [facility] MAMMOGRAPHY   BREAST LUMPECTOMY Left 01/08/2006   COLONOSCOPY  2005   negative; Dr Kristie   COLONOSCOPY N/A 09/02/2015   Procedure: COLONOSCOPY;  Surgeon: Margo LITTIE Haddock, MD;  Location: [facility];  Service: Endoscopy;  Laterality: N/A;  1215-moved to 1130 Ginger to notify pt   PORTACATH PLACEMENT  N/A 09/18/2023   Procedure: INSERTION, TUNNELED CENTRAL VENOUS DEVICE, WITH PORT;  Surgeon: Vanderbilt Ned, MD;  Location: [facility] OR;  Service: General;  Laterality: N/A;    SOCIAL HISTORY: Social History   Socioeconomic History   Marital status: Single    Spouse name: Not on file   Number of children: 0   Years of education: Not on file   Highest education level: High school graduate  Occupational History   Not on file  Tobacco Use   Smoking status: Every Day    Current packs/day: 0.50    Average packs/day: 0.5 packs/day for 48.0 years (24.0 ttl pk-yrs)    Types:  Cigarettes    Passive exposure: Current   Smokeless tobacco: Never   Tobacco comments:    Smokes daily.    Vaping Use   Vaping status: Never Used  Substance and Sexual Activity   Alcohol use: Yes    Alcohol/week: 1.0 standard drink of alcohol    Types: 1 Standard drinks or equivalent per week    Comment: occasionally   Drug use: No   Sexual activity: Yes    Birth control/protection: Post-menopausal  Other Topics Concern   Not on file  Social History Narrative   Single, no children.   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    Social Drivers of Corporate investment banker Strain: Low Risk  (11/25/2023)   Overall Financial Resource Strain (CARDIA)    Difficulty of Paying Living Expenses: Not very hard  Food Insecurity: No Food Insecurity (11/27/2023)   Hunger Vital Sign    Worried About Running Out of Food in the Last Year: Never true    Ran Out of Food in the Last Year: Never true  Transportation Needs: No Transportation Needs (11/27/2023)   PRAPARE - Administrator, Civil Service (Medical): No    Lack of Transportation (Non-Medical): No  Physical Activity: Sufficiently Active (05/31/2023)   Exercise Vital Sign    Days of Exercise per Week: 5 days    Minutes of Exercise per Session: 60 min  Stress: No Stress Concern Present (05/31/2023)   [employer] of Occupational Health - Occupational Stress Questionnaire    Feeling of Stress : Not at all  Social Connections: Socially Isolated (11/22/2023)   Social Connection and Isolation Panel    Frequency of Communication with Friends and Family: Twice a week    Frequency of Social Gatherings with Friends and Family: Once a week    Attends Religious Services: Never    Database administrator or Organizations: No    Attends Banker Meetings: Never    Marital Status: Never married  Intimate Partner Violence: Not At Risk (11/27/2023)   Humiliation, Afraid, Rape, and Kick questionnaire    Fear of Current  or Ex-Partner: No    Emotionally Abused: No    Physically Abused: No    Sexually Abused: No    FAMILY HISTORY: Family History  Problem Relation Age of Onset   Breast cancer Sister 70   Hypertension Other        some of Pts brothers and sisters   Heart attack Neg Hx    Stroke Neg Hx    Diabetes Neg Hx    Heart disease Neg Hx     Review of Systems  Constitutional:  Negative for appetite change, chills, fatigue, fever and unexpected weight change.  HENT:   Negative for hearing loss, lump/mass and trouble swallowing.   Eyes:  Negative for eye problems and icterus.  Respiratory:  Negative for chest tightness, cough and shortness of breath.   Cardiovascular:  Negative for chest pain, leg swelling and palpitations.  Gastrointestinal:  Negative for abdominal distention, abdominal pain, constipation, diarrhea, nausea and vomiting.  Endocrine: Negative for hot flashes.  Genitourinary:  Negative for difficulty urinating.   Musculoskeletal:  Negative for arthralgias.  Skin:  Negative for itching and rash.  Neurological:  Negative for dizziness, extremity weakness, headaches and numbness.  Hematological:  Negative for adenopathy. Does not bruise/bleed easily.  Psychiatric/Behavioral:  Negative for depression. The patient is not nervous/anxious.       PHYSICAL EXAMINATION   Onc Performance Status - 12/03/23 1200       KPS SCALE   KPS % SCORE Able to carry on normal activity, minor s/s of disease          Vitals:   12/03/23 1256  BP: (!) 195/55  Pulse: (!) 57  Resp: 18  Temp: 97.8 F (36.6 C)  SpO2: 99%    She appears well. No acute distress Left breast lower inner quadrant mass smaller.  No regional adenopathy CTA Bilaterally RRR No LE edema  LABORATORY DATA:  CBC    Component Value Date/Time   WBC 9.5 11/26/2023 0605   RBC 2.86 (L) 11/26/2023 0605   HGB 7.9 (L) 11/26/2023 0605   HGB 8.2 (L) 10/31/2023 0937   HGB 13.3 06/27/2020 1354   HGB 12.4 05/07/2011  1356   HCT 25.8 (L) 11/26/2023 0605   HCT 40.3 06/27/2020 1354   HCT 37.8 05/07/2011 1356   PLT 381 11/26/2023 0605   PLT 375 10/31/2023 0937   PLT 287 06/27/2020 1354   MCV 90.2 11/26/2023 0605   MCV 85 06/27/2020 1354   MCV 84.3 05/07/2011 1356   MCH 27.6 11/26/2023 0605   MCHC 30.6 11/26/2023 0605   RDW 19.0 (H) 11/26/2023 0605   RDW 13.1 06/27/2020 1354   RDW 12.9 05/07/2011 1356   LYMPHSABS 1.4 10/31/2023 0937   LYMPHSABS 4.4 (H) 06/27/2020 1354   LYMPHSABS 5.1 (H) 05/07/2011 1356   MONOABS 0.4 10/31/2023 0937   MONOABS 0.5 05/07/2011 1356   EOSABS 0.0 10/31/2023 0937   EOSABS 0.1 06/27/2020 1354   BASOSABS 0.0 10/31/2023 0937   BASOSABS 0.0 06/27/2020 1354   BASOSABS 0.1 05/07/2011 1356    CMP     Component Value Date/Time   NA 141 11/26/2023 0605   NA 136 06/27/2020 1354   K 4.1 11/26/2023 0605   CL 108 11/26/2023 0605   CO2 22 11/26/2023 0605   GLUCOSE 89 11/26/2023 0605   BUN 57 (H) 11/26/2023 0605   BUN 15 06/27/2020 1354   CREATININE 1.83 (H) 11/26/2023 0605   CREATININE 1.49 (H) 10/31/2023 0937   CALCIUM  9.0 11/26/2023 0605   PROT 6.3 (L) 11/21/2023 0608   PROT 7.5 01/31/2021 1027   ALBUMIN 2.5 (L) 11/21/2023 0608   ALBUMIN 4.3 01/31/2021 1027   AST 14 (L) 11/21/2023 0608   AST 15 10/31/2023 0937   ALT 10 11/21/2023 0608   ALT 13 10/31/2023 0937   ALKPHOS 70 11/21/2023 0608   BILITOT 0.8 11/21/2023 0608   BILITOT 0.5 10/31/2023 0937   GFRNONAA 28 (L) 11/26/2023 0605   GFRNONAA 36 (L) 10/31/2023 0937     ASSESSMENT and THERAPY PLAN:   Assessment and Plan Assessment & Plan Triple negative breast cancer of the left breast, post-neoadjuvant chemotherapy, planned for surgery Completed three out of four chemotherapy  treatments.  Given uncontrolled HTN, we agreed to omit C4 of chemotherapy. - Repeat MRI breast to assess response to neoadj chemotherapy - Schedule appointment with Dr. Vanderbilt for surgical planning.  FU in 4 weeks approximately with  me.  Uncontrolled hypertension Blood pressure remains uncontrolled despite current regimen. Not attributed to chemotherapy. Asymptomatic but at risk for complications. Blood pressure control necessary - Contact cardiologist to adjust antihypertensive regimen. - Advise her to seek medical attention if experiencing chest pain, chest pressure, or severe headaches.  All questions were answered. The patient knows to call the clinic with any problems, questions or concerns. We can certainly see the patient much sooner if necessary.  Total encounter time:30 minutes*in face-to-face visit time, chart review, lab review, care coordination, order entry, and documentation of the encounter time.  *Total Encounter Time as defined by the Centers for Medicare and Medicaid Services includes, in addition to the face-to-face time of a patient visit (documented in the note above) non-face-to-face time: obtaining and reviewing outside history, ordering and reviewing medications, tests or procedures, care coordination (communications with other health care professionals or caregivers) and documentation in the medical record.

## 2023-12-03 NOTE — Telephone Encounter (Signed)
 Left message for patient to call the office back. Per Manuelita Dutch PA-C, needs to stop Lasix , start Chlorthalidone  25 mg daily. Will need BMET next week which can be done at the cancer center

## 2023-12-04 ENCOUNTER — Ambulatory Visit: Admitting: Cardiovascular Disease

## 2023-12-04 ENCOUNTER — Encounter: Payer: Self-pay | Admitting: *Deleted

## 2023-12-04 ENCOUNTER — Telehealth: Payer: Self-pay | Admitting: *Deleted

## 2023-12-04 ENCOUNTER — Inpatient Hospital Stay: Admitting: Internal Medicine

## 2023-12-04 DIAGNOSIS — Z8739 Personal history of other diseases of the musculoskeletal system and connective tissue: Secondary | ICD-10-CM

## 2023-12-04 DIAGNOSIS — I161 Hypertensive emergency: Secondary | ICD-10-CM

## 2023-12-04 DIAGNOSIS — N1832 Chronic kidney disease, stage 3b: Secondary | ICD-10-CM

## 2023-12-04 DIAGNOSIS — I10 Essential (primary) hypertension: Secondary | ICD-10-CM

## 2023-12-04 DIAGNOSIS — I5033 Acute on chronic diastolic (congestive) heart failure: Secondary | ICD-10-CM

## 2023-12-04 NOTE — Telephone Encounter (Signed)
 Attempted to reach pt to let her know about rescheduling her MRI to assess response to chemo.  Voicemail was full: unable to leave VM.  Emailed her per the information in her chart.

## 2023-12-05 ENCOUNTER — Telehealth: Payer: Self-pay | Admitting: *Deleted

## 2023-12-05 NOTE — Telephone Encounter (Signed)
 Spoke with patient to let her know that her appt with Dr. Vanderbilt at [facility] is 9/5 at 11:40am. She would also like me to email it to her.Verified email and sent.

## 2023-12-06 ENCOUNTER — Ambulatory Visit
Admission: RE | Admit: 2023-12-06 | Discharge: 2023-12-06 | Disposition: A | Discharge status: Home / Self Care | Source: Ambulatory Visit | Attending: Hematology and Oncology | Admitting: Hematology and Oncology

## 2023-12-06 DIAGNOSIS — R928 Other abnormal and inconclusive findings on diagnostic imaging of breast: Secondary | ICD-10-CM | POA: Diagnosis not present

## 2023-12-06 DIAGNOSIS — C50312 Malignant neoplasm of lower-inner quadrant of left female breast: Secondary | ICD-10-CM

## 2023-12-06 DIAGNOSIS — Z853 Personal history of malignant neoplasm of breast: Secondary | ICD-10-CM | POA: Diagnosis not present

## 2023-12-06 MED ORDER — GADOPICLENOL 0.5 MMOL/ML IV SOLN
7.5000 mL | Freq: Once | INTRAVENOUS | Status: AC | PRN
  Administered 2023-12-06: 7.5 mL via INTRAVENOUS

## 2023-12-10 ENCOUNTER — Encounter: Payer: Self-pay | Admitting: *Deleted

## 2023-12-13 ENCOUNTER — Ambulatory Visit: Payer: Self-pay | Admitting: Surgery

## 2023-12-13 DIAGNOSIS — C50912 Malignant neoplasm of unspecified site of left female breast: Secondary | ICD-10-CM | POA: Diagnosis not present

## 2023-12-16 ENCOUNTER — Telehealth: Payer: Self-pay | Admitting: Family

## 2023-12-16 DIAGNOSIS — Z171 Estrogen receptor negative status [ER-]: Secondary | ICD-10-CM | POA: Diagnosis not present

## 2023-12-16 DIAGNOSIS — C50312 Malignant neoplasm of lower-inner quadrant of left female breast: Secondary | ICD-10-CM | POA: Diagnosis not present

## 2023-12-16 DIAGNOSIS — Z923 Personal history of irradiation: Secondary | ICD-10-CM | POA: Diagnosis not present

## 2023-12-16 NOTE — Telephone Encounter (Signed)
 Patient was called on 8/27, 8/29, 9/4, on those day she said she had something else to do and couldn't come for sooner appt.  Today when called for soon appt on 9/11 at 8:50, she said she wasn't getting up that early and she will wait for appt in November.

## 2023-12-17 NOTE — Telephone Encounter (Signed)
 Patient called asking I was seen yesterday and I have been looking at this brochure and want to know what would be the difference if I just have my right breast lifted instead of reconstruction? Per Dr. Arelia note patient maybe planning bilateral mastectomies therefore lift not an option post mastectomy. Patient asked to speak with Oncologist first to get there recommendations regarding getting bilateral mastectomies. Patient expresses information yesterday was a lot and try to avoid getting all that surgery. Patient reminded she has an appointment with Dr. Loretha on 01/01/24 and patient states she was not aware of appointment and asked to call Dr. Loretha office to confirm this appointment. Patient verbalized understanding.

## 2023-12-19 ENCOUNTER — Telehealth: Payer: Self-pay | Admitting: *Deleted

## 2023-12-19 ENCOUNTER — Encounter: Payer: Self-pay | Admitting: *Deleted

## 2023-12-19 NOTE — Telephone Encounter (Signed)
 ----- Message from Verdigris B sent at 12/19/2023  3:07 PM EDT ----- Regarding: RE: QUESTIONS Clearance request faxed. Thanks ----- Message ----- From: Wynetta Niels HERO, CMA Sent: 12/19/2023   2:55 PM EDT To: Rosaline Sprang; Devere HERO Collet, RN; Keisha# Subject: RE: QUESTIONS                                  Rosaline Sprang, , if you will please fax over a clearance request to [phone] attn:preop team.   Thank you Niels preop team ----- Message ----- From: Vannie Reche RAMAN, NP Sent: 12/19/2023   2:45 PM EDT To: Rosaline Sprang; Devere HERO Collet, RN; Keisha# Subject: RE: QUESTIONS                                  We can get her in sooner, it may have to be with general cardiology based on availability instead of [facility] for clearance which would be okay.  We typically need a formal clearance request to be able to best address the issue, ensure the recommendations are routed  correctly to the surgical team. This can be faxed to [phone]. I'm adding our preop callback team as well so they can help coordinate with [facility] the scheduling.   Reche RAMAN Vannie, NP ----- Message ----- From: Sprang Rosaline Sent: 12/19/2023   2:31 PM EDT To: Devere HERO Collet, RN; Nanetta LOISE Lars, RN; C# Subject: RE: QUESTIONS                                  I spoke to patient today and she told me she was offered earlier appt but we did not know she was just dx with CHF and will need clearance. Pt said she was going to call when she had a chance to move appt up. If someone could reach out to her that would be great. ----- Message ----- From: Vannie Reche RAMAN, NP Sent: 12/19/2023   2:16 PM EDT To: Rosaline Sprang; Devere HERO Collet, RN; Keisha# Subject: RE: QUESTIONS                                  Erskin Nanetta,   Our wonderful scheduler Jonette called her for a sooner visit 8/27, 8/29, 9/4, on those day she said she had something else to do and couldn't come for sooner appt.  She was  most recently called 12/16/23 for  sooner office visit 12/19/23 and declined due to early appointment time and she will wait for appt in November. Is she aware of need for sooner appointment? Jonette put a lot of work into trying to get her in sooner and we are happy to call her again but I worry if she doesn't understand the need she will continue to decline.   Reche RAMAN Vannie, NP ----- Message ----- From: Lars Nanetta LOISE, RN Sent: 12/19/2023   2:10 PM EDT To: Rosaline Sprang; Devere HERO Collet, RN; Caitli# Subject: RE: QUESTIONS                                  Erskin Reche - I am one of the nurse navigators  from the cancer center.  Could this patient get seen sooner than November?  She finished chemo and now needs surgery and will need cardiac clearance prior to. Could some one reach out to the patient if this is possible?   Thanks,  Nanetta ----- Message ----- From: Burnetta Browning Sent: 12/19/2023  10:53 AM EDT To: Devere CHRISTELLA Collet, RN; Nanetta LOISE Lars, RN Subject: EMELDA: QUESTIONS                                  FYI ----- Message ----- From: Burnetta Browning Sent: 12/19/2023  10:52 AM EDT To: Debby Shipper, MD; Earlis Ranks, MD; To# Subject: RE: QUESTIONS                                  Pt states after seeing Dr. Ranks she does not want to do masty on both sides and wants to discuss doing a lift on right side instead. Pt made aware I will let BT know so she can reach out to her. On another note, she was just seen in ED and diagnosed with CHF and has not followed up with cardiology yet. Pt made aware she will need to see them for clearance prior to any sx. She will call cardiology and try to move her Nov appt up for clearance.   Thanks ----- Message ----- From: Lanier Bascom SAUNDERS Sent: 12/19/2023   9:46 AM EDT To: Debby Shipper, MD; Browning Burnetta Subject: QUESTIONS                                       Good morning,  I spoke with this patient today to schedule procedure and  sounds like she has changed her mind about bilat masties.  She has some questions and would like a call from someone before scheduling.  Tonya G. Surgery scheduler

## 2023-12-19 NOTE — Telephone Encounter (Addendum)
 She was seen in [facility] clinic as a one time visit and will not be followed by [facility]. BP was really uncontrolled at the visit and meds were adjusted. I think she needs to be reassessed in clinic by a provider before being cleared for a surgery.

## 2023-12-19 NOTE — Telephone Encounter (Signed)
   Pre-operative Risk Assessment    Patient Name: [name]  DOB: 19-Dec-1945 MRN: [id number]   Date of last office visit: 11/27/21 DR. WONDA Date of next office visit: 02/13/24 CAITLIN WALKER, NP   Request for Surgical Clearance    Procedure:  MASTECTOMY  Date of Surgery:  Clearance TBD                                Surgeon:  DR. DEBBY SHIPPER Surgeon's Group or Practice Name:  [facility] Phone number:  [phone] Fax number:  [phone] MICHELLE BROOKS, CMA   Type of Clearance Requested:   - Medical ; NONE INDICATED TO BE HELD   Type of Anesthesia:  General    Additional requests/questions:    Bonney Niels Jest   12/19/2023, 3:47 PM

## 2023-12-19 NOTE — Telephone Encounter (Signed)
 I d/w Reche Finder, NP who states she can see the pt sooner for the preop clearance. Pt has been scheduled to see Reche Finder, NP 12/23/23 @ 8:50. Pt has been given address to [facility] thru [employer] as she is driving right now.   I will update all parties involved. Pt aware she will keep her 02/2024 appt with Reche Finder, NP.

## 2023-12-20 ENCOUNTER — Encounter (HOSPITAL_BASED_OUTPATIENT_CLINIC_OR_DEPARTMENT_OTHER): Payer: Self-pay

## 2023-12-23 ENCOUNTER — Ambulatory Visit (INDEPENDENT_AMBULATORY_CARE_PROVIDER_SITE_OTHER): Admitting: Family

## 2023-12-23 ENCOUNTER — Encounter (HOSPITAL_BASED_OUTPATIENT_CLINIC_OR_DEPARTMENT_OTHER): Payer: Self-pay | Admitting: Family

## 2023-12-23 VITALS — BP 150/54 | HR 55 | Resp 16 | Ht 64.0 in | Wt 150.0 lb

## 2023-12-23 DIAGNOSIS — Z01810 Encounter for preprocedural cardiovascular examination: Secondary | ICD-10-CM | POA: Diagnosis not present

## 2023-12-23 DIAGNOSIS — I5032 Chronic diastolic (congestive) heart failure: Secondary | ICD-10-CM

## 2023-12-23 DIAGNOSIS — I10 Essential (primary) hypertension: Secondary | ICD-10-CM | POA: Diagnosis not present

## 2023-12-23 MED ORDER — ISOSORBIDE MONONITRATE ER 30 MG PO TB24: 30.0000 mg | ORAL_TABLET | Freq: Every day | ORAL | 1 refills | Status: AC

## 2023-12-23 NOTE — Progress Notes (Signed)
 Advanced Hypertension Clinic Assessment:    Date:  12/23/2023   ID:  [name], DOB Aug 09, 1945, MRN [id number]  PCP:  [name], MD  Cardiologist:  Ozell Fell, MD  Nephrologist:  Referring MD: [name], MD   CC: Hypertension  History of Present Illness:    [name] is a 78 y.o. female with a hx of breast cancer on chemotherapy since 08/2023, CKDIIIa,carotid artery stenosis, PAD, vertigo, HFpEF, HTN.  Admitted 11/2023 with volume overload in the setting of hypertensive emergency.  It was suspected chemotherapy and steroid use may be contributory.  Echo LVEF 60 to 65%, grade 2 diastolic dysfunction, normal RV.  Hospitalization complicated by AKI and nephrology consulted and suspected contrast-induced nephropathy peak.  She also had acute on chronic anemia requiring transfusion and IV iron .  She was seen by the heart failure clinic 11/29/2023 doing overall well well since discharge.  She was recommended for improved blood pressure control to reduce risk of heart failure exacerbation.  She was maintained on labetalol  400 twice daily, hydralazine  100 3 times daily, amlodipine  10 mg daily.  She was recommended for updated edema and consideration of resumption of spironolactone .  Her oncologist consulted it was noted that her present chemotherapy regimen typically does not cause Retention but can increase risk of fluid retention.  Presents today for preop clearance for mastectomy with Dr. Vanderbilt. She notes BP has been difficult to control. Blood pressure not checked routinely at home. She does have a cuff but finds it difficult to use without assistance.   she reports tobacco use some days with cigarettes. Taking medications in the morning 7-7:30am, midday, and 11pm. Reports no shortness of breath at rest and mild dyspnea on exertion with more than usual activity. Reports no chest pain, pressure, or tightness. No edema, orthopnea, PND. Reports no palpitations.     Previous  antihypertensives: ACE-angioedema   Past Medical History:  Diagnosis Date   Absence of menstruation    amenorrhea   Angioneurotic edema not elsewhere classified    due to ACE-I   Breast cancer (HCC)    Breast disorder    breast cancer 2007   Cancer [facility]) 2007   Left Breast Cancer   Carotid stenosis    Carotid US  (8/15):  RICA 40-59%; LICA 40-59% >>> FU 1 year   Chronic kidney disease    CKD3   GERD (gastroesophageal reflux disease)    H/O: rheumatic fever    History of breast cancer 07/13/2015   HLD (hyperlipidemia)    Occlusion and stenosis of carotid artery without mention of cerebral infarction    Other abnormal glucose    fasting hyperglycemia   Peripheral vascular disease (HCC)    Carotid Stenosis   Personal history of malignant neoplasm of breast    Personal history of radiation therapy 2007   Pneumonia    Pre-diabetes    Unspecified essential hypertension     Past Surgical History:  Procedure Laterality Date   APPENDECTOMY  1972   In [place]    BREAST BIOPSY Left 12/06/2005   malignant   BREAST BIOPSY Left 08/20/2023   US  LT BREAST BX W LOC DEV 1ST LESION IMG BX SPEC US  GUIDE 08/20/2023 [facility]   BREAST LUMPECTOMY Left 01/08/2006   COLONOSCOPY  2005   negative; Dr Kristie   COLONOSCOPY N/A 09/02/2015   Procedure: COLONOSCOPY;  Surgeon: Margo LITTIE Haddock, MD;  Location: [facility];  Service: Endoscopy;  Laterality: N/A;  1215-moved to 1130 Ginger  to notify pt   PORTACATH PLACEMENT N/A 09/18/2023   Procedure: INSERTION, TUNNELED CENTRAL VENOUS DEVICE, WITH PORT;  Surgeon: Vanderbilt Ned, MD;  Location: [facility] OR;  Service: General;  Laterality: N/A;    Current Medications: Current Meds  Medication Sig   acetaminophen  (TYLENOL ) 325 MG tablet Take 650 mg by mouth every 6 (six) hours as needed for moderate pain (pain score 4-6).   amLODipine  (NORVASC ) 10 MG tablet Take 1 tablet (10 mg total) by mouth daily. For high blood pressure   atorvastatin   (LIPITOR) 40 MG tablet Take 1 tablet (40 mg total) by mouth daily.   buPROPion  (WELLBUTRIN  XL) 150 MG 24 hr tablet TAKE 1 TABLET (150 MG TOTAL) BY MOUTH IN THE MORNING DAILY.   chlorthalidone  (HYGROTON ) 25 MG tablet Take 1 tablet (25 mg total) by mouth daily.   EPINEPHrine  0.3 mg/0.3 mL IJ SOAJ injection Inject 0.3 mg into the muscle as needed for anaphylaxis.   hydrALAZINE  (APRESOLINE ) 100 MG tablet Take 1 tablet (100 mg total) by mouth 3 (three) times daily.   isosorbide  mononitrate (IMDUR ) 30 MG 24 hr tablet Take 1 tablet (30 mg total) by mouth daily.   labetalol  (NORMODYNE ) 200 MG tablet Take 2 tablets (400 mg total) by mouth 2 (two) times daily.   lidocaine -prilocaine  (EMLA ) cream Apply 1 Application topically as needed.   omeprazole  (PRILOSEC) 40 MG capsule TAKE 1 CAPSULE BY MOUTH EVERY DAY     Allergies:   Benazepril hcl and Yellow dyes (non-tartrazine)   Social History   Socioeconomic History   Marital status: Single    Spouse name: Not on file   Number of children: 0   Years of education: Not on file   Highest education level: High school graduate  Occupational History   Not on file  Tobacco Use   Smoking status: Some Days    Current packs/day: 0.50    Average packs/day: 0.5 packs/day for 48.0 years (24.0 ttl pk-yrs)    Types: Cigarettes    Passive exposure: Current   Smokeless tobacco: Never   Tobacco comments:    Smokes daily.    Vaping Use   Vaping status: Never Used  Substance and Sexual Activity   Alcohol use: Yes    Alcohol/week: 1.0 standard drink of alcohol    Types: 1 Standard drinks or equivalent per week    Comment: occasionally   Drug use: No   Sexual activity: Yes    Birth control/protection: Post-menopausal  Other Topics Concern   Not on file  Social History Narrative   Single, no children.   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    Social Drivers of Corporate investment banker Strain: Low Risk  (11/25/2023)   Overall Financial Resource  Strain (CARDIA)    Difficulty of Paying Living Expenses: Not very hard  Food Insecurity: No Food Insecurity (11/27/2023)   Hunger Vital Sign    Worried About Running Out of Food in the Last Year: Never true    Ran Out of Food in the Last Year: Never true  Transportation Needs: No Transportation Needs (11/27/2023)   PRAPARE - Administrator, Civil Service (Medical): No    Lack of Transportation (Non-Medical): No  Physical Activity: Sufficiently Active (05/31/2023)   Exercise Vital Sign    Days of Exercise per Week: 5 days    Minutes of Exercise per Session: 60 min  Stress: No Stress Concern Present (05/31/2023)   [employer] of Occupational Health -  Occupational Stress Questionnaire    Feeling of Stress : Not at all  Social Connections: Socially Isolated (11/22/2023)   Social Connection and Isolation Panel    Frequency of Communication with Friends and Family: Twice a week    Frequency of Social Gatherings with Friends and Family: Once a week    Attends Religious Services: Never    Database administrator or Organizations: No    Attends Engineer, structural: Never    Marital Status: Never married     Family History: The patient's family history includes Breast cancer (age of onset: 92) in her sister; Hypertension in an other family member. There is no history of Heart attack, Stroke, Diabetes, or Heart disease.  ROS:   Please see the history of present illness.     All other systems reviewed and are negative.  EKGs/Labs/Other Studies Reviewed:    EKG Interpretation Date/Time:  Monday December 23 2023 09:02:31 EDT Ventricular Rate:  48 PR Interval:  170 QRS Duration:  84 QT Interval:  478 QTC Calculation: 427 R Axis:   -13  Text Interpretation: Sinus bradycardia Confirmed by Vannie Mora ([id number]) on 12/23/2023 9:18:55 AM    Recent Labs: 11/20/2023: B Natriuretic Peptide 918.9; Pro B Natriuretic peptide (BNP) 1,267.0 12/03/2023: ALT 14; BUN 29;  Creatinine 1.48; Hemoglobin 9.5; Platelet Count 344; Potassium 4.2; Sodium 140   Recent Lipid Panel    Component Value Date/Time   CHOL 157 12/05/2021 0941   CHOL 181 01/31/2021 1027   TRIG 104.0 12/05/2021 0941   HDL 50.70 12/05/2021 0941   HDL 64 01/31/2021 1027   CHOLHDL 3 12/05/2021 0941   VLDL 20.8 12/05/2021 0941   LDLCALC 86 12/05/2021 0941   LDLCALC 95 01/31/2021 1027   LDLDIRECT 141.8 07/31/2012 1537    Physical Exam:   VS:  BP (!) 150/54 (BP Location: Left Arm, Patient Position: Sitting, Cuff Size: Normal)   Pulse (!) 55   Resp 16   Ht 5' 4 (1.626 m)   Wt 150 lb (68 kg)   SpO2 93%   BMI 25.75 kg/m  , BMI Body mass index is 25.75 kg/m. GENERAL:  Well appearing HEENT: Pupils equal round and reactive, fundi not visualized, oral mucosa unremarkable NECK:  No jugular venous distention, waveform within normal limits, carotid upstroke brisk and symmetric, no bruits, no thyromegaly LYMPHATICS:  No cervical adenopathy LUNGS:  Clear to auscultation bilaterally HEART:  RRR.  PMI not displaced or sustained,S1 and S2 within normal limits, no S3, no S4, no clicks, no rubs, no murmurs ABD:  Flat, positive bowel sounds normal in frequency in pitch, no bruits, no rebound, no guarding, no midline pulsatile mass, no hepatomegaly, no splenomegaly EXT:  2 plus pulses throughout, no edema, no cyanosis no clubbing SKIN:  No rashes no nodules NEURO:  Cranial nerves II through XII grossly intact, motor grossly intact throughout PSYCH:  Cognitively intact, oriented to person place and time   ASSESSMENT/PLAN:    Preop - According to the Revised Cardiac Risk Index (RCRI), her Perioperative Risk of Major Cardiac Event is (%): 6.6. Her Functional Capacity in METs is: 5.07 according to the Duke Activity Status Index (DASI). Per AHA/ACC guidelines, she is deemed acceptable risk for the planned procedure without additional cardiovascular testing. Will route to surgical team so they are aware.     HTN - BP not at goal <130/80. Continue hydralazine  100mg  TID, amlodipine  10mg  daily, labetolol 400mg  BID. Rx Imdur  30mg  daily. Can further increase at follow  up. Discussed to monitor BP at home at least 2 hours after medications and sitting for 5-10 minutes.   Sinus bradycardia - noted by EKG today SB 48 bpm. HR with ambulation 55bpm. Likely related to her labetolol. She is asymptomatic with no lightheadedness, dizziness. Given her BP remains poorly controlled and agent choice limited by prior intolerances, will continue monitoring with periodic EKG.   HFpEF - Euvolemic and well compensated on exam. GDMT hydralazine  100mg  TID, Imdur  30mg  daily, labetolol 400mg  BID. No ARB/ARNI due to prior angioedema with ACE. No indication for loop diuretic. Low sodium diet, fluid restriction <2L, and daily weights encouraged. Educated to contact our office for weight gain of 2 lbs overnight or 5 lbs in one week.   Carotid stenosis -11/26/2021 bilateral 1-39% stenosis. No lightheadedness, amaurosis fugax. No indication for repeat testing at this time.   HLD, LDL goal <70 - Continue Atorvastatin  40mg  daily. Not addressed at this visit.   Screening for Secondary Hypertension:     Relevant Labs/Studies:    Latest Ref Rng & Units 12/03/2023   12:39 PM 11/26/2023    6:05 AM 11/25/2023    6:01 AM  Basic Labs  Sodium 135 - 145 mmol/L 140  141  140   Potassium 3.5 - 5.1 mmol/L 4.2  4.1  4.1   Creatinine 0.44 - 1.00 mg/dL 8.51  8.16  7.66        Latest Ref Rng & Units 05/04/2022    1:55 PM 12/05/2021    9:41 AM  Thyroid    TSH 0.35 - 5.50 uIU/mL 1.23  1.31                   Disposition:    FU with MD/APP/PharmD as scheduled in November   Medication Adjustments/Labs and Tests Ordered: Current medicines are reviewed at length with the patient today.  Concerns regarding medicines are outlined above.  Orders Placed This Encounter  Procedures   EKG 12-Lead   Meds ordered this encounter  Medications    isosorbide  mononitrate (IMDUR ) 30 MG 24 hr tablet    Sig: Take 1 tablet (30 mg total) by mouth daily.    Dispense:  90 tablet    Refill:  1    Pt reports no issues with yellow dyes    Supervising Provider:   LONNI SLAIN [[id number]]     Signed, Reche GORMAN Finder, NP  12/23/2023 8:23 PM    [facility]

## 2023-12-23 NOTE — Patient Instructions (Addendum)
 Medication Instructions:  START Isosorbide  Mononitrate (Imdur ) 30mg  daily   Testing/Procedures: Your EKG today showed sinus bradycardia. This is a normal but slow heart rhythm. If you experience lightheadedness, dizziness please call and let us  know.    Follow-Up: As scheduled in November   Special Instructions:    We will send a note to Dr. Vanderbilt that you are good to go for surgery.  Recommend checking blood pressure three times per week.   Tips to Measure your Blood Pressure Correctly  Here's what you can do to ensure a correct reading:  Don't drink a caffeinated beverage or smoke during the 30 minutes before the test.  Sit quietly for five minutes before the test begins.  During the measurement, sit in a chair with your feet on the floor and your arm supported so your elbow is at about heart level.  The inflatable part of the cuff should completely cover at least 80% of your upper arm, and the cuff should be placed on bare skin, not over a shirt.  Don't talk during the measurement.  Have your blood pressure measured twice, with a brief break in between. If the readings are different by 5 points or more, have it done a third time.  Blood pressure categories  Blood pressure category SYSTOLIC (upper number)  DIASTOLIC (lower number)  Normal Less than 120 mm Hg and Less than 80 mm Hg  Elevated 120-129 mm Hg and Less than 80 mm Hg  High blood pressure: Stage 1 hypertension 130-139 mm Hg or 80-89 mm Hg  High blood pressure: Stage 2 hypertension 140 mm Hg or higher or 90 mm Hg or higher  Hypertensive crisis (consult your doctor immediately) Higher than 180 mm Hg and/or Higher than 120 mm Hg  Source: American Heart Association and American Stroke Association. For more on getting your blood pressure under control, buy Controlling Your Blood Pressure, a Special Health Report from [facility].

## 2023-12-27 ENCOUNTER — Ambulatory Visit: Payer: Self-pay | Admitting: Surgery

## 2023-12-27 DIAGNOSIS — C50912 Malignant neoplasm of unspecified site of left female breast: Secondary | ICD-10-CM | POA: Diagnosis not present

## 2023-12-31 ENCOUNTER — Encounter: Payer: Self-pay | Admitting: *Deleted

## 2024-01-01 ENCOUNTER — Inpatient Hospital Stay: Attending: Hematology and Oncology | Admitting: Hematology and Oncology

## 2024-01-01 VITALS — BP 128/85 | HR 57 | Temp 98.2°F | Resp 16 | Wt 153.8 lb

## 2024-01-01 DIAGNOSIS — C50312 Malignant neoplasm of lower-inner quadrant of left female breast: Secondary | ICD-10-CM | POA: Diagnosis not present

## 2024-01-01 DIAGNOSIS — F17210 Nicotine dependence, cigarettes, uncomplicated: Secondary | ICD-10-CM | POA: Insufficient documentation

## 2024-01-01 DIAGNOSIS — Z171 Estrogen receptor negative status [ER-]: Secondary | ICD-10-CM | POA: Diagnosis not present

## 2024-01-01 DIAGNOSIS — Z9221 Personal history of antineoplastic chemotherapy: Secondary | ICD-10-CM | POA: Insufficient documentation

## 2024-01-01 DIAGNOSIS — I10 Essential (primary) hypertension: Secondary | ICD-10-CM | POA: Insufficient documentation

## 2024-01-01 NOTE — Progress Notes (Signed)
 [facility] Cancer Follow up:    Kimberly Glade PARAS, MD [address] [place] [address]   DIAGNOSIS:  Cancer Staging  Malignant neoplasm of lower-inner quadrant of left breast in female, estrogen receptor negative (HCC) Staging form: Breast, AJCC 8th Edition - Clinical stage from 08/28/2023: Stage IIB (cT2, cN0, cM0, G3, ER-, PR-, HER2-) - Signed by Kimberly Ash, MD on 08/28/2023 Stage prefix: Initial diagnosis Histologic grading system: 3 grade system Laterality: Left Staged by: Pathologist and managing physician Stage used in treatment planning: Yes National guidelines used in treatment planning: Yes Type of national guideline used in treatment planning: NCCN    SUMMARY OF ONCOLOGIC HISTORY: Oncology History  Malignant neoplasm of lower-inner quadrant of left breast in female, estrogen receptor negative (HCC)  08/15/2023 Mammogram   Highly suspicious mass with associated calcifications in the left breast at 8 o'clock measuring 2.8 cm. The calcifications extend slightly outside of the mass by 0.5 cm or less. Resolution of the previously seen mass in the right breast at 12o'clock. No mammographic evidence of malignancy in the right breast.     08/20/2023 Pathology Results   Left breast needle core biopsy showed grade 3 IDC, triple negative.   08/26/2023 Initial Diagnosis   Malignant neoplasm of lower-inner quadrant of left breast in female, estrogen receptor negative (HCC)   08/28/2023 Cancer Staging   Staging form: Breast, AJCC 8th Edition - Clinical stage from 08/28/2023: Stage IIB (cT2, cN0, cM0, G3, ER-, PR-, HER2-) - Signed by Kimberly Ash, MD on 08/28/2023 Stage prefix: Initial diagnosis Histologic grading system: 3 grade system Laterality: Left Staged by: Pathologist and managing physician Stage used in treatment planning: Yes National guidelines used in treatment planning: Yes Type of national guideline used in treatment planning: NCCN   09/19/2023  - 11/02/2023 Chemotherapy   Patient is on Treatment Plan : BREAST TC q21d      Genetic Testing   [facility] CancerNext Panel+RNA was Negative. Report date is 09/08/2023.   The [facility] CancerNext+RNAinsight Panel includes sequencing, rearrangement analysis, and RNA analysis for the following 40 genes: APC, ATM, BAP1, BARD1, BMPR1A, BRCA1, BRCA2, BRIP1, CDH1, CDKN2A, CHEK2, FH, FLCN, MET, MLH1, MSH2, MSH6, MUTYH, NF1, NTHL1, PALB2, PMS2, PTEN, RAD51C, RAD51D, RPS20, SMAD4, STK11, TP53, TSC1, TSC2, and VHL (sequencing and deletion/duplication); AXIN2, HOXB13, MBD4, MSH3, POLD1 and POLE (sequencing only); EPCAM and GREM1 (deletion/duplication only).      CURRENT THERAPY: Taxotere /Cytoxan   INTERVAL HISTORY:  Discussed the use of AI scribe software for clinical note transcription with the patient, who gave verbal consent to proceed.  History of Present Illness   [name] is a 78 year old female with breast cancer who presents for follow-up regarding her upcoming surgery.  She is scheduled for a left simple mastectomy on October 14th. She has previously undergone radiation therapy on the left side. She prefers a lift on the right side rather than a mastectomy, as she is concerned about asymmetry and sagging.  Her breast cancer is triple negative, and she has completed chemotherapy. No significant side effects from chemotherapy are reported, except for a hospitalization due to very high blood pressure after the third session. She is currently not on any chemotherapy medications.  Her blood pressure has improved since her hospitalization, and she confirms adherence to her medications. She recalls a previous concern about heart failure during her hospital stay, but no current symptoms are reported.  She mentions that her speech is slightly slurred, possibly due to fatigue from working.  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 No  tingling or numbness is reported.    Patient Active Problem List   Diagnosis Date Noted   History of gout 12/03/2023   Chronic diastolic heart failure (HCC) 11/29/2023   Acute exacerbation of CHF (congestive heart failure) (HCC) 11/21/2023   Bilateral calf pain 11/21/2023   Hypertensive emergency 11/21/2023   History of breast cancer 11/21/2023   GAD (generalized anxiety disorder) 11/21/2023   DOE (dyspnea on exertion) 11/20/2023   Port-A-Cath in place 10/31/2023   Skin abnormality 09/30/2023   Genetic testing 09/11/2023   Malignant neoplasm of lower-inner quadrant of left breast in female, estrogen receptor negative (HCC) 08/26/2023   CKD (chronic kidney disease) stage 3, GFR 30-59 ml/min (HCC) 06/16/2023   Muscle cramping 07/09/2022   Weight loss 05/04/2022   Subacute cough 05/04/2022   Vitamin D  deficiency 12/04/2021   Low vitamin B12 level 12/04/2021   Clavicle enlargement 07/23/2018   Osteopenia 06/14/2018   Achilles tendinitis 01/29/2017   Bilateral carotid artery occlusion 01/29/2017   Neuropathy at site of breast surgery 01/28/2017   Prediabetes 07/24/2015   GERD (gastroesophageal reflux disease) 07/20/2015   Vitiligo 07/07/2012   PLANTAR FASCIITIS 06/16/2010   Tobacco dependence due to cigarettes 06/14/2009   Hyperlipidemia 11/10/2008   Angioedema 01/09/2008   Carotid stenosis 06/17/2007   Essential hypertension 06/12/2007   Personal history of malignant neoplasm of breast 02/25/2007   History of peripheral vascular disease 02/21/2007    is allergic to benazepril hcl and yellow dyes (non-tartrazine).  MEDICAL HISTORY: Past Medical History:  Diagnosis Date   Absence of menstruation    amenorrhea   Angioneurotic edema not elsewhere classified    due to ACE-I   Breast cancer (HCC)    Breast disorder    breast cancer 2007   Cancer [facility]) 2007   Left Breast Cancer   Carotid stenosis    Carotid US  (8/15):  RICA 40-59%; LICA 40-59% >>> FU 1 year   Chronic  kidney disease    CKD3   GERD (gastroesophageal reflux disease)    H/O: rheumatic fever    History of breast cancer 07/13/2015   HLD (hyperlipidemia)    Occlusion and stenosis of carotid artery without mention of cerebral infarction    Other abnormal glucose    fasting hyperglycemia   Peripheral vascular disease    Carotid Stenosis   Personal history of malignant neoplasm of breast    Personal history of radiation therapy 2007   Pneumonia    Pre-diabetes    Unspecified essential hypertension     SURGICAL HISTORY: Past Surgical History:  Procedure Laterality Date   APPENDECTOMY  1972   In [place]    BREAST BIOPSY Left 12/06/2005   malignant   BREAST BIOPSY Left 08/20/2023   US  LT BREAST BX W LOC DEV 1ST LESION IMG BX SPEC US  GUIDE 08/20/2023 [facility] MAMMOGRAPHY   BREAST LUMPECTOMY Left 01/08/2006   COLONOSCOPY  2005   negative; Dr Kristie   COLONOSCOPY N/A 09/02/2015   Procedure: COLONOSCOPY;  Surgeon: Margo LITTIE Haddock, MD;  Location: [facility] END[facility] SUITE;  Service: Endoscopy;  Laterality: N/A;  1215-moved to 1130 Ginger to notify pt   PORTACATH PLACEMENT N/A 09/18/2023   Procedure: INSERTION, TUNNELED CENTRAL VENOUS DEVICE, WITH PORT;  Surgeon: Vanderbilt Ned, MD;  Location: [facility] OR;  Service: General;  Laterality: N/A;    SOCIAL HISTORY: Social History   Socioeconomic History  Marital status: Single    Spouse name: Not on file   Number of children: 0   Years of education: Not on file   Highest education level: High school graduate  Occupational History   Not on file  Tobacco Use   Smoking status: Some Days    Current packs/day: 0.50    Average packs/day: 0.5 packs/day for 48.0 years (24.0 ttl pk-yrs)    Types: Cigarettes    Passive exposure: Current   Smokeless tobacco: Never   Tobacco comments:    Smokes daily.    Vaping Use   Vaping status: Never Used  Substance and Sexual Activity   Alcohol use: Yes    Alcohol/week: 1.0 standard drink of alcohol    Types: 1  Standard drinks or equivalent per week    Comment: occasionally   Drug use: No   Sexual activity: Yes    Birth control/protection: Post-menopausal  Other Topics Concern   Not on file  Social History Narrative   Single, no children.   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    Social Drivers of Corporate investment banker Strain: Low Risk  (11/25/2023)   Overall Financial Resource Strain (CARDIA)    Difficulty of Paying Living Expenses: Not very hard  Food Insecurity: No Food Insecurity (11/27/2023)   Hunger Vital Sign    Worried About Running Out of Food in the Last Year: Never true    Ran Out of Food in the Last Year: Never true  Transportation Needs: No Transportation Needs (11/27/2023)   PRAPARE - Administrator, Civil Service (Medical): No    Lack of Transportation (Non-Medical): No  Physical Activity: Sufficiently Active (05/31/2023)   Exercise Vital Sign    Days of Exercise per Week: 5 days    Minutes of Exercise per Session: 60 min  Stress: No Stress Concern Present (05/31/2023)   [employer] of Occupational Health - Occupational Stress Questionnaire    Feeling of Stress : Not at all  Social Connections: Socially Isolated (11/22/2023)   Social Connection and Isolation Panel    Frequency of Communication with Friends and Family: Twice a week    Frequency of Social Gatherings with Friends and Family: Once a week    Attends Religious Services: Never    Database administrator or Organizations: No    Attends Banker Meetings: Never    Marital Status: Never married  Intimate Partner Violence: Not At Risk (11/27/2023)   Humiliation, Afraid, Rape, and Kick questionnaire    Fear of Current or Ex-Partner: No    Emotionally Abused: No    Physically Abused: No    Sexually Abused: No    FAMILY HISTORY: Family History  Problem Relation Age of Onset   Breast cancer Sister 52   Hypertension Other        some of Pts brothers and sisters   Heart  attack Neg Hx    Stroke Neg Hx    Diabetes Neg Hx    Heart disease Neg Hx     Review of Systems  Constitutional:  Negative for appetite change, chills, fatigue, fever and unexpected weight change.  HENT:   Negative for hearing loss, lump/mass and trouble swallowing.   Eyes:  Negative for eye problems and icterus.  Respiratory:  Negative for chest tightness, cough and shortness of breath.   Cardiovascular:  Negative for chest pain, leg swelling and palpitations.  Gastrointestinal:  Negative for abdominal distention, abdominal pain,  constipation, diarrhea, nausea and vomiting.  Endocrine: Negative for hot flashes.  Genitourinary:  Negative for difficulty urinating.   Musculoskeletal:  Negative for arthralgias.  Skin:  Negative for itching and rash.  Neurological:  Negative for dizziness, extremity weakness, headaches and numbness.  Hematological:  Negative for adenopathy. Does not bruise/bleed easily.  Psychiatric/Behavioral:  Negative for depression. The patient is not nervous/anxious.       PHYSICAL EXAMINATION   Onc Performance Status - 01/01/24 1400       KPS SCALE   KPS % SCORE Able to carry on normal activity, minor s/s of disease          Vitals:   01/01/24 1403 01/01/24 1418  BP: (!) 155/56 128/85  Pulse: (!) 57   Resp: 16   Temp: 98.2 F (36.8 C)   SpO2: 97%     She appears well. No acute distress Left breast lower inner quadrant mass noted. Measures about 3 cms.  No regional adenopathy CTA Bilaterally RRR No LE edema  LABORATORY DATA:  CBC    Component Value Date/Time   WBC 8.7 12/03/2023 1239   WBC 9.5 11/26/2023 0605   RBC 3.35 (L) 12/03/2023 1239   HGB 9.5 (L) 12/03/2023 1239   HGB 13.3 06/27/2020 1354   HGB 12.4 05/07/2011 1356   HCT 30.5 (L) 12/03/2023 1239   HCT 40.3 06/27/2020 1354   HCT 37.8 05/07/2011 1356   PLT 344 12/03/2023 1239   PLT 287 06/27/2020 1354   MCV 91.0 12/03/2023 1239   MCV 85 06/27/2020 1354   MCV 84.3 05/07/2011  1356   MCH 28.4 12/03/2023 1239   MCHC 31.1 12/03/2023 1239   RDW 19.5 (H) 12/03/2023 1239   RDW 13.1 06/27/2020 1354   RDW 12.9 05/07/2011 1356   LYMPHSABS 1.8 12/03/2023 1239   LYMPHSABS 4.4 (H) 06/27/2020 1354   LYMPHSABS 5.1 (H) 05/07/2011 1356   MONOABS 0.5 12/03/2023 1239   MONOABS 0.5 05/07/2011 1356   EOSABS 0.3 12/03/2023 1239   EOSABS 0.1 06/27/2020 1354   BASOSABS 0.0 12/03/2023 1239   BASOSABS 0.0 06/27/2020 1354   BASOSABS 0.1 05/07/2011 1356    CMP     Component Value Date/Time   NA 140 12/03/2023 1239   NA 136 06/27/2020 1354   K 4.2 12/03/2023 1239   CL 106 12/03/2023 1239   CO2 26 12/03/2023 1239   GLUCOSE 95 12/03/2023 1239   BUN 29 (H) 12/03/2023 1239   BUN 15 06/27/2020 1354   CREATININE 1.48 (H) 12/03/2023 1239   CALCIUM  9.0 12/03/2023 1239   PROT 6.8 12/03/2023 1239   PROT 7.5 01/31/2021 1027   ALBUMIN 3.6 12/03/2023 1239   ALBUMIN 4.3 01/31/2021 1027   AST 17 12/03/2023 1239   ALT 14 12/03/2023 1239   ALKPHOS 64 12/03/2023 1239   BILITOT 0.4 12/03/2023 1239   GFRNONAA 36 (L) 12/03/2023 1239     ASSESSMENT and THERAPY PLAN:   Assessment and Plan Assessment & Plan Triple negative breast cancer of the left breast, post-neoadjuvant chemotherapy, planned for surgery Completed three out of four chemotherapy treatments.  Tumor partially shrunk on MRI. Scheduled for left simple mastectomy with reconstruction on January 21, 2024. Previous radiation may affect healing. No further chemotherapy or hormone therapy needed post-surgery. Right breast requires regular mammograms. - Proceed with left simple mastectomy and reconstruction on January 21, 2024. - Review pathology reports post-surgery. - Continue regular mammograms of the right breast. - Schedule follow-up appointment two weeks post-surgery to review  reports.  Hypertension Hypertension well-controlled with medication. Previous severe episode during chemotherapy. Currently taking medications  without side effects. - Continue current antihypertensive medications.  All questions were answered. The patient knows to call the clinic with any problems, questions or concerns. We can certainly see the patient much sooner if necessary.  Total encounter time:30 minutes*in face-to-face visit time, chart review, lab review, care coordination, order entry, and documentation of the encounter time.  *Total Encounter Time as defined by the Centers for Medicare and Medicaid Services includes, in addition to the face-to-face time of a patient visit (documented in the note above) non-face-to-face time: obtaining and reviewing outside history, ordering and reviewing medications, tests or procedures, care coordination (communications with other health care professionals or caregivers) and documentation in the medical record.

## 2024-01-15 NOTE — Progress Notes (Signed)
 Surgical Instructions   Your procedure is scheduled on Tuesday, October 14th. Report to [facility] Main Entrance A at 7:30 A.M., then check in with the Admitting office. Any questions or running late day of surgery: call [phone]  Questions prior to your surgery date: call [phone], Monday-Friday, 8am-4pm. If you experience any cold or flu symptoms such as cough, fever, chills, shortness of breath, etc. between now and your scheduled surgery, please notify us  at the above number.     Remember:  Do not eat after midnight the night before your surgery   You may drink clear liquids until 6:30 the morning of your surgery.   Clear liquids allowed are: Water , Non-Citrus Juices (without pulp), Carbonated Beverages, Clear Tea (no milk, honey, etc.), Black Coffee Only (NO MILK, CREAM OR POWDERED CREAMER of any kind), and Gatorade.    Take these medicines the morning of surgery with A SIP OF WATER   amLODipine  (NORVASC )  atorvastatin  (LIPITOR)  buPROPion  (WELLBUTRIN  XL)  chlorthalidone  (HYGROTON )  isosorbide  mononitrate (IMDUR )  labetalol  (NORMODYNE )  omeprazole  (PRILOSEC)    May take these medicines IF NEEDED: acetaminophen  (TYLENOL )  EPINEPHrine  injection    One week prior to surgery, STOP taking any Aspirin  (unless otherwise instructed by your surgeon) Aleve , Naproxen , Ibuprofen , Motrin , Advil , Goody's, BC's, all herbal medications, fish oil, and non-prescription vitamins.                     Do NOT Smoke (Tobacco/Vaping) for 24 hours prior to your procedure.  If you use a CPAP at night, you may bring your mask/headgear for your overnight stay.   You will be asked to remove any contacts, glasses, piercing's, hearing aid's, dentures/partials prior to surgery. Please bring cases for these items if needed.    Patients discharged the day of surgery will not be allowed to drive home, and someone needs to stay with them for 24 hours.  SURGICAL WAITING ROOM  VISITATION Patients may have no more than 2 support people in the waiting area - these visitors may rotate.   Pre-op nurse will coordinate an appropriate time for 1 ADULT support person, who may not rotate, to accompany patient in pre-op.  Children under the age of 90 must have an adult with them who is not the patient and must remain in the main waiting area with an adult.  If the patient needs to stay at the hospital during part of their recovery, the visitor guidelines for inpatient rooms apply.  Please refer to the [facility] website for the visitor guidelines for any additional information.   If you received a COVID test during your pre-op visit  it is requested that you wear a mask when out in public, stay away from anyone that may not be feeling well and notify your surgeon if you develop symptoms. If you have been in contact with anyone that has tested positive in the last 10 days please notify you surgeon.      Pre-operative CHG Bathing Instructions   You can play a key role in reducing the risk of infection after surgery. Your skin needs to be as free of germs as possible. You can reduce the number of germs on your skin by washing with CHG (chlorhexidine  gluconate) soap before surgery. CHG is an antiseptic soap that kills germs and continues to kill germs even after washing.   DO NOT use if you have an allergy to chlorhexidine /CHG or antibacterial soaps. If your skin becomes reddened or irritated, stop  using the CHG and notify one of our RNs at [phone].              TAKE A SHOWER THE NIGHT BEFORE SURGERY   Please keep in mind the following:  DO NOT shave, including legs and underarms, 48 hours prior to surgery.   Place clean sheets on your bed the night before surgery Use a clean washcloth (not used since being washed) for shower. DO NOT sleep with pet's night before surgery.  CHG Shower Instructions:  Wash your face and private area with normal soap. If you choose to  wash your hair, wash first with your normal shampoo.  After you use shampoo/soap, rinse your hair and body thoroughly to remove shampoo/soap residue.  Turn the water  OFF and apply half the bottle of CHG soap to a CLEAN washcloth.  Apply CHG soap ONLY FROM YOUR NECK DOWN TO YOUR TOES (washing for 3-5 minutes)  DO NOT use CHG soap on face, private areas, open wounds, or sores.  Pay special attention to the area where your surgery is being performed.  If you are having back surgery, having someone wash your back for you may be helpful. Wait 2 minutes after CHG soap is applied, then you may rinse off the CHG soap.  Pat dry with a clean towel  Put on clean pajamas    Additional instructions for the day of surgery: If you choose, you may shower the morning of surgery with an antibacterial soap.  DO NOT APPLY any lotions, deodorants, cologne, powders, or perfumes.   Do not wear jewelry or makeup Do not wear nail polish, gel polish, artificial nails, or any other type of covering on natural nails (fingers and toes) Do not bring valuables to the hospital. [facility] is not responsible for valuables/personal belongings. Put on clean/comfortable clothes.  Please brush your teeth.  Ask your nurse before applying any prescription medications to the skin.

## 2024-01-16 ENCOUNTER — Other Ambulatory Visit: Payer: Self-pay

## 2024-01-16 ENCOUNTER — Encounter (HOSPITAL_COMMUNITY): Payer: Self-pay

## 2024-01-16 ENCOUNTER — Encounter (HOSPITAL_COMMUNITY)
Admission: RE | Admit: 2024-01-16 | Discharge: 2024-01-16 | Disposition: A | Discharge status: Home / Self Care | Source: Ambulatory Visit | Attending: Surgery | Admitting: Surgery

## 2024-01-16 VITALS — BP 145/66 | HR 51 | Temp 97.7°F | Resp 16 | Ht 64.0 in | Wt 151.4 lb

## 2024-01-16 DIAGNOSIS — Z7969 Long term (current) use of other immunomodulators and immunosuppressants: Secondary | ICD-10-CM | POA: Insufficient documentation

## 2024-01-16 DIAGNOSIS — Z01818 Encounter for other preprocedural examination: Secondary | ICD-10-CM

## 2024-01-16 DIAGNOSIS — I130 Hypertensive heart and chronic kidney disease with heart failure and stage 1 through stage 4 chronic kidney disease, or unspecified chronic kidney disease: Secondary | ICD-10-CM | POA: Insufficient documentation

## 2024-01-16 DIAGNOSIS — Z853 Personal history of malignant neoplasm of breast: Secondary | ICD-10-CM | POA: Insufficient documentation

## 2024-01-16 DIAGNOSIS — I739 Peripheral vascular disease, unspecified: Secondary | ICD-10-CM | POA: Insufficient documentation

## 2024-01-16 DIAGNOSIS — I5030 Unspecified diastolic (congestive) heart failure: Secondary | ICD-10-CM | POA: Diagnosis not present

## 2024-01-16 DIAGNOSIS — R42 Dizziness and giddiness: Secondary | ICD-10-CM | POA: Diagnosis not present

## 2024-01-16 DIAGNOSIS — C50912 Malignant neoplasm of unspecified site of left female breast: Secondary | ICD-10-CM | POA: Insufficient documentation

## 2024-01-16 DIAGNOSIS — N1831 Chronic kidney disease, stage 3a: Secondary | ICD-10-CM | POA: Diagnosis not present

## 2024-01-16 DIAGNOSIS — Z01812 Encounter for preprocedural laboratory examination: Secondary | ICD-10-CM | POA: Diagnosis not present

## 2024-01-16 DIAGNOSIS — D631 Anemia in chronic kidney disease: Secondary | ICD-10-CM | POA: Insufficient documentation

## 2024-01-16 DIAGNOSIS — Z923 Personal history of irradiation: Secondary | ICD-10-CM | POA: Insufficient documentation

## 2024-01-16 DIAGNOSIS — I6523 Occlusion and stenosis of bilateral carotid arteries: Secondary | ICD-10-CM | POA: Diagnosis not present

## 2024-01-16 LAB — BASIC METABOLIC PANEL WITH GFR
Anion gap: 12 (ref 5–15)
BUN: 35 mg/dL — ABNORMAL HIGH (ref 8–23)
CO2: 20 mmol/L — ABNORMAL LOW (ref 22–32)
Calcium: 9.2 mg/dL (ref 8.9–10.3)
Chloride: 104 mmol/L (ref 98–111)
Creatinine, Ser: 1.57 mg/dL — ABNORMAL HIGH (ref 0.44–1.00)
GFR, Estimated: 34 mL/min — ABNORMAL LOW (ref >60)
Glucose, Bld: 106 mg/dL — ABNORMAL HIGH (ref 70–99)
Potassium: 4.2 mmol/L (ref 3.5–5.1)
Sodium: 136 mmol/L (ref 135–145)

## 2024-01-16 LAB — CBC
HCT: 35.6 % — ABNORMAL LOW (ref 36.0–46.0)
Hemoglobin: 11.2 g/dL — ABNORMAL LOW (ref 12.0–15.0)
MCH: 28.5 pg (ref 26.0–34.0)
MCHC: 31.5 g/dL (ref 30.0–36.0)
MCV: 90.6 fL (ref 80.0–100.0)
Platelets: 211 K/uL (ref 150–400)
RBC: 3.93 MIL/uL (ref 3.87–5.11)
RDW: 13.9 % (ref 11.5–15.5)
WBC: 5.3 K/uL (ref 4.0–10.5)
nRBC: 0 % (ref 0.0–0.2)

## 2024-01-16 NOTE — Progress Notes (Addendum)
 This RN contacted Dr. Milta scheduler to have them verify the correct consent that needs to be signed by the patient.  Will need to have patient sign on the day of surgery.  PCP - Dr. Glade Hope Cardiologist - Dr. Ozell Fell MD LOV 12-23-23 w/follow up in November  PPM/ICD - Denies Device Orders - n/a Rep Notified - n/a  Chest x-ray - n/a EKG - 12-23-23 Stress Test - Denies ECHO - 11-21-23 Cardiac Cath - Denies  Sleep Study - Denies CPAP - n/a  NON-diabetic  Last dose of GLP1 agonist-  Denies GLP1 instructions: n/a  Blood Thinner Instructions: Denies Aspirin  Instructions: Denies  ERAS Protcol - Clears until 0630 PRE-SURGERY Ensure or G2- none  COVID TEST- n/a   Anesthesia review: Yes, HTN, carotid stenosis, CKD  Patient denies shortness of breath, fever, cough and chest pain at PAT appointment. Patient denies any respiratory issues at this time.    All instructions explained to the patient, with a verbal understanding of the material. Patient agrees to go over the instructions while at home for a better understanding. Patient also instructed to self quarantine after being tested for COVID-19. The opportunity to ask questions was provided.

## 2024-01-17 ENCOUNTER — Other Ambulatory Visit: Payer: Self-pay | Admitting: Surgery

## 2024-01-17 ENCOUNTER — Ambulatory Visit: Payer: Self-pay | Admitting: Surgery

## 2024-01-20 NOTE — H&P (Signed)
 History of Present Illness: [name] is a 78 y.o. female who is seen today for recheck. She was scheduled for bilateral mastectomy with reconstruction after neoadjuvant chemotherapy for left breast cancer. She had questions about it and went to rethink her options. She comes in today after seeing plastic surgery to review what she discussed with the plastic surgeon and to rereview surgery with me. She has no complaints..    Review of Systems: A complete review of systems was obtained from the patient. I have reviewed this information and discussed as appropriate with the patient. See HPI as well for other ROS.    Medical History: Past Medical History:  Diagnosis Date  History of cancer   There is no problem list on file for this patient.  Past Surgical History:  Procedure Laterality Date  MASTECTOMY PARTIAL / LUMPECTOMY  01/09/2016    Allergies  Allergen Reactions  Benazepril Hcl Unknown  REACTION: FACE SWELLING (ANGIOEDEMA); she can not take ARBS !!! Because of a history of documented adverse serious drug reaction;Medi Alert bracelet is recommended  Peanut Unknown  03/13/13 hivesw/o facial swelling  Shellfish Containing Products Unknown  03/15/13 hives & facial swelling  Yellow Dye Unknown  12/14 hives & facial swelling with Eleanore Puller Aid   Current Outpatient Medications on File Prior to Visit  Medication Sig Dispense Refill  amLODIPine  (NORVASC ) 10 MG tablet  aspirin  81 MG chewable tablet Take 81 mg by mouth once daily  atorvastatin  (LIPITOR) 40 MG tablet Take 40 mg by mouth once daily  buPROPion  (WELLBUTRIN  XL) 150 MG XL tablet TAKE 1 TABLET (150 MG TOTAL) BY MOUTH IN THE MORNING DAILY.  chlorthalidone  25 MG tablet Take 25 mg by mouth once daily  EPINEPHrine  (EPIPEN ) 0.3 mg/0.3 mL auto-injector Inject 0.3 mg into the muscle as needed for Anaphylaxis  ergocalciferol , vitamin D2, 1,250 mcg (50,000 unit) capsule Take 50,000 Units by mouth every 7 (seven) days  FUROsemide   (LASIX ) 40 MG tablet Take 40 mg by mouth once daily  gabapentin  (NEURONTIN ) 300 MG capsule Take 1 capsule by mouth at bedtime  hydrALAZINE  (APRESOLINE ) 50 MG tablet  hydroCHLOROthiazide  (HYDRODIURIL ) 25 MG tablet Take 1 tablet by mouth once daily  isosorbide  mononitrate (IMDUR ) 30 MG ER tablet Take 30 mg by mouth once daily  labetaloL  (TRANDATE ) 300 MG tablet Take 1 tablet by mouth 2 (two) times daily  omeprazole  (PRILOSEC) 40 MG DR capsule Take 1 capsule by mouth once daily   No current facility-administered medications on file prior to visit.   Family History  Problem Relation Age of Onset  Breast cancer Sister    Social History   Tobacco Use  Smoking Status Every Day  Types: Cigarettes  Smokeless Tobacco Not on file    Social History   Socioeconomic History  Marital status: Single  Tobacco Use  Smoking status: Every Day  Types: Cigarettes  Substance and Sexual Activity  Alcohol use: Not Currently  Drug use: Not Currently   Social Drivers of Health   Financial Resource Strain: Low Risk (11/25/2023)  Received from [facility]  Overall Financial Resource Strain (CARDIA)  How hard is it for you to pay for the very basics like food, housing, medical care, and heating?: Not very hard  Food Insecurity: No Food Insecurity (11/27/2023)  Received from [facility]  Hunger Vital Sign  Within the past 12 months, you worried that your food would run out before you got the money to buy more.: Never true  Within the past 12  months, the food you bought just didn't last and you didn't have money to get more.: Never true  Transportation Needs: No Transportation Needs (11/27/2023)  Received from [facility] - Transportation  In the past 12 months, has lack of transportation kept you from medical appointments or from getting medications?: No  In the past 12 months, has lack of transportation kept you from meetings, work, or from getting things needed for daily living?: No   Physical Activity: Sufficiently Active (05/31/2023)  Received from [facility]  Exercise Vital Sign  On average, how many days per week do you engage in moderate to strenuous exercise (like a brisk walk)?: 5 days  On average, how many minutes do you engage in exercise at this level?: 60 min  Stress: No Stress Concern Present (05/31/2023)  Received from [facility] - Occupational Stress Questionnaire  Feeling of Stress : Not at all  Social Connections: Socially Isolated (11/22/2023)  Received from [facility]  Social Connection and Isolation Panel  In a typical week, how many times do you talk on the phone with family, friends, or neighbors?: Twice a week  How often do you get together with friends or relatives?: Once a week  How often do you attend church or religious services?: Never  Do you belong to any clubs or organizations such as church groups, unions, fraternal or athletic groups, or school groups?: No  How often do you attend meetings of the clubs or organizations you belong to?: Never  Are you married, widowed, divorced, separated, never married, or living with a partner?: Never married  Housing Stability: Unknown (12/13/2023)  Housing Stability Vital Sign  Homeless in the Last Year: No   Objective:   Vitals:  12/27/23 1000  PainSc: 0-No pain   There is no height or weight on file to calculate BMI.  Physical Exam Skin: General: Skin is warm.  Neurological:  General: No focal deficit present.  Mental Status: She is alert.  Psychiatric:  Mood and Affect: Mood normal.     Breast exam not repeated  Assessment and Plan:   Diagnoses and all orders for this visit:  Recurrent breast cancer, left (CMS/HHS-HCC)   Reviewed options. Her best options left simple mastectomy with sentinel lymph node mapping. She is over 54 and there is data to support repeat lumpectomy but given the aggressiveness of her cancer I do not think that is  the best option especially after neoadjuvant chemotherapy. She did have a breast lift down the road and a reconstruction later but she has to quit smoking. We discussed all of her options once again and she has chosen just to do a left simple mastectomy for now and forego right risk-reducing mastectomy.  No follow-ups on file.  DEBBY CURTISTINE SHIPPER, MD

## 2024-01-20 NOTE — Progress Notes (Signed)
 Anesthesia Chart Review:  78 year old female current smoker with pertinent history including left breast cancer on chemo since 05/25 (initially had left breast cancer 2007 treated with surgery and adjuvant radiation), CKD IIIa, carotid artery stenosis (1 to 39% bilateral by ultrasound 11/2021), PAD, vertigo, HTN, recent diagnosis of HFpEF.   She was admitted in 08/25 with volume overload in setting of hypertensive emergency. Echo with EF 60-65%, grade II DD, RV okay. Course complicated by AKI. Nephrology consulted and suspected contrast-induced nephropathy. She also had acute on chronic anemia requiring transfusion and IV iron .  She had posthospital follow-up with cardiology on 11/29/2023.  She was noted to be euvolemic.  Blood pressure still uncontrolled at 170/70.  She was continued on labetalol  400 mg twice daily, hydralazine  100 mg 3 times daily, amlodipine  10 mg daily, Lasix  40 mg daily.  Spironolactone  was stopped during recent admission due to AKI.  She was seen again in cardiology follow-up with Reche Finder, NP on 12/23/2023 for preop evaluation.  Blood pressure still elevated, 150/54.  Imdur  30 mg daily was added to her current regimen.  Per note, According to the Revised Cardiac Risk Index (RCRI), her Perioperative Risk of Major Cardiac Event is (%): 6.6. Her Functional Capacity in METs is: 5.07 according to the Duke Activity Status Index (DASI). Per AHA/ACC guidelines, she is deemed acceptable risk for the planned procedure without additional cardiovascular testing. Will route to surgical team so they are aware.  BMP and CBC 01/16/2024 reviewed, creatinine elevated 1.57 consistent with history of CKD, mild anemia hemoglobin 11.2, otherwise unremarkable.  EKG 12/23/2023: Sinus bradycardia.  Rate 48.  TTE 11/21/2023: 1. Left ventricular ejection fraction, by estimation, is 60 to 65%. The  left ventricle has normal function. The left ventricle has no regional  wall motion abnormalities. Left  ventricular diastolic parameters are  consistent with Grade II diastolic  dysfunction (pseudonormalization).   2. Right ventricular systolic function is normal. The right ventricular  size is normal. Tricuspid regurgitation signal is inadequate for assessing  PA pressure.   3. Left atrial size was severely dilated.   4. Right atrial size was mildly dilated.   5. The mitral valve is grossly normal. Trivial mitral valve  regurgitation. No evidence of mitral stenosis.   6. The aortic valve is tricuspid. Aortic valve regurgitation is not  visualized. No aortic stenosis is present.   7. The inferior vena cava is normal in size with greater than 50%  respiratory variability, suggesting right atrial pressure of 3 mmHg.   Comparison(s): No significant change from prior study.       Lynwood Geofm RIGGERS [facility] Short Stay Center/Anesthesiology Phone [phone] 01/20/2024 8:50 AM

## 2024-01-20 NOTE — Anesthesia Preprocedure Evaluation (Addendum)
 Anesthesia Evaluation  Patient identified by MRN, date of birth, ID band Patient awake    Reviewed: Allergy & Precautions, H&P , NPO status , Patient's Chart, lab work & pertinent test results  Airway Mallampati: II  TM Distance: >3 FB Neck ROM: Full    Dental no notable dental hx. (+) Teeth Intact, Dental Advisory Given, Missing, Poor Dentition   Pulmonary neg pulmonary ROS, pneumonia, Current Smoker   Pulmonary exam normal breath sounds clear to auscultation       Cardiovascular Exercise Tolerance: Good hypertension, Pt. on medications + Peripheral Vascular Disease, +CHF and + DOE  negative cardio ROS Normal cardiovascular exam Rhythm:Regular Rate:Normal     Neuro/Psych  PSYCHIATRIC DISORDERS Anxiety     negative neurological ROS  negative psych ROS   GI/Hepatic negative GI ROS, Neg liver ROS,GERD  Medicated,,  Endo/Other  negative endocrine ROS    Renal/GU Renal diseasenegative Renal ROS  negative genitourinary   Musculoskeletal negative musculoskeletal ROS (+)    Abdominal   Peds negative pediatric ROS (+)  Hematology negative hematology ROS (+)   Anesthesia Other Findings   Reproductive/Obstetrics negative OB ROS                              Anesthesia Physical Anesthesia Plan  ASA: 3  Anesthesia Plan: General and Regional   Post-op Pain Management: Tylenol  PO (pre-op)*, Regional block* and Precedex   Induction: Intravenous  PONV Risk Score and Plan: 2 and Ondansetron , Dexamethasone , Treatment may vary due to age or medical condition and Midazolam   Airway Management Planned: LMA  Additional Equipment: None  Intra-op Plan:   Post-operative Plan:   Informed Consent: I have reviewed the patients History and Physical, chart, labs and discussed the procedure including the risks, benefits and alternatives for the proposed anesthesia with the patient or authorized  representative who has indicated his/her understanding and acceptance.       Plan Discussed with: Anesthesiologist and CRNA  Anesthesia Plan Comments: (PAT note by Lynwood Hope, PA-C: 78 year old female current smoker with pertinent history including leftbreast cancer on chemo since 05/25 (initially had left breast cancer 2007 treated with surgery and adjuvant radiation), CKD IIIa, carotid artery stenosis (1 to 39% bilateral by ultrasound 11/2021), PAD, vertigo, HTN, recent diagnosis of HFpEF.  She was admitted in 08/25 with volume overload in setting of hypertensive emergency. Echo with EF 60-65%, grade II DD, RV okay. Course complicated by AKI. Nephrology consulted and suspected contrast-induced nephropathy. She also had acute on chronic anemia requiring transfusion and IV iron .  She had posthospital follow-up with cardiology on 11/29/2023.  She was noted to be euvolemic.  Blood pressure still uncontrolled at 170/70.  She was continued on labetalol  400 mg twice daily, hydralazine  100 mg 3 times daily, amlodipine  10 mg daily, Lasix  40 mg daily.  Spironolactone  was stopped during recent admission due to AKI.  She was seen again in cardiology follow-up with Reche Finder, NP on 12/23/2023 for preop evaluation.  Blood pressure still elevated, 150/54.  Imdur  30 mg daily was added to her current regimen.  Per note, According to the Revised Cardiac Risk Index (RCRI), her Perioperative Risk of Major Cardiac Event is (%): 6.6. Her Functional Capacity in METs is: 5.07 according to the Duke Activity Status Index (DASI). Per AHA/ACC guidelines, she is deemed acceptable risk for the planned procedure without additional cardiovascular testing. Will route to surgical team so they are aware.  BMP and  CBC 01/16/2024 reviewed, creatinine elevated 1.57 consistent with history of CKD, mild anemia hemoglobin 11.2, otherwise unremarkable.  EKG 12/23/2023: Sinus bradycardia.  Rate 48.  TTE 11/21/2023: 1. Left ventricular  ejection fraction, by estimation, is 60 to 65%. The  left ventricle has normal function. The left ventricle has no regional  wall motion abnormalities. Left ventricular diastolic parameters are  consistent with Grade II diastolic  dysfunction (pseudonormalization).  2. Right ventricular systolic function is normal. The right ventricular  size is normal. Tricuspid regurgitation signal is inadequate for assessing  PA pressure.  3. Left atrial size was severely dilated.  4. Right atrial size was mildly dilated.  5. The mitral valve is grossly normal. Trivial mitral valve  regurgitation. No evidence of mitral stenosis.  6. The aortic valve is tricuspid. Aortic valve regurgitation is not  visualized. No aortic stenosis is present.  7. The inferior vena cava is normal in size with greater than 50%  respiratory variability, suggesting right atrial pressure of 3 mmHg.   Comparison(s): No significant change from prior study.    )         Anesthesia Quick Evaluation

## 2024-01-21 ENCOUNTER — Ambulatory Visit (HOSPITAL_COMMUNITY): Payer: Self-pay | Admitting: Vascular Surgery

## 2024-01-21 ENCOUNTER — Encounter (HOSPITAL_COMMUNITY)
Admission: RE | Disposition: A | Discharge status: Home / Self Care | Payer: Self-pay | Source: Home / Self Care | Attending: Surgery

## 2024-01-21 ENCOUNTER — Encounter (HOSPITAL_COMMUNITY): Payer: Self-pay | Admitting: Surgery

## 2024-01-21 ENCOUNTER — Other Ambulatory Visit: Payer: Self-pay

## 2024-01-21 ENCOUNTER — Ambulatory Visit (HOSPITAL_BASED_OUTPATIENT_CLINIC_OR_DEPARTMENT_OTHER): Payer: Self-pay | Admitting: Anesthesiology

## 2024-01-21 ENCOUNTER — Observation Stay (HOSPITAL_COMMUNITY)
Admission: RE | Admit: 2024-01-21 | Discharge: 2024-01-22 | Disposition: A | Discharge status: Home / Self Care | Attending: Surgery | Admitting: Surgery

## 2024-01-21 DIAGNOSIS — C50312 Malignant neoplasm of lower-inner quadrant of left female breast: Principal | ICD-10-CM | POA: Insufficient documentation

## 2024-01-21 DIAGNOSIS — C50912 Malignant neoplasm of unspecified site of left female breast: Secondary | ICD-10-CM | POA: Diagnosis not present

## 2024-01-21 DIAGNOSIS — I130 Hypertensive heart and chronic kidney disease with heart failure and stage 1 through stage 4 chronic kidney disease, or unspecified chronic kidney disease: Secondary | ICD-10-CM

## 2024-01-21 DIAGNOSIS — Z79899 Other long term (current) drug therapy: Secondary | ICD-10-CM | POA: Diagnosis not present

## 2024-01-21 DIAGNOSIS — I10 Essential (primary) hypertension: Secondary | ICD-10-CM | POA: Diagnosis not present

## 2024-01-21 DIAGNOSIS — R238 Other skin changes: Secondary | ICD-10-CM | POA: Diagnosis not present

## 2024-01-21 DIAGNOSIS — Z1722 Progesterone receptor negative status: Secondary | ICD-10-CM | POA: Diagnosis not present

## 2024-01-21 DIAGNOSIS — I110 Hypertensive heart disease with heart failure: Secondary | ICD-10-CM | POA: Diagnosis not present

## 2024-01-21 DIAGNOSIS — Z171 Estrogen receptor negative status [ER-]: Secondary | ICD-10-CM | POA: Insufficient documentation

## 2024-01-21 DIAGNOSIS — I5032 Chronic diastolic (congestive) heart failure: Secondary | ICD-10-CM

## 2024-01-21 DIAGNOSIS — G8918 Other acute postprocedural pain: Secondary | ICD-10-CM | POA: Diagnosis not present

## 2024-01-21 DIAGNOSIS — N183 Chronic kidney disease, stage 3 unspecified: Secondary | ICD-10-CM | POA: Diagnosis not present

## 2024-01-21 DIAGNOSIS — Z1732 Human epidermal growth factor receptor 2 negative status: Secondary | ICD-10-CM | POA: Diagnosis not present

## 2024-01-21 DIAGNOSIS — I509 Heart failure, unspecified: Secondary | ICD-10-CM | POA: Insufficient documentation

## 2024-01-21 DIAGNOSIS — F17210 Nicotine dependence, cigarettes, uncomplicated: Secondary | ICD-10-CM | POA: Diagnosis not present

## 2024-01-21 HISTORY — PX: MASTECTOMY W/ SENTINEL NODE BIOPSY: SHX2001

## 2024-01-21 SURGERY — MASTECTOMY WITH SENTINEL LYMPH NODE BIOPSY
Anesthesia: Regional | Site: Breast | Laterality: Left

## 2024-01-21 MED ORDER — CHLORHEXIDINE GLUCONATE CLOTH 2 % EX PADS: 6.0000 | MEDICATED_PAD | Freq: Once | CUTANEOUS | Status: DC

## 2024-01-21 MED ORDER — AMLODIPINE BESYLATE 10 MG PO TABS
10.0000 mg | ORAL_TABLET | Freq: Every day | ORAL | Status: DC
  Filled 2024-01-21: qty 1

## 2024-01-21 MED ORDER — PROPOFOL 10 MG/ML IV BOLUS
INTRAVENOUS | Status: AC
Start: 2024-01-21 — End: 2024-01-21
  Filled 2024-01-21: qty 20

## 2024-01-21 MED ORDER — ONDANSETRON HCL 4 MG/2ML IJ SOLN
INTRAMUSCULAR | Status: DC | PRN
  Administered 2024-01-21: 4 mg via INTRAVENOUS

## 2024-01-21 MED ORDER — PROPOFOL 10 MG/ML IV BOLUS
INTRAVENOUS | Status: DC | PRN
  Administered 2024-01-21: 200 mg via INTRAVENOUS

## 2024-01-21 MED ORDER — PHENYLEPHRINE 80 MCG/ML (10ML) SYRINGE FOR IV PUSH (FOR BLOOD PRESSURE SUPPORT)
PREFILLED_SYRINGE | INTRAVENOUS | Status: DC | PRN
  Administered 2024-01-21: 160 ug via INTRAVENOUS
  Administered 2024-01-21: 80 ug via INTRAVENOUS
  Administered 2024-01-21: 160 ug via INTRAVENOUS

## 2024-01-21 MED ORDER — ISOSORBIDE MONONITRATE ER 30 MG PO TB24
30.0000 mg | ORAL_TABLET | Freq: Once | ORAL | Status: AC
  Administered 2024-01-21: 30 mg via ORAL
  Filled 2024-01-21: qty 1

## 2024-01-21 MED ORDER — METHOCARBAMOL 500 MG PO TABS: 500.0000 mg | ORAL_TABLET | Freq: Four times a day (QID) | ORAL | Status: DC | PRN

## 2024-01-21 MED ORDER — MEPERIDINE HCL 25 MG/ML IJ SOLN: 6.2500 mg | INTRAMUSCULAR | Status: DC | PRN

## 2024-01-21 MED ORDER — ISOSORBIDE MONONITRATE ER 30 MG PO TB24
30.0000 mg | ORAL_TABLET | Freq: Every day | ORAL | Status: DC
  Administered 2024-01-22: 30 mg via ORAL
  Filled 2024-01-21: qty 1

## 2024-01-21 MED ORDER — ONDANSETRON HCL 4 MG/2ML IJ SOLN: 4.0000 mg | Freq: Four times a day (QID) | INTRAMUSCULAR | Status: DC | PRN

## 2024-01-21 MED ORDER — CHLORHEXIDINE GLUCONATE CLOTH 2 % EX PADS: 6.0000 | MEDICATED_PAD | Freq: Once | CUTANEOUS | Status: AC

## 2024-01-21 MED ORDER — MIDAZOLAM HCL 2 MG/2ML IJ SOLN
INTRAMUSCULAR | Status: AC
  Filled 2024-01-21: qty 2

## 2024-01-21 MED ORDER — CHLORHEXIDINE GLUCONATE 0.12 % MT SOLN
15.0000 mL | Freq: Once | OROMUCOSAL | Status: AC
  Administered 2024-01-21: 15 mL via OROMUCOSAL
  Filled 2024-01-21: qty 15

## 2024-01-21 MED ORDER — FENTANYL CITRATE (PF) 250 MCG/5ML IJ SOLN
INTRAMUSCULAR | Status: DC | PRN
  Administered 2024-01-21: 50 ug via INTRAVENOUS
  Administered 2024-01-21: 100 ug via INTRAVENOUS
  Administered 2024-01-21 (×2): 50 ug via INTRAVENOUS

## 2024-01-21 MED ORDER — DEXAMETHASONE SOD PHOSPHATE PF 10 MG/ML IJ SOLN
INTRAMUSCULAR | Status: DC | PRN
  Administered 2024-01-21: 4 mg via INTRAVENOUS

## 2024-01-21 MED ORDER — MAGTRACE LYMPHATIC TRACER
INTRAMUSCULAR | Status: DC | PRN
  Administered 2024-01-21: 2 mL via INTRAMUSCULAR

## 2024-01-21 MED ORDER — ALBUMIN HUMAN 5 % IV SOLN: INTRAVENOUS | Status: DC | PRN

## 2024-01-21 MED ORDER — DIPHENHYDRAMINE HCL 25 MG PO CAPS: 25.0000 mg | ORAL_CAPSULE | Freq: Four times a day (QID) | ORAL | Status: DC | PRN

## 2024-01-21 MED ORDER — FENTANYL CITRATE (PF) 100 MCG/2ML IJ SOLN
25.0000 ug | INTRAMUSCULAR | Status: DC | PRN
  Administered 2024-01-21: 25 ug via INTRAVENOUS

## 2024-01-21 MED ORDER — FENTANYL CITRATE (PF) 250 MCG/5ML IJ SOLN
INTRAMUSCULAR | Status: AC
  Filled 2024-01-21: qty 5

## 2024-01-21 MED ORDER — METHYLENE BLUE 20 MG/2ML IV SOSY
PREFILLED_SYRINGE | INTRAVENOUS | Status: AC
  Filled 2024-01-21: qty 2

## 2024-01-21 MED ORDER — ORAL CARE MOUTH RINSE: 15.0000 mL | Freq: Once | OROMUCOSAL | Status: AC

## 2024-01-21 MED ORDER — LIDOCAINE 2% (20 MG/ML) 5 ML SYRINGE
INTRAMUSCULAR | Status: DC | PRN
  Administered 2024-01-21: 100 mg via INTRAVENOUS

## 2024-01-21 MED ORDER — LABETALOL HCL 200 MG PO TABS
400.0000 mg | ORAL_TABLET | Freq: Two times a day (BID) | ORAL | Status: DC
  Administered 2024-01-21 – 2024-01-22 (×3): 400 mg via ORAL
  Filled 2024-01-21 (×3): qty 2

## 2024-01-21 MED ORDER — HYDROMORPHONE HCL 1 MG/ML IJ SOLN: 1.0000 mg | INTRAMUSCULAR | Status: DC | PRN

## 2024-01-21 MED ORDER — DEXTROSE-SODIUM CHLORIDE 5-0.9 % IV SOLN: INTRAVENOUS | Status: DC

## 2024-01-21 MED ORDER — METOPROLOL TARTRATE 5 MG/5ML IV SOLN: 5.0000 mg | Freq: Four times a day (QID) | INTRAVENOUS | Status: DC | PRN

## 2024-01-21 MED ORDER — OXYCODONE HCL 5 MG/5ML PO SOLN: 5.0000 mg | Freq: Once | ORAL | Status: DC | PRN

## 2024-01-21 MED ORDER — SODIUM CHLORIDE (PF) 0.9 % IJ SOLN
PREFILLED_SYRINGE | INTRAVENOUS | Status: DC | PRN
  Administered 2024-01-21: 5 mL via INTRAMUSCULAR

## 2024-01-21 MED ORDER — TRANEXAMIC ACID 1000 MG/10ML IV SOLN
2000.0000 mg | Freq: Once | INTRAVENOUS | Status: DC
  Filled 2024-01-21: qty 20

## 2024-01-21 MED ORDER — ACETAMINOPHEN 500 MG PO TABS: 1000.0000 mg | ORAL_TABLET | Freq: Two times a day (BID) | ORAL | Status: DC | PRN

## 2024-01-21 MED ORDER — AMLODIPINE BESYLATE 10 MG PO TABS
10.0000 mg | ORAL_TABLET | Freq: Once | ORAL | Status: AC
  Administered 2024-01-21: 10 mg via ORAL
  Filled 2024-01-21: qty 1

## 2024-01-21 MED ORDER — HYDRALAZINE HCL 25 MG PO TABS
25.0000 mg | ORAL_TABLET | Freq: Once | ORAL | Status: AC
  Administered 2024-01-21: 25 mg via ORAL
  Filled 2024-01-21: qty 1

## 2024-01-21 MED ORDER — GLYCOPYRROLATE PF 0.2 MG/ML IJ SOSY
PREFILLED_SYRINGE | INTRAMUSCULAR | Status: DC | PRN
Start: 2024-01-21 — End: 2024-01-21
  Administered 2024-01-21 (×2): .2 mg via INTRAVENOUS

## 2024-01-21 MED ORDER — ATORVASTATIN CALCIUM 40 MG PO TABS
40.0000 mg | ORAL_TABLET | Freq: Every day | ORAL | Status: DC
  Administered 2024-01-21 – 2024-01-22 (×2): 40 mg via ORAL
  Filled 2024-01-21 (×2): qty 1

## 2024-01-21 MED ORDER — SODIUM CHLORIDE (PF) 0.9 % IJ SOLN
INTRAMUSCULAR | Status: AC
  Filled 2024-01-21: qty 10

## 2024-01-21 MED ORDER — ENOXAPARIN SODIUM 40 MG/0.4ML IJ SOSY: 40.0000 mg | PREFILLED_SYRINGE | INTRAMUSCULAR | Status: DC

## 2024-01-21 MED ORDER — FENTANYL CITRATE (PF) 100 MCG/2ML IJ SOLN
INTRAMUSCULAR | Status: AC
  Filled 2024-01-21: qty 2

## 2024-01-21 MED ORDER — CEFAZOLIN SODIUM-DEXTROSE 2-4 GM/100ML-% IV SOLN: 2.0000 g | INTRAVENOUS | Status: DC

## 2024-01-21 MED ORDER — LABETALOL HCL 200 MG PO TABS
200.0000 mg | ORAL_TABLET | Freq: Once | ORAL | Status: AC
  Administered 2024-01-21: 200 mg via ORAL
  Filled 2024-01-21: qty 1

## 2024-01-21 MED ORDER — PANTOPRAZOLE SODIUM 40 MG PO TBEC
40.0000 mg | DELAYED_RELEASE_TABLET | Freq: Every day | ORAL | Status: DC
  Administered 2024-01-21 – 2024-01-22 (×2): 40 mg via ORAL
  Filled 2024-01-21 (×2): qty 1

## 2024-01-21 MED ORDER — CHLORHEXIDINE GLUCONATE CLOTH 2 % EX PADS
6.0000 | MEDICATED_PAD | Freq: Once | CUTANEOUS | Status: AC
  Administered 2024-01-21: 6 via TOPICAL

## 2024-01-21 MED ORDER — PHENYLEPHRINE HCL-NACL 20-0.9 MG/250ML-% IV SOLN
INTRAVENOUS | Status: DC | PRN
  Administered 2024-01-21: 50 ug/min via INTRAVENOUS

## 2024-01-21 MED ORDER — TRANEXAMIC ACID 1000 MG/10ML IV SOLN: 2000.0000 mg | Freq: Once | INTRAVENOUS | Status: DC

## 2024-01-21 MED ORDER — OXYCODONE HCL 5 MG PO TABS: 5.0000 mg | ORAL_TABLET | Freq: Once | ORAL | Status: DC | PRN

## 2024-01-21 MED ORDER — ONDANSETRON 4 MG PO TBDP: 4.0000 mg | ORAL_TABLET | Freq: Four times a day (QID) | ORAL | Status: DC | PRN

## 2024-01-21 MED ORDER — CEFAZOLIN SODIUM-DEXTROSE 2-4 GM/100ML-% IV SOLN
2.0000 g | Freq: Three times a day (TID) | INTRAVENOUS | Status: AC
  Administered 2024-01-21: 2 g via INTRAVENOUS
  Filled 2024-01-21: qty 100

## 2024-01-21 MED ORDER — CHLORTHALIDONE 25 MG PO TABS
25.0000 mg | ORAL_TABLET | Freq: Every day | ORAL | Status: DC
  Administered 2024-01-21 – 2024-01-22 (×2): 25 mg via ORAL
  Filled 2024-01-21 (×2): qty 1

## 2024-01-21 MED ORDER — LACTATED RINGERS IV SOLN: INTRAVENOUS | Status: DC

## 2024-01-21 MED ORDER — EPHEDRINE SULFATE-NACL 50-0.9 MG/10ML-% IV SOSY
PREFILLED_SYRINGE | INTRAVENOUS | Status: DC | PRN
  Administered 2024-01-21: 10 mg via INTRAVENOUS
  Administered 2024-01-21: 15 mg via INTRAVENOUS

## 2024-01-21 MED ORDER — BUPROPION HCL ER (XL) 150 MG PO TB24
300.0000 mg | ORAL_TABLET | Freq: Every day | ORAL | Status: DC
  Administered 2024-01-21 – 2024-01-22 (×2): 300 mg via ORAL
  Filled 2024-01-21 (×2): qty 2

## 2024-01-21 MED ORDER — OXYCODONE-ACETAMINOPHEN 5-325 MG PO TABS
1.0000 | ORAL_TABLET | ORAL | Status: DC | PRN
  Administered 2024-01-21: 2 via ORAL
  Filled 2024-01-21: qty 2

## 2024-01-21 MED ORDER — BUPIVACAINE HCL (PF) 0.25 % IJ SOLN
INTRAMUSCULAR | Status: DC | PRN
  Administered 2024-01-21: 30 mL via PERINEURAL

## 2024-01-21 MED ORDER — CEFAZOLIN SODIUM-DEXTROSE 2-4 GM/100ML-% IV SOLN
2.0000 g | INTRAVENOUS | Status: AC
Start: 2024-01-21 — End: 2024-01-21
  Administered 2024-01-21: 2 g via INTRAVENOUS
  Filled 2024-01-21: qty 100

## 2024-01-21 MED ORDER — MIDAZOLAM HCL 2 MG/2ML IJ SOLN
INTRAMUSCULAR | Status: DC | PRN
  Administered 2024-01-21: 1 mg via INTRAVENOUS

## 2024-01-21 MED ORDER — HYDRALAZINE HCL 50 MG PO TABS
100.0000 mg | ORAL_TABLET | Freq: Three times a day (TID) | ORAL | Status: DC
  Administered 2024-01-21 – 2024-01-22 (×3): 100 mg via ORAL
  Filled 2024-01-21 (×3): qty 2

## 2024-01-21 MED ORDER — FUROSEMIDE 40 MG PO TABS
40.0000 mg | ORAL_TABLET | Freq: Every day | ORAL | Status: DC
  Administered 2024-01-21 – 2024-01-22 (×2): 40 mg via ORAL
  Filled 2024-01-21 (×2): qty 1

## 2024-01-21 MED ORDER — DIPHENHYDRAMINE HCL 50 MG/ML IJ SOLN: 25.0000 mg | Freq: Four times a day (QID) | INTRAMUSCULAR | Status: DC | PRN

## 2024-01-21 MED ORDER — CELECOXIB 200 MG PO CAPS: 200.0000 mg | ORAL_CAPSULE | ORAL | Status: DC

## 2024-01-21 MED ORDER — TRANEXAMIC ACID 1000 MG/10ML IV SOLN
2000.0000 mg | Freq: Once | INTRAVENOUS | Status: AC
  Administered 2024-01-21: 2000 mg via TOPICAL
  Filled 2024-01-21: qty 20

## 2024-01-21 MED ORDER — ENSURE PLUS HIGH PROTEIN PO LIQD: 237.0000 mL | Freq: Two times a day (BID) | ORAL | Status: DC

## 2024-01-21 MED ORDER — ONDANSETRON HCL 4 MG/2ML IJ SOLN: 4.0000 mg | Freq: Once | INTRAMUSCULAR | Status: DC | PRN

## 2024-01-21 MED ORDER — ALUM & MAG HYDROXIDE-SIMETH 200-200-20 MG/5ML PO SUSP
30.0000 mL | Freq: Four times a day (QID) | ORAL | Status: DC | PRN
  Administered 2024-01-21: 30 mL via ORAL
  Filled 2024-01-21: qty 30

## 2024-01-21 SURGICAL SUPPLY — 48 items
BAG COUNTER SPONGE SURGICOUNT (BAG) ×1 IMPLANT
BINDER BREAST LRG (GAUZE/BANDAGES/DRESSINGS) IMPLANT
BINDER BREAST XLRG (GAUZE/BANDAGES/DRESSINGS) IMPLANT
BIOPATCH RED 1 DISK 7.0 (GAUZE/BANDAGES/DRESSINGS) IMPLANT
CANISTER SUCTION 3000ML PPV (SUCTIONS) ×1 IMPLANT
CHLORAPREP W/TINT 26 (MISCELLANEOUS) ×1 IMPLANT
CLIP APPLIE 9.375 MED OPEN (MISCELLANEOUS) ×1 IMPLANT
CNTNR URN SCR LID CUP LEK RST (MISCELLANEOUS) ×1 IMPLANT
COVER PROBE W GEL 5X96 (DRAPES) ×1 IMPLANT
COVER SURGICAL LIGHT HANDLE (MISCELLANEOUS) ×1 IMPLANT
DERMABOND ADVANCED .7 DNX12 (GAUZE/BANDAGES/DRESSINGS) ×1 IMPLANT
DRAIN CHANNEL 19F RND (DRAIN) ×1 IMPLANT
DRAPE LAPAROSCOPIC ABDOMINAL (DRAPES) ×1 IMPLANT
DRAPE UTILITY XL STRL (DRAPES) IMPLANT
DRSG TEGADERM 4X4.75 (GAUZE/BANDAGES/DRESSINGS) IMPLANT
ELECTRODE REM PT RTRN 9FT ADLT (ELECTROSURGICAL) ×1 IMPLANT
EVACUATOR SILICONE 100CC (DRAIN) ×1 IMPLANT
GAUZE PAD ABD 8X10 STRL (GAUZE/BANDAGES/DRESSINGS) IMPLANT
GAUZE SPONGE 4X4 12PLY STRL (GAUZE/BANDAGES/DRESSINGS) IMPLANT
GLOVE BIO SURGEON STRL SZ8 (GLOVE) ×1 IMPLANT
GLOVE BIOGEL PI IND STRL 8 (GLOVE) ×1 IMPLANT
GOWN STRL REUS W/ TWL LRG LVL3 (GOWN DISPOSABLE) ×3 IMPLANT
GOWN STRL REUS W/ TWL XL LVL3 (GOWN DISPOSABLE) ×1 IMPLANT
KIT BASIN OR (CUSTOM PROCEDURE TRAY) ×1 IMPLANT
KIT TURNOVER KIT B (KITS) ×1 IMPLANT
NDL 18GX1X1/2 (RX/OR ONLY) (NEEDLE) ×1 IMPLANT
NDL FILTER BLUNT 18X1 1/2 (NEEDLE) IMPLANT
NDL HYPO 25GX1X1/2 BEV (NEEDLE) ×1 IMPLANT
NEEDLE 18GX1X1/2 (RX/OR ONLY) (NEEDLE) IMPLANT
NEEDLE FILTER BLUNT 18X1 1/2 (NEEDLE) ×3 IMPLANT
NEEDLE HYPO 25GX1X1/2 BEV (NEEDLE) ×2 IMPLANT
PACK GENERAL/GYN (CUSTOM PROCEDURE TRAY) ×1 IMPLANT
PAD ARMBOARD POSITIONER FOAM (MISCELLANEOUS) ×1 IMPLANT
PENCIL SMOKE EVACUATOR (MISCELLANEOUS) ×1 IMPLANT
SOLN 0.9% NACL 1000 ML (IV SOLUTION) ×1 IMPLANT
SOLN 0.9% NACL POUR BTL 1000ML (IV SOLUTION) ×1 IMPLANT
SPECIMEN JAR X LARGE (MISCELLANEOUS) ×1 IMPLANT
SUT ETHILON 2 0 FS 18 (SUTURE) IMPLANT
SUT ETHILON 3 0 FSL (SUTURE) ×1 IMPLANT
SUT MNCRL AB 4-0 PS2 18 (SUTURE) ×1 IMPLANT
SUT VIC AB 3-0 SH 18 (SUTURE) ×1 IMPLANT
SYR 3ML LL SCALE MARK (SYRINGE) IMPLANT
SYR 5ML LUER SLIP (SYRINGE) IMPLANT
SYR CONTROL 10ML LL (SYRINGE) ×1 IMPLANT
TOWEL GREEN STERILE (TOWEL DISPOSABLE) ×1 IMPLANT
TOWEL GREEN STERILE FF (TOWEL DISPOSABLE) ×1 IMPLANT
TUBE CONNECTING 12X1/4 (SUCTIONS) IMPLANT
YANKAUER SUCT BULB TIP NO VENT (SUCTIONS) IMPLANT

## 2024-01-21 NOTE — Plan of Care (Signed)

## 2024-01-21 NOTE — Anesthesia Postprocedure Evaluation (Signed)
 Anesthesia Post Note  Patient: [name]  Procedure(s) Performed: MASTECTOMY WITH SENTINEL LYMPH NODE BIOPSY (Left: Breast)     Patient location during evaluation: PACU Anesthesia Type: Regional and General Level of consciousness: awake and alert Pain management: pain level controlled Vital Signs Assessment: post-procedure vital signs reviewed and stable Respiratory status: spontaneous breathing, nonlabored ventilation, respiratory function stable and patient connected to nasal cannula oxygen Cardiovascular status: blood pressure returned to baseline and stable Postop Assessment: no apparent nausea or vomiting Anesthetic complications: no   No notable events documented.  Last Vitals:  Vitals:   01/21/24 0925 01/21/24 0940  BP: (!) 165/60 (!) 156/63  Pulse: 61 (!) 58  Resp: 12 12  Temp: (!) 36.4 C   SpO2: 95% 95%    Last Pain:  Vitals:   01/21/24 0940  TempSrc:   PainSc: 8                  Sriyan Cutting

## 2024-01-21 NOTE — Anesthesia Procedure Notes (Addendum)
 Anesthesia Regional Block: Pectoralis block   Pre-Anesthetic Checklist: , timeout performed,  Correct Patient, Correct Site, Correct Laterality,  Correct Procedure, Correct Position, site marked,  Risks and benefits discussed,  Surgical consent,  Pre-op evaluation,  At surgeon's request and post-op pain management  Laterality: Left  Prep: chloraprep       Needles:  Injection technique: Single-shot  Needle Type: Echogenic Stimulator Needle     Needle Length: 5cm  Needle Gauge: 22     Additional Needles:   Procedures:, nerve stimulator,,, ultrasound used (permanent image in chart),,    Narrative:  Start time: 01/21/2024 7:15 AM End time: 01/21/2024 7:20 AM Injection made incrementally with aspirations every 5 mL.  Performed by: Personally  Anesthesiologist: Mallory Manus, MD  Additional Notes: Functioning IV was confirmed and monitors were applied.  A 50mm 22ga Arrow echogenic stimulator needle was used. Sterile prep and drape,hand hygiene and sterile gloves were used. Ultrasound guidance: relevant anatomy identified, needle position confirmed, local anesthetic spread visualized around nerve(s)., vascular puncture avoided.  Image printed for medical record. Negative aspiration and negative test dose prior to incremental administration of local anesthetic. The patient tolerated the procedure well.   NO PICK

## 2024-01-21 NOTE — Op Note (Signed)
 Preoperative diagnosis: Stage II left breast cancer recurrent triple negative lower inner quadrant and overlapping sites  Postoperative diagnosis: Same  Procedure: Left simple mastectomy with injection of methylene blue dye and mag trace and left axillary deep sentinel lymph node mapping  Surgeon: Debby Shipper, MD   Assistant: Waddell Chiles, RNFA  Anesthesia: LMA with left pectoral block and 0.25% Marcaine   EBL: 20 cc  Specimen: Left breast to pathology, 1 left axillary deep level 1 sentinel lymph node blue and hot  Drains: 19 round  Indications for procedure: The patient is a 78 year old female with a previous history of left breast cancer status post breast conserving surgery, radiation therapy and medical management who presents with a new onset left breast cancer lower inner quadrant.  This was triple negative and she received neoadjuvant chemotherapy.  She had a moderate response to therapy and presents today for surgical intervention.  We discussed the pros and cons of this and the potential issue of additional therapy after surgery depending on results.  She initially wished to have breast reconstruction but she continues to smoke and therefore that was going to be delayed especially with her previous history of radiation therapy and the potential need for a flap.  She opted for unilateral mastectomy after discussing potentially bilateral mastectomies as well.  We discussed risks and benefits as well as with previous radiation therapy she may have issues with wound healing on this side as well as flap necrosis and/or death.  Reviewed risk of lymphedema and injury to blood vessels and nerves of the arm as well as shoulder stiffness requiring physical therapy.  We reviewed contralateral mastectomy and the pros and cons of that as well as complication rate.  After discussion of all the above she was to proceed with left simple mastectomy with left axillary sentinel lymph node mapping.  We  also explained the use of mag trace and blue dye as well as complications of each and also failure of mapping and the need for additional surgery depending on final findings from surgery.The surgical and non surgical options have been discussed with the patient.  Risks of surgery include bleeding,  Infection,  Flap necrosis,  Tissue loss,  Chronic pain, death, Numbness,  And the need for additional procedures.  Reconstruction options also have been discussed with the patient as well.  The patient agrees to proceed. Sentinel lymph node mapping and dissection has been discussed with the patient.  Risk of bleeding,  Infection,  Seroma formation,  Additional procedures,,  Shoulder weakness ,  Shoulder stiffness,  Nerve and blood vessel injury and reaction to the mapping dyes have been discussed.  Alternatives to surgery have been discussed with the patient.  The patient agrees to proceed.     Description of procedure: The patient was met in the holding area and the procedure reviewed and all questions answered.  Left side was marked as correct site.  Pectoral block administered by anesthesia.  She was taken back to the operative room and placed upon upon the OR table.  After induction of general anesthesia, left breast was prepped and 2 cc of mag trace as well as 4 cc of methylene blue, 2 cc mixed with 2 cc of saline were injected for mapping purposes.  The left breast was then prepped and draped a second time and timeout performed again.  Curvilinear incision was made above and below the nipple areolar complex in a fishmouth configuration.  The superior skin flap was taken to the clavicle,  the inferior skin flap taken to the inframammary fold and medially to the breast was divided right of the sternum.  We then raised her skin flaps in a medial to lateral fashion.  Laterally, the breast was detached from its lateral attachments and is passed off the field.  The specimen is oriented with a single stitch superior.   We then used the Sentimag probe.  Dissection showed a blue-brown node in the level 1 base and this was removed as a sentinel node.  Signals were quite weak given previous history of radiation therapy and previous lumpectomies.  Additional interrogation of the left axilla revealed no other significant spike in signal, no discoloration of other nodes and no palpable adenopathy in level 1, level 2 or even level 3.  The long thoracic nerve, thoracodorsal trunk and axillary vein were all preserved.  Hemostasis was achieved with cautery clips and TXA soaked sponge for 5 minutes.  Upon examination flaps were viable and there is no bleeding.  A 19 round drain was placed to the inferior skin flap through a separate stab incision.  Was secured to skin with 3-0 nylon.  We then closed the skin flaps combination of 3-0 Vicryl and 4 Monocryl.  She had some oozing from the skin controlled with 3-0 nylon sutures.  She had good hemostasis at this point in time.  No hematoma.  The drain was placed to bulb suction.  All counts were found to be correct.  Dermabond and breast binder placed.  The patient was then awoke extubated taken to recovery in satisfactory condition.

## 2024-01-21 NOTE — Interval H&P Note (Signed)
 History and Physical Interval Note:  01/21/2024 7:14 AM  [name]  has presented today for surgery, with the diagnosis of RECURRENT BREAST CANCER, LEFT.  The various methods of treatment have been discussed with the patient and family. After consideration of risks, benefits and other options for treatment, the patient has consented to  Procedure(s) with comments: MASTECTOMY WITH SENTINEL LYMPH NODE BIOPSY (Left) - GEN w/PEC BLOCK LEFT SIMPLE MASTECTOMY LEFT SENTINEL LYMPH NODE BIOPSY as a surgical intervention.  The patient's history has been reviewed, patient examined, no change in status, stable for surgery.  I have reviewed the patient's chart and labs.  Questions were answered to the patient's satisfaction.     Dezhane Staten A Angeliz Settlemyre

## 2024-01-21 NOTE — Transfer of Care (Signed)
 Immediate Anesthesia Transfer of Care Note  Patient: [name]  Procedure(s) Performed: MASTECTOMY WITH SENTINEL LYMPH NODE BIOPSY (Left: Breast)  Patient Location: PACU  Anesthesia Type:General  Level of Consciousness: awake, alert , and oriented  Airway & Oxygen Therapy: Patient Spontanous Breathing and Patient connected to nasal cannula oxygen  Post-op Assessment: Report given to RN and Post -op Vital signs reviewed and stable  Post vital signs: Reviewed and stable  Last Vitals:  Vitals Value Taken Time  BP 165/60 01/21/24 09:25  Temp 36.4 C 01/21/24 09:25  Pulse 62 01/21/24 09:30  Resp 20 01/21/24 09:29  SpO2 91 % 01/21/24 09:30  Vitals shown include unfiled device data.  Last Pain:  Vitals:   01/21/24 0627  TempSrc:   PainSc: 0-No pain         Complications: No notable events documented.

## 2024-01-21 NOTE — Progress Notes (Signed)
 Spoke to Dr. Mallory regarding patient's B/P being elevated.  Received orders to give her normal morning B/P medications.  Spoke to [place], [place] D who is ordering those and sending them.

## 2024-01-21 NOTE — Anesthesia Procedure Notes (Signed)
 Procedure Name: LMA Insertion Date/Time: 01/21/2024 7:48 AM  Performed by: Lockie Flesher, CRNAPre-anesthesia Checklist: Patient identified, Emergency Drugs available, Suction available and Patient being monitored Patient Re-evaluated:Patient Re-evaluated prior to induction Oxygen Delivery Method: Circle System Utilized Preoxygenation: Pre-oxygenation with 100% oxygen Induction Type: IV induction Ventilation: Mask ventilation without difficulty LMA: LMA inserted LMA Size: 3.0 Number of attempts: 1 Airway Equipment and Method: Bite block Placement Confirmation: positive ETCO2 Tube secured with: Tape Dental Injury: Teeth and Oropharynx as per pre-operative assessment

## 2024-01-22 ENCOUNTER — Encounter (HOSPITAL_COMMUNITY): Payer: Self-pay | Admitting: Surgery

## 2024-01-22 DIAGNOSIS — C50312 Malignant neoplasm of lower-inner quadrant of left female breast: Secondary | ICD-10-CM | POA: Diagnosis not present

## 2024-01-22 MED ORDER — OXYCODONE HCL 5 MG PO TABS: 5.0000 mg | ORAL_TABLET | Freq: Four times a day (QID) | ORAL | 0 refills | Status: DC | PRN

## 2024-01-22 NOTE — Plan of Care (Signed)

## 2024-01-22 NOTE — Discharge Instructions (Signed)
 [facility] [phone]  MASTECTOMY: POST OP INSTRUCTIONS  Always review your discharge instruction sheet given to you by the facility where your surgery was performed. IF YOU HAVE DISABILITY OR FAMILY LEAVE FORMS, YOU MUST BRING THEM TO THE OFFICE FOR PROCESSING.   DO NOT GIVE THEM TO YOUR DOCTOR. A prescription for pain medication may be given to you upon discharge.  Take your pain medication as prescribed, if needed.  If narcotic pain medicine is not needed, then you may take acetaminophen (Tylenol) or ibuprofen (Advil) as needed. Take your usually prescribed medications unless otherwise directed. If you need a refill on your pain medication, please contact your pharmacy.  They will contact our office to request authorization.  Prescriptions will not be filled after 5pm or on week-ends. You should follow a light diet the first few days after arrival home, such as soup and crackers, etc.  Resume your normal diet the day after surgery. Most patients will experience some swelling and bruising on the chest and underarm.  Ice packs will help.  Swelling and bruising can take several days to resolve.  It is common to experience some constipation if taking pain medication after surgery.  Increasing fluid intake and taking a stool softener (such as Colace) will usually help or prevent this problem from occurring.  A mild laxative (Milk of Magnesia or Miralax) should be taken according to package instructions if there are no bowel movements after 48 hours. Unless discharge instructions indicate otherwise, leave your bandage dry and in place until your next appointment in 3-5 days.  You may take a limited sponge bath.  No tube baths or showers until the drains are removed.  You may have steri-strips (small skin tapes) in place directly over the incision.  These strips should be left on the skin for 7-10 days.  If your surgeon used skin glue on the incision, you may shower in 24 hours.   The glue will flake off over the next 2-3 weeks.  Any sutures or staples will be removed at the office during your follow-up visit. DRAINS:  If you have drains in place, it is important to keep a list of the amount of drainage produced each day in your drains.  Before leaving the hospital, you should be instructed on drain care.  Call our office if you have any questions about your drains. ACTIVITIES:  You may resume regular (light) daily activities beginning the next day--such as daily self-care, walking, climbing stairs--gradually increasing activities as tolerated.  You may have sexual intercourse when it is comfortable.  Refrain from any heavy lifting or straining until approved by your doctor. You may drive when you are no longer taking prescription pain medication, you can comfortably wear a seatbelt, and you can safely maneuver your car and apply brakes. RETURN TO WORK:  __________________________________________________________ [name] should see your doctor in the office for a follow-up appointment approximately 3-5 days after your surgery.  Your doctor's nurse will typically make your follow-up appointment when she calls you with your pathology report.  Expect your pathology report 2-3 business days after your surgery.  You may call to check if you do not hear from [place] after three days.   OTHER INSTRUCTIONS: ______________________________________________________________________________________________ ____________________________________________________________________________________________ WHEN TO CALL YOUR DOCTOR: Fever over 101.0 Nausea and/or vomiting Extreme swelling or bruising Continued bleeding from incision. Increased pain, redness, or drainage from the incision. The clinic staff is available to answer your questions during regular business hours.  Please don't hesitate  to call and ask to speak to one of the nurses for clinical concerns.  If you have a medical emergency, go to the  nearest emergency room or call 911.  A surgeon from [facility] is always on call at the hospital. [address], [place], [place]  [address] ? [address] [address], [place], [place]   [address] [phone] ? [phone] ? FAX [phone] Web site: [email or web address]

## 2024-01-22 NOTE — Discharge Summary (Signed)
 Physician Discharge Summary  Patient ID: [name] MRN: [id number] DOB/AGE: 1945/08/18 78 y.o.  Admit date: 01/21/2024 Discharge date: 01/22/2024  Admission Diagnoses:left breast cancer  Discharge Diagnoses:  Principal Problem:   Left breast cancer with T3 tumor, >5 cm in greatest dimension [facility])   Discharged Condition: good  Hospital Course: pt did well post mastectomy and was discharged home        Treatments: surgery: left mastectomy  Discharge Exam: Blood pressure (!) 155/45, pulse (!) 58, temperature 98.6 F (37 C), temperature source Oral, resp. rate 18, height 5' 4 (1.626 m), weight 68.5 kg, SpO2 90%. General appearance: alert and cooperative Resp: clear to auscultation bilaterally Cardio: NSE Incision/Wound: CDI no hematoma flaps viable drain serous   Disposition: Discharge disposition: 01-Home or Self Care       Discharge Instructions     Diet - low sodium heart healthy   Complete by: As directed    Increase activity slowly   Complete by: As directed       Allergies as of 01/22/2024       Reactions   Yellow Dyes (non-tartrazine) Swelling   12/14 hives  & facial swelling with Eleanore Puller Aid   Ace Inhibitors Swelling   Facial swelling, angioedema Because of a history of documented adverse serious drug reaction;Medi Alert bracelet is recommended per MD   Angiotensin Receptor Blockers Other (See Comments)   Unknown reaction Can not take ARBs - per MD   Lotensin [benazepril Hcl] Swelling   Facial swelling, angioedema Because of a history of documented adverse serious drug reaction;Medi Alert bracelet is recommended per MD        Medication List     TAKE these medications    acetaminophen  500 MG tablet Commonly known as: TYLENOL  Take 1,000 mg by mouth 2 (two) times daily as needed for headache or fever (pain).   amLODipine  10 MG tablet Commonly known as: NORVASC  Take 1 tablet (10 mg total) by mouth daily. For high blood pressure    atorvastatin  40 MG tablet Commonly known as: LIPITOR Take 1 tablet (40 mg total) by mouth daily.   buPROPion  150 MG 24 hr tablet Commonly known as: WELLBUTRIN  XL TAKE 1 TABLET (150 MG TOTAL) BY MOUTH IN THE MORNING DAILY.   chlorthalidone  25 MG tablet Commonly known as: HYGROTON  Take 1 tablet (25 mg total) by mouth daily.   EPINEPHrine  0.3 mg/0.3 mL Soaj injection Commonly known as: EPI-PEN Inject 0.3 mg into the muscle as needed for anaphylaxis.   furosemide  40 MG tablet Commonly known as: LASIX  Take 40 mg by mouth daily.   hydrALAZINE  100 MG tablet Commonly known as: APRESOLINE  Take 1 tablet (100 mg total) by mouth 3 (three) times daily.   isosorbide  mononitrate 30 MG 24 hr tablet Commonly known as: IMDUR  Take 1 tablet (30 mg total) by mouth daily.   labetalol  200 MG tablet Commonly known as: NORMODYNE  Take 2 tablets (400 mg total) by mouth 2 (two) times daily.   lidocaine -prilocaine  cream Commonly known as: EMLA  Apply 1 Application topically once as needed (numbing, port access).   omeprazole  40 MG capsule Commonly known as: PRILOSEC TAKE 1 CAPSULE BY MOUTH EVERY DAY What changed:  how much to take when to take this reasons to take this   oxyCODONE 5 MG immediate release tablet Commonly known as: Oxy IR/ROXICODONE Take 1 tablet (5 mg total) by mouth every 6 (six) hours as needed for severe pain (pain score 7-10).   VITAMIN B-12 PO Take  1 tablet by mouth daily.         Signed: Debby LABOR Gurshan Settlemire 01/22/2024, 8:27 AM

## 2024-01-24 ENCOUNTER — Encounter: Payer: Self-pay | Admitting: *Deleted

## 2024-01-24 ENCOUNTER — Ambulatory Visit: Payer: Self-pay | Admitting: Surgery

## 2024-01-24 NOTE — Progress Notes (Signed)
 [name]                                          MRN: [id number]   01/24/2024   The [facility]BCI Quality Team Specialist reviewed this patient medical record for the purposes of chart review for care gap closure. The following were reviewed: chart review for care gap closure-controlling blood pressure.    VBCI Quality Team

## 2024-01-27 ENCOUNTER — Encounter: Payer: Self-pay | Admitting: *Deleted

## 2024-01-28 LAB — SURGICAL PATHOLOGY

## 2024-02-03 ENCOUNTER — Telehealth: Payer: Self-pay

## 2024-02-03 NOTE — Telephone Encounter (Signed)
Spoke with patient and confirmed appointment on 10/28

## 2024-02-04 ENCOUNTER — Inpatient Hospital Stay: Attending: Hematology and Oncology | Admitting: Hematology and Oncology

## 2024-02-04 VITALS — BP 147/55 | HR 50 | Temp 97.2°F | Resp 17 | Wt 151.7 lb

## 2024-02-04 DIAGNOSIS — Z17421 Hormone receptor negative with human epidermal growth factor receptor 2 negative status: Secondary | ICD-10-CM | POA: Diagnosis not present

## 2024-02-04 DIAGNOSIS — C50312 Malignant neoplasm of lower-inner quadrant of left female breast: Secondary | ICD-10-CM | POA: Diagnosis not present

## 2024-02-04 DIAGNOSIS — Z171 Estrogen receptor negative status [ER-]: Secondary | ICD-10-CM

## 2024-02-05 ENCOUNTER — Encounter: Payer: Self-pay | Admitting: Hematology and Oncology

## 2024-02-05 NOTE — Progress Notes (Signed)
 [facility] Cancer Follow up:    [name], MD [address] [place] [address]   DIAGNOSIS:  Cancer Staging  Malignant neoplasm of lower-inner quadrant of left breast in female, estrogen receptor negative (HCC) Staging form: Breast, AJCC 8th Edition - Clinical stage from 08/28/2023: Stage IIB (cT2, cN0, cM0, G3, ER-, PR-, HER2-) - Signed by Loretha Ash, MD on 08/28/2023 Stage prefix: Initial diagnosis Histologic grading system: 3 grade system Laterality: Left Staged by: Pathologist and managing physician Stage used in treatment planning: Yes National guidelines used in treatment planning: Yes Type of national guideline used in treatment planning: NCCN    SUMMARY OF ONCOLOGIC HISTORY: Oncology History  Malignant neoplasm of lower-inner quadrant of left breast in female, estrogen receptor negative (HCC)  08/15/2023 Mammogram   Highly suspicious mass with associated calcifications in the left breast at 8 o'clock measuring 2.8 cm. The calcifications extend slightly outside of the mass by 0.5 cm or less. Resolution of the previously seen mass in the right breast at 12o'clock. No mammographic evidence of malignancy in the right breast.     08/20/2023 Pathology Results   Left breast needle core biopsy showed grade 3 IDC, triple negative.   08/26/2023 Initial Diagnosis   Malignant neoplasm of lower-inner quadrant of left breast in female, estrogen receptor negative (HCC)   08/28/2023 Cancer Staging   Staging form: Breast, AJCC 8th Edition - Clinical stage from 08/28/2023: Stage IIB (cT2, cN0, cM0, G3, ER-, PR-, HER2-) - Signed by Loretha Ash, MD on 08/28/2023 Stage prefix: Initial diagnosis Histologic grading system: 3 grade system Laterality: Left Staged by: Pathologist and managing physician Stage used in treatment planning: Yes National guidelines used in treatment planning: Yes Type of national guideline used in treatment planning: NCCN   09/19/2023  - 11/02/2023 Chemotherapy   Patient is on Treatment Plan : BREAST TC q21d      Genetic Testing   [facility] CancerNext Panel+RNA was Negative. Report date is 09/08/2023.   The [facility] CancerNext+RNAinsight Panel includes sequencing, rearrangement analysis, and RNA analysis for the following 40 genes: APC, ATM, BAP1, BARD1, BMPR1A, BRCA1, BRCA2, BRIP1, CDH1, CDKN2A, CHEK2, FH, FLCN, MET, MLH1, MSH2, MSH6, MUTYH, NF1, NTHL1, PALB2, PMS2, PTEN, RAD51C, RAD51D, RPS20, SMAD4, STK11, TP53, TSC1, TSC2, and VHL (sequencing and deletion/duplication); AXIN2, HOXB13, MBD4, MSH3, POLD1 and POLE (sequencing only); EPCAM and GREM1 (deletion/duplication only).      CURRENT THERAPY: Taxotere /Cytoxan   INTERVAL HISTORY:  Discussed the use of AI scribe software for clinical note transcription with the patient, who gave verbal consent to proceed.  History of Present Illness  [name] is a 78 year old female with triple negative breast cancer who presents for follow up.  She underwent surgery on January 21, 2024, for the removal of her left breast due to breast cancer. The surgery revealed a tumor of approximately 2.5 centimeters, with a significant portion being necrotic. She had received prior chemotherapy before surgery. The lymph nodes were not involved.  Post-operatively, she experiences significant pain, particularly at night, which she attributes to the presence of a surgical drain. The drain is currently outputting about two to two and a half ounces of fluid daily. She is awaiting a follow-up appointment in November for further evaluation.  Regarding her social history, she consumes wine two to three times a week, primarily on weekends when socializing with friends.    Patient Active Problem List   Diagnosis Date Noted   Left breast cancer with T3 tumor, >5  cm in greatest dimension (HCC) 01/21/2024   History of gout 12/03/2023   Chronic diastolic heart failure (HCC) 11/29/2023   Acute exacerbation of  CHF (congestive heart failure) (HCC) 11/21/2023   Bilateral calf pain 11/21/2023   Hypertensive emergency 11/21/2023   History of breast cancer 11/21/2023   GAD (generalized anxiety disorder) 11/21/2023   DOE (dyspnea on exertion) 11/20/2023   Port-A-Cath in place 10/31/2023   Skin abnormality 09/30/2023   Genetic testing 09/11/2023   Malignant neoplasm of lower-inner quadrant of left breast in female, estrogen receptor negative (HCC) 08/26/2023   CKD (chronic kidney disease) stage 3, GFR 30-59 ml/min (HCC) 06/16/2023   Muscle cramping 07/09/2022   Weight loss 05/04/2022   Subacute cough 05/04/2022   Vitamin D  deficiency 12/04/2021   Low vitamin B12 level 12/04/2021   Clavicle enlargement 07/23/2018   Osteopenia 06/14/2018   Achilles tendinitis 01/29/2017   Bilateral carotid artery occlusion 01/29/2017   Neuropathy at site of breast surgery 01/28/2017   Prediabetes 07/24/2015   GERD (gastroesophageal reflux disease) 07/20/2015   Vitiligo 07/07/2012   PLANTAR FASCIITIS 06/16/2010   Tobacco dependence due to cigarettes 06/14/2009   Hyperlipidemia 11/10/2008   Angioedema 01/09/2008   Carotid stenosis 06/17/2007   Essential hypertension 06/12/2007   Personal history of malignant neoplasm of breast 02/25/2007   History of peripheral vascular disease 02/21/2007    is allergic to yellow dyes (non-tartrazine), ace inhibitors, angiotensin receptor blockers, and lotensin [benazepril hcl].  MEDICAL HISTORY: Past Medical History:  Diagnosis Date   Absence of menstruation    amenorrhea   Angioneurotic edema not elsewhere classified    due to ACE-I   Breast cancer (HCC)    Breast disorder    breast cancer 2007   Cancer [facility]) 2007   Left Breast Cancer   Carotid stenosis    Carotid US  (8/15):  RICA 40-59%; LICA 40-59% >>> FU 1 year   Chronic kidney disease    CKD3   GERD (gastroesophageal reflux disease)    H/O: rheumatic fever    History of breast cancer 07/13/2015   HLD  (hyperlipidemia)    Occlusion and stenosis of carotid artery without mention of cerebral infarction    Other abnormal glucose    fasting hyperglycemia   Peripheral vascular disease    Carotid Stenosis   Personal history of malignant neoplasm of breast    Personal history of radiation therapy 2007   Pneumonia    Pre-diabetes    Unspecified essential hypertension     SURGICAL HISTORY: Past Surgical History:  Procedure Laterality Date   APPENDECTOMY  1972   In [place]    BREAST BIOPSY Left 12/06/2005   malignant   BREAST BIOPSY Left 08/20/2023   US  LT BREAST BX W LOC DEV 1ST LESION IMG BX SPEC US  GUIDE 08/20/2023 [facility] MAMMOGRAPHY   BREAST LUMPECTOMY Left 01/08/2006   COLONOSCOPY  2005   negative; Dr Kristie   COLONOSCOPY N/A 09/02/2015   Procedure: COLONOSCOPY;  Surgeon: Margo LITTIE Haddock, MD;  Location: [facility]ITE;  Service: Endoscopy;  Laterality: N/A;  1215-moved to 1130 Ginger to notify pt   MASTECTOMY W/ SENTINEL NODE BIOPSY Left 01/21/2024   Procedure: MASTECTOMY WITH SENTINEL LYMPH NODE BIOPSY;  Surgeon: Vanderbilt Ned, MD;  Location: [facility] OR;  Service: General;  Laterality: Left;   PORTACATH PLACEMENT N/A 09/18/2023   Procedure: INSERTION, TUNNELED CENTRAL VENOUS DEVICE, WITH PORT;  Surgeon: Vanderbilt Ned, MD;  Location: [facility] OR;  Service: General;  Laterality: N/A;    SOCIAL  HISTORY: Social History   Socioeconomic History   Marital status: Single    Spouse name: Not on file   Number of children: 0   Years of education: Not on file   Highest education level: High school graduate  Occupational History   Not on file  Tobacco Use   Smoking status: Some Days    Current packs/day: 0.50    Average packs/day: 0.5 packs/day for 48.0 years (24.0 ttl pk-yrs)    Types: Cigarettes    Passive exposure: Current   Smokeless tobacco: Never   Tobacco comments:    Smokes daily.    Vaping Use   Vaping status: Never Used  Substance and Sexual Activity   Alcohol use: Yes     Alcohol/week: 1.0 standard drink of alcohol    Types: 1 Standard drinks or equivalent per week    Comment: occasionally   Drug use: No   Sexual activity: Yes    Birth control/protection: Post-menopausal  Other Topics Concern   Not on file  Social History Narrative   Single, no children.   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    Social Drivers of Corporate Investment Banker Strain: Low Risk  (11/25/2023)   Overall Financial Resource Strain (CARDIA)    Difficulty of Paying Living Expenses: Not very hard  Food Insecurity: No Food Insecurity (01/21/2024)   Hunger Vital Sign    Worried About Running Out of Food in the Last Year: Never true    Ran Out of Food in the Last Year: Never true  Transportation Needs: No Transportation Needs (01/21/2024)   PRAPARE - Administrator, Civil Service (Medical): No    Lack of Transportation (Non-Medical): No  Physical Activity: Sufficiently Active (05/31/2023)   Exercise Vital Sign    Days of Exercise per Week: 5 days    Minutes of Exercise per Session: 60 min  Stress: No Stress Concern Present (05/31/2023)   [employer] of Occupational Health - Occupational Stress Questionnaire    Feeling of Stress : Not at all  Social Connections: Socially Isolated (01/21/2024)   Social Connection and Isolation Panel    Frequency of Communication with Friends and Family: Twice a week    Frequency of Social Gatherings with Friends and Family: Once a week    Attends Religious Services: Never    Database Administrator or Organizations: No    Attends Banker Meetings: Never    Marital Status: Never married  Intimate Partner Violence: Not At Risk (01/21/2024)   Humiliation, Afraid, Rape, and Kick questionnaire    Fear of Current or Ex-Partner: No    Emotionally Abused: No    Physically Abused: No    Sexually Abused: No    FAMILY HISTORY: Family History  Problem Relation Age of Onset   Breast cancer Sister 85    Hypertension Other        some of Pts brothers and sisters   Heart attack Neg Hx    Stroke Neg Hx    Diabetes Neg Hx    Heart disease Neg Hx     Review of Systems  Constitutional:  Negative for appetite change, chills, fatigue, fever and unexpected weight change.  HENT:   Negative for hearing loss, lump/mass and trouble swallowing.   Eyes:  Negative for eye problems and icterus.  Respiratory:  Negative for chest tightness, cough and shortness of breath.   Cardiovascular:  Negative for chest pain, leg swelling and palpitations.  Gastrointestinal:  Negative for abdominal distention, abdominal pain, constipation, diarrhea, nausea and vomiting.  Endocrine: Negative for hot flashes.  Genitourinary:  Negative for difficulty urinating.   Musculoskeletal:  Negative for arthralgias.  Skin:  Negative for itching and rash.  Neurological:  Negative for dizziness, extremity weakness, headaches and numbness.  Hematological:  Negative for adenopathy. Does not bruise/bleed easily.  Psychiatric/Behavioral:  Negative for depression. The patient is not nervous/anxious.       PHYSICAL EXAMINATION     Vitals:   02/04/24 1137  BP: (!) 147/55  Pulse: (!) 50  Resp: 17  Temp: (!) 97.2 F (36.2 C)  SpO2: 100%    She appears well. No acute distress  LABORATORY DATA:  CBC    Component Value Date/Time   WBC 5.3 01/16/2024 1500   RBC 3.93 01/16/2024 1500   HGB 11.2 (L) 01/16/2024 1500   HGB 9.5 (L) 12/03/2023 1239   HGB 13.3 06/27/2020 1354   HGB 12.4 05/07/2011 1356   HCT 35.6 (L) 01/16/2024 1500   HCT 40.3 06/27/2020 1354   HCT 37.8 05/07/2011 1356   PLT 211 01/16/2024 1500   PLT 344 12/03/2023 1239   PLT 287 06/27/2020 1354   MCV 90.6 01/16/2024 1500   MCV 85 06/27/2020 1354   MCV 84.3 05/07/2011 1356   MCH 28.5 01/16/2024 1500   MCHC 31.5 01/16/2024 1500   RDW 13.9 01/16/2024 1500   RDW 13.1 06/27/2020 1354   RDW 12.9 05/07/2011 1356   LYMPHSABS 1.8 12/03/2023 1239    LYMPHSABS 4.4 (H) 06/27/2020 1354   LYMPHSABS 5.1 (H) 05/07/2011 1356   MONOABS 0.5 12/03/2023 1239   MONOABS 0.5 05/07/2011 1356   EOSABS 0.3 12/03/2023 1239   EOSABS 0.1 06/27/2020 1354   BASOSABS 0.0 12/03/2023 1239   BASOSABS 0.0 06/27/2020 1354   BASOSABS 0.1 05/07/2011 1356    CMP     Component Value Date/Time   NA 136 01/16/2024 1500   NA 136 06/27/2020 1354   K 4.2 01/16/2024 1500   CL 104 01/16/2024 1500   CO2 20 (L) 01/16/2024 1500   GLUCOSE 106 (H) 01/16/2024 1500   BUN 35 (H) 01/16/2024 1500   BUN 15 06/27/2020 1354   CREATININE 1.57 (H) 01/16/2024 1500   CREATININE 1.48 (H) 12/03/2023 1239   CALCIUM  9.2 01/16/2024 1500   PROT 6.8 12/03/2023 1239   PROT 7.5 01/31/2021 1027   ALBUMIN 3.6 12/03/2023 1239   ALBUMIN 4.3 01/31/2021 1027   AST 17 12/03/2023 1239   ALT 14 12/03/2023 1239   ALKPHOS 64 12/03/2023 1239   BILITOT 0.4 12/03/2023 1239   GFRNONAA 34 (L) 01/16/2024 1500   GFRNONAA 36 (L) 12/03/2023 1239     ASSESSMENT and THERAPY PLAN:   Assessment and Plan Assessment & Plan Triple negative breast cancer of the left breast, post-neoadjuvant chemotherapy, received 3 cycles of TC Status post-mastectomy for triple negative left breast cancer with a 2.5 cm largely necrotic tumor, indicating partial response to preoperative chemotherapy. No lymph node involvement. No current plan for additional IV chemotherapy. - Initiate adjuvant oral chemotherapy with capecitabine (Xeloda) for 18 weeks (6 cycles) post-healing, approximately in one month. - Monitor for side effects such as nausea, diarrhea, and hand-foot syndrome, cytopenias - I will see her back in few weeks to allow healing and we can start her on adj chemo.  Postoperative pain related to left breast surgery and surgical drain Postoperative pain associated with left breast surgery and presence of surgical drain. Drain output  currently too high for removal, contributing to discomfort. - Monitor drain  output and remove when less than 20 mL per day to prevent seroma formation. - Coordinate with surgeon for pain management.  All questions were answered. The patient knows to call the clinic with any problems, questions or concerns. We can certainly see the patient much sooner if necessary.  Total encounter time:30 minutes*in face-to-face visit time, chart review, lab review, care coordination, order entry, and documentation of the encounter time.  *Total Encounter Time as defined by the Centers for Medicare and Medicaid Services includes, in addition to the face-to-face time of a patient visit (documented in the note above) non-face-to-face time: obtaining and reviewing outside history, ordering and reviewing medications, tests or procedures, care coordination (communications with other health care professionals or caregivers) and documentation in the medical record.

## 2024-02-07 ENCOUNTER — Encounter: Payer: Self-pay | Admitting: *Deleted

## 2024-02-12 ENCOUNTER — Encounter (HOSPITAL_BASED_OUTPATIENT_CLINIC_OR_DEPARTMENT_OTHER): Payer: Self-pay

## 2024-02-13 ENCOUNTER — Ambulatory Visit (HOSPITAL_BASED_OUTPATIENT_CLINIC_OR_DEPARTMENT_OTHER): Admitting: Family

## 2024-02-13 ENCOUNTER — Encounter (HOSPITAL_BASED_OUTPATIENT_CLINIC_OR_DEPARTMENT_OTHER): Payer: Self-pay | Admitting: Family

## 2024-02-13 ENCOUNTER — Encounter: Payer: Self-pay | Admitting: *Deleted

## 2024-02-13 VITALS — BP 132/62 | HR 48 | Ht 64.0 in | Wt 152.2 lb

## 2024-02-13 DIAGNOSIS — E785 Hyperlipidemia, unspecified: Secondary | ICD-10-CM | POA: Diagnosis not present

## 2024-02-13 DIAGNOSIS — I10 Essential (primary) hypertension: Secondary | ICD-10-CM | POA: Diagnosis not present

## 2024-02-13 DIAGNOSIS — I5032 Chronic diastolic (congestive) heart failure: Secondary | ICD-10-CM | POA: Diagnosis not present

## 2024-02-13 NOTE — Patient Instructions (Addendum)
 Medication Instructions:  Continue your current medications  If your blood pressure is consistently more than 130/80 when at doctor's visit or when checked at home, let us  know. If so, we could consider increasing your Isosorbide  Mononitrate to 60mg  daily.    Labwork: Your physician recommends that you return for lab work today: direct LDL, renin-aldosterone, TSH   Follow-Up: As scheduled with Dr. Wonda in March 2026 With [facility] September 2026   Special Instructions:   Tips to Measure your Blood Pressure Correctly  Here's what you can do to ensure a correct reading:  Don't drink a caffeinated beverage or smoke during the 30 minutes before the test.  Sit quietly for five minutes before the test begins.  During the measurement, sit in a chair with your feet on the floor and your arm supported so your elbow is at about heart level.  The inflatable part of the cuff should completely cover at least 80% of your upper arm, and the cuff should be placed on bare skin, not over a shirt.  Don't talk during the measurement.  Have your blood pressure measured twice, with a brief break in between. If the readings are different by 5 points or more, have it done a third time.  Blood pressure categories  Blood pressure category SYSTOLIC (upper number)  DIASTOLIC (lower number)  Normal Less than 120 mm Hg and Less than 80 mm Hg  Elevated 120-129 mm Hg and Less than 80 mm Hg  High blood pressure: Stage 1 hypertension 130-139 mm Hg or 80-89 mm Hg  High blood pressure: Stage 2 hypertension 140 mm Hg or higher or 90 mm Hg or higher  Hypertensive crisis (consult your doctor immediately) Higher than 180 mm Hg and/or Higher than 120 mm Hg  Source: American Heart Association and American Stroke Association. For more on getting your blood pressure under control, buy Controlling Your Blood Pressure, a Special Health Report from [facility].   Blood Pressure Log   Date    Time  Blood Pressure  Position  Example: Nov 1 9 AM 124/78 sitting

## 2024-02-13 NOTE — Progress Notes (Unsigned)
 Advanced Hypertension Clinic Assessment:    Date:  02/13/2024   ID:  [name], DOB 1945/11/19, MRN [id number]  PCP:  [name], MD  Cardiologist:  Ozell Fell, MD  Nephrologist:  Referring MD: Colletta Manuelita Garre, *   CC: Hypertension  History of Present Illness:    [name] is a 78 y.o. female with a hx of breast cancer on chemotherapy since 08/2023, CKDIIIa,carotid artery stenosis, PAD, vertigo, HFpEF, HTN.  Prior carotid duplex 2023 bilateral 1-39% stenosis.  Admitted 11/2023 with volume overload in the setting of hypertensive emergency.  It was suspected chemotherapy and steroid use may be contributory.  Echo LVEF 60 to 65%, grade 2 diastolic dysfunction, normal RV.  Hospitalization complicated by AKI and nephrology consulted and suspected contrast-induced nephropathy peak.  She also had acute on chronic anemia requiring transfusion and IV iron .  She was seen by the heart failure clinic 11/29/2023 doing overall well well since discharge.  She was recommended for improved blood pressure control to reduce risk of heart failure exacerbation.  She was maintained on labetalol  400 twice daily, hydralazine  100 3 times daily, amlodipine  10 mg daily.  She was recommended for updated edema and consideration of resumption of spironolactone .  Her oncologist consulted it was noted that her present chemotherapy regimen typically does not cause Retention but can increase risk of fluid retention.  At [facility] visit 12/23/2023 clearance was provided for mastectomy.  Her BP was not at goal and as such Imdur  30 mg daily initiated.  She was not checking BP routinely at home Miss found her home cuff difficult to use without assistance.  She was smoking cigarettes some days.  She was advised to continue hydralazine  100 mg 3 times daily, amlodipine  10 mg daily, labetalol  400 mg twice daily.  She underwent mastectomy 01/21/2024.  Presents today for follow-up. Her BP is  controlled today in clinic, was 147/55 at recent oncology visit. She has not been checking routinely at home. Hopeful to have her drain removed at her upcoming visit with surgeon Dr. Vanderbilt. Reports no chest pain, exertional dyspnea, edema. Notes having misplaced some assistance paperwork from her ***  Previous antihypertensives: ACE-angioedema   Past Medical History:  Diagnosis Date   Absence of menstruation    amenorrhea   Angioneurotic edema not elsewhere classified    due to ACE-I   Breast cancer (HCC)    Breast disorder    breast cancer 2007   Cancer [facility]) 2007   Left Breast Cancer   Carotid stenosis    Carotid US  (8/15):  RICA 40-59%; LICA 40-59% >>> FU 1 year   Chronic kidney disease    CKD3   GERD (gastroesophageal reflux disease)    H/O: rheumatic fever    History of breast cancer 07/13/2015   HLD (hyperlipidemia)    Occlusion and stenosis of carotid artery without mention of cerebral infarction    Other abnormal glucose    fasting hyperglycemia   Peripheral vascular disease    Carotid Stenosis   Personal history of malignant neoplasm of breast    Personal history of radiation therapy 2007   Pneumonia    Pre-diabetes    Unspecified essential hypertension     Past Surgical History:  Procedure Laterality Date   APPENDECTOMY  1972   In [place]    BREAST BIOPSY Left 12/06/2005   malignant   BREAST BIOPSY Left 08/20/2023   US  LT BREAST BX W LOC DEV 1ST LESION IMG BX SPEC US  GUIDE  08/20/2023 [facility]MO[facility]   BREAST LUMPECTOMY Left 01/08/2006   COLONOSCOPY  2005   negative; Dr Kristie   COLONOSCOPY N/A 09/02/2015   Procedure: COLONOSCOPY;  Surgeon: Margo LITTIE Haddock, MD;  Location: [facility] SUITE;  Service: Endoscopy;  Laterality: N/A;  1215-moved to 1130 Ginger to notify pt   MASTECTOMY W/ SENTINEL NODE BIOPSY Left 01/21/2024   Procedure: MASTECTOMY WITH SENTINEL LYMPH NODE BIOPSY;  Surgeon: Vanderbilt Ned, MD;  Location: [facility] OR;  Service: General;  Laterality:  Left;   PORTACATH PLACEMENT N/A 09/18/2023   Procedure: INSERTION, TUNNELED CENTRAL VENOUS DEVICE, WITH PORT;  Surgeon: Vanderbilt Ned, MD;  Location: [facility] OR;  Service: General;  Laterality: N/A;    Current Medications: Current Meds  Medication Sig   acetaminophen  (TYLENOL ) 500 MG tablet Take 1,000 mg by mouth 2 (two) times daily as needed for headache or fever (pain).   amLODipine  (NORVASC ) 10 MG tablet Take 1 tablet (10 mg total) by mouth daily. For high blood pressure   atorvastatin  (LIPITOR) 40 MG tablet Take 1 tablet (40 mg total) by mouth daily.   buPROPion  (WELLBUTRIN  XL) 150 MG 24 hr tablet TAKE 1 TABLET (150 MG TOTAL) BY MOUTH IN THE MORNING DAILY.   chlorthalidone  (HYGROTON ) 25 MG tablet Take 1 tablet (25 mg total) by mouth daily.   Cyanocobalamin  (VITAMIN B-12 PO) Take 1 tablet by mouth daily.   EPINEPHrine  0.3 mg/0.3 mL IJ SOAJ injection Inject 0.3 mg into the muscle as needed for anaphylaxis.   furosemide  (LASIX ) 40 MG tablet Take 40 mg by mouth daily.   hydrALAZINE  (APRESOLINE ) 100 MG tablet Take 1 tablet (100 mg total) by mouth 3 (three) times daily.   isosorbide  mononitrate (IMDUR ) 30 MG 24 hr tablet Take 1 tablet (30 mg total) by mouth daily.   labetalol  (NORMODYNE ) 200 MG tablet Take 2 tablets (400 mg total) by mouth 2 (two) times daily.   omeprazole  (PRILOSEC) 40 MG capsule TAKE 1 CAPSULE BY MOUTH EVERY DAY (Patient taking differently: Take 40 mg by mouth daily as needed (acid reflux).)   oxyCODONE (OXY IR/ROXICODONE) 5 MG immediate release tablet Take 1 tablet (5 mg total) by mouth every 6 (six) hours as needed for severe pain (pain score 7-10).     Allergies:   Yellow dyes (non-tartrazine), Ace inhibitors, Angiotensin receptor blockers, and Lotensin [benazepril hcl]   Social History   Socioeconomic History   Marital status: Single    Spouse name: Not on file   Number of children: 0   Years of education: Not on file   Highest education level: High school graduate   Occupational History   Not on file  Tobacco Use   Smoking status: Some Days    Current packs/day: 0.50    Average packs/day: 0.5 packs/day for 48.0 years (24.0 ttl pk-yrs)    Types: Cigarettes    Passive exposure: Current   Smokeless tobacco: Never   Tobacco comments:    Smokes daily.    Vaping Use   Vaping status: Never Used  Substance and Sexual Activity   Alcohol use: Yes    Alcohol/week: 1.0 standard drink of alcohol    Types: 1 Standard drinks or equivalent per week    Comment: occasionally   Drug use: No   Sexual activity: Yes    Birth control/protection: Post-menopausal  Other Topics Concern   Not on file  Social History Narrative   Single, no children.   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  Social Drivers of Corporate Investment Banker Strain: Low Risk  (11/25/2023)   Overall Financial Resource Strain (CARDIA)    Difficulty of Paying Living Expenses: Not very hard  Food Insecurity: No Food Insecurity (01/21/2024)   Hunger Vital Sign    Worried About Running Out of Food in the Last Year: Never true    Ran Out of Food in the Last Year: Never true  Transportation Needs: No Transportation Needs (01/21/2024)   PRAPARE - Administrator, Civil Service (Medical): No    Lack of Transportation (Non-Medical): No  Physical Activity: Sufficiently Active (05/31/2023)   Exercise Vital Sign    Days of Exercise per Week: 5 days    Minutes of Exercise per Session: 60 min  Stress: No Stress Concern Present (05/31/2023)   [employer] of Occupational Health - Occupational Stress Questionnaire    Feeling of Stress : Not at all  Social Connections: Socially Isolated (01/21/2024)   Social Connection and Isolation Panel    Frequency of Communication with Friends and Family: Twice a week    Frequency of Social Gatherings with Friends and Family: Once a week    Attends Religious Services: Never    Database Administrator or Organizations: No    Attends Probation Officer: Never    Marital Status: Never married     Family History: The patient's family history includes Breast cancer (age of onset: 101) in her sister; Hypertension in an other family member. There is no history of Heart attack, Stroke, Diabetes, or Heart disease.  ROS:   Please see the history of present illness.     All other systems reviewed and are negative.  EKGs/Labs/Other Studies Reviewed:         Recent Labs: 11/20/2023: B Natriuretic Peptide 918.9; Pro B Natriuretic peptide (BNP) 1,267.0 12/03/2023: ALT 14 01/16/2024: BUN 35; Creatinine, Ser 1.57; Hemoglobin 11.2; Platelets 211; Potassium 4.2; Sodium 136   Recent Lipid Panel    Component Value Date/Time   CHOL 157 12/05/2021 0941   CHOL 181 01/31/2021 1027   TRIG 104.0 12/05/2021 0941   HDL 50.70 12/05/2021 0941   HDL 64 01/31/2021 1027   CHOLHDL 3 12/05/2021 0941   VLDL 20.8 12/05/2021 0941   LDLCALC 86 12/05/2021 0941   LDLCALC 95 01/31/2021 1027   LDLDIRECT 141.8 07/31/2012 1537    Physical Exam:   VS:  BP 132/62   Pulse (!) 48   Ht 5' 4 (1.626 m)   Wt 152 lb 3.2 oz (69 kg)   SpO2 91%   BMI 26.13 kg/m  , BMI Body mass index is 26.13 kg/m. GENERAL:  Well appearing HEENT: Pupils equal round and reactive, fundi not visualized, oral mucosa unremarkable NECK:  No jugular venous distention, waveform within normal limits, carotid upstroke brisk and symmetric, no bruits, no thyromegaly LYMPHATICS:  No cervical adenopathy LUNGS:  Clear to auscultation bilaterally HEART:  RRR.  PMI not displaced or sustained,S1 and S2 within normal limits, no S3, no S4, no clicks, no rubs, no murmurs ABD:  Flat, positive bowel sounds normal in frequency in pitch, no bruits, no rebound, no guarding, no midline pulsatile mass, no hepatomegaly, no splenomegaly EXT:  2 plus pulses throughout, no edema, no cyanosis no clubbing SKIN:  No rashes no nodules NEURO:  Cranial nerves II through XII grossly intact, motor  grossly intact throughout PSYCH:  Cognitively intact, oriented to person place and time   ASSESSMENT/PLAN:    HTN -  BP at goal <130/80. Continue hydralazine  100mg  TID, amlodipine  10mg  daily, labetolol 400mg  BID, Imdur  30mg  daily. Discussed to monitor BP at home at least 2 hours after medications and sitting for 5-10 minutes.  Given prior angioedema with ACE hesitant to use ARB. Secondary hypertension 2009 normal adrenal glands. 04/2022 TSH  Sinus bradycardia - longstanding history. Likely related to her labetolol. She is asymptomatic with no lightheadedness, dizziness. Given her BP remains poorly controlled and agent choice limited by prior intolerances, will continue monitoring with periodic EKG.   HFpEF - Euvolemic and well compensated on exam. GDMT hydralazine  100mg  TID, Imdur  30mg  daily, labetolol 400mg  BID. No ARB/ARNI due to prior angioedema with ACE. No indication for loop diuretic. Low sodium diet, fluid restriction <2L, and daily weights encouraged. Educated to contact our office for weight gain of 2 lbs overnight or 5 lbs in one week.   Carotid stenosis -11/26/2021 bilateral 1-39% stenosis. No lightheadedness, amaurosis fugax. No indication for repeat testing at this time.    HLD, LDL goal <70 - Continue Atorvastatin  40mg  daily. Due for repeat lipid panel. ***  Screening for Secondary Hypertension:    Click here to document screening for secondary causes of HTN  :[id number]}  Relevant Labs/Studies:    Latest Ref Rng & Units 01/16/2024    3:00 PM 12/03/2023   12:39 PM 11/26/2023    6:05 AM  Basic Labs  Sodium 135 - 145 mmol/L 136  140  141   Potassium 3.5 - 5.1 mmol/L 4.2  4.2  4.1   Creatinine 0.44 - 1.00 mg/dL 8.42  8.51  8.16        Latest Ref Rng & Units 05/04/2022    1:55 PM 12/05/2021    9:41 AM  Thyroid    TSH 0.35 - 5.50 uIU/mL 1.23  1.31                   Disposition:    FU with MD/APP/PharmD ***   Medication Adjustments/Labs and Tests Ordered: Current  medicines are reviewed at length with the patient today.  Concerns regarding medicines are outlined above.  Orders Placed This Encounter  Procedures   LDL cholesterol, direct   Aldosterone + renin activity w/ ratio   TSH   No orders of the defined types were placed in this encounter.    Signed, Reche GORMAN Finder, NP  02/13/2024 2:41 PM    [facility]

## 2024-02-14 ENCOUNTER — Telehealth: Payer: Self-pay | Admitting: Licensed Clinical Social Worker

## 2024-02-14 ENCOUNTER — Other Ambulatory Visit: Payer: Self-pay | Admitting: Internal Medicine

## 2024-02-14 ENCOUNTER — Encounter: Payer: Self-pay | Admitting: Licensed Clinical Social Worker

## 2024-02-14 ENCOUNTER — Encounter: Payer: Self-pay | Admitting: *Deleted

## 2024-02-14 DIAGNOSIS — I6523 Occlusion and stenosis of bilateral carotid arteries: Secondary | ICD-10-CM
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
## 2024-02-14 NOTE — Progress Notes (Signed)
 [facility] CSW Progress Note  Clinical Social Worker mailed [employer] forms to pt (including completed medical information page) after receiving forward request from H&V CSW.   Follow Up Plan:  Patient will contact CSW with any support or resource needs    Rache Klimaszewski E Caeli Linehan, LCSW Clinical Social Worker [facility]

## 2024-02-17 ENCOUNTER — Ambulatory Visit: Payer: Self-pay | Admitting: Surgery

## 2024-02-17 ENCOUNTER — Encounter: Payer: Self-pay | Admitting: Hematology and Oncology

## 2024-03-03 ENCOUNTER — Inpatient Hospital Stay: Attending: Hematology and Oncology | Admitting: Hematology and Oncology

## 2024-03-03 ENCOUNTER — Telehealth: Payer: Self-pay | Admitting: Hematology and Oncology

## 2024-03-03 VITALS — BP 111/53 | HR 53 | Temp 97.5°F | Resp 16 | Wt 152.3 lb

## 2024-03-03 DIAGNOSIS — Z720 Tobacco use: Secondary | ICD-10-CM | POA: Insufficient documentation

## 2024-03-03 DIAGNOSIS — C50312 Malignant neoplasm of lower-inner quadrant of left female breast: Secondary | ICD-10-CM | POA: Diagnosis not present

## 2024-03-03 DIAGNOSIS — Z803 Family history of malignant neoplasm of breast: Secondary | ICD-10-CM | POA: Insufficient documentation

## 2024-03-03 DIAGNOSIS — Z171 Estrogen receptor negative status [ER-]: Secondary | ICD-10-CM | POA: Insufficient documentation

## 2024-03-03 NOTE — Progress Notes (Signed)
  Cancer Center Cancer Follow up:    [name], MD [address] [place] [address]   DIAGNOSIS:  Cancer Staging  Malignant neoplasm of lower-inner quadrant of left breast in female, estrogen receptor negative (HCC) Staging form: Breast, AJCC 8th Edition - Clinical stage from 08/28/2023: Stage IIB (cT2, cN0, cM0, G3, ER-, PR-, HER2-) - Signed by Loretha Ash, MD on 08/28/2023 Stage prefix: Initial diagnosis Histologic grading system: 3 grade system Laterality: Left Staged by: Pathologist and managing physician Stage used in treatment planning: Yes National guidelines used in treatment planning: Yes Type of national guideline used in treatment planning: NCCN    SUMMARY OF ONCOLOGIC HISTORY: Oncology History  Malignant neoplasm of lower-inner quadrant of left breast in female, estrogen receptor negative (HCC)  08/15/2023 Mammogram   Highly suspicious mass with associated calcifications in the left breast at 8 o'clock measuring 2.8 cm. The calcifications extend slightly outside of the mass by 0.5 cm or less. Resolution of the previously seen mass in the right breast at 12o'clock. No mammographic evidence of malignancy in the right breast.     08/20/2023 Pathology Results   Left breast needle core biopsy showed grade 3 IDC, triple negative.   08/26/2023 Initial Diagnosis   Malignant neoplasm of lower-inner quadrant of left breast in female, estrogen receptor negative (HCC)   08/28/2023 Cancer Staging   Staging form: Breast, AJCC 8th Edition - Clinical stage from 08/28/2023: Stage IIB (cT2, cN0, cM0, G3, ER-, PR-, HER2-) - Signed by Loretha Ash, MD on 08/28/2023 Stage prefix: Initial diagnosis Histologic grading system: 3 grade system Laterality: Left Staged by: Pathologist and managing physician Stage used in treatment planning: Yes National guidelines used in treatment planning: Yes Type of national guideline used in treatment planning: NCCN   09/19/2023  - 11/02/2023 Chemotherapy   Patient is on Treatment Plan : BREAST TC q21d      Genetic Testing   [facility] CancerNext Panel+RNA was Negative. Report date is 09/08/2023.   The [facility] CancerNext+RNAinsight Panel includes sequencing, rearrangement analysis, and RNA analysis for the following 40 genes: APC, ATM, BAP1, BARD1, BMPR1A, BRCA1, BRCA2, BRIP1, CDH1, CDKN2A, CHEK2, FH, FLCN, MET, MLH1, MSH2, MSH6, MUTYH, NF1, NTHL1, PALB2, PMS2, PTEN, RAD51C, RAD51D, RPS20, SMAD4, STK11, TP53, TSC1, TSC2, and VHL (sequencing and deletion/duplication); AXIN2, HOXB13, MBD4, MSH3, POLD1 and POLE (sequencing only); EPCAM and GREM1 (deletion/duplication only).      CURRENT THERAPY: Taxotere /Cytoxan   INTERVAL HISTORY:  Discussed the use of AI scribe software for clinical note transcription with the patient, who gave verbal consent to proceed.  History of Present Illness  [name] is a 78 year old female with triple negative breast cancer who presents for follow-up after surgery and to discuss oral chemotherapy.  She underwent surgery for triple negative breast cancer in October and is recovering well. 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 Her employer requires a note confirming her ability to return to work due to her recent surgery.  She is here to discuss starting oral chemotherapy with Xeloda. The medication is to be taken twice a day for two weeks, followed by a one-week break, repeated for six to eight cycles.   Her surgical site is still healing, with a large scab present, but she reports no pain or discomfort. She is scheduled to have her port removed on December 10th.  She also mentions needing a prescription for bras post-surgery and is awaiting an appointment for this,  which requires documentation from her medical team.  No new or unusual symptoms since the last visit. No pain or discomfort at the surgical site.    Patient Active Problem  List   Diagnosis Date Noted   Left breast cancer with T3 tumor, >5 cm in greatest dimension (HCC) 01/21/2024   History of gout 12/03/2023   Chronic diastolic heart failure (HCC) 11/29/2023   Acute exacerbation of CHF (congestive heart failure) (HCC) 11/21/2023   Bilateral calf pain 11/21/2023   Hypertensive emergency 11/21/2023   History of breast cancer 11/21/2023   GAD (generalized anxiety disorder) 11/21/2023   DOE (dyspnea on exertion) 11/20/2023   Port-A-Cath in place 10/31/2023   Skin abnormality 09/30/2023   Genetic testing 09/11/2023   Malignant neoplasm of lower-inner quadrant of left breast in female, estrogen receptor negative (HCC) 08/26/2023   CKD (chronic kidney disease) stage 3, GFR 30-59 ml/min (HCC) 06/16/2023   Muscle cramping 07/09/2022   Weight loss 05/04/2022   Subacute cough 05/04/2022   Vitamin D  deficiency 12/04/2021   Low vitamin B12 level 12/04/2021   Clavicle enlargement 07/23/2018   Osteopenia 06/14/2018   Achilles tendinitis 01/29/2017   Bilateral carotid artery occlusion 01/29/2017   Neuropathy at site of breast surgery 01/28/2017   Prediabetes 07/24/2015   GERD (gastroesophageal reflux disease) 07/20/2015   Vitiligo 07/07/2012   PLANTAR FASCIITIS 06/16/2010   Tobacco dependence due to cigarettes 06/14/2009   Hyperlipidemia 11/10/2008   Angioedema 01/09/2008   Carotid stenosis 06/17/2007   Essential hypertension 06/12/2007   Personal history of malignant neoplasm of breast 02/25/2007   History of peripheral vascular disease 02/21/2007    is allergic to yellow dyes (non-tartrazine), ace inhibitors, angiotensin receptor blockers, and lotensin [benazepril hcl].  MEDICAL HISTORY: Past Medical History:  Diagnosis Date   Absence of menstruation    amenorrhea   Angioneurotic edema not elsewhere classified    due to ACE-I   Breast cancer (HCC)    Breast disorder    breast cancer 2007   Cancer [facility]) 2007   Left Breast Cancer   Carotid stenosis     Carotid US  (8/15):  RICA 40-59%; LICA 40-59% >>> FU 1 year   Chronic kidney disease    CKD3   GERD (gastroesophageal reflux disease)    H/O: rheumatic fever    History of breast cancer 07/13/2015   HLD (hyperlipidemia)    Occlusion and stenosis of carotid artery without mention of cerebral infarction    Other abnormal glucose    fasting hyperglycemia   Peripheral vascular disease    Carotid Stenosis   Personal history of malignant neoplasm of breast    Personal history of radiation therapy 2007   Pneumonia    Pre-diabetes    Unspecified essential hypertension     SURGICAL HISTORY: Past Surgical History:  Procedure Laterality Date   APPENDECTOMY  1972   In [place]    BREAST BIOPSY Left 12/06/2005   malignant   BREAST BIOPSY Left 08/20/2023   US  LT BREAST BX W LOC DEV 1ST LESION IMG BX SPEC US  GUIDE 08/20/2023 [facility] MAMMO[facility]HY   BREAST LUMPECTOMY Left 01/08/2006   COLONOSCOPY  2005   negative; Dr Kristie   COLONOSCOPY N/A 09/02/2015   Procedure: COLONOSCOPY;  Surgeon: Margo LITTIE Haddock, MD;  Location: [facility];  Service: Endoscopy;  Laterality: N/A;  1215-moved to 1130 Ginger to notify pt   MASTECTOMY W/ SENTINEL NODE BIOPSY Left 01/21/2024   Procedure: MASTECTOMY WITH SENTINEL LYMPH NODE BIOPSY;  Surgeon: Vanderbilt Ned,  MD;  Location: [facility] OR;  Service: General;  Laterality: Left;   PORTACATH PLACEMENT N/A 09/18/2023   Procedure: INSERTION, TUNNELED CENTRAL VENOUS DEVICE, WITH PORT;  Surgeon: Vanderbilt Ned, MD;  Location: [facility] OR;  Service: General;  Laterality: N/A;    SOCIAL HISTORY: Social History   Socioeconomic History   Marital status: Single    Spouse name: Not on file   Number of children: 0   Years of education: Not on file   Highest education level: High school graduate  Occupational History   Not on file  Tobacco Use   Smoking status: Some Days    Current packs/day: 0.50    Average packs/day: 0.5 packs/day for 48.0 years (24.0 ttl pk-yrs)    Types:  Cigarettes    Passive exposure: Current   Smokeless tobacco: Never   Tobacco comments:    Smokes daily.    Vaping Use   Vaping status: Never Used  Substance and Sexual Activity   Alcohol use: Yes    Alcohol/week: 1.0 standard drink of alcohol    Types: 1 Standard drinks or equivalent per week    Comment: occasionally   Drug use: No   Sexual activity: Yes    Birth control/protection: Post-menopausal  Other Topics Concern   Not on file  Social History Narrative   Single, no children.   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    Social Drivers of Corporate Investment Banker Strain: Low Risk  (11/25/2023)   Overall Financial Resource Strain (CARDIA)    Difficulty of Paying Living Expenses: Not very hard  Food Insecurity: No Food Insecurity (01/21/2024)   Hunger Vital Sign    Worried About Running Out of Food in the Last Year: Never true    Ran Out of Food in the Last Year: Never true  Transportation Needs: No Transportation Needs (01/21/2024)   PRAPARE - Administrator, Civil Service (Medical): No    Lack of Transportation (Non-Medical): No  Physical Activity: Sufficiently Active (05/31/2023)   Exercise Vital Sign    Days of Exercise per Week: 5 days    Minutes of Exercise per Session: 60 min  Stress: No Stress Concern Present (05/31/2023)   [employer] of Occupational Health - Occupational Stress Questionnaire    Feeling of Stress : Not at all  Social Connections: Socially Isolated (01/21/2024)   Social Connection and Isolation Panel    Frequency of Communication with Friends and Family: Twice a week    Frequency of Social Gatherings with Friends and Family: Once a week    Attends Religious Services: Never    Database Administrator or Organizations: No    Attends Banker Meetings: Never    Marital Status: Never married  Intimate Partner Violence: Not At Risk (01/21/2024)   Humiliation, Afraid, Rape, and Kick questionnaire    Fear of  Current or Ex-Partner: No    Emotionally Abused: No    Physically Abused: No    Sexually Abused: No    FAMILY HISTORY: Family History  Problem Relation Age of Onset   Breast cancer Sister 70   Hypertension Other        some of Pts brothers and sisters   Heart attack Neg Hx    Stroke Neg Hx    Diabetes Neg Hx    Heart disease Neg Hx     Review of Systems  Constitutional:  Negative for appetite change, chills, fatigue, fever and unexpected weight change.  HENT:   Negative for hearing loss, lump/mass and trouble swallowing.   Eyes:  Negative for eye problems and icterus.  Respiratory:  Negative for chest tightness, cough and shortness of breath.   Cardiovascular:  Negative for chest pain, leg swelling and palpitations.  Gastrointestinal:  Negative for abdominal distention, abdominal pain, constipation, diarrhea, nausea and vomiting.  Endocrine: Negative for hot flashes.  Genitourinary:  Negative for difficulty urinating.   Musculoskeletal:  Negative for arthralgias.  Skin:  Negative for itching and rash.  Neurological:  Negative for dizziness, extremity weakness, headaches and numbness.  Hematological:  Negative for adenopathy. Does not bruise/bleed easily.  Psychiatric/Behavioral:  Negative for depression. The patient is not nervous/anxious.       PHYSICAL EXAMINATION     Vitals:   03/03/24 1116  BP: (!) 111/53  Pulse: (!) 53  Resp: 16  Temp: (!) 97.5 F (36.4 C)  SpO2: 100%    She appears well. No acute distress Large scab in the left surgical area. No evidence of infection  LABORATORY DATA:  CBC    Component Value Date/Time   WBC 5.3 01/16/2024 1500   RBC 3.93 01/16/2024 1500   HGB 11.2 (L) 01/16/2024 1500   HGB 9.5 (L) 12/03/2023 1239   HGB 13.3 06/27/2020 1354   HGB 12.4 05/07/2011 1356   HCT 35.6 (L) 01/16/2024 1500   HCT 40.3 06/27/2020 1354   HCT 37.8 05/07/2011 1356   PLT 211 01/16/2024 1500   PLT 344 12/03/2023 1239   PLT 287 06/27/2020 1354    MCV 90.6 01/16/2024 1500   MCV 85 06/27/2020 1354   MCV 84.3 05/07/2011 1356   MCH 28.5 01/16/2024 1500   MCHC 31.5 01/16/2024 1500   RDW 13.9 01/16/2024 1500   RDW 13.1 06/27/2020 1354   RDW 12.9 05/07/2011 1356   LYMPHSABS 1.8 12/03/2023 1239   LYMPHSABS 4.4 (H) 06/27/2020 1354   LYMPHSABS 5.1 (H) 05/07/2011 1356   MONOABS 0.5 12/03/2023 1239   MONOABS 0.5 05/07/2011 1356   EOSABS 0.3 12/03/2023 1239   EOSABS 0.1 06/27/2020 1354   BASOSABS 0.0 12/03/2023 1239   BASOSABS 0.0 06/27/2020 1354   BASOSABS 0.1 05/07/2011 1356    CMP     Component Value Date/Time   NA 136 01/16/2024 1500   NA 136 06/27/2020 1354   K 4.2 01/16/2024 1500   CL 104 01/16/2024 1500   CO2 20 (L) 01/16/2024 1500   GLUCOSE 106 (H) 01/16/2024 1500   BUN 35 (H) 01/16/2024 1500   BUN 15 06/27/2020 1354   CREATININE 1.57 (H) 01/16/2024 1500   CREATININE 1.48 (H) 12/03/2023 1239   CALCIUM  9.2 01/16/2024 1500   PROT 6.8 12/03/2023 1239   PROT 7.5 01/31/2021 1027   ALBUMIN  3.6 12/03/2023 1239   ALBUMIN  4.3 01/31/2021 1027   AST 17 12/03/2023 1239   ALT 14 12/03/2023 1239   ALKPHOS 64 12/03/2023 1239   BILITOT 0.4 12/03/2023 1239   GFRNONAA 34 (L) 01/16/2024 1500   GFRNONAA 36 (L) 12/03/2023 1239     ASSESSMENT and THERAPY PLAN:   Assessment and Plan Assessment & Plan  Triple negative breast cancer, post-surgical, planned adjuvant oral chemotherapy Post-surgical status with residual cancer burden of two. No radiation therapy needed. Planned adjuvant oral chemotherapy with Xeloda to reduce recurrence risk. Xeloda may cause nausea, vomiting, diarrhea, hand-foot syndrome, and coronary vasospasm. Initial dose at 75% of calculated dose to assess tolerance. Discontinue if significant side effects occur. - Scheduled follow-up appointment after December 10th  to assess surgical wound healing and initiate Xeloda. - Provided work note for return to work.  Healing surgical wound of breast Surgical wound  healing with large scab, no pain or discomfort. - Advised to allow scab to heal naturally without removal. - Scheduled follow-up appointment after December 10th to assess wound healing.  All questions were answered. The patient knows to call the clinic with any problems, questions or concerns. We can certainly see the patient much sooner if necessary.  Total encounter time:30 minutes*in face-to-face visit time, chart review, lab review, care coordination, order entry, and documentation of the encounter time.  *Total Encounter Time as defined by the Centers for Medicare and Medicaid Services includes, in addition to the face-to-face time of a patient visit (documented in the note above) non-face-to-face time: obtaining and reviewing outside history, ordering and reviewing medications, tests or procedures, care coordination (communications with other health care professionals or caregivers) and documentation in the medical record.

## 2024-03-03 NOTE — Telephone Encounter (Signed)
 I attempted to reach out to patient but voicemail was full. I mailed an appointment reminder of scheduled appointment on 03/26/2024 per 03/03/2024 los.

## 2024-03-04 ENCOUNTER — Other Ambulatory Visit: Payer: Self-pay

## 2024-03-12 ENCOUNTER — Other Ambulatory Visit: Payer: Self-pay

## 2024-03-12 ENCOUNTER — Encounter (HOSPITAL_BASED_OUTPATIENT_CLINIC_OR_DEPARTMENT_OTHER): Payer: Self-pay | Admitting: Surgery

## 2024-03-16 ENCOUNTER — Encounter (HOSPITAL_BASED_OUTPATIENT_CLINIC_OR_DEPARTMENT_OTHER)
Admission: RE | Admit: 2024-03-16 | Discharge: 2024-03-16 | Disposition: A | Source: Ambulatory Visit | Attending: Surgery

## 2024-03-16 LAB — BASIC METABOLIC PANEL WITH GFR
Anion gap: 11 (ref 5–15)
BUN: 29 mg/dL — ABNORMAL HIGH (ref 8–23)
CO2: 25 mmol/L (ref 22–32)
Calcium: 8.4 mg/dL — ABNORMAL LOW (ref 8.9–10.3)
Chloride: 103 mmol/L (ref 98–111)
Creatinine, Ser: 1.43 mg/dL — ABNORMAL HIGH (ref 0.44–1.00)
GFR, Estimated: 38 mL/min — ABNORMAL LOW (ref >60)
Glucose, Bld: 97 mg/dL (ref 70–99)
Potassium: 4.3 mmol/L (ref 3.5–5.1)
Sodium: 139 mmol/L (ref 135–145)

## 2024-03-16 MED ORDER — CHLORHEXIDINE GLUCONATE CLOTH 2 % EX PADS: 6.0000 | MEDICATED_PAD | Freq: Once | CUTANEOUS | Status: DC

## 2024-03-16 NOTE — Progress Notes (Signed)

## 2024-03-18 ENCOUNTER — Ambulatory Visit (HOSPITAL_BASED_OUTPATIENT_CLINIC_OR_DEPARTMENT_OTHER): Payer: Self-pay | Admitting: Anesthesiology

## 2024-03-18 ENCOUNTER — Encounter (HOSPITAL_BASED_OUTPATIENT_CLINIC_OR_DEPARTMENT_OTHER): Payer: Self-pay | Admitting: Anesthesiology

## 2024-03-18 ENCOUNTER — Encounter (HOSPITAL_BASED_OUTPATIENT_CLINIC_OR_DEPARTMENT_OTHER): Payer: Self-pay | Admitting: Surgery

## 2024-03-18 ENCOUNTER — Other Ambulatory Visit: Payer: Self-pay

## 2024-03-18 ENCOUNTER — Encounter (HOSPITAL_BASED_OUTPATIENT_CLINIC_OR_DEPARTMENT_OTHER): Admission: RE | Disposition: A | Payer: Self-pay | Source: Home / Self Care | Attending: Surgery

## 2024-03-18 ENCOUNTER — Ambulatory Visit (HOSPITAL_BASED_OUTPATIENT_CLINIC_OR_DEPARTMENT_OTHER): Admission: RE | Admit: 2024-03-18 | Discharge: 2024-03-18 | Disposition: A | Attending: Surgery | Admitting: Surgery

## 2024-03-18 DIAGNOSIS — I130 Hypertensive heart and chronic kidney disease with heart failure and stage 1 through stage 4 chronic kidney disease, or unspecified chronic kidney disease: Secondary | ICD-10-CM | POA: Diagnosis not present

## 2024-03-18 DIAGNOSIS — Z87891 Personal history of nicotine dependence: Secondary | ICD-10-CM

## 2024-03-18 DIAGNOSIS — I5032 Chronic diastolic (congestive) heart failure: Secondary | ICD-10-CM | POA: Diagnosis not present

## 2024-03-18 DIAGNOSIS — N183 Chronic kidney disease, stage 3 unspecified: Secondary | ICD-10-CM

## 2024-03-18 DIAGNOSIS — I10 Essential (primary) hypertension: Secondary | ICD-10-CM

## 2024-03-18 DIAGNOSIS — I509 Heart failure, unspecified: Secondary | ICD-10-CM | POA: Diagnosis not present

## 2024-03-18 DIAGNOSIS — Z452 Encounter for adjustment and management of vascular access device: Secondary | ICD-10-CM | POA: Diagnosis not present

## 2024-03-18 HISTORY — PX: PORT-A-CATH REMOVAL: SHX5289

## 2024-03-18 SURGERY — REMOVAL PORT-A-CATH
Anesthesia: Monitor Anesthesia Care | Site: Chest | Laterality: Right

## 2024-03-18 MED ORDER — HYDRALAZINE HCL 20 MG/ML IJ SOLN
INTRAMUSCULAR | Status: AC
  Filled 2024-03-18: qty 1

## 2024-03-18 MED ORDER — FENTANYL CITRATE (PF) 100 MCG/2ML IJ SOLN: 25.0000 ug | INTRAMUSCULAR | Status: DC | PRN

## 2024-03-18 MED ORDER — OXYCODONE HCL 5 MG PO TABS: 5.0000 mg | ORAL_TABLET | Freq: Once | ORAL | Status: DC | PRN

## 2024-03-18 MED ORDER — CEFAZOLIN SODIUM-DEXTROSE 2-4 GM/100ML-% IV SOLN
2.0000 g | INTRAVENOUS | Status: AC
  Administered 2024-03-18: 2 g via INTRAVENOUS

## 2024-03-18 MED ORDER — ACETAMINOPHEN 120 MG RE SUPP
RECTAL | Status: AC
  Filled 2024-03-18: qty 1

## 2024-03-18 MED ORDER — HYDRALAZINE HCL 20 MG/ML IJ SOLN
INTRAMUSCULAR | Status: DC | PRN
  Administered 2024-03-18 (×2): 10 mg via INTRAVENOUS

## 2024-03-18 MED ORDER — ACETAMINOPHEN 500 MG PO TABS
ORAL_TABLET | ORAL | Status: AC
  Filled 2024-03-18: qty 2

## 2024-03-18 MED ORDER — LACTATED RINGERS IV SOLN: INTRAVENOUS | Status: DC

## 2024-03-18 MED ORDER — ACETAMINOPHEN 500 MG PO TABS
1000.0000 mg | ORAL_TABLET | Freq: Once | ORAL | Status: AC
  Administered 2024-03-18: 1000 mg via ORAL

## 2024-03-18 MED ORDER — OXYCODONE HCL 5 MG/5ML PO SOLN: 5.0000 mg | Freq: Once | ORAL | Status: DC | PRN

## 2024-03-18 MED ORDER — BUPIVACAINE-EPINEPHRINE (PF) 0.25% -1:200000 IJ SOLN
INTRAMUSCULAR | Status: AC
  Filled 2024-03-18: qty 30

## 2024-03-18 MED ORDER — DEXMEDETOMIDINE HCL IN NACL 80 MCG/20ML IV SOLN
INTRAVENOUS | Status: AC
  Filled 2024-03-18: qty 20

## 2024-03-18 MED ORDER — PROPOFOL 500 MG/50ML IV EMUL
INTRAVENOUS | Status: AC
  Filled 2024-03-18: qty 50

## 2024-03-18 MED ORDER — ONDANSETRON HCL 4 MG/2ML IJ SOLN
INTRAMUSCULAR | Status: DC | PRN
  Administered 2024-03-18: 4 mg via INTRAVENOUS

## 2024-03-18 MED ORDER — CEFAZOLIN SODIUM-DEXTROSE 2-4 GM/100ML-% IV SOLN
INTRAVENOUS | Status: AC
  Filled 2024-03-18: qty 100

## 2024-03-18 MED ORDER — METOPROLOL TARTRATE 25 MG PO TABS
ORAL_TABLET | ORAL | Status: AC
  Filled 2024-03-18: qty 1

## 2024-03-18 MED ORDER — METOPROLOL TARTRATE 25 MG PO TABS
25.0000 mg | ORAL_TABLET | Freq: Once | ORAL | Status: AC
  Administered 2024-03-18: 25 mg via ORAL

## 2024-03-18 MED ORDER — AMISULPRIDE (ANTIEMETIC) 5 MG/2ML IV SOLN: 10.0000 mg | Freq: Once | INTRAVENOUS | Status: DC | PRN

## 2024-03-18 MED ORDER — PROPOFOL 10 MG/ML IV BOLUS
INTRAVENOUS | Status: DC | PRN
  Administered 2024-03-18: 20 mg via INTRAVENOUS
  Administered 2024-03-18: 30 mg via INTRAVENOUS
  Administered 2024-03-18: 20 mg via INTRAVENOUS

## 2024-03-18 MED ORDER — ONDANSETRON HCL 4 MG/2ML IJ SOLN
INTRAMUSCULAR | Status: AC
  Filled 2024-03-18: qty 2

## 2024-03-18 MED ORDER — FENTANYL CITRATE (PF) 100 MCG/2ML IJ SOLN
INTRAMUSCULAR | Status: DC | PRN
  Administered 2024-03-18: 50 ug via INTRAVENOUS

## 2024-03-18 MED ORDER — PROPOFOL 500 MG/50ML IV EMUL
INTRAVENOUS | Status: DC | PRN
  Administered 2024-03-18: 75 ug/kg/min via INTRAVENOUS

## 2024-03-18 MED ORDER — FENTANYL CITRATE (PF) 100 MCG/2ML IJ SOLN
INTRAMUSCULAR | Status: AC
  Filled 2024-03-18: qty 2

## 2024-03-18 MED ORDER — BUPIVACAINE-EPINEPHRINE (PF) 0.5% -1:200000 IJ SOLN
INTRAMUSCULAR | Status: DC | PRN
  Administered 2024-03-18: 10 mL

## 2024-03-18 SURGICAL SUPPLY — 25 items
BENZOIN TINCTURE PRP APPL 2/3 (GAUZE/BANDAGES/DRESSINGS) IMPLANT
BLADE SURG 15 STRL LF DISP TIS (BLADE) ×1 IMPLANT
CHLORAPREP W/TINT 26 (MISCELLANEOUS) ×1 IMPLANT
COVER BACK TABLE 60X90IN (DRAPES) ×1 IMPLANT
COVER MAYO STAND STRL (DRAPES) ×1 IMPLANT
DERMABOND ADVANCED .7 DNX12 (GAUZE/BANDAGES/DRESSINGS) ×1 IMPLANT
DRAPE LAPAROTOMY 100X72 PEDS (DRAPES) ×1 IMPLANT
DRAPE UTILITY XL STRL (DRAPES) ×1 IMPLANT
ELECTRODE REM PT RTRN 9FT ADLT (ELECTROSURGICAL) ×1 IMPLANT
GLOVE BIOGEL PI IND STRL 8 (GLOVE) ×1 IMPLANT
GLOVE ECLIPSE 8.0 STRL XLNG CF (GLOVE) ×1 IMPLANT
GOWN STRL REUS W/ TWL LRG LVL3 (GOWN DISPOSABLE) ×2 IMPLANT
GOWN STRL REUS W/ TWL XL LVL3 (GOWN DISPOSABLE) ×1 IMPLANT
NDL HYPO 25X1 1.5 SAFETY (NEEDLE) ×1 IMPLANT
PACK BASIN DAY SURGERY FS (CUSTOM PROCEDURE TRAY) ×1 IMPLANT
PENCIL SMOKE EVACUATOR (MISCELLANEOUS) IMPLANT
SLEEVE SCD COMPRESS KNEE MED (STOCKING) IMPLANT
SOLN 0.9% NACL POUR BTL 1000ML (IV SOLUTION) ×1 IMPLANT
SPIKE FLUID TRANSFER (MISCELLANEOUS) ×1 IMPLANT
SPONGE T-LAP 4X18 ~~LOC~~+RFID (SPONGE) ×1 IMPLANT
STRIP CLOSURE SKIN 1/2X4 (GAUZE/BANDAGES/DRESSINGS) IMPLANT
SUT MON AB 4-0 PC3 18 (SUTURE) ×1 IMPLANT
SUT VICRYL 3-0 CR8 SH (SUTURE) ×1 IMPLANT
SYR CONTROL 10ML LL (SYRINGE) ×1 IMPLANT
TOWEL GREEN STERILE FF (TOWEL DISPOSABLE) ×1 IMPLANT

## 2024-03-18 NOTE — H&P (Signed)
 History of Present Illness: [name] is a 78 y.o. female who is seen today for postop check after left simple mastectomy with sent lymph node mapping after neoadjuvant chemotherapy. Nodes were clear. Tumor is down to 2.5 cm. She is doing well. Drain outputs less than 20 cc a day..    Review of Systems: A complete review of systems was obtained from the patient. I have reviewed this information and discussed as appropriate with the patient. See HPI as well for other ROS.    Medical History: Past Medical History:  Diagnosis Date  History of cancer   There is no problem list on file for this patient.  Past Surgical History:  Procedure Laterality Date  MASTECTOMY PARTIAL / LUMPECTOMY  01/09/2016    Allergies  Allergen Reactions  Benazepril Hcl Unknown  REACTION: FACE SWELLING (ANGIOEDEMA); she can not take ARBS !!! Because of a history of documented adverse serious drug reaction;Medi Alert bracelet is recommended  Peanut Unknown  03/13/13 hivesw/o facial swelling  Shellfish Containing Products Unknown  03/15/13 hives & facial swelling  Yellow Dye Unknown  12/14 hives & facial swelling with Eleanore Puller Aid   Current Outpatient Medications on File Prior to Visit  Medication Sig Dispense Refill  amLODIPine  (NORVASC ) 10 MG tablet  aspirin  81 MG chewable tablet Take 81 mg by mouth once daily  atorvastatin  (LIPITOR) 40 MG tablet Take 40 mg by mouth once daily  buPROPion  (WELLBUTRIN  XL) 150 MG XL tablet TAKE 1 TABLET (150 MG TOTAL) BY MOUTH IN THE MORNING DAILY.  chlorthalidone  25 MG tablet Take 25 mg by mouth once daily  EPINEPHrine  (EPIPEN ) 0.3 mg/0.3 mL auto-injector Inject 0.3 mg into the muscle as needed for Anaphylaxis  ergocalciferol , vitamin D2, 1,250 mcg (50,000 unit) capsule Take 50,000 Units by mouth every 7 (seven) days  FUROsemide  (LASIX ) 40 MG tablet Take 40 mg by mouth once daily  gabapentin  (NEURONTIN ) 300 MG capsule Take 1 capsule by mouth at bedtime  hydrALAZINE   (APRESOLINE ) 50 MG tablet  hydroCHLOROthiazide  (HYDRODIURIL ) 25 MG tablet Take 1 tablet by mouth once daily  isosorbide  mononitrate (IMDUR ) 30 MG ER tablet Take 30 mg by mouth once daily  labetaloL  (TRANDATE ) 300 MG tablet Take 1 tablet by mouth 2 (two) times daily  omeprazole  (PRILOSEC) 40 MG DR capsule Take 1 capsule by mouth once daily   No current facility-administered medications on file prior to visit.   Family History  Problem Relation Age of Onset  Breast cancer Sister    Social History   Tobacco Use  Smoking Status Every Day  Types: Cigarettes  Smokeless Tobacco Not on file    Social History   Socioeconomic History  Marital status: Single  Tobacco Use  Smoking status: Every Day  Types: Cigarettes  Substance and Sexual Activity  Alcohol use: Not Currently  Drug use: Not Currently   Social Drivers of Health   Financial Resource Strain: Low Risk (11/25/2023)  Received from [facility]  Overall Financial Resource Strain (CARDIA)  How hard is it for you to pay for the very basics like food, housing, medical care, and heating?: Not very hard  Food Insecurity: No Food Insecurity (01/21/2024)  Received from [facility]  Hunger Vital Sign  Within the past 12 months, you worried that your food would run out before you got the money to buy more.: Never true  Within the past 12 months, the food you bought just didn't last and you didn't have money to get more.: Never true  Transportation Needs: No Transportation Needs (01/21/2024)  Received from [facility] - [facility]  In the past 12 months, has lack of transportation kept you from medical appointments or from getting medications?: No  In the past 12 months, has lack of transportation kept you from meetings, work, or from getting things needed for daily living?: No  Physical Activity: Sufficiently Active (05/31/2023)  Received from [facility]  Exercise Vital Sign  On average, how many days per week do you  engage in moderate to strenuous exercise (like a brisk walk)?: 5 days  On average, how many minutes do you engage in exercise at this level?: 60 min  Stress: No Stress Concern Present (05/31/2023)  Received from [facility] - Occupational Stress Questionnaire  Feeling of Stress : Not at all  Social Connections: Socially Isolated (01/21/2024)  Received from [facility]  Social Connection and Isolation Panel  In a typical week, how many times do you talk on the phone with family, friends, or neighbors?: Twice a week  How often do you get together with friends or relatives?: Once a week  How often do you attend church or religious services?: Never  Do you belong to any clubs or organizations such as church groups, unions, fraternal or athletic groups, or school groups?: No  How often do you attend meetings of the clubs or organizations you belong to?: Never  Are you married, widowed, divorced, separated, never married, or living with a partner?: Never married  Housing Stability: Unknown (12/13/2023)  Housing Stability Vital Sign  Homeless in the Last Year: No   Objective:   Vitals:  02/17/24 0910  PainSc: 0-No pain   There is no height or weight on file to calculate BMI.  Left mastectomy wound clean dry intact. Flaps viable. JP drain serous and removed today.  Labs, Imaging and Diagnostic Testing:  FINAL MICROSCOPIC DIAGNOSIS:   A. BREAST, LEFT, MASTECTOMY:  - Invasive ductal carcinoma with considerable necrosis, 2.5 cm, grade 3  - Resection margins are negative for carcinoma  - Negative for lymphovascular or perineural invasion  - Benign unremarkable nipple and skin  - Biopsy site changes  - See oncology table   B. LYMPH NODE, LEFT, SENTINEL, EXCISION:  - Lymph node, negative for carcinoma (0/1)   ONCOLOGY TABLE:   INVASIVE CARCINOMA OF THE BREAST, STATUS POST NEOADJUVANT TREATMENT:  Resection   Procedure: Mastectomy  Specimen  Laterality: Left  Histologic Type: Invasive ductal carcinoma  Histologic Grade:  Glandular (Acinar)/Tubular Differentiation: 3  Nuclear Pleomorphism: 3  Mitotic Rate: 3  Overall Grade: 3  Tumor Size: 2.5 cm  Ductal Carcinoma In Situ: Not identified  Tumor Extent: Limited to breast parenchyma  Treatment Effect in the Breast: Probable or definite response to  presurgical therapy in the invasive carcinoma  Treatment Effect in Lymph Nodes: No lymph node metastases and no fibrous  scarring or histiocytic aggregates in the nodes  Margins: All margins negative for invasive carcinoma  Distance from Closest Margin (mm): Greater than 10 mm  Regional Lymph Nodes:  Number of Lymph Nodes Examined: 1  Number of Sentinel Nodes Examined: 1  Number of Lymph Nodes with Macrometastases (>2 mm): 0  Number of Lymph Nodes with Micrometastases: 0  Number of Lymph Nodes with Isolated Tumor Cells (=0.2 mm or =200  cells): 0  Size of Largest Metastatic Deposit (millimeters): Not applicable  Extranodal Extension: Not applicable  Distant Metastasis:  Distant Site(s) Involved: Not applicable  Breast Prognostic Profile (Pre-neoadjuvant case #: [id number])  Estrogen Receptor: 0%, negative  Progesterone Receptor: 0%, negative  Her2: Negative (0)  Ki-67: 80%  Will be repeated on the current case (Block #: A4) and the results  reported in an addendum.  Residual Cancer Burden (RCB):  Primary Tumor Bed: 25 mm x 25 mm  Overall Cancer Cellularity: 30%  Percentage of Cancer that is in Situ: 0%  Number of Positive Lymph Nodes: 0  Diameter of Largest Lymph Node metastasis: 0  Residual Cancer Burden: 1.972  Residual Cancer Burden Class: RCB-II  Pathologic Stage Classification (pTNM, AJCC 8th Edition): ypT2, ypN0  Representative Tumor Block: A4  Comment(s): None  ([id number])   GROSS DESCRIPTION:   A: Specimen: Received fresh and placed in formalin at 10:15 AM on  01/21/2024, and labeled left simple  mastectomy (cold ischemic time  greater than 1 hour)  Specimen integrity (intact/disrupted): Intact, with a suture designating  the superior aspect  Weight: 833 g  Size: 21.5 cm medial to lateral by 16.2 cm superior to inferior by 4.5  cm anterior to posterior  Skin: There is a 21.5 x 13.0 cm ellipse of tan-brown wrinkled skin, with  a 1.3 x 1.0 cm brown everted nipple. The central superior aspect of the  skin displays a 6.0 cm well-healed linear scar, the underlying tissue is  indurated. Within the lower outer aspect of the skin surface there is a  4.5 cm well-healed scar.  Tumor/cavity: Within the lower inner quadrant of the breast, there is a  2.5 x 2.5 x 2.0 cm tan-pink, firm, ill-defined lesion identified.  Within the lesion a silver metallic ribbon-shaped biopsy clip is  identified. The lesion measures 2.5 cm to the posterior margin, 2.0 cm  from the inferior anterior margin, and 1.8 cm from the anterior skin.  Please note the patient is status post neoadjuvant therapy.  Uninvolved parenchyma: Yellow lobulated adipose tissue with tan-white  fibrous tissue (approximately 15% fibrous). Within the central upper  aspect of the breast, there is a 3.5 x 2.6 x 2.5 cm tan-white, firm,  ill-defined fibrotic slightly stellate area identified, grossly  consistent with previous lumpectomy site. The central aspect of the  site displays a 2.5 cm cystic structure filled with tan-yellow softened  material. The area measures 3.5 cm to the posterior margin, extends to  the anterior skin underlying the scar noted on the superior aspect.  Within the lower outer quadrant of the breast, approximately 5.0 cm  inferior and lateral to the first lumpectomy site, there is a 4.0 x 1.5  x 1.5 cm tan-white, fibrotic, stellate area identified, grossly  consistent with previous lumpectomy site. The second area measures 3.0  cm to the posterior margin, and extends to the anterior skin, underlying  the possible  scar noted on the lower outer aspect.  Prognostic indicators: Obtain from paraffin blocks if needed  Lymph nodes: None identified  Block summary:  21 blocks submitted  1 = posterior margin closest to primary lesion  2 = anterior-inferior margin closest to lesion  3-9 = lesion from the lower inner quadrant, sequentially submitted from  lateral to medial  10 = skin anterior to lesion  11-13 = central previous lumpectomy site  14-16 = lower outer previous lumpectomy site  17 = nipple  18 = upper outer quadrant  19 = lower outer quadrant  20 = upper inner quadrant  21 = lower inner quadrant   B: The specimen is received fresh and consists of  a 2.1 x 1.4 x 1.1 cm  tan-purple possible lymph node. The specimen is trisected and entirely  submitted in 3 cassettes. SHIRLEEN 01/22/2024)   Final Diagnosis performed by Katrine Muskrat, MD. Electronically  signed 01/23/2024  Technical component performed at [facility]. [facility], [address] [place], [place] [address].  Professional component performed at [facility],  [address] [place], [place] [address].  Immunohistochemistry Technical component (if applicable) was performed  at [facility]. [address], [address],  [place], [place] [address]. IMMUNOHISTOCHEMISTRY DISCLAIMER (if applicable):  Some of these immunohistochemical stains may have been developed and the  performance characteristics determine by [facility]. Some  may not have been cleared or approved by the U.S. Food and Drug  Administration. The FDA has determined that such clearance or approval  is not necessary. This test is used for clinical purposes. It should not  be regarded as investigational or for research. This laboratory is  certified under the Clinical Laboratory Improvement Amendments of 1988  (CLIA-88) as qualified to perform high complexity clinical laboratory  testing. The controls stained  appropriately. IHC stains are performed  on formalin fixed, paraffin embedded tissue using a 3,3diaminobenzidine  (DAB) chromogen and Leica Bond Autostainer System. The staining  intensity of the nucleus is score manually and is reported as the  percentage of tumor cell nuclei demonstrating specific nuclear staining.  The specimens are fixed in 10% Neutral Formalin for at least 6 hours and  up to 72hrs. These tests are validated on decalcified tissue. Results  should be interpreted with caution given the possibility of false  negative results on decalcified specimens. Antibody Clones are as  follows ER-clone 38F, PR-clone 16, Ki67- clone MM1. Some of these  immunohistochemical stains may have been developed and the performance  characteristics determined by [facility].   ADDENDUM:   PROGNOSTIC INDICATOR RESULTS:   Assessment and Plan:   Diagnoses and all orders for this visit:  S/P mastectomy, left    Doing well  She is done with IV chemotherapy therefore we will schedule her for port removal.  No follow-ups on file.  DEBBY CURTISTINE SHIPPER, MD

## 2024-03-18 NOTE — Anesthesia Postprocedure Evaluation (Signed)
 Anesthesia Post Note  Patient: [name]  Procedure(s) Performed: REMOVAL PORT-A-CATH (Right: Chest)     Patient location during evaluation: PACU Anesthesia Type: MAC Level of consciousness: awake Pain management: pain level controlled Vital Signs Assessment: post-procedure vital signs reviewed and stable Respiratory status: spontaneous breathing, nonlabored ventilation and respiratory function stable Cardiovascular status: stable and blood pressure returned to baseline Postop Assessment: no apparent nausea or vomiting Anesthetic complications: no Comments: Patient counseled on dangers of high blood pressure, including heart attack, stroke, and death. BP has improved with hydralazine . She denies headache, chest pain and blurry vision. She has a BP monitor at home and was instructed to check her BP at home, to which she agreed. She was instructed to resume her BP medications per her usual schedule this evening after checking her BP (with the exception of her next dose of hydralazine , which she received here. She will resume it at the next dose after that). She was instructed to go to the ED should her BP increase again and she develop headache, chest pain, or blurry vision. She expressed understanding.   No notable events documented.  Last Vitals:  Vitals:   03/18/24 1721 03/18/24 1726  BP:  (!) 181/65  Pulse:  (!) 55  Resp:  15  Temp:    SpO2: 98% 98%    Last Pain:  Vitals:   03/18/24 1726  TempSrc:   PainSc: 0-No pain                 Delon Aisha Arch

## 2024-03-18 NOTE — Interval H&P Note (Signed)
 History and Physical Interval Note:  03/18/2024 4:07 PM  [name]  has presented today for surgery, with the diagnosis of PORT IN PLACE.  The various methods of treatment have been discussed with the patient and family. After consideration of risks, benefits and other options for treatment, the patient has consented to  Procedure(s): REMOVAL PORT-A-CATH (Right) as a surgical intervention.  The patient's history has been reviewed, patient examined, no change in status, stable for surgery.  I have reviewed the patient's chart and labs.  Questions were answered to the patient's satisfaction.     Orah Sonnen A Lequan Dobratz

## 2024-03-18 NOTE — Op Note (Signed)
Preop diagnosis: Indwelling port a catheter for chemotherapy  Postop diagnosis: Same  Procedure: Removal of port a catheter  Surgeon: Kenzel Ruesch M.D.  Anesthesia: MAC with local  EBL: Minimal  Specimen none  Drains: None  Indications for procedure: The patient presents for removal of port a catheter after completing chemotherapy. The patient no longer requires central venous access. Risks of bleeding, infection, catheter fragmentation, embolization, arrhythmias and damage to arteries, veins and nerves and possibly other mediastinal structures discussed. The patient agrees to proceed.  Description of procedure: The patient was seen in the holding area. Questions were answered. The patient agreed to proceed. The patient was taken to the operating room. The patient was placed supine. Anesthesia was initiated. The skin on the upper chest was prepped and draped in a sterile fashion. Timeout was done. The patient received preoperative antibiotics. Incision was made through the old port site and the hub of the Port-A-Cath was seen. The sutures were cut to release the port from the chest wall. The catheter was removed in its entirety without difficulty. The tract was closed with 3-0 Vicryl. 4 Monocryl was used to close the skin. All final counts were correct. The patient was taken to recovery in satisfactory condition.  

## 2024-03-18 NOTE — Anesthesia Preprocedure Evaluation (Addendum)
 Anesthesia Evaluation  Patient identified by MRN, date of birth, ID band Patient awake    Reviewed: Allergy & Precautions, NPO status , Patient's Chart, lab work & pertinent test results  History of Anesthesia Complications Negative for: history of anesthetic complications  Airway Mallampati: II  TM Distance: >3 FB Neck ROM: Full    Dental  (+) Dental Advisory Given, Poor Dentition, Missing   Pulmonary neg shortness of breath, neg sleep apnea, neg COPD, neg recent URI, former smoker   Pulmonary exam normal breath sounds clear to auscultation       Cardiovascular hypertension (amlodipine , chlorthalidone , furosemide , hydralazine , ISMN, labetalol ), Pt. on medications and Pt. on home beta blockers (-) angina + Peripheral Vascular Disease and +CHF  (-) Past MI, (-) Cardiac Stents and (-) CABG (-) dysrhythmias  Rhythm:Regular Rate:Normal  Carotid stenosis, HLD  TTE 11/21/2023: IMPRESSIONS    1. Left ventricular ejection fraction, by estimation, is 60 to 65%. The  left ventricle has normal function. The left ventricle has no regional  wall motion abnormalities. Left ventricular diastolic parameters are  consistent with Grade II diastolic  dysfunction (pseudonormalization).   2. Right ventricular systolic function is normal. The right ventricular  size is normal. Tricuspid regurgitation signal is inadequate for assessing  PA pressure.   3. Left atrial size was severely dilated.   4. Right atrial size was mildly dilated.   5. The mitral valve is grossly normal. Trivial mitral valve  regurgitation. No evidence of mitral stenosis.   6. The aortic valve is tricuspid. Aortic valve regurgitation is not  visualized. No aortic stenosis is present.   7. The inferior vena cava is normal in size with greater than 50%  respiratory variability, suggesting right atrial pressure of 3 mmHg.     Neuro/Psych  PSYCHIATRIC DISORDERS Anxiety      negative neurological ROS     GI/Hepatic Neg liver ROS,GERD  Medicated,,  Endo/Other  Pre-diabetes  Renal/GU CRFRenal disease     Musculoskeletal Osteopenia    Abdominal   Peds  Hematology negative hematology ROS (+)   Anesthesia Other Findings BP elevated to 208/91. Patient denies HA, vision changes, and chest pain. She reports that she took her amlodipine  this morning but has not taken her labetalol  or hydralazine  in two days. Oral metoprolol  will be given in preop. Discussed with patient that she should continue these medications when she is having surgery and to resume her medications afterwards.  Reproductive/Obstetrics Breast cancer                              Anesthesia Physical Anesthesia Plan  ASA: 3  Anesthesia Plan: MAC   Post-op Pain Management: Tylenol  PO (pre-op)*   Induction: Intravenous  PONV Risk Score and Plan: 2 and Ondansetron , Propofol  infusion, TIVA and Treatment may vary due to age or medical condition  Airway Management Planned: Natural Airway and Simple Face Mask  Additional Equipment:   Intra-op Plan:   Post-operative Plan:   Informed Consent: I have reviewed the patients History and Physical, chart, labs and discussed the procedure including the risks, benefits and alternatives for the proposed anesthesia with the patient or authorized representative who has indicated his/her understanding and acceptance.     Dental advisory given  Plan Discussed with: CRNA and Anesthesiologist  Anesthesia Plan Comments: (Discussed with patient risks of MAC including, but not limited to, minor pain or discomfort, hearing people in the room, and possible need  for backup general anesthesia. Risks for general anesthesia also discussed including, but not limited to, sore throat, hoarse voice, chipped/damaged teeth, injury to vocal cords, nausea and vomiting, allergic reactions, lung infection, heart attack, stroke, and death. All  questions answered. )         Anesthesia Quick Evaluation

## 2024-03-18 NOTE — Discharge Instructions (Addendum)
 #######################################################  GENERAL SURGERY: POST OP INSTRUCTIONS  ######################################################################  EAT Gradually transition to a high fiber diet with a fiber supplement over the next few weeks after discharge.  Start with a pureed / full liquid diet (see below)  WALK Walk an hour a day.  Control your pain to do that.    CONTROL PAIN Control pain so that you can walk, sleep, tolerate sneezing/coughing, go up/down stairs.  HAVE A BOWEL MOVEMENT DAILY Keep your bowels regular to avoid problems.  OK to try a laxative to override constipation.  OK to use an antidairrheal to slow down diarrhea.  Call if not better after 2 tries  CALL IF YOU HAVE PROBLEMS/CONCERNS Call if you are still struggling despite following these instructions. Call if you have concerns not answered by these instructions  ######################################################################    DIET: Follow a light bland diet & liquids the first 24 hours after arrival home, such as soup, liquids, starches, etc.  Be sure to drink plenty of fluids.  Quickly advance to a usual solid diet within a few days.  Avoid fast food or heavy meals as your are more likely to get nauseated or have irregular bowels.  A low-fat, high-fiber diet for the rest of your life is ideal.    Take your usually prescribed home medications unless otherwise directed. Blood thinners:  You can restart any strong blood thinners after the second postoperative day  for example: COUMADIN (warfarin), XERELTO (rivaroxaban), ELIQUIS (apixaban), PLAVIX (clopidigrel), BRILINTA (ticagrelor), EFFIENT (prasugrel), PRADAXA (dabigatran), etc  Continue aspirin  before & after surgery..     Some oozing/bleeding the first 1-2 weeks is common but should taper down & be small volume.    If you are passing many large clots or having uncontrolling bleeding, call your surgeon  PAIN CONTROL: Pain  is best controlled by a usual combination of three different methods TOGETHER: Ice/Heat Over the counter pain medication Prescription pain medication Most patients will experience some swelling and bruising around the incisions.  Ice packs or heating pads (30-60 minutes up to 6 times a day) will help. Use ice for the first few days to help decrease swelling and bruising, then switch to heat to help relax tight/sore spots and speed recovery.  Some people prefer to use ice alone, heat alone, alternating between ice & heat.  Experiment to what works for you.  Swelling and bruising can take several weeks to resolve.   It is helpful to take an over-the-counter pain medication regularly for the first few weeks.  Choose one of the following that works best for you: Naproxen  (Aleve , etc)  Two 220mg  tabs twice a day Ibuprofen  (Advil , etc) Three 200mg  tabs four times a day (every meal & bedtime) Acetaminophen  (Tylenol , etc) 500-650mg  four times a day (every meal & bedtime) A  prescription for pain medication (such as oxycodone , hydrocodone , etc) should be given to you upon discharge.  Take your pain medication as prescribed.  If you are having problems/concerns with the prescription medicine (does not control pain, nausea, vomiting, rash, itching, etc), please call us  [phone] to see if we need to switch you to a different pain medicine that will work better for you and/or control your side effect better. If you need a refill on your pain medication, please contact your pharmacy.  They will contact our office to request authorization. Prescriptions will not be filled after 5 pm or on week-ends.  Avoid getting constipated.  Between the surgery and the pain medications, it is  common to experience some constipation.  Increasing fluid intake and taking a fiber supplement (such as Metamucil, Citrucel, FiberCon, MiraLax, etc) 1-2 times a day regularly will usually help prevent this problem from occurring.  A mild  laxative (prune juice, Milk of Magnesia, MiraLax, etc) should be taken according to package directions if there are no bowel movements after 48 hours.   Watch out for diarrhea.  If you have many loose bowel movements, simplify your diet to bland foods & liquids for a few days.  Stop any stool softeners and decrease your fiber supplement.  Switching to mild anti-diarrheal medications (Loperamide/Imodium, Kayopectate, Pepto Bismol) can help.  If this worsens or does not improve, please call us .  Wash / shower every day.  You may shower over the dressings as they are waterproof.  Continue to shower over incision(s) after the dressing is off. Remove your waterproof bandages 5 days after surgery.  You may leave the incision open to air.  You may have skin tapes (Steri Strips) covering the incision(s).  Leave them on until one week, then remove.  You may replace a dressing/Band-Aid to cover the incision for comfort if you wish.   ACTIVITIES as tolerated:   You may resume regular (light) daily activities beginning the next day--such as daily self-care, walking, climbing stairs--gradually increasing activities as tolerated.  If you can walk 30 minutes without difficulty, it is safe to try more intense activity such as jogging, treadmill, bicycling, low-impact aerobics, swimming, etc. Save the most intensive and strenuous activity for last such as sit-ups, heavy lifting, contact sports, etc  Refrain from any heavy lifting or straining until you are off narcotics for pain control.   DO NOT PUSH THROUGH PAIN.  Let pain be your guide: If it hurts to do something, don't do it.  Pain is your body warning you to avoid that activity for another week until the pain goes down. You may drive when you are no longer taking prescription pain medication, you can comfortably wear a seatbelt, and you can safely maneuver your car and apply brakes. You may have sexual intercourse when it is comfortable.   FOLLOW UP in our  office Please call [facility] at [phone] to set up an appointment to see your surgeon in the office for a follow-up appointment approximately 2-3 weeks after your surgery. Make sure that you call for this appointment the day you arrive home to insure a convenient appointment time.  9. IF YOU HAVE DISABILITY OR FAMILY LEAVE FORMS, BRING THEM TO THE OFFICE FOR PROCESSING.  DO NOT GIVE THEM TO YOUR DOCTOR.   WHEN TO CALL US  [phone]: Poor pain control Reactions / problems with new medications (rash/itching, nausea, etc)  Fever over 101.5 F (38.5 C) Worsening swelling or bruising Continued bleeding from incision. Increased pain, redness, or drainage from the incision Difficulty breathing / swallowing   The clinic staff is available to answer your questions during regular business hours (8:30am-5pm).  Please dont hesitate to call and ask to speak to one of our nurses for clinical concerns.   If you have a medical emergency, go to the nearest emergency room or call 911.  A surgeon from [facility] is always on call at the [facility], [place] [address], [address], [place], [place]  [address] ? MAIN: [phone] ? TOLL FREE: [phone] ?  FAX [phone] [email or web address]  #######################################################   May take Tylenol  after 8 pm, if needed.  Post Anesthesia Home Care Instructions  Activity: Get plenty of rest for the remainder of the day. A responsible individual must stay with you for 24 hours following the procedure.  For the next 24 hours, DO NOT: -Drive a car -Advertising copywriter -Drink alcoholic beverages -Take any medication unless instructed by your physician -Make any legal decisions or sign important papers.  Meals: Start with liquid foods such as gelatin or soup. Progress to regular foods as tolerated. Avoid greasy, spicy, heavy foods. If nausea and/or vomiting occur,  drink only clear liquids until the nausea and/or vomiting subsides. Call your physician if vomiting continues.  Special Instructions/Symptoms: Your throat may feel dry or sore from the anesthesia or the breathing tube placed in your throat during surgery. If this causes discomfort, gargle with warm salt water . The discomfort should disappear within 24 hours.  If you had a scopolamine patch placed behind your ear for the management of post- operative nausea and/or vomiting:  1. The medication in the patch is effective for 72 hours, after which it should be removed.  Wrap patch in a tissue and discard in the trash. Wash hands thoroughly with soap and water . 2. You may remove the patch earlier than 72 hours if you experience unpleasant side effects which may include dry mouth, dizziness or visual disturbances. 3. Avoid touching the patch. Wash your hands with soap and water  after contact with the patch.

## 2024-03-18 NOTE — Transfer of Care (Signed)
 Immediate Anesthesia Transfer of Care Note  Patient: [name]  Procedure(s) Performed: REMOVAL PORT-A-CATH (Right: Chest)  Patient Location: PACU  Anesthesia Type:MAC  Level of Consciousness: awake, alert , and oriented  Airway & Oxygen Therapy: Patient Spontanous Breathing and Patient connected to face mask oxygen  Post-op Assessment: Report given to RN and Post -op Vital signs reviewed and stable  Post vital signs: Reviewed and stable  Last Vitals:  Vitals Value Taken Time  BP 180/73 (100)   Temp    Pulse 55 03/18/24 17:19  Resp 17 03/18/24 17:19  SpO2 100 % 03/18/24 17:19  Vitals shown include unfiled device data.  Last Pain:  Vitals:   03/18/24 1356  TempSrc: Temporal  PainSc: 0-No pain         Complications: No notable events documented.

## 2024-03-19 ENCOUNTER — Encounter (HOSPITAL_BASED_OUTPATIENT_CLINIC_OR_DEPARTMENT_OTHER): Payer: Self-pay | Admitting: Surgery

## 2024-03-26 ENCOUNTER — Inpatient Hospital Stay: Attending: Hematology and Oncology

## 2024-03-26 ENCOUNTER — Other Ambulatory Visit: Payer: Self-pay | Admitting: Hematology and Oncology

## 2024-03-26 ENCOUNTER — Inpatient Hospital Stay: Attending: Hematology and Oncology | Admitting: Hematology and Oncology

## 2024-03-26 ENCOUNTER — Telehealth: Payer: Self-pay | Admitting: Pharmacist

## 2024-03-26 ENCOUNTER — Other Ambulatory Visit (HOSPITAL_COMMUNITY): Payer: Self-pay

## 2024-03-26 ENCOUNTER — Telehealth: Payer: Self-pay

## 2024-03-26 ENCOUNTER — Encounter: Payer: Self-pay | Admitting: Hematology and Oncology

## 2024-03-26 VITALS — BP 136/49 | HR 58 | Temp 98.1°F | Resp 16 | Wt 158.7 lb

## 2024-03-26 DIAGNOSIS — Z803 Family history of malignant neoplasm of breast: Secondary | ICD-10-CM

## 2024-03-26 DIAGNOSIS — Z171 Estrogen receptor negative status [ER-]: Secondary | ICD-10-CM

## 2024-03-26 DIAGNOSIS — Z87891 Personal history of nicotine dependence: Secondary | ICD-10-CM | POA: Insufficient documentation

## 2024-03-26 DIAGNOSIS — Z17421 Hormone receptor negative with human epidermal growth factor receptor 2 negative status: Secondary | ICD-10-CM | POA: Diagnosis not present

## 2024-03-26 DIAGNOSIS — C50312 Malignant neoplasm of lower-inner quadrant of left female breast: Secondary | ICD-10-CM | POA: Insufficient documentation

## 2024-03-26 LAB — CMP (CANCER CENTER ONLY)
ALT: 8 U/L (ref 0–44)
AST: 15 U/L (ref 15–41)
Albumin: 4 g/dL (ref 3.5–5.0)
Alkaline Phosphatase: 81 U/L (ref 38–126)
Anion gap: 13 (ref 5–15)
BUN: 36 mg/dL — ABNORMAL HIGH (ref 8–23)
CO2: 22 mmol/L (ref 22–32)
Calcium: 9.2 mg/dL (ref 8.9–10.3)
Chloride: 106 mmol/L (ref 98–111)
Creatinine: 1.72 mg/dL — ABNORMAL HIGH (ref 0.44–1.00)
GFR, Estimated: 30 mL/min — ABNORMAL LOW (ref >60)
Glucose, Bld: 108 mg/dL — ABNORMAL HIGH (ref 70–99)
Potassium: 4.2 mmol/L (ref 3.5–5.1)
Sodium: 140 mmol/L (ref 135–145)
Total Bilirubin: 0.4 mg/dL (ref 0.0–1.2)
Total Protein: 7.4 g/dL (ref 6.5–8.1)

## 2024-03-26 LAB — CBC WITH DIFFERENTIAL (CANCER CENTER ONLY)
Abs Immature Granulocytes: 0.01 K/uL (ref 0.00–0.07)
Basophils Absolute: 0 K/uL (ref 0.0–0.1)
Basophils Relative: 0 %
Eosinophils Absolute: 0.1 K/uL (ref 0.0–0.5)
Eosinophils Relative: 2 %
HCT: 32.4 % — ABNORMAL LOW (ref 36.0–46.0)
Hemoglobin: 10.2 g/dL — ABNORMAL LOW (ref 12.0–15.0)
Immature Granulocytes: 0 %
Lymphocytes Relative: 36 %
Lymphs Abs: 2.3 K/uL (ref 0.7–4.0)
MCH: 27 pg (ref 26.0–34.0)
MCHC: 31.5 g/dL (ref 30.0–36.0)
MCV: 85.7 fL (ref 80.0–100.0)
Monocytes Absolute: 0.4 K/uL (ref 0.1–1.0)
Monocytes Relative: 6 %
Neutro Abs: 3.7 K/uL (ref 1.7–7.7)
Neutrophils Relative %: 56 %
Platelet Count: 226 K/uL (ref 150–400)
RBC: 3.78 MIL/uL — ABNORMAL LOW (ref 3.87–5.11)
RDW: 14.5 % (ref 11.5–15.5)
WBC Count: 6.5 K/uL (ref 4.0–10.5)
nRBC: 0 % (ref 0.0–0.2)

## 2024-03-26 MED ORDER — PROCHLORPERAZINE MALEATE 10 MG PO TABS
10.0000 mg | ORAL_TABLET | Freq: Four times a day (QID) | ORAL | 1 refills | Status: DC | PRN
  Filled 2024-03-26: qty 30, 8d supply, fill #0

## 2024-03-26 MED ORDER — CAPECITABINE 500 MG PO TABS
825.0000 mg/m2 | ORAL_TABLET | Freq: Two times a day (BID) | ORAL | 5 refills | Status: AC
  Filled 2024-03-31: qty 84, 21d supply, fill #0
  Filled 2024-04-17: qty 84, 21d supply, fill #1
  Filled 2024-05-07: qty 84, 21d supply, fill #2

## 2024-03-26 MED ORDER — ONDANSETRON HCL 8 MG PO TABS
8.0000 mg | ORAL_TABLET | Freq: Three times a day (TID) | ORAL | 1 refills | Status: DC | PRN
  Filled 2024-03-26: qty 30, 10d supply, fill #0

## 2024-03-26 NOTE — Telephone Encounter (Signed)
 Oral Oncology Patient Advocate Encounter  After completing a benefits investigation, prior authorization for Capecitabine is not required at this time through [facility].  Patient's copay is $254.04 or $29.40 [facility] Domino I will discuss cost with patient     Charlott Hamilton,  CPhT-Adv  she/her/hers [facility]  [facility] Pharmacy Technician Patient Advocate Specialist III [facility] Phone: [phone]  Fax: [phone] [email or web address]

## 2024-03-26 NOTE — Telephone Encounter (Signed)
 [facility]        Telephone: [phone]?Fax: [phone]   Oncology Clinical Pharmacist Practitioner Encounter   Received new prescription for capecitabine for the treatment of breast cancer. This is being given monotherapy. It is planned to continue until six months in the adjuvant setting per the CREATE-X trial data.  Labs from 03/26/24 assessed. Prescription dose and frequency assessed. Dr. Loretha saw patient today, reviewed labs, and will see again with labs in 5 weeks.  Reviewed DPYD testing recommendations from FDA/ASCO/NCCN with Dr. Loretha.  Current medication list in Epic reviewed. Significant DDIs with capecitabine identified:No.  Evaluated chart. Patient barriers to medication adherence identified: No.  Patient agreement for treatment documented in physician note on 03/26/24.  Prescription has been e-scribed to the [facility]) for benefits analysis and approval.  Oral Oncology Clinic will continue to follow for insurance authorization, copayment issues, initial counseling and start date.  [name], PharmD, BCOP, CPP Hematology-Oncology Clinical Pharmacist Practitioner  03/26/2024 12:58 PM  **Disclaimer: This note was dictated with voice recognition software. Similar sounding words can inadvertently be transcribed and this note may contain transcription errors which may not have been corrected upon publication of note.**

## 2024-03-26 NOTE — Telephone Encounter (Signed)
 S/w pt regarding this. She is agreeable to come back 03/31/24 at 1530 for DYPD lab. Appt made.

## 2024-03-26 NOTE — Progress Notes (Signed)
 [facility] Cancer Follow up:    [name], MD [address] [place] [address]   DIAGNOSIS:  Cancer Staging  Malignant neoplasm of lower-inner quadrant of left breast in female, estrogen receptor negative (HCC) Staging form: Breast, AJCC 8th Edition - Clinical stage from 08/28/2023: Stage IIB (cT2, cN0, cM0, G3, ER-, PR-, HER2-) - Signed by Loretha Ash, MD on 08/28/2023 Stage prefix: Initial diagnosis Histologic grading system: 3 grade system Laterality: Left Staged by: Pathologist and managing physician Stage used in treatment planning: Yes National guidelines used in treatment planning: Yes Type of national guideline used in treatment planning: NCCN    SUMMARY OF ONCOLOGIC HISTORY: Oncology History  Malignant neoplasm of lower-inner quadrant of left breast in female, estrogen receptor negative (HCC)  08/15/2023 Mammogram   Highly suspicious mass with associated calcifications in the left breast at 8 o'clock measuring 2.8 cm. The calcifications extend slightly outside of the mass by 0.5 cm or less. Resolution of the previously seen mass in the right breast at 12o'clock. No mammographic evidence of malignancy in the right breast.     08/20/2023 Pathology Results   Left breast needle core biopsy showed grade 3 IDC, triple negative.   08/26/2023 Initial Diagnosis   Malignant neoplasm of lower-inner quadrant of left breast in female, estrogen receptor negative (HCC)   08/28/2023 Cancer Staging   Staging form: Breast, AJCC 8th Edition - Clinical stage from 08/28/2023: Stage IIB (cT2, cN0, cM0, G3, ER-, PR-, HER2-) - Signed by Loretha Ash, MD on 08/28/2023 Stage prefix: Initial diagnosis Histologic grading system: 3 grade system Laterality: Left Staged by: Pathologist and managing physician Stage used in treatment planning: Yes National guidelines used in treatment planning: Yes Type of national guideline used in treatment planning: NCCN   09/19/2023  - 11/02/2023 Chemotherapy   Patient is on Treatment Plan : BREAST TC q21d      Genetic Testing   [facility] CancerNext Panel+RNA was Negative. Report date is 09/08/2023.   The [facility] CancerNext+RNAinsight Panel includes sequencing, rearrangement analysis, and RNA analysis for the following 40 genes: APC, ATM, BAP1, BARD1, BMPR1A, BRCA1, BRCA2, BRIP1, CDH1, CDKN2A, CHEK2, FH, FLCN, MET, MLH1, MSH2, MSH6, MUTYH, NF1, NTHL1, PALB2, PMS2, PTEN, RAD51C, RAD51D, RPS20, SMAD4, STK11, TP53, TSC1, TSC2, and VHL (sequencing and deletion/duplication); AXIN2, HOXB13, MBD4, MSH3, POLD1 and POLE (sequencing only); EPCAM and GREM1 (deletion/duplication only).    03/17/2024 -  Chemotherapy   Patient is on Treatment Plan : BREAST Capecitabine q21d       CURRENT THERAPY: Taxotere /Cytoxan   INTERVAL HISTORY:  Discussed the use of AI scribe software for clinical note transcription with the patient, who gave verbal consent to proceed.  History of Present Illness  [name] is a 78 year old female with left breast cancer, status post mastectomy, prior radiation, and chemotherapy, presenting for oncology follow-up to initiate adjuvant capecitabine therapy.  She reports feeling well overall with no significant changes since her last visit. She denies fever, chills, sweats, or other acute symptoms.  She recently underwent port removal; the surgical site has a big scab and a seroma.  She discussed the planned capecitabine regimen, including dosing schedule and anticipated duration.  Rest of the pertinent 10 point ROS reviewed and neg  Patient Active Problem List   Diagnosis Date Noted   Left breast cancer with T3 tumor, >5 cm in greatest dimension (HCC) 01/21/2024   History of gout 12/03/2023   Chronic diastolic heart failure (HCC) 11/29/2023   Acute exacerbation of CHF (  congestive heart failure) (HCC) 11/21/2023   Bilateral calf pain 11/21/2023   Hypertensive emergency 11/21/2023   History of breast cancer  11/21/2023   GAD (generalized anxiety disorder) 11/21/2023   DOE (dyspnea on exertion) 11/20/2023   Port-A-Cath in place 10/31/2023   Skin abnormality 09/30/2023   Genetic testing 09/11/2023   Malignant neoplasm of lower-inner quadrant of left breast in female, estrogen receptor negative (HCC) 08/26/2023   CKD (chronic kidney disease) stage 3, GFR 30-59 ml/min (HCC) 06/16/2023   Muscle cramping 07/09/2022   Weight loss 05/04/2022   Subacute cough 05/04/2022   Vitamin D  deficiency 12/04/2021   Low vitamin B12 level 12/04/2021   Clavicle enlargement 07/23/2018   Osteopenia 06/14/2018   Achilles tendinitis 01/29/2017   Bilateral carotid artery occlusion 01/29/2017   Neuropathy at site of breast surgery 01/28/2017   Prediabetes 07/24/2015   GERD (gastroesophageal reflux disease) 07/20/2015   Vitiligo 07/07/2012   PLANTAR FASCIITIS 06/16/2010   Tobacco dependence due to cigarettes 06/14/2009   Hyperlipidemia 11/10/2008   Angioedema 01/09/2008   Carotid stenosis 06/17/2007   Essential hypertension 06/12/2007   Personal history of malignant neoplasm of breast 02/25/2007   History of peripheral vascular disease 02/21/2007    is allergic to yellow dyes (non-tartrazine), ace inhibitors, angiotensin receptor blockers, and lotensin [benazepril hcl].  MEDICAL HISTORY: Past Medical History:  Diagnosis Date   Absence of menstruation    amenorrhea   Angioneurotic edema not elsewhere classified    due to ACE-I   Breast cancer (HCC)    Breast disorder    breast cancer 2007   Cancer [facility]) 2007   Left Breast Cancer   Carotid stenosis    Carotid US  (8/15):  RICA 40-59%; LICA 40-59% >>> FU 1 year   Chronic kidney disease    CKD3   GERD (gastroesophageal reflux disease)    H/O: rheumatic fever    History of breast cancer 07/13/2015   HLD (hyperlipidemia)    Occlusion and stenosis of carotid artery without mention of cerebral infarction    Other abnormal glucose    fasting  hyperglycemia   Peripheral vascular disease    Carotid Stenosis   Personal history of malignant neoplasm of breast    Personal history of radiation therapy 2007   Pneumonia    Pre-diabetes    Unspecified essential hypertension     SURGICAL HISTORY: Past Surgical History:  Procedure Laterality Date   APPENDECTOMY  1972   In [place]    BREAST BIOPSY Left 12/06/2005   malignant   BREAST BIOPSY Left 08/20/2023   US  LT BREAST BX W LOC DEV 1ST LESION IMG BX SPEC US  GUIDE 08/20/2023 [facility] MAMMO[facility]APHY   BREAST LUMPECTOMY Left 01/08/2006   COLONOSCOPY  2005   negative; Dr Kristie   COLONOSCOPY N/A 09/02/2015   Procedure: COLONOSCOPY;  Surgeon: Margo LITTIE Haddock, MD;  Location: [facility];  Service: Endoscopy;  Laterality: N/A;  1215-moved to 1130 Ginger to notify pt   MASTECTOMY W/ SENTINEL NODE BIOPSY Left 01/21/2024   Procedure: MASTECTOMY WITH SENTINEL LYMPH NODE BIOPSY;  Surgeon: Vanderbilt Ned, MD;  Location: [facility] OR;  Service: General;  Laterality: Left;   PORT-A-CATH REMOVAL Right 03/18/2024   Procedure: REMOVAL PORT-A-CATH;  Surgeon: Vanderbilt Ned, MD;  Location: [facility];  Service: General;  Laterality: Right;   PORTACATH PLACEMENT N/A 09/18/2023   Procedure: INSERTION, TUNNELED CENTRAL VENOUS DEVICE, WITH PORT;  Surgeon: Vanderbilt Ned, MD;  Location: [facility] OR;  Service: General;  Laterality: N/A;  SOCIAL HISTORY: Social History   Socioeconomic History   Marital status: Single    Spouse name: Not on file   Number of children: 0   Years of education: Not on file   Highest education level: High school graduate  Occupational History   Not on file  Tobacco Use   Smoking status: Former    Current packs/day: 0.50    Average packs/day: 0.5 packs/day for 48.0 years (24.0 ttl pk-yrs)    Types: Cigarettes    Passive exposure: Current   Smokeless tobacco: Never   Tobacco comments:    Smokes daily.    Vaping Use   Vaping status: Never Used  Substance and  Sexual Activity   Alcohol use: Yes    Alcohol/week: 1.0 standard drink of alcohol    Types: 1 Standard drinks or equivalent per week    Comment: occasionally   Drug use: No   Sexual activity: Yes    Birth control/protection: Post-menopausal  Other Topics Concern   Not on file  Social History Narrative   Single, no children.   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    Social Drivers of Health   Tobacco Use: Medium Risk (03/18/2024)   Patient History    Smoking Tobacco Use: Former    Smokeless Tobacco Use: Never    Passive Exposure: Current  Physicist, Medical Strain: Low Risk (11/25/2023)   Overall Financial Resource Strain (CARDIA)    Difficulty of Paying Living Expenses: Not very hard  Food Insecurity: No Food Insecurity (01/21/2024)   Epic    Worried About Programme Researcher, Broadcasting/film/video in the Last Year: Never true    Ran Out of Food in the Last Year: Never true  Transportation Needs: No Transportation Needs (01/21/2024)   Epic    Lack of Transportation (Medical): No    Lack of Transportation (Non-Medical): No  Physical Activity: Sufficiently Active (05/31/2023)   Exercise Vital Sign    Days of Exercise per Week: 5 days    Minutes of Exercise per Session: 60 min  Stress: No Stress Concern Present (05/31/2023)   [employer] of Occupational Health - Occupational Stress Questionnaire    Feeling of Stress : Not at all  Social Connections: Socially Isolated (01/21/2024)   Social Connection and Isolation Panel    Frequency of Communication with Friends and Family: Twice a week    Frequency of Social Gatherings with Friends and Family: Once a week    Attends Religious Services: Never    Database Administrator or Organizations: No    Attends Banker Meetings: Never    Marital Status: Never married  Intimate Partner Violence: Not At Risk (01/21/2024)   Epic    Fear of Current or Ex-Partner: No    Emotionally Abused: No    Physically Abused: No    Sexually  Abused: No  Depression (PHQ2-9): Low Risk (01/01/2024)   Depression (PHQ2-9)    PHQ-2 Score: 0  Alcohol Screen: Low Risk (11/25/2023)   Alcohol Screen    Last Alcohol Screening Score (AUDIT): 1  Housing: Low Risk (01/21/2024)   Epic    Unable to Pay for Housing in the Last Year: No    Number of Times Moved in the Last Year: 1    Homeless in the Last Year: No  Utilities: Not At Risk (01/21/2024)   Epic    Threatened with loss of utilities: No  Health Literacy: Adequate Health Literacy (05/31/2023)   B1300 Health Literacy  Frequency of need for help with medical instructions: Never    FAMILY HISTORY: Family History  Problem Relation Age of Onset   Breast cancer Sister 69   Hypertension Other        some of Pts brothers and sisters   Heart attack Neg Hx    Stroke Neg Hx    Diabetes Neg Hx    Heart disease Neg Hx     Review of Systems  Constitutional:  Negative for appetite change, chills, fatigue, fever and unexpected weight change.  HENT:   Negative for hearing loss, lump/mass and trouble swallowing.   Eyes:  Negative for eye problems and icterus.  Respiratory:  Negative for chest tightness, cough and shortness of breath.   Cardiovascular:  Negative for chest pain, leg swelling and palpitations.  Gastrointestinal:  Negative for abdominal distention, abdominal pain, constipation, diarrhea, nausea and vomiting.  Endocrine: Negative for hot flashes.  Genitourinary:  Negative for difficulty urinating.   Musculoskeletal:  Negative for arthralgias.  Skin:  Negative for itching and rash.  Neurological:  Negative for dizziness, extremity weakness, headaches and numbness.  Hematological:  Negative for adenopathy. Does not bruise/bleed easily.  Psychiatric/Behavioral:  Negative for depression. The patient is not nervous/anxious.       PHYSICAL EXAMINATION   Vitals:   03/26/24 1044  BP: (!) 136/49  Pulse: (!) 58  Resp: 16  Temp: 98.1 F (36.7 C)  SpO2: 98%     She  appears well.  Large scab, no evidence of infection, left mastectomy site appears healed well otherwise.  LABORATORY DATA:  CBC    Component Value Date/Time   WBC 5.3 01/16/2024 1500   RBC 3.93 01/16/2024 1500   HGB 11.2 (L) 01/16/2024 1500   HGB 9.5 (L) 12/03/2023 1239   HGB 13.3 06/27/2020 1354   HGB 12.4 05/07/2011 1356   HCT 35.6 (L) 01/16/2024 1500   HCT 40.3 06/27/2020 1354   HCT 37.8 05/07/2011 1356   PLT 211 01/16/2024 1500   PLT 344 12/03/2023 1239   PLT 287 06/27/2020 1354   MCV 90.6 01/16/2024 1500   MCV 85 06/27/2020 1354   MCV 84.3 05/07/2011 1356   MCH 28.5 01/16/2024 1500   MCHC 31.5 01/16/2024 1500   RDW 13.9 01/16/2024 1500   RDW 13.1 06/27/2020 1354   RDW 12.9 05/07/2011 1356   LYMPHSABS 1.8 12/03/2023 1239   LYMPHSABS 4.4 (H) 06/27/2020 1354   LYMPHSABS 5.1 (H) 05/07/2011 1356   MONOABS 0.5 12/03/2023 1239   MONOABS 0.5 05/07/2011 1356   EOSABS 0.3 12/03/2023 1239   EOSABS 0.1 06/27/2020 1354   BASOSABS 0.0 12/03/2023 1239   BASOSABS 0.0 06/27/2020 1354   BASOSABS 0.1 05/07/2011 1356    CMP     Component Value Date/Time   NA 139 03/16/2024 1003   NA 136 06/27/2020 1354   K 4.3 03/16/2024 1003   CL 103 03/16/2024 1003   CO2 25 03/16/2024 1003   GLUCOSE 97 03/16/2024 1003   BUN 29 (H) 03/16/2024 1003   BUN 15 06/27/2020 1354   CREATININE 1.43 (H) 03/16/2024 1003   CREATININE 1.48 (H) 12/03/2023 1239   CALCIUM  8.4 (L) 03/16/2024 1003   PROT 6.8 12/03/2023 1239   PROT 7.5 01/31/2021 1027   ALBUMIN  3.6 12/03/2023 1239   ALBUMIN  4.3 01/31/2021 1027   AST 17 12/03/2023 1239   ALT 14 12/03/2023 1239   ALKPHOS 64 12/03/2023 1239   BILITOT 0.4 12/03/2023 1239   GFRNONAA 38 (L) 03/16/2024 1003  GFRNONAA 36 (L) 12/03/2023 1239     ASSESSMENT and THERAPY PLAN:   Assessment and Plan Assessment & Plan  Malignant neoplasm of lower-inner quadrant of left breast, estrogen receptor negative Post-surgical status with RCB -II. No indication for  adj post mastectomy radiation - Given triple neg biology, we discussed adj capecitabine. - Ordered capecitabine (Xeloda), three tablets in the morning and three tablets in the evening, two weeks on, one week off, for six to eight cycles. - Provided instructions regarding medication delivery and advised she will receive a call with details. - Prescribed antiemetic medications to be administered with capecitabine. - Ordered baseline blood work prior to chemotherapy initiation. - Scheduled follow-up appointment in approximately five weeks. - Instructed to notify the clinic if she does not receive a call regarding medication delivery within one to two weeks.  All questions were answered. The patient knows to call the clinic with any problems, questions or concerns. We can certainly see the patient much sooner if necessary.  Total encounter time:30 minutes*in face-to-face visit time, chart review, lab review, care coordination, order entry, and documentation of the encounter time.  *Total Encounter Time as defined by the Centers for Medicare and Medicaid Services includes, in addition to the face-to-face time of a patient visit (documented in the note above) non-face-to-face time: obtaining and reviewing outside history, ordering and reviewing medications, tests or procedures, care coordination (communications with other health care professionals or caregivers) and documentation in the medical record.

## 2024-03-26 NOTE — Telephone Encounter (Signed)
-----   Message from Amber Stalls, MD sent at 03/26/2024  4:12 PM EST ----- Regarding: RE: capecitabine and DPYD testing Oh really.  Thanks for letting me know.  Leita, can u call her that she has to come for this.  Thanks, ----- Message ----- From: Lucila Norleen LABOR, RPH-CPP Sent: 03/26/2024  12:57 PM EST To: Amber Stalls, MD; Norleen LABOR Lucila, RPH-CPP# Subject: capecitabine and DPYD testing                   Hi Dr. Stalls,  I see the new capecitabine prescription for this patient. Can you please make sure DPYD testing has been ordered if not already. This is now a boxed warning and recommended by NCCN/ASCO for all new capecitabine starts. Ideally want this test back before starting capecitabine if possible. Thank you.  We will work on doing the patient education early next week.  Regards, Norleen

## 2024-03-27 ENCOUNTER — Telehealth: Payer: Self-pay | Admitting: Hematology and Oncology

## 2024-03-27 NOTE — Telephone Encounter (Signed)
 I LEFT VOICEMAIL FOR PATIENT REGARDING APPOINTMENTS ON 05/06/2024.

## 2024-03-27 NOTE — Telephone Encounter (Signed)
 Oral Oncology Patient Advocate Encounter  I spoke with patient, she will pay [facility] cash price $29.40 foCapecitabine . Patient would like to pickup medication from pharmacy when she is at the hospital for an appt.  She Would like to speak to Norleen (pharmacist) in person Tuesday around her lab appt. Scheduling has been contacted to set this up.    Charlott Hamilton,  CPhT-Adv  she/her/hers [facility]  [facility] Pharmacy Technician Patient Advocate Specialist III [facility] Phone: [phone]  Fax: [phone] [email or web address]

## 2024-03-31 ENCOUNTER — Encounter: Payer: Self-pay | Admitting: Hematology and Oncology

## 2024-03-31 ENCOUNTER — Inpatient Hospital Stay: Admitting: Pharmacist

## 2024-03-31 ENCOUNTER — Other Ambulatory Visit (HOSPITAL_COMMUNITY): Payer: Self-pay

## 2024-03-31 ENCOUNTER — Inpatient Hospital Stay

## 2024-03-31 ENCOUNTER — Other Ambulatory Visit: Payer: Self-pay

## 2024-03-31 VITALS — BP 144/51 | HR 55 | Temp 97.9°F | Resp 18 | Ht 64.0 in | Wt 155.6 lb

## 2024-03-31 DIAGNOSIS — C50312 Malignant neoplasm of lower-inner quadrant of left female breast: Secondary | ICD-10-CM | POA: Diagnosis not present

## 2024-03-31 NOTE — Progress Notes (Signed)
 Specialty Pharmacy Initial Fill Coordination Note  [name] is a 78 y.o. female contacted today regarding refills of specialty medication(s) Capecitabine  (XELODA ) .  Patient requested Delivery  on 04/06/24  to verified address [address] [place] [address] (mother's address)   Medication will be filled on 04/03/2024.   Patient is aware of $29.40 [facility] Cash Price

## 2024-03-31 NOTE — Progress Notes (Signed)
 Patient counseled in clinic in-person visit note on 03/31/24

## 2024-03-31 NOTE — Progress Notes (Signed)
 " [facility]        Telephone: [phone]?Fax: [phone]   Oncology Clinical Pharmacist Practitioner Initial Assessment   [name] is a 79 y.o. female with a diagnosis of breast cancer. They were contacted today via in-person visit.  Indication/Regimen [name]  (Xeloda ) is being used appropriately for treatment of breast cancer in the adjuvant setting by Dr. Amber [name].      Wt Readings from Last 1 Encounters:  03/26/24 158 lb 11.2 oz (72 kg)    Estimated body surface area is 1.8 meters squared as calculated from the following:   Height as of 03/18/24: 5' 4 (1.626 m).   Weight as of 03/26/24: 158 lb 11.2 oz (72 kg).  The dosing regimen is 3 tablets (1500 mg) by mouth in the morning and 3 tablets (1500 mg) in the evening with food on days 1 to 14 of a 21-day cycle. This is being given  monotherapy. It is planned to continue until six months in the adjuvant setting per the CREATE-X trial data. Prescription dose and frequency assessed for appropriateness. The treatment goal is: Curative.  [name] has agreed to treatment which is documented in physician note on 03/26/24. Counseled [name] on administration, dosing, side effects, monitoring, drug-food interactions, safe handling, storage, and disposal.  Dose Modifications Dr. Stalls is starting at a reduce dose of ~838 mg/m2 PO BID every 12 hours 14 out of 21 days   Access Assessment [name]  through [facility] Concerns: none Start date if known: delivery 04/06/24, tentative start 04/07/24  Adherence Assessment Reviewed importance on keeping a med schedule and plan for any missed doses Barriers to adherence identified? No  Communication and Learning Assessment [name] Barriers to learning: No barriers Preferred language: English Learning preferences: Listening   Allergies Allergies[1]  Vitals    03/26/2024    10:44 AM 03/18/2024    5:42 PM 03/18/2024    5:26 PM  Oncology Vitals  Weight 71.986 kg    Weight (lbs) 158 lbs 11 oz    BMI 27.24 kg/m2    Temp 98.1 F (36.7 C) 98 F (36.7 C)   Pulse Rate 58 58 55  BP 136/49 184/75 181/65  Resp 16 16 15   SpO2 98 % 97 % 98 %  BSA (m2) 1.8 m2       Laboratory Data    Latest Ref Rng & Units 03/26/2024   11:00 AM 01/16/2024    3:00 PM 12/03/2023   12:39 PM  CBC EXTENDED  WBC 4.0 - 10.5 K/uL 6.5  5.3  8.7   RBC 3.87 - 5.11 MIL/uL 3.78  3.93  3.35   Hemoglobin 12.0 - 15.0 g/dL 89.7  88.7  9.5   HCT 63.9 - 46.0 % 32.4  35.6  30.5   Platelets 150 - 400 K/uL 226  211  344   NEUT# 1.7 - 7.7 K/uL 3.7   6.1   Lymph# 0.7 - 4.0 K/uL 2.3   1.8        Latest Ref Rng & Units 03/26/2024   11:00 AM 03/16/2024   10:03 AM 01/16/2024    3:00 PM  CMP  Glucose 70 - 99 mg/dL 891  97  893   BUN 8 - 23 mg/dL 36  29  35   Creatinine 0.44 - 1.00 mg/dL 8.27  8.56  8.42   Sodium 135 - 145 mmol/L 140  139  136   Potassium  3.5 - 5.1 mmol/L 4.2  4.3  4.2   Chloride 98 - 111 mmol/L 106  103  104   CO2 22 - 32 mmol/L 22  25  20    Calcium  8.9 - 10.3 mg/dL 9.2  8.4  9.2   Total Protein 6.5 - 8.1 g/dL 7.4     Total Bilirubin 0.0 - 1.2 mg/dL 0.4     Alkaline Phos 38 - 126 U/L 81     AST 15 - 41 U/L 15     ALT 0 - 44 U/L 8      No results found for: MG No results found for: [id number]   Contraindications Contraindications were reviewed? Yes Contraindications to therapy were identified? No   Safety Precautions The following safety precautions for the use of [name]  were reviewed:  Fever: reviewed the importance of having a thermometer and the Centers for Disease Control and Prevention (CDC) definition of fever which is 100.75F (38C) or higher. [name] should call 24/7 triage at [phone] if experiencing a fever or any other symptoms Decreased white blood cells (WBCs) and increased risk for infection Decreased hemoglobin, part of the red blood cells  that carry iron  and oxygen Diarrhea Mouth irritation or sores Pain or discomfort in hands and/or feet Changes in liver function Nausea or vomiting Fatigue Abdominal pain Decreased platelet count and increased risk of bleeding Interactions with warfarin Cardiotoxicity Kidney injury Dehydration Alopecia Handling body fluids and waste Pregnancy, sexual activity, and contraception Storage and Handling To be taken within 30 minutes of a meal Missed doses  Medication Reconciliation Current Outpatient Medications  Medication Sig Dispense Refill   acetaminophen  (TYLENOL ) 500 MG tablet Take 1,000 mg by mouth 2 (two) times daily as needed for headache or fever (pain).     amLODipine  (NORVASC ) 10 MG tablet Take 1 tablet (10 mg total) by mouth daily. For high blood pressure 90 tablet 2   atorvastatin  (LIPITOR) 40 MG tablet TAKE 1 TABLET BY MOUTH EVERY DAY 90 tablet 1   buPROPion  (WELLBUTRIN  XL) 150 MG 24 hr tablet TAKE 1 TABLET (150 MG TOTAL) BY MOUTH IN THE MORNING DAILY. 90 tablet 2   [name]  (XELODA ) 500 MG tablet Take 3 tablets (1,500 mg total) by mouth 2 (two) times daily after a meal. Take on days 1-14. Repeat every 21 days. 84 tablet 5   chlorthalidone  (HYGROTON ) 25 MG tablet Take 1 tablet (25 mg total) by mouth daily. 90 tablet 3   Cyanocobalamin  (VITAMIN B-12 PO) Take 1 tablet by mouth daily.     EPINEPHrine  0.3 mg/0.3 mL IJ SOAJ injection Inject 0.3 mg into the muscle as needed for anaphylaxis. 1 each 1   furosemide  (LASIX ) 40 MG tablet Take 40 mg by mouth daily.     hydrALAZINE  (APRESOLINE ) 100 MG tablet Take 1 tablet (100 mg total) by mouth 3 (three) times daily. 90 tablet 5   isosorbide  mononitrate (IMDUR ) 30 MG 24 hr tablet Take 1 tablet (30 mg total) by mouth daily. 90 tablet 1   labetalol  (NORMODYNE ) 200 MG tablet Take 2 tablets (400 mg total) by mouth 2 (two) times daily. 120 tablet 5   lidocaine -prilocaine  (EMLA ) cream Apply 1 Application topically once as needed  (numbing, port access).     omeprazole  (PRILOSEC) 40 MG capsule TAKE 1 CAPSULE BY MOUTH EVERY DAY ([name] taking differently: Take 40 mg by mouth daily as needed (acid reflux).) 90 capsule 0   ondansetron  (ZOFRAN ) 8 MG tablet Take 1 tablet (8 mg total) by  mouth every 8 (eight) hours as needed for nausea or vomiting. 30 tablet 1   oxyCODONE  (OXY IR/ROXICODONE ) 5 MG immediate release tablet Take 1 tablet (5 mg total) by mouth every 6 (six) hours as needed for severe pain (pain score 7-10). 15 tablet 0   prochlorperazine  (COMPAZINE ) 10 MG tablet Take 1 tablet (10 mg total) by mouth every 6 (six) hours as needed for nausea or vomiting. 30 tablet 1   No current facility-administered medications for this visit.    Medication reconciliation is based on the [name]'s most recent medication list in the electronic medical record (EMR) including herbal products and OTC medications.   The [name]'s medication list was reviewed today with the [name]? Yes   Drug-drug interactions (DDIs) DDIs were evaluated? Yes Significant DDIs identified? Has been tolerating medications. Sees PCP for blood pressure  Drug-Food Interactions Drug-food interactions were evaluated? Yes Drug-food interactions identified? No   Follow-up Plan  [name] education handout given to [name] Start [name]  3 tablets (1500 mg) by mouth in the morning and 3 tablets (1500 mg) in the evening with food on days 1 to 14 of a 21-day cycle. Will start tentatively on 04/07/24. Will ship to mom's address where she is living currently. Mom passed over the summer Monitor for side effects DPYD lab sent out today per Dr. Loretha, results will be sent directly to Dr. Loretha. Reviewed reasoning for test with [name] today. Will forward note to Dr. Loretha regarding toe pain. She may want further workup for gout through PCP. Reviewed this with [name]. Distress thermometer not done as [name] has had prior treatment at [facility].   Labs, Dr.  Loretha visit on 05/06/24 Will add labs, pharmacy clinic visit in 8 weeks Nataliyah Packham can follow up with clinical pharmacy as deemed necessary by Dr. Amber Iruku going forward   [name] participated in the discussion, expressed understanding, and voiced agreement with the above plan. [name]. The [name] was advised to contact the clinic at [phone] with any questions or concerns prior to her return visit.   I spent 30 minutes assessing the [name].  Arlana Canizales A. Lucila, PharmD, BCOP, CPP  Norleen DELENA Lucila, RPH-CPP, 03/31/2024 11:00 AM  **Disclaimer: This note was dictated with voice recognition software. Similar sounding words can inadvertently be transcribed and this note may contain transcription errors which may not have been corrected upon publication of note.**      [1]  Allergies Allergen Reactions   Yellow Dyes 6, 10, And 11 Swelling    12/14 hives  & facial swelling with Lemon Kool Aid   Ace Inhibitors Swelling    Facial swelling, angioedema  Because of a history of documented adverse serious drug reaction;Medi Alert bracelet is recommended per MD   Angiotensin Receptor Blockers Other (See Comments)    Unknown reaction Can not take ARBs - per MD    Lotensin [Benazepril Hcl] Swelling    Facial swelling, angioedema  Because of a history of documented adverse serious drug reaction;Medi Alert bracelet is recommended per MD   "

## 2024-04-03 ENCOUNTER — Other Ambulatory Visit: Payer: Self-pay

## 2024-04-03 NOTE — Progress Notes (Signed)
 Beryl Hornberger                                          MRN: [id number]   04/03/2024   The [facility] Quality Team Specialist reviewed this patient medical record for the purposes of chart review for care gap closure. The following were reviewed: chart review for care gap closure-controlling blood pressure.    [facility] Quality Team

## 2024-04-08 ENCOUNTER — Telehealth: Payer: Self-pay | Admitting: *Deleted

## 2024-04-08 NOTE — Telephone Encounter (Signed)
 Called to f/u on complaints pt had while speaking to pharmacists. Pt stated that symptoms has subsided and she is better. She also stated that she started her Xeloda  today 12/31 because medication was sent to old address. Address was changed in pt chart to reflect new address.

## 2024-04-08 NOTE — Telephone Encounter (Signed)
-----   Message from Amber Stalls, MD sent at 04/07/2024  8:44 AM EST ----- Nursing team,  Can you call patient and find out more about what has happened since this message.  Thanks, ----- Message ----- From: Lucila Norleen LABOR, RPH-CPP Sent: 03/31/2024  12:04 PM EST To: Amber Stalls, MD  Dr. Stalls, I saw [name] for capecitabine  education today. She was describing some toe pain and she thought it could be the start of gout. I don't see that she has been on allopurinol before and was not sure if this is something you want to manage but I explained her PCP may be best to workup. She is using topicals and systemic OTC pain meds for now until she hears back from you or her PCP. Please let me know how I can assist or if questions. She sees you again on 05/06/24 and me on 05/26/24. Thank you.  Regards, Norleen

## 2024-04-10 LAB — DPYD GENOTYPING: DPYD Metabolic Activity: NORMAL

## 2024-04-14 ENCOUNTER — Other Ambulatory Visit: Payer: Self-pay | Admitting: Pharmacist

## 2024-04-14 ENCOUNTER — Ambulatory Visit: Payer: Self-pay | Admitting: Hematology and Oncology

## 2024-04-17 ENCOUNTER — Other Ambulatory Visit: Payer: Self-pay

## 2024-04-19 ENCOUNTER — Encounter: Payer: Self-pay | Admitting: Hematology and Oncology

## 2024-04-20 ENCOUNTER — Other Ambulatory Visit: Payer: Self-pay

## 2024-04-21 ENCOUNTER — Encounter: Payer: Self-pay | Admitting: Hematology and Oncology

## 2024-04-21 ENCOUNTER — Other Ambulatory Visit: Payer: Self-pay

## 2024-04-21 NOTE — Progress Notes (Signed)
 Specialty Pharmacy Ongoing Clinical Assessment Note  [name] is a 79 y.o. female who is being followed by the specialty pharmacy service for RxSp Oncology   Patient's specialty medication(s) reviewed today: Capecitabine  (XELODA )   Missed doses in the last 4 weeks: 1   Patient/Caregiver did not have any additional questions or concerns.   Therapeutic benefit summary: Unable to assess   Adverse events/side effects summary: No adverse events/side effects   Patient's therapy is appropriate to: Continue    Goals Addressed             This Visit's Progress    Maintain optimal adherence to therapy       Patient is unable to be assessed as therapy was recently initiated. Patient will maintain adherence          Follow up: 3 months  [name] M [facility] Specialty Pharmacist

## 2024-04-21 NOTE — Progress Notes (Signed)
 Specialty Pharmacy Refill Coordination Note  [name] is a 78 y.o. female contacted today regarding refills of specialty medication(s) Capecitabine  (XELODA )   Patient requested Delivery   Delivery date: 04/24/24   Verified address: [address] [place] [address] (mother's address)   Medication will be filled on: 04/23/24  Patient aware of copay and will call back on 1.14.26 to update payment.

## 2024-04-22 ENCOUNTER — Other Ambulatory Visit (HOSPITAL_COMMUNITY): Payer: Self-pay

## 2024-04-22 ENCOUNTER — Other Ambulatory Visit: Payer: Self-pay | Admitting: Internal Medicine

## 2024-04-22 ENCOUNTER — Other Ambulatory Visit: Payer: Self-pay

## 2024-04-23 ENCOUNTER — Other Ambulatory Visit (HOSPITAL_COMMUNITY): Payer: Self-pay

## 2024-04-23 ENCOUNTER — Other Ambulatory Visit: Payer: Self-pay

## 2024-04-25 ENCOUNTER — Encounter: Payer: Self-pay | Admitting: Hematology and Oncology

## 2024-04-27 ENCOUNTER — Encounter: Payer: Self-pay | Admitting: Internal Medicine

## 2024-04-27 NOTE — Progress Notes (Unsigned)
 "     Subjective:    Patient ID: [name], female    DOB: 06-Jun-1945, 79 y.o.   MRN: [id number]     HPI [name] is here for follow up of her chronic medical problems.  Ck A1c, vitamin d , B12 - ? Taking B12  Medications and allergies reviewed with patient and updated if appropriate.  Medications Ordered Prior to Encounter[1]   Review of Systems     Objective:  There were no vitals filed for this visit. BP Readings from Last 3 Encounters:  03/31/24 (!) 144/51  03/26/24 (!) 136/49  03/18/24 (!) 184/75   Wt Readings from Last 3 Encounters:  03/31/24 155 lb 9.6 oz (70.6 kg)  03/26/24 158 lb 11.2 oz (72 kg)  03/18/24 152 lb 1.9 oz (69 kg)   There is no height or weight on file to calculate BMI.    Physical Exam     Lab Results  Component Value Date   WBC 6.5 03/26/2024   HGB 10.2 (L) 03/26/2024   HCT 32.4 (L) 03/26/2024   PLT 226 03/26/2024   GLUCOSE 108 (H) 03/26/2024   CHOL 157 12/05/2021   TRIG 104.0 12/05/2021   HDL 50.70 12/05/2021   LDLDIRECT 141.8 07/31/2012   LDLCALC 86 12/05/2021   ALT 8 03/26/2024   AST 15 03/26/2024   NA 140 03/26/2024   K 4.2 03/26/2024   CL 106 03/26/2024   CREATININE 1.72 (H) 03/26/2024   BUN 36 (H) 03/26/2024   CO2 22 03/26/2024   TSH 1.23 05/04/2022   HGBA1C 5.9 05/04/2022     Assessment & Plan:    See Problem List for Assessment and Plan of chronic medical problems.       [1]  Current Outpatient Medications on File Prior to Visit  Medication Sig Dispense Refill   acetaminophen  (TYLENOL ) 500 MG tablet Take 1,000 mg by mouth 2 (two) times daily as needed for headache or fever (pain).     amLODipine  (NORVASC ) 10 MG tablet Take 1 tablet (10 mg total) by mouth daily. For high blood pressure 90 tablet 2   atorvastatin  (LIPITOR) 40 MG tablet TAKE 1 TABLET BY MOUTH EVERY DAY 90 tablet 1   buPROPion  (WELLBUTRIN  XL) 150 MG 24 hr tablet TAKE 1 TABLET (150 MG TOTAL) BY MOUTH IN THE MORNING DAILY. 90 tablet 2    capecitabine  (XELODA ) 500 MG tablet Take 3 tablets (1,500 mg total) by mouth 2 (two) times daily after a meal. Take on days 1-14. Repeat every 21 days. (Patient not taking: Reported on 03/31/2024) 84 tablet 5   chlorthalidone  (HYGROTON ) 25 MG tablet Take 1 tablet (25 mg total) by mouth daily. 90 tablet 3   Cyanocobalamin  (VITAMIN B-12 PO) Take 1 tablet by mouth daily.     EPINEPHrine  0.3 mg/0.3 mL IJ SOAJ injection Inject 0.3 mg into the muscle as needed for anaphylaxis. 1 each 1   furosemide  (LASIX ) 40 MG tablet Take 40 mg by mouth daily.     hydrALAZINE  (APRESOLINE ) 100 MG tablet TAKE 1 TABLET BY MOUTH 3 TIMES DAILY. 270 tablet 1   isosorbide  mononitrate (IMDUR ) 30 MG 24 hr tablet Take 1 tablet (30 mg total) by mouth daily. 90 tablet 1   labetalol  (NORMODYNE ) 200 MG tablet Take 2 tablets (400 mg total) by mouth 2 (two) times daily. 120 tablet 5   omeprazole  (PRILOSEC) 40 MG capsule TAKE 1 CAPSULE BY MOUTH EVERY DAY 90 capsule 0   oxyCODONE  (OXY IR/ROXICODONE ) 5 MG immediate release  tablet Take 1 tablet (5 mg total) by mouth every 6 (six) hours as needed for severe pain (pain score 7-10). 15 tablet 0   No current facility-administered medications on file prior to visit.   "

## 2024-04-28 ENCOUNTER — Ambulatory Visit: Admitting: Internal Medicine

## 2024-04-28 VITALS — BP 130/60 | HR 52 | Temp 98.0°F | Ht 64.0 in | Wt 157.0 lb

## 2024-04-28 DIAGNOSIS — K219 Gastro-esophageal reflux disease without esophagitis: Secondary | ICD-10-CM

## 2024-04-28 DIAGNOSIS — Z87891 Personal history of nicotine dependence: Secondary | ICD-10-CM

## 2024-04-28 DIAGNOSIS — I10 Essential (primary) hypertension: Secondary | ICD-10-CM | POA: Diagnosis not present

## 2024-04-28 DIAGNOSIS — E7800 Pure hypercholesterolemia, unspecified: Secondary | ICD-10-CM | POA: Diagnosis not present

## 2024-04-28 DIAGNOSIS — R7989 Other specified abnormal findings of blood chemistry: Secondary | ICD-10-CM

## 2024-04-28 DIAGNOSIS — R7303 Prediabetes: Secondary | ICD-10-CM | POA: Diagnosis not present

## 2024-04-28 DIAGNOSIS — E559 Vitamin D deficiency, unspecified: Secondary | ICD-10-CM | POA: Diagnosis not present

## 2024-04-28 DIAGNOSIS — N1832 Chronic kidney disease, stage 3b: Secondary | ICD-10-CM

## 2024-04-28 DIAGNOSIS — C50312 Malignant neoplasm of lower-inner quadrant of left female breast: Secondary | ICD-10-CM | POA: Diagnosis not present

## 2024-04-28 DIAGNOSIS — I5032 Chronic diastolic (congestive) heart failure: Secondary | ICD-10-CM

## 2024-04-28 DIAGNOSIS — Z171 Estrogen receptor negative status [ER-]: Secondary | ICD-10-CM

## 2024-04-28 MED ORDER — BUPROPION HCL ER (XL) 150 MG PO TB24: ORAL_TABLET | ORAL | 2 refills | Status: AC

## 2024-04-28 MED ORDER — OMEPRAZOLE 40 MG PO CPDR: 40.0000 mg | DELAYED_RELEASE_CAPSULE | Freq: Every day | ORAL | Status: AC | PRN

## 2024-04-28 NOTE — Assessment & Plan Note (Signed)
 He has successfully quit smoking Bupropion  has been helpful and reduces craving-continue bupropion  150 mg daily in the morning Stressed continued cessation

## 2024-04-28 NOTE — Assessment & Plan Note (Signed)
 Chronic Lab Results  Component Value Date   VITAMINB12 4,316 (H) 11/21/2023

## 2024-04-28 NOTE — Patient Instructions (Addendum)
" ° ° ° ° ° ° °  Medications changes include :   None     Return in about 6 months (around 10/26/2024) for follow up.  "

## 2024-04-28 NOTE — Assessment & Plan Note (Addendum)
 Chronic Continue vitamin D  supplementation Will check vitamin D  level at her next visit since he is not getting blood work done here today

## 2024-04-28 NOTE — Assessment & Plan Note (Signed)
 Chronic Euvolemic Stressed good blood pressure control Continue furosemide  40 mg daily

## 2024-04-28 NOTE — Assessment & Plan Note (Signed)
Chronic Cholesterol adequately controlled Continue atorvastatin 40 mg daily Encouraged regular exercise, heart healthy diet 

## 2024-04-28 NOTE — Assessment & Plan Note (Addendum)
 Chronic Lab Results  Component Value Date   HGBA1C 5.9 05/04/2022     Low sugar/carbohydrate diet

## 2024-04-28 NOTE — Assessment & Plan Note (Addendum)
Chronic ?GERD controlled ?Continue omeprazole 40 mg daily prn ? ?

## 2024-04-28 NOTE — Assessment & Plan Note (Addendum)
 Chronic Blood pressure controlled Currently taking amlodipine  10 mg daily, isosorbide  mononitrate 30 mg daily, labetalol  400 mg twice daily and hydralazine  100 mg 3 times daily, chlorthalidone  25 mg daily

## 2024-04-28 NOTE — Assessment & Plan Note (Signed)
 Chronic Stage 3b Blood pressure not controlled Will adjust BP medication Stressed good water  intake Avoid all NSAIDs

## 2024-04-29 ENCOUNTER — Other Ambulatory Visit: Payer: Self-pay

## 2024-05-06 ENCOUNTER — Inpatient Hospital Stay: Payer: Self-pay | Admitting: Hematology and Oncology

## 2024-05-06 ENCOUNTER — Encounter: Payer: Self-pay | Admitting: Hematology and Oncology

## 2024-05-06 ENCOUNTER — Inpatient Hospital Stay: Payer: Self-pay | Attending: Hematology and Oncology

## 2024-05-06 VITALS — BP 149/58 | HR 58 | Temp 97.9°F | Resp 17 | Ht 64.0 in | Wt 156.8 lb

## 2024-05-06 DIAGNOSIS — Z171 Estrogen receptor negative status [ER-]: Secondary | ICD-10-CM | POA: Diagnosis not present

## 2024-05-06 DIAGNOSIS — C50312 Malignant neoplasm of lower-inner quadrant of left female breast: Secondary | ICD-10-CM | POA: Diagnosis not present

## 2024-05-06 LAB — CBC WITH DIFFERENTIAL/PLATELET
Abs Immature Granulocytes: 0.01 10*3/uL (ref 0.00–0.07)
Basophils Absolute: 0 10*3/uL (ref 0.0–0.1)
Basophils Relative: 0 %
Eosinophils Absolute: 0.1 10*3/uL (ref 0.0–0.5)
Eosinophils Relative: 1 %
HCT: 29.8 % — ABNORMAL LOW (ref 36.0–46.0)
Hemoglobin: 9.5 g/dL — ABNORMAL LOW (ref 12.0–15.0)
Immature Granulocytes: 0 %
Lymphocytes Relative: 42 %
Lymphs Abs: 2.5 10*3/uL (ref 0.7–4.0)
MCH: 27.9 pg (ref 26.0–34.0)
MCHC: 31.9 g/dL (ref 30.0–36.0)
MCV: 87.6 fL (ref 80.0–100.0)
Monocytes Absolute: 0.3 10*3/uL (ref 0.1–1.0)
Monocytes Relative: 5 %
Neutro Abs: 3.1 10*3/uL (ref 1.7–7.7)
Neutrophils Relative %: 52 %
Platelets: 159 10*3/uL (ref 150–400)
RBC: 3.4 MIL/uL — ABNORMAL LOW (ref 3.87–5.11)
RDW: 17 % — ABNORMAL HIGH (ref 11.5–15.5)
WBC: 5.9 10*3/uL (ref 4.0–10.5)
nRBC: 0 % (ref 0.0–0.2)

## 2024-05-06 LAB — CMP (CANCER CENTER ONLY)
ALT: 6 U/L (ref 0–44)
AST: 15 U/L (ref 15–41)
Albumin: 4.2 g/dL (ref 3.5–5.0)
Alkaline Phosphatase: 83 U/L (ref 38–126)
Anion gap: 13 (ref 5–15)
BUN: 30 mg/dL — ABNORMAL HIGH (ref 8–23)
CO2: 23 mmol/L (ref 22–32)
Calcium: 9.2 mg/dL (ref 8.9–10.3)
Chloride: 103 mmol/L (ref 98–111)
Creatinine: 1.77 mg/dL — ABNORMAL HIGH (ref 0.44–1.00)
GFR, Estimated: 29 mL/min — ABNORMAL LOW
Glucose, Bld: 90 mg/dL (ref 70–99)
Potassium: 4.4 mmol/L (ref 3.5–5.1)
Sodium: 139 mmol/L (ref 135–145)
Total Bilirubin: 0.6 mg/dL (ref 0.0–1.2)
Total Protein: 7.3 g/dL (ref 6.5–8.1)

## 2024-05-06 NOTE — Progress Notes (Signed)
 [facility] Cancer Follow up:    [name], MD [address] [place] [address]   DIAGNOSIS:  Cancer Staging  Malignant neoplasm of lower-inner quadrant of left breast in female, estrogen receptor negative (HCC) Staging form: Breast, AJCC 8th Edition - Clinical stage from 08/28/2023: Stage IIB (cT2, cN0, cM0, G3, ER-, PR-, HER2-) - Signed by Loretha Ash, MD on 08/28/2023 Stage prefix: Initial diagnosis Histologic grading system: 3 grade system Laterality: Left Staged by: Pathologist and managing physician Stage used in treatment planning: Yes National guidelines used in treatment planning: Yes Type of national guideline used in treatment planning: NCCN    SUMMARY OF ONCOLOGIC HISTORY: Oncology History  Malignant neoplasm of lower-inner quadrant of left breast in female, estrogen receptor negative (HCC)  08/15/2023 Mammogram   Highly suspicious mass with associated calcifications in the left breast at 8 o'clock measuring 2.8 cm. The calcifications extend slightly outside of the mass by 0.5 cm or less. Resolution of the previously seen mass in the right breast at 12o'clock. No mammographic evidence of malignancy in the right breast.     08/20/2023 Pathology Results   Left breast needle core biopsy showed grade 3 IDC, triple negative.   08/26/2023 Initial Diagnosis   Malignant neoplasm of lower-inner quadrant of left breast in female, estrogen receptor negative (HCC)   08/28/2023 Cancer Staging   Staging form: Breast, AJCC 8th Edition - Clinical stage from 08/28/2023: Stage IIB (cT2, cN0, cM0, G3, ER-, PR-, HER2-) - Signed by Loretha Ash, MD on 08/28/2023 Stage prefix: Initial diagnosis Histologic grading system: 3 grade system Laterality: Left Staged by: Pathologist and managing physician Stage used in treatment planning: Yes National guidelines used in treatment planning: Yes Type of national guideline used in treatment planning: NCCN   09/19/2023  - 11/02/2023 Chemotherapy   Patient is on Treatment Plan : BREAST TC q21d      Genetic Testing   [facility] CancerNext Panel+RNA was Negative. Report date is 09/08/2023.   The [facility] CancerNext+RNAinsight Panel includes sequencing, rearrangement analysis, and RNA analysis for the following 40 genes: APC, ATM, BAP1, BARD1, BMPR1A, BRCA1, BRCA2, BRIP1, CDH1, CDKN2A, CHEK2, FH, FLCN, MET, MLH1, MSH2, MSH6, MUTYH, NF1, NTHL1, PALB2, PMS2, PTEN, RAD51C, RAD51D, RPS20, SMAD4, STK11, TP53, TSC1, TSC2, and VHL (sequencing and deletion/duplication); AXIN2, HOXB13, MBD4, MSH3, POLD1 and POLE (sequencing only); EPCAM and GREM1 (deletion/duplication only).    03/26/2024 -  Chemotherapy   Patient is on Treatment Plan : BREAST Capecitabine  q21d       CURRENT THERAPY: Adj Xeloda .  INTERVAL HISTORY:  Discussed the use of AI scribe software for clinical note transcription with the patient, who gave verbal consent to proceed.  History of Present Illness  [name] is a 79 year old female with malignant neoplasm status post surgery, currently undergoing oral chemotherapy, who presents for hematology/oncology follow-up during active treatment.  She is in the second week of her second cycle of oral chemotherapy, taking six pills daily on a two-weeks-on, one-week-off regimen that began on May 09, 2024, following a brief delay due to medication delivery. She reports excellent tolerance to therapy, denying nausea, vomiting, diarrhea, or systemic complaints. She feels well overall and questions the necessity of the medication due to the absence of symptoms. She notes no changes in her hands and reports regular bowel movements, with her last occurring yesterday.  She continues to experience recurrent seroma at the surgical site, requiring periodic drainage by her surgeon. The most recent drainage was performed prior to  this visit. She is uncertain if the site is fully healed but states her surgeon indicated  improvement.  She is not currently working and limits driving to major highways due to poor secondary road conditions near her home.  Rest of the pertinent 10 point ROS reviewed and neg  Patient Active Problem List   Diagnosis Date Noted   Left breast cancer with T3 tumor, >5 cm in greatest dimension (HCC) 01/21/2024   History of gout 12/03/2023   Chronic diastolic heart failure (HCC) 11/29/2023   Bilateral calf pain 11/21/2023   Hypertensive emergency 11/21/2023   History of breast cancer 11/21/2023   GAD (generalized anxiety disorder) 11/21/2023   DOE (dyspnea on exertion) 11/20/2023   Port-A-Cath in place 10/31/2023   Skin abnormality 09/30/2023   Genetic testing 09/11/2023   Malignant neoplasm of lower-inner quadrant of left breast in female, estrogen receptor negative (HCC) 08/26/2023   CKD stage 3b, GFR 30-44 ml/min (HCC) 06/16/2023   Muscle cramping 07/09/2022   Weight loss 05/04/2022   Subacute cough 05/04/2022   Vitamin D  deficiency 12/04/2021   Low vitamin B12 level 12/04/2021   Clavicle enlargement 07/23/2018   Osteopenia 06/14/2018   Achilles tendinitis 01/29/2017   Bilateral carotid artery occlusion 01/29/2017   Neuropathy at site of breast surgery 01/28/2017   Prediabetes 07/24/2015   GERD (gastroesophageal reflux disease) 07/20/2015   Vitiligo 07/07/2012   PLANTAR FASCIITIS 06/16/2010   History of tobacco abuse 06/14/2009   Hypercholesterolemia 11/10/2008   Angioedema 01/09/2008   Carotid stenosis 06/17/2007   Essential hypertension 06/12/2007   Personal history of malignant neoplasm of breast 02/25/2007   History of peripheral vascular disease 02/21/2007    is allergic to yellow dyes 6, 10, and 11; ace inhibitors; angiotensin receptor blockers; and lotensin [benazepril hcl].  MEDICAL HISTORY: Past Medical History:  Diagnosis Date   Absence of menstruation    amenorrhea   Angioneurotic edema not elsewhere classified    due to ACE-I   Breast cancer  (HCC)    Breast disorder    breast cancer 2007   Cancer [facility]) 2007   Left Breast Cancer   Carotid stenosis    Carotid US  (8/15):  RICA 40-59%; LICA 40-59% >>> FU 1 year   Chronic kidney disease    CKD3   GERD (gastroesophageal reflux disease)    H/O: rheumatic fever    History of breast cancer 07/13/2015   HLD (hyperlipidemia)    Occlusion and stenosis of carotid artery without mention of cerebral infarction    Other abnormal glucose    fasting hyperglycemia   Peripheral vascular disease    Carotid Stenosis   Personal history of malignant neoplasm of breast    Personal history of radiation therapy 2007   Pneumonia    Pre-diabetes    Unspecified essential hypertension     SURGICAL HISTORY: Past Surgical History:  Procedure Laterality Date   APPENDECTOMY  1972   In [place]    BREAST BIOPSY Left 12/06/2005   malignant   BREAST BIOPSY Left 08/20/2023   US  LT BREAST BX W LOC DEV 1ST LESION IMG BX SPEC US  GUIDE 08/20/2023 [facility] MAMMOGRAPHY   BREAST LUMPECTOMY Left 01/08/2006   COLONOSCOPY  2005   negative; Dr Kristie   COLONOSCOPY N/A 09/02/2015   Procedure: COLONOSCOPY;  Surgeon: Margo LITTIE Haddock, MD;  Location: [facility];  Service: Endoscopy;  Laterality: N/A;  1215-moved to 1130 Ginger to notify pt   MASTECTOMY W/ SENTINEL NODE BIOPSY Left 01/21/2024   Procedure:  MASTECTOMY WITH SENTINEL LYMPH NODE BIOPSY;  Surgeon: Vanderbilt Ned, MD;  Location: [facility] OR;  Service: General;  Laterality: Left;   PORT-A-CATH REMOVAL Right 03/18/2024   Procedure: REMOVAL PORT-A-CATH;  Surgeon: Vanderbilt Ned, MD;  Location: [facility];  Service: General;  Laterality: Right;   PORTACATH PLACEMENT N/A 09/18/2023   Procedure: INSERTION, TUNNELED CENTRAL VENOUS DEVICE, WITH PORT;  Surgeon: Vanderbilt Ned, MD;  Location: [facility] OR;  Service: General;  Laterality: N/A;    SOCIAL HISTORY: Social History   Socioeconomic History   Marital status: Single    Spouse name: Not on file    Number of children: 0   Years of education: Not on file   Highest education level: High school graduate  Occupational History   Not on file  Tobacco Use   Smoking status: Former    Current packs/day: 0.50    Average packs/day: 0.5 packs/day for 48.0 years (24.0 ttl pk-yrs)    Types: Cigarettes    Passive exposure: Current   Smokeless tobacco: Never   Tobacco comments:    Smokes daily.    Vaping Use   Vaping status: Never Used  Substance and Sexual Activity   Alcohol use: Yes    Alcohol/week: 1.0 standard drink of alcohol    Types: 1 Standard drinks or equivalent per week    Comment: occasionally   Drug use: No   Sexual activity: Yes    Birth control/protection: Post-menopausal  Other Topics Concern   Not on file  Social History Narrative   Single, no children.   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    Social Drivers of Health   Tobacco Use: Medium Risk (04/27/2024)   Patient History    Smoking Tobacco Use: Former    Smokeless Tobacco Use: Never    Passive Exposure: Current  Physicist, Medical Strain: Low Risk (11/25/2023)   Overall Financial Resource Strain (CARDIA)    Difficulty of Paying Living Expenses: Not very hard  Food Insecurity: No Food Insecurity (01/21/2024)   Epic    Worried About Programme Researcher, Broadcasting/film/video in the Last Year: Never true    Ran Out of Food in the Last Year: Never true  Transportation Needs: No Transportation Needs (01/21/2024)   Epic    Lack of Transportation (Medical): No    Lack of Transportation (Non-Medical): No  Physical Activity: Sufficiently Active (05/31/2023)   Exercise Vital Sign    Days of Exercise per Week: 5 days    Minutes of Exercise per Session: 60 min  Stress: No Stress Concern Present (05/31/2023)   [employer] of Occupational Health - Occupational Stress Questionnaire    Feeling of Stress : Not at all  Social Connections: Socially Isolated (01/21/2024)   Social Connection and Isolation Panel    Frequency of  Communication with Friends and Family: Twice a week    Frequency of Social Gatherings with Friends and Family: Once a week    Attends Religious Services: Never    Database Administrator or Organizations: No    Attends Banker Meetings: Never    Marital Status: Never married  Intimate Partner Violence: Not At Risk (01/21/2024)   Epic    Fear of Current or Ex-Partner: No    Emotionally Abused: No    Physically Abused: No    Sexually Abused: No  Depression (PHQ2-9): Low Risk (05/06/2024)   Depression (PHQ2-9)    PHQ-2 Score: 0  Alcohol Screen: Low Risk (11/25/2023)   Alcohol  Screen    Last Alcohol Screening Score (AUDIT): 1  Housing: Low Risk (01/21/2024)   Epic    Unable to Pay for Housing in the Last Year: No    Number of Times Moved in the Last Year: 1    Homeless in the Last Year: No  Utilities: Not At Risk (01/21/2024)   Epic    Threatened with loss of utilities: No  Health Literacy: Adequate Health Literacy (05/31/2023)   B1300 Health Literacy    Frequency of need for help with medical instructions: Never    FAMILY HISTORY: Family History  Problem Relation Age of Onset   Breast cancer Sister 71   Hypertension Other        some of Pts brothers and sisters   Heart attack Neg Hx    Stroke Neg Hx    Diabetes Neg Hx    Heart disease Neg Hx     Review of Systems  Constitutional:  Negative for appetite change, chills, fatigue, fever and unexpected weight change.  HENT:   Negative for hearing loss, lump/mass and trouble swallowing.   Eyes:  Negative for eye problems and icterus.  Respiratory:  Negative for chest tightness, cough and shortness of breath.   Cardiovascular:  Negative for chest pain, leg swelling and palpitations.  Gastrointestinal:  Negative for abdominal distention, abdominal pain, constipation, diarrhea, nausea and vomiting.  Endocrine: Negative for hot flashes.  Genitourinary:  Negative for difficulty urinating.   Musculoskeletal:  Negative  for arthralgias.  Skin:  Negative for itching and rash.  Neurological:  Negative for dizziness, extremity weakness, headaches and numbness.  Hematological:  Negative for adenopathy. Does not bruise/bleed easily.  Psychiatric/Behavioral:  Negative for depression. The patient is not nervous/anxious.       PHYSICAL EXAMINATION   Onc Performance Status - 05/06/24 1333       ECOG Perf Status   ECOG Perf Status Restricted in physically strenuous activity but ambulatory and able to carry out work of a light or sedentary nature, e.g., light house work, office work      KPS SCALE   KPS % SCORE Able to carry on normal activity, minor s/s of disease         Vitals:   05/06/24 1332 05/06/24 1333  BP: (!) 145/56 (!) 149/58  Pulse: (!) 58   Resp: 17   Temp: 97.9 F (36.6 C)   SpO2: 99%      She appears well.  Left mastectomy scar appears well healed CTA bilaterally RRR No LE edema.  LABORATORY DATA:  CBC    Component Value Date/Time   WBC 5.9 05/06/2024 1250   RBC 3.40 (L) 05/06/2024 1250   HGB 9.5 (L) 05/06/2024 1250   HGB 10.2 (L) 03/26/2024 1100   HGB 13.3 06/27/2020 1354   HGB 12.4 05/07/2011 1356   HCT 29.8 (L) 05/06/2024 1250   HCT 40.3 06/27/2020 1354   HCT 37.8 05/07/2011 1356   PLT 159 05/06/2024 1250   PLT 226 03/26/2024 1100   PLT 287 06/27/2020 1354   MCV 87.6 05/06/2024 1250   MCV 85 06/27/2020 1354   MCV 84.3 05/07/2011 1356   MCH 27.9 05/06/2024 1250   MCHC 31.9 05/06/2024 1250   RDW 17.0 (H) 05/06/2024 1250   RDW 13.1 06/27/2020 1354   RDW 12.9 05/07/2011 1356   LYMPHSABS 2.5 05/06/2024 1250   LYMPHSABS 4.4 (H) 06/27/2020 1354   LYMPHSABS 5.1 (H) 05/07/2011 1356   MONOABS 0.3 05/06/2024 1250   MONOABS  0.5 05/07/2011 1356   EOSABS 0.1 05/06/2024 1250   EOSABS 0.1 06/27/2020 1354   BASOSABS 0.0 05/06/2024 1250   BASOSABS 0.0 06/27/2020 1354   BASOSABS 0.1 05/07/2011 1356    CMP     Component Value Date/Time   NA 139 05/06/2024 1250   NA  136 06/27/2020 1354   K 4.4 05/06/2024 1250   CL 103 05/06/2024 1250   CO2 23 05/06/2024 1250   GLUCOSE 90 05/06/2024 1250   BUN 30 (H) 05/06/2024 1250   BUN 15 06/27/2020 1354   CREATININE 1.77 (H) 05/06/2024 1250   CALCIUM  9.2 05/06/2024 1250   PROT 7.3 05/06/2024 1250   PROT 7.5 01/31/2021 1027   ALBUMIN  4.2 05/06/2024 1250   ALBUMIN  4.3 01/31/2021 1027   AST 15 05/06/2024 1250   ALT 6 05/06/2024 1250   ALKPHOS 83 05/06/2024 1250   BILITOT 0.6 05/06/2024 1250   GFRNONAA 29 (L) 05/06/2024 1250     ASSESSMENT and THERAPY PLAN:   Assessment and Plan Assessment & Plan  Malignant neoplasm of lower-inner quadrant of left breast, estrogen receptor negative Post-surgical status with RCB -II. No indication for adj post mastectomy radiation - Given triple neg biology, we discussed adj capecitabine . - She is here for follow up on adj capecitabine , planned 6-8 cycles. - She is tolerating it remarkably well. No complaints at all. - Continue oral chemotherapy regimen (two weeks on, one week off) for eight cycles. - Scheduled follow-up in three weeks for assessment and cycle management. - Transition to follow-up every six months after adj xeloda  with MRD testing  All questions were answered. The patient knows to call the clinic with any problems, questions or concerns. We can certainly see the patient much sooner if necessary.  Total encounter time:30 minutes*in face-to-face visit time, chart review, lab review, care coordination, order entry, and documentation of the encounter time.  *Total Encounter Time as defined by the Centers for Medicare and Medicaid Services includes, in addition to the face-to-face time of a patient visit (documented in the note above) non-face-to-face time: obtaining and reviewing outside history, ordering and reviewing medications, tests or procedures, care coordination (communications with other health care professionals or caregivers) and documentation in the  medical record.

## 2024-05-07 ENCOUNTER — Other Ambulatory Visit: Payer: Self-pay

## 2024-05-07 ENCOUNTER — Encounter: Payer: Self-pay | Admitting: Hematology and Oncology

## 2024-05-11 ENCOUNTER — Other Ambulatory Visit: Payer: Self-pay

## 2024-05-11 ENCOUNTER — Other Ambulatory Visit (HOSPITAL_COMMUNITY): Payer: Self-pay

## 2024-05-11 NOTE — Progress Notes (Signed)
 Specialty Pharmacy Refill Coordination Note  Spoke with [name]  [name] is a 79 y.o. female contacted today regarding refills of specialty medication(s) Capecitabine  (XELODA )  Doses on hand: 0  Patient requested: Delivery   Delivery date: 05/15/24   Verified address: [address] [place] [address] [employer]  Medication will be filled on 05/14/24

## 2024-05-13 ENCOUNTER — Other Ambulatory Visit: Payer: Self-pay | Admitting: Internal Medicine

## 2024-05-14 ENCOUNTER — Other Ambulatory Visit: Payer: Self-pay

## 2024-05-26 ENCOUNTER — Inpatient Hospital Stay: Admitting: Pharmacist

## 2024-05-26 ENCOUNTER — Inpatient Hospital Stay

## 2024-05-28 ENCOUNTER — Inpatient Hospital Stay: Attending: Hematology and Oncology

## 2024-05-28 ENCOUNTER — Inpatient Hospital Stay: Admitting: Adult Health

## 2024-06-04 ENCOUNTER — Ambulatory Visit: Payer: Medicare HMO

## 2024-07-03 ENCOUNTER — Ambulatory Visit: Payer: Self-pay | Admitting: Cardiovascular Disease

## 2024-10-26 ENCOUNTER — Ambulatory Visit: Admitting: Internal Medicine
